# Patient Record
Sex: Female | Born: 1945 | Race: Black or African American | Hispanic: No | Marital: Single | Smoking: Current some day smoker
Health system: Southern US, Community
[De-identification: ages and names within clinical notes are randomized; demographics above are authoritative.]

## PROBLEM LIST (undated history)

## (undated) DIAGNOSIS — G459 Transient cerebral ischemic attack, unspecified: Secondary | ICD-10-CM

## (undated) DIAGNOSIS — E538 Deficiency of other specified B group vitamins: Secondary | ICD-10-CM

## (undated) DIAGNOSIS — D509 Iron deficiency anemia, unspecified: Secondary | ICD-10-CM

## (undated) DIAGNOSIS — N289 Disorder of kidney and ureter, unspecified: Secondary | ICD-10-CM

## (undated) DIAGNOSIS — I739 Peripheral vascular disease, unspecified: Secondary | ICD-10-CM

## (undated) DIAGNOSIS — I4892 Unspecified atrial flutter: Secondary | ICD-10-CM

## (undated) DIAGNOSIS — Z87891 Personal history of nicotine dependence: Secondary | ICD-10-CM

## (undated) DIAGNOSIS — I429 Cardiomyopathy, unspecified: Secondary | ICD-10-CM

## (undated) DIAGNOSIS — K922 Gastrointestinal hemorrhage, unspecified: Secondary | ICD-10-CM

## (undated) DIAGNOSIS — I340 Nonrheumatic mitral (valve) insufficiency: Secondary | ICD-10-CM

## (undated) DIAGNOSIS — I10 Essential (primary) hypertension: Secondary | ICD-10-CM

## (undated) DIAGNOSIS — E785 Hyperlipidemia, unspecified: Secondary | ICD-10-CM

## (undated) DIAGNOSIS — N183 Chronic kidney disease, stage 3 unspecified: Secondary | ICD-10-CM

## (undated) DIAGNOSIS — D649 Anemia, unspecified: Secondary | ICD-10-CM

## (undated) DIAGNOSIS — I208 Other forms of angina pectoris: Secondary | ICD-10-CM

## (undated) DIAGNOSIS — I2089 Other forms of angina pectoris: Secondary | ICD-10-CM

## (undated) DIAGNOSIS — F32A Depression, unspecified: Secondary | ICD-10-CM

## (undated) HISTORY — PX: BREAST REDUCTION SURGERY: SHX8

## (undated) HISTORY — PX: CARDIAC CATHETERIZATION: SHX172

---

## 1989-11-18 DIAGNOSIS — E669 Obesity, unspecified: Secondary | ICD-10-CM | POA: Insufficient documentation

## 1990-08-18 DIAGNOSIS — K209 Esophagitis, unspecified without bleeding: Secondary | ICD-10-CM | POA: Diagnosis present

## 1995-05-01 DIAGNOSIS — E539 Vitamin B deficiency, unspecified: Secondary | ICD-10-CM | POA: Insufficient documentation

## 2001-03-24 DIAGNOSIS — M1990 Unspecified osteoarthritis, unspecified site: Secondary | ICD-10-CM | POA: Insufficient documentation

## 2003-03-29 DIAGNOSIS — I10 Essential (primary) hypertension: Secondary | ICD-10-CM | POA: Diagnosis present

## 2005-07-10 DIAGNOSIS — I739 Peripheral vascular disease, unspecified: Secondary | ICD-10-CM | POA: Diagnosis present

## 2005-08-12 ENCOUNTER — Emergency Department: Payer: Self-pay | Admitting: Emergency Medicine

## 2009-03-15 DIAGNOSIS — D500 Iron deficiency anemia secondary to blood loss (chronic): Secondary | ICD-10-CM | POA: Diagnosis present

## 2009-04-06 DIAGNOSIS — R768 Other specified abnormal immunological findings in serum: Secondary | ICD-10-CM | POA: Insufficient documentation

## 2012-09-14 ENCOUNTER — Encounter: Payer: Self-pay | Admitting: Internal Medicine

## 2012-09-22 ENCOUNTER — Encounter: Payer: Self-pay | Admitting: Internal Medicine

## 2012-10-06 DIAGNOSIS — N62 Hypertrophy of breast: Secondary | ICD-10-CM | POA: Insufficient documentation

## 2012-10-06 DIAGNOSIS — M542 Cervicalgia: Secondary | ICD-10-CM | POA: Insufficient documentation

## 2012-12-01 DIAGNOSIS — J309 Allergic rhinitis, unspecified: Secondary | ICD-10-CM | POA: Insufficient documentation

## 2013-07-27 ENCOUNTER — Emergency Department: Payer: Self-pay | Admitting: Emergency Medicine

## 2014-09-27 ENCOUNTER — Emergency Department: Payer: Medicare PPO

## 2014-09-27 ENCOUNTER — Encounter: Payer: Self-pay | Admitting: Emergency Medicine

## 2014-09-27 ENCOUNTER — Emergency Department
Admission: EM | Admit: 2014-09-27 | Discharge: 2014-09-27 | Disposition: A | Discharge status: Home / Self Care | Payer: Medicare PPO | Attending: Emergency Medicine | Admitting: Emergency Medicine

## 2014-09-27 DIAGNOSIS — R609 Edema, unspecified: Secondary | ICD-10-CM | POA: Diagnosis not present

## 2014-09-27 DIAGNOSIS — Z720 Tobacco use: Secondary | ICD-10-CM | POA: Insufficient documentation

## 2014-09-27 DIAGNOSIS — I10 Essential (primary) hypertension: Secondary | ICD-10-CM | POA: Diagnosis not present

## 2014-09-27 DIAGNOSIS — M79671 Pain in right foot: Secondary | ICD-10-CM | POA: Diagnosis present

## 2014-09-27 HISTORY — DX: Peripheral vascular disease, unspecified: I73.9

## 2014-09-27 HISTORY — DX: Anemia, unspecified: D64.9

## 2014-09-27 HISTORY — DX: Disorder of kidney and ureter, unspecified: N28.9

## 2014-09-27 HISTORY — DX: Essential (primary) hypertension: I10

## 2014-09-27 MED ORDER — TRAMADOL HCL 50 MG PO TABS: 50.0000 mg | ORAL_TABLET | Freq: Four times a day (QID) | ORAL | Status: AC | PRN

## 2014-09-27 NOTE — Discharge Instructions (Signed)
Peripheral Edema °You have swelling in your legs (peripheral edema). This swelling is due to excess accumulation of salt and water in your body. Edema may be a sign of heart, kidney or liver disease, or a side effect of a medication. It may also be due to problems in the leg veins. Elevating your legs and using special support stockings may be very helpful, if the cause of the swelling is due to poor venous circulation. Avoid long periods of standing, whatever the cause. °Treatment of edema depends on identifying the cause. Chips, pretzels, pickles and other salty foods should be avoided. Restricting salt in your diet is almost always needed. Water pills (diuretics) are often used to remove the excess salt and water from your body via urine. These medicines prevent the kidney from reabsorbing sodium. This increases urine flow. °Diuretic treatment may also result in lowering of potassium levels in your body. Potassium supplements may be needed if you have to use diuretics daily. Daily weights can help you keep track of your progress in clearing your edema. You should call your caregiver for follow up care as recommended. °SEEK IMMEDIATE MEDICAL CARE IF:  °· You have increased swelling, pain, redness, or heat in your legs. °· You develop shortness of breath, especially when lying down. °· You develop chest or abdominal pain, weakness, or fainting. °· You have a fever. °Document Released: 04/18/2004 Document Revised: 06/03/2011 Document Reviewed: 03/29/2009 °ExitCare® Patient Information ©2015 [employer]. This information is not intended to replace advice given to you by your health care provider. Make sure you discuss any questions you have with your health care provider. ° °

## 2014-09-27 NOTE — ED Provider Notes (Signed)
[facility] Emergency Department Provider Note  Time seen: 2:56 PM  I have reviewed the triage vital signs and the nursing notes.   HISTORY  Chief Complaint Foot Pain    HPI Jody Lowe is a 69 y.o. female who presents the emergency department with 1 week of right lower extremity swelling and pain. According to the patient for the past one week she has noticed increased swelling to her calf and foot. She is also noted pain in her groin behind her right knee and down her calf. Patient denies any chest pain or shortness of breath. Denies any history of blood clots in the past. The patient does have a history of peripheral artery disease, [facility]. She notes she did have a bypass in the right lower extremity years ago.  Describes her pain as moderate.    Past Medical History  Diagnosis Date  . Hypertension   . Renal disorder   . Peripheral artery disease   . Anemia     There are no active problems to display for this patient.   History reviewed. No pertinent past surgical history.  No current outpatient prescriptions on file.  Allergies Plavix  History reviewed. No pertinent family history.  Social History History  Substance Use Topics  . Smoking status: Current Every Day Smoker  . Smokeless tobacco: Not on file  . Alcohol Use: No    Review of Systems Constitutional: Negative for fever. Cardiovascular: Negative for chest pain. Respiratory: Negative for shortness of breath. Gastrointestinal: Negative for abdominal pain Musculoskeletal: Negative for back pain. Skin: Negative for rash.  10-point ROS otherwise negative.  ____________________________________________   PHYSICAL EXAM:  VITAL SIGNS: ED Triage Vitals  Enc Vitals Group     BP 09/27/14 1417 149/68 mmHg     Pulse Rate 09/27/14 1417 87     Resp 09/27/14 1417 20     Temp 09/27/14 1417 97.5 F (36.4 C)     Temp Source 09/27/14 1417 Oral     SpO2 09/27/14 1417  100 %     Weight 09/27/14 1417 231 lb (104.781 kg)     Height 09/27/14 1417 5\' 6"  (1.676 m)     Head Cir --      Peak Flow --      Pain Score 09/27/14 1410 5     Pain Loc --      Pain Edu? --      Excl. in GC? --     Constitutional: Alert and oriented. Well appearing and in no distress. ENT   Mouth/Throat: Mucous membranes are moist. Cardiovascular: Normal rate, regular rhythm. No murmur Respiratory: Normal respiratory effort without tachypnea nor retractions. Breath sounds are clear and equal bilaterally. No wheezes/rales/rhonchi. Gastrointestinal: Soft and nontender. No distention.   Musculoskeletal: 2+ pedal edema, and right lower extremity edema extending up to the knee. Moderate popliteal fossa tenderness to palpation, mild right inguinal area tenderness to palpation. No erythema noted. Extremity is warm, with normal sensation. Normal color. Neurologic:  Normal speech and language. No gross focal neurologic deficits are appreciated. Speech is normal. Skin:  Skin is warm, dry and intact.  Psychiatric: Mood and affect are normal. Speech and behavior are normal.   ____________________________________________   RADIOLOGY  Ultrasound shows no definitive evidence of DVT.  US Venous Img Lower Unilateral Right (Final result) Result time: 09/27/14 [id number]:24   Final result by Rad Results In Interface (09/27/14 29:56:[id number]:27:24)   Narrative:   CLINICAL DATA: Right  lower extremity edema and pain for 2 weeks.  EXAM: Right LOWER EXTREMITY VENOUS DOPPLER ULTRASOUND  TECHNIQUE: Gray-scale sonography with graded compression, as well as color Doppler and duplex ultrasound were performed to evaluate the lower extremity deep venous systems from the level of the common femoral vein and including the common femoral, femoral, profunda femoral, popliteal and calf veins including the posterior tibial, peroneal and gastrocnemius veins when visible. The superficial great saphenous vein was also  interrogated. Spectral Doppler was utilized to evaluate flow at rest and with distal augmentation maneuvers in the common femoral, femoral and popliteal veins.  COMPARISON: None.  FINDINGS: Contralateral Common Femoral Vein: Respiratory phasicity is normal and symmetric with the symptomatic side. No evidence of thrombus. Normal compressibility.  Common Femoral Vein: No evidence of thrombus. Normal compressibility, respiratory phasicity and response to augmentation.  Saphenofemoral Junction: No evidence of thrombus. Normal compressibility and flow on color Doppler imaging.  Profunda Femoral Vein: No evidence of thrombus. Normal compressibility and flow on color Doppler imaging.  Femoral Vein: No evidence of thrombus. Normal compressibility, respiratory phasicity and response to augmentation.  Popliteal Vein: No evidence of thrombus. Normal compressibility, respiratory phasicity and response to augmentation.  Calf Veins: No evidence of thrombus. Normal compressibility and flow on color Doppler imaging. However, peroneal veins not well visualized.  Superficial Great Saphenous Vein: No evidence of thrombus. Normal compressibility and flow on color Doppler imaging.  Venous Reflux: None.  Other Findings: None.  IMPRESSION: No definite evidence of deep venous thrombosis seen in right lower extremity.      ____________________________________________    INITIAL IMPRESSION / ASSESSMENT AND PLAN / ED COURSE  Pertinent labs & imaging results that were available during my care of the patient were reviewed by me and considered in my medical decision making (see chart for details).  Patient with 1 week of right lower extremity swelling. Concern for possible DVT. We'll proceed with ultrasound. Patient denies any chest pain, tightness, pressure, shortness of breath. Extremity is warm with normal sensation and normal color do not suspect any arterial issue.  Ultrasound shows  no DVT. Patient definitely has swelling to the right lower extremity, no signs of cellulitis. We will place the patient on Ultram as needed for discomfort and have the patient follow-up with her vascular surgeon and primary care doctor this week. The patient is agreeable to this plan. I discussed strict return precautions for worsening pain, swelling, or any chest pain or tightness. The patient is agreeable.  Doppler shows strong pulses on my exam, difficult to palpate a pulse given edema.     ____________________________________________   FINAL CLINICAL IMPRESSION(S) / ED DIAGNOSES  Right lower extremity edema   Minna Antis, MD 09/27/14 1649

## 2014-09-27 NOTE — ED Notes (Signed)
Pt to ed with c/o right foot pain, swelling and pain behind right knee.  Pt states she has PAD,  Called her md and no appt visit.  Pt reports they told her to come to ed and be eval.

## 2014-10-19 DIAGNOSIS — G458 Other transient cerebral ischemic attacks and related syndromes: Secondary | ICD-10-CM | POA: Insufficient documentation

## 2014-10-19 DIAGNOSIS — R42 Dizziness and giddiness: Secondary | ICD-10-CM | POA: Insufficient documentation

## 2015-05-09 DIAGNOSIS — F17200 Nicotine dependence, unspecified, uncomplicated: Secondary | ICD-10-CM | POA: Diagnosis present

## 2016-02-20 DIAGNOSIS — Z9181 History of falling: Secondary | ICD-10-CM | POA: Insufficient documentation

## 2016-06-27 DIAGNOSIS — G252 Other specified forms of tremor: Secondary | ICD-10-CM | POA: Insufficient documentation

## 2016-06-27 DIAGNOSIS — M810 Age-related osteoporosis without current pathological fracture: Secondary | ICD-10-CM | POA: Diagnosis present

## 2016-08-23 DIAGNOSIS — G5600 Carpal tunnel syndrome, unspecified upper limb: Secondary | ICD-10-CM | POA: Insufficient documentation

## 2017-04-21 DIAGNOSIS — M25519 Pain in unspecified shoulder: Secondary | ICD-10-CM | POA: Insufficient documentation

## 2017-04-21 DIAGNOSIS — M549 Dorsalgia, unspecified: Secondary | ICD-10-CM | POA: Insufficient documentation

## 2017-05-08 DIAGNOSIS — R0981 Nasal congestion: Secondary | ICD-10-CM | POA: Insufficient documentation

## 2017-05-08 DIAGNOSIS — H9319 Tinnitus, unspecified ear: Secondary | ICD-10-CM | POA: Insufficient documentation

## 2017-06-23 DIAGNOSIS — E785 Hyperlipidemia, unspecified: Secondary | ICD-10-CM | POA: Insufficient documentation

## 2019-05-01 ENCOUNTER — Emergency Department: Payer: Medicare Other

## 2019-05-01 ENCOUNTER — Encounter: Payer: Self-pay | Admitting: Emergency Medicine

## 2019-05-01 ENCOUNTER — Observation Stay: Payer: Medicare Other

## 2019-05-01 ENCOUNTER — Other Ambulatory Visit: Payer: Self-pay

## 2019-05-01 ENCOUNTER — Observation Stay
Admission: EM | Admit: 2019-05-01 | Discharge: 2019-05-03 | Disposition: A | Discharge status: Home / Self Care | Payer: Medicare Other | Attending: Internal Medicine | Admitting: Internal Medicine

## 2019-05-01 DIAGNOSIS — Z20822 Contact with and (suspected) exposure to covid-19: Secondary | ICD-10-CM | POA: Diagnosis not present

## 2019-05-01 DIAGNOSIS — F329 Major depressive disorder, single episode, unspecified: Secondary | ICD-10-CM | POA: Diagnosis not present

## 2019-05-01 DIAGNOSIS — F17200 Nicotine dependence, unspecified, uncomplicated: Secondary | ICD-10-CM | POA: Diagnosis not present

## 2019-05-01 DIAGNOSIS — D500 Iron deficiency anemia secondary to blood loss (chronic): Secondary | ICD-10-CM | POA: Diagnosis not present

## 2019-05-01 DIAGNOSIS — Z791 Long term (current) use of non-steroidal anti-inflammatories (NSAID): Secondary | ICD-10-CM | POA: Insufficient documentation

## 2019-05-01 DIAGNOSIS — R2689 Other abnormalities of gait and mobility: Secondary | ICD-10-CM | POA: Insufficient documentation

## 2019-05-01 DIAGNOSIS — E876 Hypokalemia: Secondary | ICD-10-CM | POA: Diagnosis present

## 2019-05-01 DIAGNOSIS — G459 Transient cerebral ischemic attack, unspecified: Principal | ICD-10-CM | POA: Diagnosis present

## 2019-05-01 DIAGNOSIS — Z7982 Long term (current) use of aspirin: Secondary | ICD-10-CM | POA: Insufficient documentation

## 2019-05-01 DIAGNOSIS — Z79899 Other long term (current) drug therapy: Secondary | ICD-10-CM | POA: Insufficient documentation

## 2019-05-01 DIAGNOSIS — I639 Cerebral infarction, unspecified: Secondary | ICD-10-CM | POA: Diagnosis present

## 2019-05-01 DIAGNOSIS — I10 Essential (primary) hypertension: Secondary | ICD-10-CM | POA: Diagnosis not present

## 2019-05-01 DIAGNOSIS — I739 Peripheral vascular disease, unspecified: Secondary | ICD-10-CM | POA: Diagnosis not present

## 2019-05-01 LAB — CBC
HCT: 35.9 % — ABNORMAL LOW (ref 36.0–46.0)
HCT: 32 % — ABNORMAL LOW (ref 36.0–46.0)
Hemoglobin: 11.5 g/dL — ABNORMAL LOW (ref 12.0–15.0)
Hemoglobin: 10.6 g/dL — ABNORMAL LOW (ref 12.0–15.0)
MCH: 27.5 pg (ref 26.0–34.0)
MCH: 27.9 pg (ref 26.0–34.0)
MCHC: 32 g/dL (ref 30.0–36.0)
MCHC: 33.1 g/dL (ref 30.0–36.0)
MCV: 85.9 fL (ref 80.0–100.0)
MCV: 84.2 fL (ref 80.0–100.0)
Platelets: 497 10*3/uL — ABNORMAL HIGH (ref 150–400)
Platelets: 459 10*3/uL — ABNORMAL HIGH (ref 150–400)
RBC: 4.18 MIL/uL (ref 3.87–5.11)
RBC: 3.8 MIL/uL — ABNORMAL LOW (ref 3.87–5.11)
RDW: 14.8 % (ref 11.5–15.5)
RDW: 14.3 % (ref 11.5–15.5)
WBC: 5.9 10*3/uL (ref 4.0–10.5)
WBC: 6.5 10*3/uL (ref 4.0–10.5)
nRBC: 0 % (ref 0.0–0.2)
nRBC: 0 % (ref 0.0–0.2)

## 2019-05-01 LAB — URINE DRUG SCREEN, QUALITATIVE (ARMC ONLY)
Amphetamines, Ur Screen: NOT DETECTED
Barbiturates, Ur Screen: NOT DETECTED
Benzodiazepine, Ur Scrn: NOT DETECTED
Cannabinoid 50 Ng, Ur ~~LOC~~: NOT DETECTED
Cocaine Metabolite,Ur ~~LOC~~: NOT DETECTED
MDMA (Ecstasy)Ur Screen: NOT DETECTED
Methadone Scn, Ur: NOT DETECTED
Opiate, Ur Screen: NOT DETECTED
Phencyclidine (PCP) Ur S: NOT DETECTED
Tricyclic, Ur Screen: POSITIVE — AB

## 2019-05-01 LAB — COMPREHENSIVE METABOLIC PANEL
ALT: 8 U/L (ref 0–44)
AST: 10 U/L — ABNORMAL LOW (ref 15–41)
Albumin: 2.7 g/dL — ABNORMAL LOW (ref 3.5–5.0)
Alkaline Phosphatase: 86 U/L (ref 38–126)
Anion gap: 8 (ref 5–15)
BUN: 10 mg/dL (ref 8–23)
CO2: 21 mmol/L — ABNORMAL LOW (ref 22–32)
Calcium: 7.5 mg/dL — ABNORMAL LOW (ref 8.9–10.3)
Chloride: 107 mmol/L (ref 98–111)
Creatinine, Ser: 0.97 mg/dL (ref 0.44–1.00)
GFR calc Af Amer: >60 mL/min (ref >60)
GFR calc non Af Amer: 57 mL/min — ABNORMAL LOW (ref >60)
Glucose, Bld: 78 mg/dL (ref 70–99)
Potassium: 2.7 mmol/L — CL (ref 3.5–5.1)
Sodium: 136 mmol/L (ref 135–145)
Total Bilirubin: 0.5 mg/dL (ref 0.3–1.2)
Total Protein: 5.6 g/dL — ABNORMAL LOW (ref 6.5–8.1)

## 2019-05-01 LAB — CREATININE, SERUM
Creatinine, Ser: 0.92 mg/dL (ref 0.44–1.00)
GFR calc Af Amer: >60 mL/min (ref >60)
GFR calc non Af Amer: >60 mL/min (ref >60)

## 2019-05-01 LAB — TROPONIN I (HIGH SENSITIVITY)
Troponin I (High Sensitivity): 5 ng/L (ref <18)
Troponin I (High Sensitivity): 3 ng/L (ref <18)
Troponin I (High Sensitivity): 4 ng/L (ref <18)

## 2019-05-01 LAB — URINALYSIS, ROUTINE W REFLEX MICROSCOPIC
Bilirubin Urine: NEGATIVE
Glucose, UA: NEGATIVE mg/dL
Hgb urine dipstick: NEGATIVE
Ketones, ur: NEGATIVE mg/dL
Leukocytes,Ua: NEGATIVE
Nitrite: NEGATIVE
Protein, ur: NEGATIVE mg/dL
Specific Gravity, Urine: 1.005 (ref 1.005–1.030)
pH: 7 (ref 5.0–8.0)

## 2019-05-01 LAB — APTT: aPTT: 33 seconds (ref 24–36)

## 2019-05-01 LAB — DIFFERENTIAL
Abs Immature Granulocytes: 0.03 10*3/uL (ref 0.00–0.07)
Basophils Absolute: 0.1 10*3/uL (ref 0.0–0.1)
Basophils Relative: 1 %
Eosinophils Absolute: 0.2 10*3/uL (ref 0.0–0.5)
Eosinophils Relative: 3 %
Immature Granulocytes: 1 %
Lymphocytes Relative: 20 %
Lymphs Abs: 1.2 10*3/uL (ref 0.7–4.0)
Monocytes Absolute: 0.4 10*3/uL (ref 0.1–1.0)
Monocytes Relative: 7 %
Neutro Abs: 4 10*3/uL (ref 1.7–7.7)
Neutrophils Relative %: 68 %

## 2019-05-01 LAB — MAGNESIUM: Magnesium: 1.6 mg/dL — ABNORMAL LOW (ref 1.7–2.4)

## 2019-05-01 LAB — PROTIME-INR
INR: 1.1 (ref 0.8–1.2)
Prothrombin Time: 13.6 seconds (ref 11.4–15.2)

## 2019-05-01 LAB — ETHANOL: Alcohol, Ethyl (B): <10 mg/dL (ref <10)

## 2019-05-01 MED ORDER — ROSUVASTATIN CALCIUM 20 MG PO TABS
40.0000 mg | ORAL_TABLET | Freq: Every day | ORAL | Status: DC
  Administered 2019-05-01 – 2019-05-02 (×2): 40 mg via ORAL
  Filled 2019-05-01 (×2): qty 2
  Filled 2019-05-01: qty 4
  Filled 2019-05-01: qty 2
  Filled 2019-05-01: qty 4

## 2019-05-01 MED ORDER — MAGNESIUM SULFATE IN D5W 1-5 GM/100ML-% IV SOLN
1.0000 g | Freq: Once | INTRAVENOUS | Status: AC
  Administered 2019-05-02: 1 g via INTRAVENOUS
  Filled 2019-05-01: qty 100

## 2019-05-01 MED ORDER — STROKE: EARLY STAGES OF RECOVERY BOOK: Freq: Once | Status: AC

## 2019-05-01 MED ORDER — HEPARIN SODIUM (PORCINE) 5000 UNIT/ML IJ SOLN
5000.0000 [IU] | Freq: Three times a day (TID) | INTRAMUSCULAR | Status: DC
  Administered 2019-05-01 – 2019-05-03 (×6): 5000 [IU] via SUBCUTANEOUS
  Filled 2019-05-01 (×6): qty 1

## 2019-05-01 MED ORDER — POTASSIUM CHLORIDE 10 MEQ/100ML IV SOLN
10.0000 meq | Freq: Once | INTRAVENOUS | Status: AC
  Administered 2019-05-01: 10 meq via INTRAVENOUS
  Filled 2019-05-01: qty 100

## 2019-05-01 MED ORDER — ACETAMINOPHEN 325 MG PO TABS
650.0000 mg | ORAL_TABLET | ORAL | Status: DC | PRN
  Administered 2019-05-02 (×2): 650 mg via ORAL
  Filled 2019-05-01 (×2): qty 2

## 2019-05-01 MED ORDER — ACETAMINOPHEN 650 MG RE SUPP: 650.0000 mg | RECTAL | Status: DC | PRN

## 2019-05-01 MED ORDER — SODIUM CHLORIDE 0.9 % IV SOLN: INTRAVENOUS | Status: DC

## 2019-05-01 MED ORDER — PANTOPRAZOLE SODIUM 40 MG IV SOLR
40.0000 mg | INTRAVENOUS | Status: DC
  Administered 2019-05-01 – 2019-05-02 (×2): 40 mg via INTRAVENOUS
  Filled 2019-05-01 (×2): qty 40

## 2019-05-01 MED ORDER — ACETAMINOPHEN 160 MG/5ML PO SOLN
650.0000 mg | ORAL | Status: DC | PRN
  Filled 2019-05-01: qty 20.3

## 2019-05-01 MED ORDER — SODIUM CHLORIDE 0.9 % IV SOLN
INTRAVENOUS | Status: DC | PRN
  Administered 2019-05-01 – 2019-05-02 (×2): 250 mL via INTRAVENOUS

## 2019-05-01 MED ORDER — ASPIRIN EC 81 MG PO TBEC
81.0000 mg | DELAYED_RELEASE_TABLET | Freq: Every day | ORAL | Status: DC
  Administered 2019-05-01 – 2019-05-03 (×3): 81 mg via ORAL
  Filled 2019-05-01 (×3): qty 1

## 2019-05-01 NOTE — Progress Notes (Signed)
PT Cancellation Note  Patient Details Name: [name] MRN: [id number] DOB: [birth year]   Cancelled Treatment:    Reason Eval/Treat Not Completed: Patient not medically ready(PT order received, chart reviewed, PT held due to potassium being critical low 2.7. Will re-attempt eval at later time/date when patient is medically appropiate.)  Luretha Murphy. Ilsa Iha, PT, DPT 05/01/19, 3:02 PM

## 2019-05-01 NOTE — Assessment & Plan Note (Addendum)
Pt needs medical optimization with tobacco cessation and lipid panel eval and antiplatelet therapy in addition to asa as she is allergic to plavix. . Plan upon discharge plan.

## 2019-05-01 NOTE — Plan of Care (Signed)
Pt has no deficits currently and is planning on stop smoking. Will continue to monitor.

## 2019-05-01 NOTE — ED Triage Notes (Signed)
First Nurse: Pt to registration via wheelchair with reports of "my left hand will not do what my mind is telling it to do".  Pt reports symptoms started on Monday.  Pt also reports trouble with her balance and a headache, also starting on Monday.

## 2019-05-01 NOTE — Assessment & Plan Note (Signed)
counseled pt extensively on tobacco cessation and ongoing risk esp with her health history of PAD. Pt to cont asa and statin therapy.

## 2019-05-01 NOTE — Assessment & Plan Note (Signed)
Pt denies any bleeding and has anemia history as follow.  Most Recent 05/01/2019 05/01/2019               Cbc   HCT 32.0 (05/01/2019) 32.0 35.9     Hemoglobin 10.6 (05/01/2019) 10.6 11.5     MCV 84.2 (05/01/2019) 84.2 85.9     RDW 14.3 (05/01/2019) 14.3 14.8          We will start iv ppi and guaiac stools.

## 2019-05-01 NOTE — ED Notes (Signed)
RN is waiting for medication from pharmacy to be verified and sent to ED.

## 2019-05-01 NOTE — H&P (Signed)
History and Physical    MARKETA MIDKIFF MVE:[id number] DOB: 05-24-1945 DOA: 05/01/2019   Patient coming from: Home / has pcp  Chief Complaint: left forearm weakness.  HPI: Jody Lowe is a 74 y.o. female with medical history significant of HTN, anemia, tobacco abuse and PAD seen in ed for left forearm weakness that started on Monday and since then has remained the same. No variation pattern or worsening.  The patient woke up with this symptom and felt that she probably slept on her arm wrong, but has persisted since then.  She feels like her hand will not do what she wanted to do.  She denies any other weakness, any numbness, difficulty with speaking or finding words, or any facial droop.  She has no prior history of a stroke.  ED Course:  Blood pressure (!) 159/69, pulse 75, temperature 98.2 F (36.8 C), temperature source Oral, resp. rate 18, height 5\' 6"  (1.676 m), weight 86.2 kg, SpO2 100 %. Pt is alert awake and oriented and gives her history , she lives at home.   Review of Systems: As per HPI otherwise 10 point review of systems negative.   Past Medical History:  Diagnosis Date  . Anemia   . Hypertension   . Peripheral artery disease (HCC)   . Renal disorder     History reviewed. No pertinent surgical history.   reports that she has been smoking. She does not have any smokeless tobacco history on file. She reports that she does not drink alcohol or use drugs.  Allergies  Allergen Reactions  . Gabapentin Other (See Comments)    Nervous, shaky  . Plavix [Clopidogrel Bisulfate] Rash    Family History: Dad: cancer history. Mom: heart problems.   Prior to Admission medications   Not on File    Physical Exam: Vitals:   05/01/19 1400 05/01/19 1430 05/01/19 1500 05/01/19 1541  BP: (!) 149/64 (!) 120/52 (!) 160/63 (!) 159/69  Pulse: 66 74 73 75  Resp: 18 18 18 18   Temp:    98.2 F (36.8 C)  TempSrc:    Oral  SpO2: 97% 100% 100% 100%  Weight:      Height:            Constitutional: NAD, calm, comfortable Vitals:   05/01/19 1400 05/01/19 1430 05/01/19 1500 05/01/19 1541  BP: (!) 149/64 (!) 120/52 (!) 160/63 (!) 159/69  Pulse: 66 74 73 75  Resp: 18 18 18 18   Temp:    98.2 F (36.8 C)  TempSrc:    Oral  SpO2: 97% 100% 100% 100%  Weight:      Height:       Eyes: PERRL, lids and conjunctivae normal ENMT: Mucous membranes are moist. Posterior pharynx clear of any exudate or lesions.Normal dentition.  Neck: normal, supple, no masses, no thyromegaly Respiratory: clear to auscultation bilaterally, no wheezing, no crackles. Normal respiratory effort. No accessory muscle use.  Cardiovascular: Regular rate and rhythm, no murmurs / rubs / gallops. No extremity edema. 2+ pedal pulses. No carotid bruits.  Abdomen: no tenderness, no masses palpated. No hepatosplenomegaly. Bowel sounds positive.  Musculoskeletal: no clubbing / cyanosis. No joint deformity upper and lower extremities. Good ROM, no contractures. Normal muscle tone.  Skin: no rashes, lesions, ulcers. No induration Neurologic: CN 2-12 grossly intact. Sensation intact, DTR normal. Strength 5/5 in all 4.  Psychiatric: Normal judgment and insight. Alert and oriented x 3. Normal mood.   Labs on Admission: I have personally reviewed  following labs and imaging studies  CBC: Recent Labs  Lab 05/01/19 1155 05/01/19 1407  WBC 5.9 6.5  NEUTROABS 4.0  --   HGB 11.5* 10.6*  HCT 35.9* 32.0*  MCV 85.9 84.2  PLT 497* 161*   Basic Metabolic Panel: Recent Labs  Lab 05/01/19 1315 05/01/19 1407  NA 136  --   K 2.7*  --   CL 107  --   CO2 21*  --   GLUCOSE 78  --   BUN 10  --   CREATININE 0.97 0.92  CALCIUM 7.5*  --   MG  --  1.6*   GFR: Estimated Creatinine Clearance: 58.5 mL/min (by C-G formula based on SCr of 0.92 mg/dL). Liver Function Tests: Recent Labs  Lab 05/01/19 1315  AST 10*  ALT 8  ALKPHOS 86  BILITOT 0.5  PROT 5.6*  ALBUMIN 2.7*   No results for input(s): LIPASE,  AMYLASE in the last 168 hours. No results for input(s): AMMONIA in the last 168 hours. Coagulation Profile: Recent Labs  Lab 05/01/19 1315  INR 1.1   Cardiac Enzymes: No results for input(s): CKTOTAL, CKMB, CKMBINDEX, TROPONINI in the last 168 hours. BNP (last 3 results) No results for input(s): PROBNP in the last 8760 hours. HbA1C: No results for input(s): HGBA1C in the last 72 hours. CBG: No results for input(s): GLUCAP in the last 168 hours. Lipid Profile: No results for input(s): CHOL, HDL, LDLCALC, TRIG, CHOLHDL, LDLDIRECT in the last 72 hours. Thyroid Function Tests: No results for input(s): TSH, T4TOTAL, FREET4, T3FREE, THYROIDAB in the last 72 hours. Anemia Panel: No results for input(s): VITAMINB12, FOLATE, FERRITIN, TIBC, IRON, RETICCTPCT in the last 72 hours. Urine analysis:    Component Value Date/Time   COLORURINE YELLOW (A) 05/01/2019 1317   APPEARANCEUR CLEAR (A) 05/01/2019 1317   LABSPEC 1.005 05/01/2019 1317   PHURINE 7.0 05/01/2019 1317   GLUCOSEU NEGATIVE 05/01/2019 1317   HGBUR NEGATIVE 05/01/2019 1317   [place] 05/01/2019 1317   KETONESUR NEGATIVE 05/01/2019 1317   PROTEINUR NEGATIVE 05/01/2019 1317   NITRITE NEGATIVE 05/01/2019 1317   LEUKOCYTESUR NEGATIVE 05/01/2019 1317   Sepsis Labs: @LABRCNTIP (procalcitonin:4,lacticidven:4) )No results found for this or any previous visit (from the past 240 hour(s)).   Radiological Exams on Admission: CT HEAD WO CONTRAST  Result Date: 05/01/2019 CLINICAL DATA:  74 year old female with left-sided weakness for greater than 6 hours. Stroke suspected. EXAM: CT HEAD WITHOUT CONTRAST TECHNIQUE: Contiguous axial images were obtained from the base of the skull through the vertex without intravenous contrast. COMPARISON:  None. FINDINGS: Brain: No evidence of acute infarction, hemorrhage, hydrocephalus, extra-axial collection or mass lesion/mass effect. Mild periventricular white matter hypoattenuation  consistent with chronic microvascular ischemic white matter disease. Trace mineralization present in the bilateral globus pallidus. Vascular: No hyperdense vessel or unexpected calcification. Skull: Normal. Negative for fracture or focal lesion. Sinuses/Orbits: No acute finding. 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 Other: None. IMPRESSION: 1. No acute intracranial abnormality. 2. Mild chronic microvascular ischemic white matter changes. Electronically Signed   By: Jacqulynn Cadet M.D.   On: 05/01/2019 12:58    EKG:   Assessment/Plan Hypertension, benign Assessment & Plan Pt's home regimen of bp meds held for permissive htn. We will follow.   Peripheral vascular disease ([place]) Assessment & Plan Pt needs medical optimization with tobacco cessation and lipid panel eval and antiplatelet therapy in addition to asa as she is allergic to plavix. . Plan upon discharge plan.    TIA (  transient ischemic attack) Assessment & Plan Per pt she developed left forearm weakness nonradiating . Neuro exam is normal. Pt is continued on her asa 28 / started on crestor 40 mg and admitted for tia/cva workup: Mri/carotid dopplers/ echo.      Pt passed speech screen and cardiac diet started.   Iron deficiency anemia due to chronic blood loss Assessment & Plan Pt denies any bleeding and has anemia history as follow.  Most Recent 05/01/2019 05/01/2019               Cbc   HCT 32.0 (05/01/2019) 32.0 35.9     Hemoglobin 10.6 (05/01/2019) 10.6 11.5     MCV 84.2 (05/01/2019) 84.2 85.9     RDW 14.3 (05/01/2019) 14.3 14.8          We will start iv ppi and guaiac stools.    Tobacco use disorder Assessment & Plan counseled pt extensively on tobacco cessation and ongoing risk esp with her health history of PAD. Pt to cont asa and statin therapy.    DVT prophylaxis: Heparin Code Status: full code Family Communication: None at bedside Disposition Plan: Home Consults called: None Admission status:  Observation.  Gertha Calkin MD [facility] If 7PM-7AM, please contact night-coverage [email or web address] Password [email or web address]  05/01/2019, 5:00 PM

## 2019-05-01 NOTE — Assessment & Plan Note (Addendum)
Per pt she developed left forearm weakness nonradiating . Neuro exam is normal. Pt is continued on her asa 40 / started on crestor 40 mg and admitted for tia/cva workup: Mri/carotid dopplers/ echo.

## 2019-05-01 NOTE — ED Notes (Signed)
Patient transported to CT 

## 2019-05-01 NOTE — ED Triage Notes (Signed)
Pt referred to ED by Urgent Care for left sided weakness that started on Monday. Pt's has left side weakness apparent in leg and arm/hand.

## 2019-05-01 NOTE — Assessment & Plan Note (Signed)
Pt's home regimen of bp meds held for permissive htn. We will follow.

## 2019-05-01 NOTE — Plan of Care (Signed)
Pt admitted today from the ED.  VSS.  A&Ox4.  NIH 0. Slight weakness noted to L hand grip. Serum K+ 2.7 in the ED. Potassium IV initiated.

## 2019-05-01 NOTE — ED Provider Notes (Signed)
[facility] Emergency Department Provider Note ____________________________________________   First MD Initiated Contact with Patient 05/01/19 1204     (approximate)  I have reviewed the triage vital signs and the nursing notes.   HISTORY  Chief Complaint Weakness    HPI Jody Lowe is a 74 y.o. female with PMH as noted below who presents with left upper extremity weakness and difficulty with coordination, acute onset 5 days ago, and persisting since then.  The patient woke up with this symptom and felt that she probably slept on her arm wrong, but has persisted since then.  She feels like her hand will not do what she wanted to do.  She denies any other weakness, any numbness, difficulty with speaking or finding words, or any facial droop.  She has no prior history of a stroke.  Past Medical History:  Diagnosis Date  . Anemia   . Hypertension   . Peripheral artery disease ([place])   . Renal disorder     Patient Active Problem List   Diagnosis Date Noted  . TIA (transient ischemic attack) 05/01/2019  . Tobacco use disorder 05/09/2015  . Iron deficiency anemia due to chronic blood loss 03/15/2009  . Peripheral vascular disease ([place]) 07/10/2005  . Hypertension, benign 03/29/2003  . Depressive disorder 10/06/2001    History reviewed. No pertinent surgical history.  Prior to Admission medications   Not on File    Allergies Gabapentin and Plavix [clopidogrel bisulfate]  No family history on file.  Social History Social History   Tobacco Use  . Smoking status: Current Every Day Smoker  Substance Use Topics  . Alcohol use: No  . Drug use: No    Review of Systems  Constitutional: No fever. Eyes: No visual changes. ENT: No sore throat. Cardiovascular: Denies chest pain. Respiratory: Denies shortness of breath. Gastrointestinal: No vomiting or diarrhea.  Genitourinary: Negative for dysuria.  Musculoskeletal: Negative for back  pain. Skin: Negative for rash. Neurological: Negative for headaches, positive for left arm weakness.   ____________________________________________   PHYSICAL EXAM:  VITAL SIGNS: ED Triage Vitals  Enc Vitals Group     BP 05/01/19 1137 (!) 124/59     Pulse Rate 05/01/19 1137 76     Resp 05/01/19 1137 18     Temp 05/01/19 1137 98.7 F (37.1 C)     Temp src --      SpO2 05/01/19 1137 100 %     Weight 05/01/19 1139 190 lb (86.2 kg)     Height 05/01/19 1139 5\' 6"  (1.676 m)     Head Circumference --      Peak Flow --      Pain Score 05/01/19 1139 0     Pain Loc --      Pain Edu? --      Excl. in [place]? --     Constitutional: Alert and oriented. Well appearing and in no acute distress. Eyes: Conjunctivae are normal.  EOMI.  PERRLA. Head: Atraumatic. Nose: No congestion/rhinnorhea. Mouth/Throat: Mucous membranes are moist.   Neck: Normal range of motion.  Cardiovascular: Normal rate, regular rhythm.  Good peripheral circulation. Respiratory: Normal respiratory effort.  No retractions.  Gastrointestinal:  No distention.  Musculoskeletal: No lower extremity edema.  Extremities warm and well perfused.  Neurologic:  Normal speech and language.  No facial droop.  4/5 motor strength to left upper extremity with mild ataxia and drift.  No other extremity weakness. Skin:  Skin is warm and dry. No  rash noted. Psychiatric: Mood and affect are normal. Speech and behavior are normal.  ____________________________________________   LABS (all labs ordered are listed, but only abnormal results are displayed)  Labs Reviewed  CBC - Abnormal; Notable for the following components:      Result Value   Hemoglobin 11.5 (*)    HCT 35.9 (*)    Platelets 497 (*)    All other components within normal limits  URINE DRUG SCREEN, QUALITATIVE ([facility] ONLY) - Abnormal; Notable for the following components:   Tricyclic, Ur Screen POSITIVE (*)    All other components within normal limits  COMPREHENSIVE  METABOLIC PANEL - Abnormal; Notable for the following components:   Potassium 2.7 (*)    CO2 21 (*)    Calcium 7.5 (*)    Total Protein 5.6 (*)    Albumin 2.7 (*)    AST 10 (*)    GFR calc non Af Amer 57 (*)    All other components within normal limits  ETHANOL  DIFFERENTIAL  PROTIME-INR  APTT  URINALYSIS, ROUTINE W REFLEX MICROSCOPIC  MAGNESIUM  CBC  CREATININE, SERUM  TROPONIN I (HIGH SENSITIVITY)   ____________________________________________  EKG  ED ECG REPORT I, Dionne Bucy, the attending physician, personally viewed and interpreted this ECG.  Date: 05/01/2019 EKG Time: 1215 Rate: 66 Rhythm: normal sinus rhythm QRS Axis: normal Intervals: normal ST/T Wave abnormalities: normal Narrative Interpretation: no evidence of acute ischemia  ____________________________________________  RADIOLOGY  CT head: No acute abnormality ____________________________________________   PROCEDURES  Procedure(s) performed: No  Procedures  Critical Care performed: No ____________________________________________   INITIAL IMPRESSION / ASSESSMENT AND PLAN / ED COURSE  Pertinent labs & imaging results that were available during my care of the patient were reviewed by me and considered in my medical decision making (see chart for details).  74 year old female with PMH as noted above but no prior stroke history presents with acute onset of left upper extremity weakness and difficulty with coordination, but which started 5 days ago and has persisted since then.  The patient denies any other acute neurologic symptoms.  On exam she is overall relatively well-appearing.  Her vital signs are normal.  The physical exam is unremarkable except for mild ataxia, drift, and weakness to the left upper extremity.  The neuro exam is otherwise nonfocal.  Presentation is concerning for an acute CVA 5 days ago.  The patient is out of the window for any emergent intervention.  We will  obtain a CT, lab work-up, and reassess.  ----------------------------------------- 2:19 PM on 05/01/2019 -----------------------------------------  CT shows no acute findings.  Lab work-up is notable for hypokalemia.  Given that the patient continues to have left-sided weakness, she will need admission for a stroke work-up.  I discussed the case with Dr. Allena Katz from the hospitalist service.  ___________________________  [name] was evaluated in Emergency Department on 05/01/2019 for the symptoms described in the history of present illness. She was evaluated in the context of the global COVID-19 pandemic, which necessitated consideration that the patient might be at risk for infection with the SARS-CoV-2 virus that causes COVID-19. Institutional protocols and algorithms that pertain to the evaluation of patients at risk for COVID-19 are in a state of rapid change based on information released by regulatory bodies including the CDC and federal and state organizations. These policies and algorithms were followed during the patient's care in the ED. ____________________________________________   FINAL CLINICAL IMPRESSION(S) / ED DIAGNOSES  Final diagnoses:  Hypokalemia  Cerebrovascular accident (CVA), unspecified mechanism (HCC)      NEW MEDICATIONS STARTED DURING THIS VISIT:  New Prescriptions   No medications on file     Note:  This document was prepared using Dragon voice recognition software and may include unintentional dictation errors.    Dionne Bucy, MD 05/01/19 1419

## 2019-05-02 ENCOUNTER — Observation Stay: Admit: 2019-05-02 | Payer: Medicare Other

## 2019-05-02 DIAGNOSIS — G459 Transient cerebral ischemic attack, unspecified: Secondary | ICD-10-CM | POA: Diagnosis not present

## 2019-05-02 LAB — LIPID PANEL
Cholesterol: 109 mg/dL (ref 0–200)
HDL: 34 mg/dL — ABNORMAL LOW (ref >40)
LDL Cholesterol: 59 mg/dL (ref 0–99)
Total CHOL/HDL Ratio: 3.2 RATIO
Triglycerides: 81 mg/dL (ref <150)
VLDL: 16 mg/dL (ref 0–40)

## 2019-05-02 LAB — BASIC METABOLIC PANEL
Anion gap: 7 (ref 5–15)
BUN: 9 mg/dL (ref 8–23)
CO2: 23 mmol/L (ref 22–32)
Calcium: 8.5 mg/dL — ABNORMAL LOW (ref 8.9–10.3)
Chloride: 104 mmol/L (ref 98–111)
Creatinine, Ser: 1.08 mg/dL — ABNORMAL HIGH (ref 0.44–1.00)
GFR calc Af Amer: 58 mL/min — ABNORMAL LOW (ref >60)
GFR calc non Af Amer: 50 mL/min — ABNORMAL LOW (ref >60)
Glucose, Bld: 109 mg/dL — ABNORMAL HIGH (ref 70–99)
Potassium: 3.3 mmol/L — ABNORMAL LOW (ref 3.5–5.1)
Sodium: 134 mmol/L — ABNORMAL LOW (ref 135–145)

## 2019-05-02 LAB — SARS CORONAVIRUS 2 (TAT 6-24 HRS): SARS Coronavirus 2: NEGATIVE

## 2019-05-02 LAB — HEMOGLOBIN A1C
Hgb A1c MFr Bld: 5.6 % (ref 4.8–5.6)
Mean Plasma Glucose: 114.02 mg/dL

## 2019-05-02 MED ORDER — POTASSIUM CHLORIDE CRYS ER 20 MEQ PO TBCR
40.0000 meq | EXTENDED_RELEASE_TABLET | Freq: Once | ORAL | Status: AC
  Administered 2019-05-02: 40 meq via ORAL
  Filled 2019-05-02: qty 2

## 2019-05-02 MED ORDER — OXYCODONE HCL 5 MG PO TABS
10.0000 mg | ORAL_TABLET | Freq: Four times a day (QID) | ORAL | Status: DC | PRN
  Administered 2019-05-02: 10 mg via ORAL
  Filled 2019-05-02 (×2): qty 2

## 2019-05-02 NOTE — Progress Notes (Signed)
Physical Therapy Evaluation Patient Details Name: Jody Lowe MRN: [id number] DOB: June 01, 1945 Today's Date: 05/02/2019   History of Present Illness  Per MD note:Jody Lowe is a 74 y.o. female with medical history significant of HTN, anemia, tobacco abuse and PAD seen in ed for left forearm weakness that started on Monday and since then has remained the same. No variation pattern or worsening.  Clinical Impression  Patient agrees to PT eval. She has 3/5 strength BLE . She requires MI for bed mobility. She has good sitting balance and fair standing balance with RW. She transfers sit to stand with RW and min assist.  She is able to ambulate 300 feet with RW and supervision. She plans to go home with her sister. Patient will benefit from skilled PT to improve mobility and strength.     Follow Up Recommendations Home health PT    Equipment Recommendations  None recommended by PT    Recommendations for Other Services       Precautions / Restrictions Precautions Precautions: Fall Restrictions Weight Bearing Restrictions: No      Mobility  Bed Mobility Overal bed mobility: Needs Assistance Bed Mobility: Supine to Sit;Sit to Supine     Supine to sit: Supervision Sit to supine: Supervision      Transfers Overall transfer level: Needs assistance Equipment used: Rolling walker (2 wheeled) Transfers: Sit to/from Stand Sit to Stand: Min assist         General transfer comment: hand placement  Ambulation/Gait Ambulation/Gait assistance: Supervision Gait Distance (Feet): 300 Feet Assistive device: Rolling walker (2 wheeled) Gait Pattern/deviations: Step-to pattern        Stairs            Wheelchair Mobility    Modified Rankin (Stroke Patients Only)       Balance Overall balance assessment: Needs assistance Sitting-balance support: Bilateral upper extremity supported Sitting balance-Leahy Scale: Good     Standing balance support: Bilateral upper  extremity supported Standing balance-Leahy Scale: Fair                               Pertinent Vitals/Pain Pain Assessment: 0-10 Pain Score: 2  Pain Location: (ankles)    Home Living Family/patient expects to be discharged to:: Private residence Living Arrangements: Alone Available Help at Discharge: Family Type of Home: House Home Access: Stairs to enter Entrance Stairs-Rails: Right Entrance Stairs-Number of Steps: 1 Home Layout: One level        Prior Function Level of Independence: Independent with assistive device(s)               Hand Dominance        Extremity/Trunk Assessment        Lower Extremity Assessment Lower Extremity Assessment: Generalized weakness       Communication   Communication: No difficulties  Cognition Arousal/Alertness: Awake/alert Behavior During Therapy: WFL for tasks assessed/performed Overall Cognitive Status: Within Functional Limits for tasks assessed                                        General Comments      Exercises     Assessment/Plan    PT Assessment Patient needs continued PT services  PT Problem List Decreased strength;Decreased activity tolerance;Decreased balance;Decreased mobility       PT Treatment Interventions Gait training;Therapeutic activities;Therapeutic exercise;Balance  training    PT Goals (Current goals can be found in the Care Plan section)  Acute Rehab PT Goals Patient Stated Goal: to walk better PT Goal Formulation: Patient unable to participate in goal setting Time For Goal Achievement: 05/16/19 Potential to Achieve Goals: Fair    Frequency Min 2X/week   Barriers to discharge        Co-evaluation               AM-PAC PT "6 Clicks" Mobility  Outcome Measure Help needed turning from your back to your side while in a flat bed without using bedrails?: A Little Help needed moving from lying on your back to sitting on the side of a flat bed  without using bedrails?: A Little Help needed moving to and from a bed to a chair (including a wheelchair)?: A Little Help needed standing up from a chair using your arms (e.g., wheelchair or bedside chair)?: A Little Help needed to walk in hospital room?: A Little Help needed climbing 3-5 steps with a railing? : A Little 6 Click Score: 18    End of Session Equipment Utilized During Treatment: Gait belt Activity Tolerance: Patient tolerated treatment well Patient left: in bed;with bed alarm set Nurse Communication: Mobility status PT Visit Diagnosis: Unsteadiness on feet (R26.81);Other abnormalities of gait and mobility (R26.89);Muscle weakness (generalized) (M62.81);Difficulty in walking, not elsewhere classified (R26.2)    Time: 4540-9811 PT Time Calculation (min) (ACUTE ONLY): 25 min   Charges:   PT Evaluation $PT Eval Low Complexity: 1 Low PT Treatments $Gait Training: 8-22 mins          Ezekiel Ina, PT DPT 05/02/2019, 1:55 PM

## 2019-05-02 NOTE — Care Management Obs Status (Signed)
MEDICARE OBSERVATION STATUS NOTIFICATION   Patient Details  Name: Jody Lowe MRN: [id number] Date of Birth: Dec 07, 1945   Medicare Observation Status Notification Given:  Yes    Delphia Grates, LCSW 05/02/2019, 3:44 PM

## 2019-05-02 NOTE — Progress Notes (Addendum)
PROGRESS NOTE    Jody Lowe  CWC:[id number] DOB: 02/20/1946 DOA: 05/01/2019 PCP: System, Pcp Not In    Brief Narrative:  Jody Lowe is a 74 y.o. female with medical history significant of HTN, anemia, tobacco abuse and PAD seen in ed for left forearm weakness that started on Monday and since then has remained the same. No variation pattern or worsening.  The patient woke up with this symptom and felt that she probably slept on her arm wrong, but has persisted since then. She feels like her hand will not do what she wanted to do. She denies any other weakness, any numbness, difficulty with speaking or finding words, or any facial droop. She has no prior history of a stroke.she was admitted for stroke /TIA rule out.    Consultants:   None  Procedures: Echo, carotid ultrasound, CT, MRI  Antimicrobials:   None   Subjective: She feels better however still has left arm weakness somewhat painful when she is raising it she thinks is from her arthritis.  I had her leg raise her arm which she is able to do slowly but does elicit pain which appears to be arthritis . She reports being on chronic pain meds at home  Objective: Vitals:   05/02/19 0010 05/02/19 0230 05/02/19 0415 05/02/19 0751  BP: (!) 113/50 (!) 110/54 (!) 120/53 111/83  Pulse: 76 81 77 76  Resp: 16 16 16 20   Temp: 98 F (36.7 C) 98.5 F (36.9 C) 98.5 F (36.9 C) 98.7 F (37.1 C)  TempSrc: Oral Oral Oral Oral  SpO2: 98% 96% 96% 100%  Weight:      Height:        Intake/Output Summary (Last 24 hours) at 05/02/2019 1107 Last data filed at 05/02/2019 0617 Gross per 24 hour  Intake 838.36 ml  Output --  Net 838.36 ml   Filed Weights   05/01/19 1139  Weight: 86.2 kg    Examination:  General exam: Appears calm and comfortable , NAD Respiratory system: Clear to auscultation. Respiratory effort normal. Cardiovascular system: S1 & S2 heard, RRR. No JVD, murmurs, rubs, gallops or clicks. Gastrointestinal  system: Abdomen is nondistended, soft and nontender.  Normal bowel sounds heard. Central nervous system: Alert and oriented. No focal neurological deficits.  4 out of 4 x 4.  Able to lift left arm but it does elicit pain Extremities: No edema or cyanosis Skin: Warm dry Psychiatry: Judgement and insight appear normal. Mood & affect appropriate.     Data Reviewed: I have personally reviewed following labs and imaging studies  CBC: Recent Labs  Lab 05/01/19 1155 05/01/19 1407  WBC 5.9 6.5  NEUTROABS 4.0  --   HGB 11.5* 10.6*  HCT 35.9* 32.0*  MCV 85.9 84.2  PLT 497* 459*   Basic Metabolic Panel: Recent Labs  Lab 05/01/19 1315 05/01/19 1407  NA 136  --   K 2.7*  --   CL 107  --   CO2 21*  --   GLUCOSE 78  --   BUN 10  --   CREATININE 0.97 0.92  CALCIUM 7.5*  --   MG  --  1.6*   GFR: Estimated Creatinine Clearance: 58.5 mL/min (by C-G formula based on SCr of 0.92 mg/dL). Liver Function Tests: Recent Labs  Lab 05/01/19 1315  AST 10*  ALT 8  ALKPHOS 86  BILITOT 0.5  PROT 5.6*  ALBUMIN 2.7*   No results for input(s): LIPASE, AMYLASE in the last 168  hours. No results for input(s): AMMONIA in the last 168 hours. Coagulation Profile: Recent Labs  Lab 05/01/19 1315  INR 1.1   Cardiac Enzymes: No results for input(s): CKTOTAL, CKMB, CKMBINDEX, TROPONINI in the last 168 hours. BNP (last 3 results) No results for input(s): PROBNP in the last 8760 hours. HbA1C: Recent Labs    05/02/19 0552  HGBA1C 5.6   CBG: No results for input(s): GLUCAP in the last 168 hours. Lipid Profile: Recent Labs    05/02/19 0552  CHOL 109  HDL 34*  LDLCALC 59  TRIG 81  CHOLHDL 3.2   Thyroid Function Tests: No results for input(s): TSH, T4TOTAL, FREET4, T3FREE, THYROIDAB in the last 72 hours. Anemia Panel: No results for input(s): VITAMINB12, FOLATE, FERRITIN, TIBC, IRON, RETICCTPCT in the last 72 hours. Sepsis Labs: No results for input(s): PROCALCITON, LATICACIDVEN in  the last 168 hours.  Recent Results (from the past 240 hour(s))  SARS CORONAVIRUS 2 (TAT 6-24 HRS) Nasopharyngeal Nasopharyngeal Swab     Status: None   Collection Time: 05/01/19  2:25 PM   Specimen: Nasopharyngeal Swab  Result Value Ref Range Status   SARS Coronavirus 2 NEGATIVE NEGATIVE Final    Comment: (NOTE) SARS-CoV-2 target nucleic acids are NOT DETECTED. The SARS-CoV-2 RNA is generally detectable in upper and lower respiratory specimens during the acute phase of infection. Negative results do not preclude SARS-CoV-2 infection, do not rule out co-infections with other pathogens, and should not be used as the sole basis for treatment or other patient management decisions. Negative results must be combined with clinical observations, patient history, and epidemiological information. The expected result is Negative. Fact Sheet for Patients: HairSlick.no Fact Sheet for Healthcare Providers: quierodirigir.com This test is not yet approved or cleared by the [place] FDA and  has been authorized for detection and/or diagnosis of SARS-CoV-2 by FDA under an Emergency Use Authorization (EUA). This EUA will remain  in effect (meaning this test can be used) for the duration of the COVID-19 declaration under Section 56 4(b)(1) of the Act, 21 U.S.C. section 360bbb-3(b)(1), unless the authorization is terminated or revoked sooner. Performed at [facility], [address] [place], [place] [address]          Radiology Studies: CT HEAD WO CONTRAST  Result Date: 05/01/2019 CLINICAL DATA:  74 year old female with left-sided weakness for greater than 6 hours. Stroke suspected. EXAM: CT HEAD WITHOUT CONTRAST TECHNIQUE: Contiguous axial images were obtained from the base of the skull through the vertex without intravenous contrast. COMPARISON:  None. FINDINGS: Brain: No evidence of acute infarction, hemorrhage, hydrocephalus,  extra-axial collection or mass lesion/mass effect. Mild periventricular white matter hypoattenuation consistent with chronic microvascular ischemic white matter disease. Trace mineralization present in the bilateral globus pallidus. Vascular: No hyperdense vessel or unexpected calcification. Skull: Normal. Negative for fracture or focal lesion. Sinuses/Orbits: No acute finding. 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 Other: None. IMPRESSION: 1. No acute intracranial abnormality. 2. Mild chronic microvascular ischemic white matter changes. Electronically Signed   By: Malachy Moan M.D.   On: 05/01/2019 12:58   MR BRAIN WO CONTRAST  Result Date: 05/01/2019 CLINICAL DATA:  TIA.  Left forearm weakness starting on Monday. EXAM: MRI HEAD WITHOUT CONTRAST TECHNIQUE: Multiplanar, multiecho pulse sequences of the brain and surrounding structures were obtained without intravenous contrast. COMPARISON:  Head CT from earlier today FINDINGS: Brain: No acute infarction, hemorrhage, hydrocephalus, extra-axial collection or mass lesion. Mild for age chronic small vessel ischemia. Normal brain  volume. No finding to explain weakness Vascular: Normal flow voids. Tortuosity of the right ICA at the skull base. Skull and upper cervical spine: Prominent C2-3 and C3-4 disc degeneration. Sinuses/Orbits: Nasopharyngeal retention cysts. Clear paranasal sinuses and negative orbits. Other: Intermittent motion artifact IMPRESSION: 1. No acute finding including infarct. 2. Mild chronic small vessel ischemia. Electronically Signed   By: Monte Fantasia M.D.   On: 05/01/2019 21:29   US Carotid Bilateral (at [facility] and [facility] only)  Result Date: 05/02/2019 CLINICAL DATA:  74 year old female with TIA EXAM: BILATERAL CAROTID DUPLEX ULTRASOUND TECHNIQUE: Pearline Cables scale imaging, color Doppler and duplex ultrasound were performed of bilateral carotid and vertebral arteries in the neck. COMPARISON:  None. FINDINGS: Criteria: Quantification  of carotid stenosis is based on velocity parameters that correlate the residual internal carotid diameter with NASCET-based stenosis levels, using the diameter of the distal internal carotid lumen as the denominator for stenosis measurement. The following velocity measurements were obtained: RIGHT ICA:  Systolic 98 cm/sec, Diastolic 14 cm/sec CCA:  91 cm/sec SYSTOLIC ICA/CCA RATIO:  1.1 ECA:  90 cm/sec LEFT ICA:  Systolic 093 cm/sec, Diastolic 818 cm/sec CCA:  88 cm/sec SYSTOLIC ICA/CCA RATIO:  1.4 ECA:  132 cm/sec Right Brachial SBP: Not acquired Left Brachial SBP: Not acquired RIGHT CAROTID ARTERY: No significant calcifications of the right common carotid artery. Intermediate waveform maintained. Heterogeneous and partially calcified plaque at the right carotid bifurcation. No significant lumen shadowing. Low resistance waveform of the right ICA. Tortuosity present RIGHT VERTEBRAL ARTERY: Antegrade flow with low resistance waveform. LEFT CAROTID ARTERY: No significant calcifications of the left common carotid artery. Intermediate waveform maintained. Heterogeneous and partially calcified plaque at the left carotid bifurcation without significant lumen shadowing. Low resistance waveform of the left ICA. Tortuosity present. LEFT VERTEBRAL ARTERY:  Antegrade flow with low resistance waveform. IMPRESSION: Color duplex indicates minimal heterogeneous and calcified plaque, with no hemodynamically significant stenosis by duplex criteria in the extracranial cerebrovascular circulation. Signed, Dulcy Fanny. Dellia Nims, RPVI [facility] Electronically Signed   By: Corrie Mckusick D.O.   On: 05/02/2019 08:30        Scheduled Meds: . aspirin EC  81 mg Oral Daily  . heparin  5,000 Units Subcutaneous Q8H  . pantoprazole (PROTONIX) IV  40 mg Intravenous Q24H  . rosuvastatin  40 mg Oral q1800   Continuous Infusions: . sodium chloride 50 mL/hr at 05/02/19 0617  .  sodium chloride Stopped (05/02/19 0119)    Assessment & Plan:   Principal Problem:   TIA (transient ischemic attack) Active Problems:   Tobacco use disorder   Peripheral vascular disease (HCC)   Iron deficiency anemia due to chronic blood loss   Hypertension, benign   #1 hypertension-stable currently continue to monitor #2 left arm weakness- with some pain with elevation , which she thinks its her arthritis. No focal deficits on exam. CT/MRI No acute finding including infarct. Carotid ultrasound without significant hemodynamic stenosis Echo pending We will have PT OT evaluate her prior to discharge #3 peripheral vascular disease-followed as outpt.on asa, statin. Needs to stop smoking #4 iron deficiency anemia - on iron replacement as outpt. #5 tobacco abuse-was counseled on smoking tobacco cessation #6 hypokalemia-replace with IV potassium.  Will repeat BMP   DVT prophylaxis: Heparin Code Status: Full Family Communication: None bedside Disposition Plan: PT OT prior to discharge, echo pending we will likely DC in a.m.       LOS: 0 days   Time spent:  45 minutes with more than 50% COC    Lynn Ito, MD [facility] Pager [phone]  If 7PM-7AM, please contact night-coverage [email or web address] Password [facility] 05/02/2019, 11:07 AM

## 2019-05-03 ENCOUNTER — Observation Stay (HOSPITAL_BASED_OUTPATIENT_CLINIC_OR_DEPARTMENT_OTHER)
Admit: 2019-05-03 | Discharge: 2019-05-03 | Disposition: A | Discharge status: Home / Self Care | Payer: Medicare Other | Attending: Internal Medicine | Admitting: Internal Medicine

## 2019-05-03 DIAGNOSIS — G459 Transient cerebral ischemic attack, unspecified: Secondary | ICD-10-CM | POA: Diagnosis not present

## 2019-05-03 LAB — POTASSIUM: Potassium: 3.7 mmol/L (ref 3.5–5.1)

## 2019-05-03 NOTE — Progress Notes (Signed)
*  PRELIMINARY RESULTS* Echocardiogram 2D Echocardiogram has been performed.  Cristela Blue 05/03/2019, 1:30 PM

## 2019-05-03 NOTE — TOC Initial Note (Signed)
Transition of Care [facility]) - Initial/Assessment Note    Patient Details  Name: Jody Lowe MRN: [id number] Date of Birth: 1945-04-08  Transition of Care [facility]) CM/SW Contact:    Su Hilt, RN Phone Number: 05/03/2019, 12:45 PM  Clinical Narrative:                 Met with the patient to discuss DC plan and needs, she livres alone but will be going to her sisiters, she has a RWS and a single point cane at home but would benefit from a 3 in 1, I notified Brad with [employer] of the need She said she has used HH before and does not see a need to use this time, she has a list of activities to do from PT and OT and will do them at home, She can afford her medications and has transportation, no additional needs at this time Expected Discharge Plan: Home/Self Care Barriers to Discharge: Barriers Resolved   Patient Goals and CMS Choice Patient states their goals for this hospitalization and ongoing recovery are:: going to her sisiters      Expected Discharge Plan and Services Expected Discharge Plan: Home/Self Care   Discharge Planning Services: CM Consult   Living arrangements for the past 2 months: Single Family Home                 DME Arranged: 3-N-1 DME Agency: [employer] Date DME Agency Contacted: 05/03/19 Time DME Agency Contacted: [phone] Representative spoke with at DME Agency: [facility] HH Arranged: Patient Refused [place]          Prior Living Arrangements/Services Living arrangements for the past 2 months: Single Family Home Lives with:: Self(going to her sisters) Patient language and need for interpreter reviewed:: Yes Do you feel safe going back to the place where you live?: Yes      Need for Family Participation in Patient Care: No (Comment) Care giver support system in place?: Yes (comment) Current home services: DME(RW, single poit cane) Criminal Activity/Legal Involvement Pertinent to Current Situation/Hospitalization: No - Comment as needed  Activities of Daily  Living Home Assistive Devices/Equipment: Environmental consultant (specify type), Cane (specify quad or straight) ADL Screening (condition at time of admission) Patient's cognitive ability adequate to safely complete daily activities?: Yes Is the patient deaf or have difficulty hearing?: No Does the patient have difficulty seeing, even when wearing glasses/contacts?: Yes Does the patient have difficulty concentrating, remembering, or making decisions?: No Patient able to express need for assistance with ADLs?: Yes Does the patient have difficulty dressing or bathing?: Yes Independently performs ADLs?: Yes (appropriate for developmental age) Does the patient have difficulty walking or climbing stairs?: Yes Weakness of Legs: Both Weakness of Arms/Hands: Left  Permission Sought/Granted   Permission granted to share information with : Yes, Verbal Permission Granted              Emotional Assessment Appearance:: Appears stated age Attitude/Demeanor/Rapport: Engaged Affect (typically observed): Appropriate Orientation: : Oriented to Self, Oriented to Place, Oriented to  Time, Oriented to Situation   Psych Involvement: No (comment)  Admission diagnosis:  TIA (transient ischemic attack) [G45.9] Hypokalemia [E87.6] Cerebrovascular accident (CVA), unspecified mechanism ([place]) [I63.9] Patient Active Problem List   Diagnosis Date Noted  . TIA (transient ischemic attack) 05/01/2019  . Tobacco use disorder 05/09/2015  . Iron deficiency anemia due to chronic blood loss 03/15/2009  . Peripheral vascular disease ([place]) 07/10/2005  . Hypertension, benign 03/29/2003  . Depressive disorder 10/06/2001   PCP:  System, Pcp Not In Pharmacy:   [place], [place] [address] [place] [address] Phone: [phone] Fax: [phone]     Social Determinants of Health (SDOH) Interventions    Readmission Risk Interventions No flowsheet data found.

## 2019-05-03 NOTE — Plan of Care (Signed)
  Problem: Education: Goal: Knowledge of General Education information will improve Description: Including pain rating scale, medication(s)/side effects and non-pharmacologic comfort measures 05/03/2019 1542 by Luretha Murphy, RN Outcome: Progressing 05/03/2019 1211 by Luretha Murphy, RN Outcome: Progressing   Problem: Activity: Goal: Risk for activity intolerance will decrease 05/03/2019 1542 by Luretha Murphy, RN Outcome: Progressing 05/03/2019 1211 by Luretha Murphy, RN Outcome: Progressing   Problem: Elimination: Goal: Will not experience complications related to bowel motility 05/03/2019 1542 by Luretha Murphy, RN Outcome: Progressing 05/03/2019 1211 by Luretha Murphy, RN Outcome: Progressing Goal: Will not experience complications related to urinary retention 05/03/2019 1542 by Luretha Murphy, RN Outcome: Progressing 05/03/2019 1211 by Luretha Murphy, RN Outcome: Progressing   Problem: Pain Managment: Goal: General experience of comfort will improve 05/03/2019 1542 by Luretha Murphy, RN Outcome: Progressing 05/03/2019 1211 by Luretha Murphy, RN Outcome: Progressing   Problem: Safety: Goal: Ability to remain free from injury will improve 05/03/2019 1542 by Luretha Murphy, RN Outcome: Progressing 05/03/2019 1211 by Luretha Murphy, RN Outcome: Progressing   Problem: Skin Integrity: Goal: Risk for impaired skin integrity will decrease 05/03/2019 1542 by Luretha Murphy, RN Outcome: Progressing 05/03/2019 1211 by Luretha Murphy, RN Outcome: Progressing   Problem: Education: Goal: Knowledge of disease or condition will improve 05/03/2019 1542 by Luretha Murphy, RN Outcome: Progressing 05/03/2019 1211 by Luretha Murphy, RN Outcome: Progressing Goal: Knowledge of secondary prevention will improve 05/03/2019 1542 by Luretha Murphy, RN Outcome: Progressing 05/03/2019 1211 by Luretha Murphy, RN Outcome: Progressing Goal: Knowledge of patient specific risk factors addressed and post discharge goals  established will improve 05/03/2019 1542 by Luretha Murphy, RN Outcome: Progressing 05/03/2019 1211 by Luretha Murphy, RN Outcome: Progressing   Problem: Ischemic Stroke/TIA Tissue Perfusion: Goal: Complications of ischemic stroke/TIA will be minimized 05/03/2019 1542 by Luretha Murphy, RN Outcome: Progressing 05/03/2019 1211 by Luretha Murphy, RN Outcome: Progressing

## 2019-05-03 NOTE — Evaluation (Signed)
Occupational Therapy Evaluation Patient Details Name: Jody Lowe MRN: [id number] DOB: 1945-06-02 Today's Date: 05/03/2019    History of Present Illness Pt is a 74 y.o. female with medical history significant of HTN, anemia, tobacco abuse and PAD seen in ed for left forearm weakness that started on Monday and since then has remained the same. No variation pattern or worsening.   Clinical Impression   Pt seen for OT evaluation this date. Prior to approx 1 week ago when symptoms first appeared in LUE, pt was independent in all aspects of ADL/IADL, driving, and primarily using a SPC for most mobility.  Pt lives by herself in a 1 story home with 5 steps to enter with L/R hand rails and tub/shower with shower chair. Pt verbalizes plan to stay with her sister for a while after discharge (sister's home is 1 story, level entry, tub/shower). Currently pt demonstrates impairments in strength with increased weakness in LUE, LUE fine motor coordination, and balance impairing her ability to perform mobility and ADL tasks at baseline independence. Pt endorses difficulty with self feeding, grooming, and other tasks requiring bilat UE involvement and functional use of L hand. Pt performs ADL transfers with CGA, requires increased time/effort to perform BUE tasks. No visual, cognitive, and sensory deficits appreciated. Pt instructed in falls prevention strategies, med mgt strategies, and FMC activities and exercises for LUE with handout provided to support recall and carryover. Pt able to return demo [facility] ex/activities with some difficulty. Pt educated in benefits of home health therapy for her specific needs and pt verbalized understanding and desire to participate in order to maximize return to PLOF. Recommend HHOT services upon discharge.    Follow Up Recommendations  Home health OT    Equipment Recommendations  None recommended by OT    Recommendations for Other Services       Precautions / Restrictions  Precautions Precautions: Fall Restrictions Weight Bearing Restrictions: No      Mobility Bed Mobility Overal bed mobility: Modified Independent Bed Mobility: Supine to Sit;Sit to Supine              Transfers Overall transfer level: Needs assistance Equipment used: Rolling walker (2 wheeled) Transfers: Sit to/from Stand Sit to Stand: Min guard;Min assist              Balance Overall balance assessment: Needs assistance Sitting-balance support: Single extremity supported;No upper extremity supported;Feet supported Sitting balance-Leahy Scale: Good     Standing balance support: Bilateral upper extremity supported Standing balance-Leahy Scale: Fair                             ADL either performed or assessed with clinical judgement   ADL Overall ADL's : Needs assistance/impaired Eating/Feeding: Modified independent Eating/Feeding Details (indicate cue type and reason): decr FMC in LUE requiring increased time/effort and/or modifications for how to performs bilat tasks such as opening packets/containers and cutting food with utensils Grooming: Modified independent Grooming Details (indicate cue type and reason): decr FMC in LUE requiring increased time/effort and/or modifications for how to performs bilat tasks Upper Body Bathing: Sitting;Supervision/ safety;Set up   Lower Body Bathing: Sit to/from stand;Min guard Lower Body Bathing Details (indicate cue type and reason): CGA for ADL transfer Upper Body Dressing : Sitting;Supervision/safety;Set up   Lower Body Dressing: Sit to/from stand;Min guard Lower Body Dressing Details (indicate cue type and reason): CGA for ADL transfer Toilet Transfer: Min guard;Ambulation;BSC;RW  Vision Baseline Vision/History: Wears glasses;Cataracts Wears Glasses: Reading only Patient Visual Report: No change from baseline Vision Assessment?: No apparent visual deficits     Perception      Praxis      Pertinent Vitals/Pain Pain Assessment: No/denies pain     Hand Dominance Right   Extremity/Trunk Assessment Upper Extremity Assessment Upper Extremity Assessment: LUE deficits/detail(RUE at least 4-/5, shoulder arthritis per pt) LUE Deficits / Details: at least 4-/5, shoulder 3+/5, shoulder arthritis per pt, denies sensory deficits, FMC deficits noted with finger to nose, thumb opposition testing, and functional tasks involving pinching, grasping, finger to palm translation LUE Sensation: WNL LUE Coordination: decreased fine motor   Lower Extremity Assessment Lower Extremity Assessment: Generalized weakness       Communication Communication Communication: No difficulties   Cognition Arousal/Alertness: Awake/alert Behavior During Therapy: WFL for tasks assessed/performed Overall Cognitive Status: Within Functional Limits for tasks assessed                                     General Comments       Exercises Other Exercises Other Exercises: Pt educated in falls prevention strategies, home/routines modifications, and med mgt strategies Other Exercises: Pt instructed in [facility] ex with handout provided - pt able to demo understanding of each but with difficulty performing with LUE   Shoulder Instructions      Home Living Family/patient expects to be discharged to:: Private residence Living Arrangements: Alone Available Help at Discharge: Family(sister) Type of Home: House Home Access: Stairs to enter [employer] of Steps: 5 Entrance Stairs-Rails: Right;Left;Can reach both Home Layout: One level     Bathroom Shower/Tub: Chief Strategy Officer: Standard     Home Equipment: Environmental consultant - 2 wheels;Shower seat;Bedside commode;Cane - single point   Additional Comments: Pt plans to stay at sister's home for 2 weeks: level entry, 1 story, tub shower      Prior Functioning/Environment Level of Independence: Independent with  assistive device(s)        Comments: SPC for most mobility, indep with ADL, driving, med mgt with pill organizer        OT Problem List: Decreased coordination;Decreased strength;Impaired balance (sitting and/or standing);Decreased knowledge of use of DME or AE;Impaired UE functional use      OT Treatment/Interventions: Self-care/ADL training;Therapeutic exercise;Therapeutic activities;Neuromuscular education;DME and/or AE instruction;Patient/family education;Balance training    OT Goals(Current goals can be found in the care plan section) Acute Rehab OT Goals Patient Stated Goal: to use my left arm better and go back home OT Goal Formulation: With patient Time For Goal Achievement: 05/17/19 Potential to Achieve Goals: Good ADL Goals Pt Will Transfer to Toilet: with modified independence;ambulating(BSC over toilet, LRAD for amb) Additional ADL Goal #1: Pt will perform medication mgt with 100% accuracy, incorporating LUE into task and without spilling medication. Additional ADL Goal #2: Pt will return demo independent understanding of FMC exercises to perform at home.  OT Frequency: Min 2X/week   Barriers to D/C:            Co-evaluation              AM-PAC OT "6 Clicks" Daily Activity     Outcome Measure Help from another person eating meals?: None Help from another person taking care of personal grooming?: None Help from another person toileting, which includes using toliet, bedpan, or urinal?: A Little Help from another person bathing (  including washing, rinsing, drying)?: A Little Help from another person to put on and taking off regular upper body clothing?: None Help from another person to put on and taking off regular lower body clothing?: A Little 6 Click Score: 21   End of Session    Activity Tolerance: Patient tolerated treatment well Patient left: in bed;with call bell/phone within reach;with bed alarm set  OT Visit Diagnosis: Other abnormalities of gait  and mobility (R26.89);Hemiplegia and hemiparesis;Muscle weakness (generalized) (M62.81) Hemiplegia - Right/Left: Left Hemiplegia - dominant/non-dominant: Non-Dominant Hemiplegia - caused by: Unspecified                Time: 0922-1001 OT Time Calculation (min): 39 min Charges:  OT General Charges $OT Visit: 1 Visit OT Evaluation $OT Eval Low Complexity: 1 Low OT Treatments $Self Care/Home Management : 8-22 mins $Neuromuscular Re-education: 8-22 mins  Jeni Salles, MPH, MS, OTR/L ascom [phone] 05/03/19, 10:22 AM

## 2019-05-03 NOTE — Discharge Summary (Signed)
Jody Lowe CMK:[id number] DOB: Feb 09, 1946 DOA: 05/01/2019  PCP: System, Pcp Not In  Admit date: 05/01/2019 Discharge date: 05/03/2019  Admitted From: home Disposition:  home  Recommendations for Outpatient Follow-up:  1. Follow up with PCP in 1 week 2. Please obtain BMP/CBC in one week   Home Health:yes, PT/OT    Discharge Condition:Stable CODE STATUS: Full Diet recommendation: Heart Healthy Brief/Interim Summary: Jody Lowe is a 74 y.o. female with medical history significant of HTN, anemia, tobacco abuse and PAD seen in ed for left forearm weakness that started on Monday and since then has remained the same.The patient woke up with this symptom and felt that she probably slept on her arm wrong, but has persisted since then. She feels like her hand will not do what she wanted to do. She denies any other weakness, any numbness, difficulty with speaking or finding words, or any facial droop. She has no prior history of a stroke.  Patient was admitted to the hospital service.  CT of the head without any acute mild to.  Full results below.  MRI negative for acute infarct.  Carotid duplex that indicated no hemodynamic significant stenosis by criteria.  Phillip Heal that revealed normal EF and bubble study was negative.She was ruled out for TIA and CVA. Likely her left arm weakness was from arthritis as she could lift but had reproducible pain and likely how she slept on it. She was also found mildly hypokalemic with a potassium 3.3 which was replaced and follow-up potassium level was 3.7.  Today she is stable to be discharged home.  PT/ OT saw the patient and they recommended home physical therapy and Occupational Therapy.  Discharge Diagnoses:  Principal Problem:   TIA (transient ischemic attack) Active Problems:   Tobacco use disorder   Peripheral vascular disease (HCC)   Iron deficiency anemia due to chronic blood loss   Hypertension, benign    Discharge Instructions  Discharge  Instructions    Diet - low sodium heart healthy   Complete by: As directed    Increase activity slowly   Complete by: As directed      Allergies as of 05/03/2019      Reactions   Gabapentin Other (See Comments)   Nervous, shaky   Plavix [clopidogrel Bisulfate] Rash      Medication List    STOP taking these medications   hydrochlorothiazide 25 MG tablet Commonly known as: HYDRODIURIL   propranolol 20 MG tablet Commonly known as: INDERAL     TAKE these medications   albuterol 108 (90 Base) MCG/ACT inhaler Commonly known as: VENTOLIN HFA Inhale 1-2 puffs into the lungs every 6 (six) hours as needed for wheezing or shortness of breath.   alendronate 70 MG tablet Commonly known as: FOSAMAX Take 70 mg by mouth once a week.   amitriptyline 25 MG tablet Commonly known as: ELAVIL Take 25 mg by mouth at bedtime.   aspirin EC 81 MG tablet Take 81 mg by mouth daily.   diclofenac Sodium 1 % Gel Commonly known as: VOLTAREN Apply 2 g topically 4 (four) times daily.   esomeprazole 40 MG capsule Commonly known as: NEXIUM Take 40 mg by mouth daily.   ferrous sulfate 325 (65 FE) MG tablet Take 325 mg by mouth every other day.   FLUoxetine 40 MG capsule Commonly known as: PROZAC Take 40-80 mg by mouth daily. Take one capsule (40 mg) by mouth once daily for one month, then increase to two capsules (80 mg)  thereafter   fluticasone 50 MCG/ACT nasal spray Commonly known as: FLONASE Place 1 spray into both nostrils daily.   ipratropium 0.03 % nasal spray Commonly known as: ATROVENT Place 2 sprays into both nostrils 3 (three) times daily.   lovastatin 40 MG tablet Commonly known as: MEVACOR Take 40 mg by mouth daily with supper.   naloxone 4 MG/0.1ML Liqd nasal spray kit Commonly known as: NARCAN Place 1 spray into the nose once.   nystatin powder Generic drug: nystatin Apply 1 application topically 2 (two) times daily.   oxybutynin 10 MG 24 hr tablet Commonly known  as: DITROPAN-XL Take 10 mg by mouth 2 (two) times daily.   Oxycodone HCl 10 MG Tabs Take 10 mg by mouth every 6 (six) hours as needed (pain).   polyethylene glycol 17 g packet Commonly known as: MIRALAX / GLYCOLAX Take 17 g by mouth daily as needed for mild constipation.   senna-docusate 8.6-50 MG tablet Commonly known as: Senokot-S Take 2 tablets by mouth daily.   Vitamin D (Ergocalciferol) 1.25 MG (50000 UNIT) Caps capsule Commonly known as: DRISDOL Take 50,000 Units by mouth once a week. For 8 doses       Allergies  Allergen Reactions  . Gabapentin Other (See Comments)    Nervous, shaky  . Plavix [Clopidogrel Bisulfate] Rash    Consultations:  None   Procedures/Studies: CT HEAD WO CONTRAST  Result Date: 05/01/2019 CLINICAL DATA:  74 year old female with left-sided weakness for greater than 6 hours. Stroke suspected. EXAM: CT HEAD WITHOUT CONTRAST TECHNIQUE: Contiguous axial images were obtained from the base of the skull through the vertex without intravenous contrast. COMPARISON:  None. FINDINGS: Brain: No evidence of acute infarction, hemorrhage, hydrocephalus, extra-axial collection or mass lesion/mass effect. Mild periventricular white matter hypoattenuation consistent with chronic microvascular ischemic white matter disease. Trace mineralization present in the bilateral globus pallidus. Vascular: No hyperdense vessel or unexpected calcification. Skull: Normal. Negative for fracture or focal lesion. Sinuses/Orbits: No acute finding. 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 Other: None. IMPRESSION: 1. No acute intracranial abnormality. 2. Mild chronic microvascular ischemic white matter changes. Electronically Signed   By: Jacqulynn Cadet M.D.   On: 05/01/2019 12:58   MR BRAIN WO CONTRAST  Result Date: 05/01/2019 CLINICAL DATA:  TIA.  Left forearm weakness starting on Monday. EXAM: MRI HEAD WITHOUT CONTRAST TECHNIQUE: Multiplanar, multiecho pulse sequences  of the brain and surrounding structures were obtained without intravenous contrast. COMPARISON:  Head CT from earlier today FINDINGS: Brain: No acute infarction, hemorrhage, hydrocephalus, extra-axial collection or mass lesion. Mild for age chronic small vessel ischemia. Normal brain volume. No finding to explain weakness Vascular: Normal flow voids. Tortuosity of the right ICA at the skull base. Skull and upper cervical spine: Prominent C2-3 and C3-4 disc degeneration. Sinuses/Orbits: Nasopharyngeal retention cysts. Clear paranasal sinuses and negative orbits. Other: Intermittent motion artifact IMPRESSION: 1. No acute finding including infarct. 2. Mild chronic small vessel ischemia. Electronically Signed   By: Monte Fantasia M.D.   On: 05/01/2019 21:29   US Carotid Bilateral (at [facility] and [facility] only)  Result Date: 05/02/2019 CLINICAL DATA:  74 year old female with TIA EXAM: BILATERAL CAROTID DUPLEX ULTRASOUND TECHNIQUE: Pearline Cables scale imaging, color Doppler and duplex ultrasound were performed of bilateral carotid and vertebral arteries in the neck. COMPARISON:  None. FINDINGS: Criteria: Quantification of carotid stenosis is based on velocity parameters that correlate the residual internal carotid diameter with NASCET-based stenosis levels, using the diameter of the distal internal carotid lumen  as the denominator for stenosis measurement. The following velocity measurements were obtained: RIGHT ICA:  Systolic 98 cm/sec, Diastolic 14 cm/sec CCA:  91 cm/sec SYSTOLIC ICA/CCA RATIO:  1.1 ECA:  90 cm/sec LEFT ICA:  Systolic 157 cm/sec, Diastolic 262 cm/sec CCA:  88 cm/sec SYSTOLIC ICA/CCA RATIO:  1.4 ECA:  132 cm/sec Right Brachial SBP: Not acquired Left Brachial SBP: Not acquired RIGHT CAROTID ARTERY: No significant calcifications of the right common carotid artery. Intermediate waveform maintained. Heterogeneous and partially calcified plaque at the right carotid bifurcation. No significant lumen shadowing. Low  resistance waveform of the right ICA. Tortuosity present RIGHT VERTEBRAL ARTERY: Antegrade flow with low resistance waveform. LEFT CAROTID ARTERY: No significant calcifications of the left common carotid artery. Intermediate waveform maintained. Heterogeneous and partially calcified plaque at the left carotid bifurcation without significant lumen shadowing. Low resistance waveform of the left ICA. Tortuosity present. LEFT VERTEBRAL ARTERY:  Antegrade flow with low resistance waveform. IMPRESSION: Color duplex indicates minimal heterogeneous and calcified plaque, with no hemodynamically significant stenosis by duplex criteria in the extracranial cerebrovascular circulation. Signed, Dulcy Fanny. Dellia Nims, RPVI [facility] Electronically Signed   By: Corrie Mckusick D.O.   On: 05/02/2019 08:30   ECHOCARDIOGRAM LIMITED BUBBLE STUDY  Result Date: 05/03/2019    ECHOCARDIOGRAM LIMITED REPORT   Patient Name:   Jody Lowe Date of Exam: 05/03/2019 Medical Rec #:  [id number]      Height:       66.0 in Accession #:    [id number]     Weight:       190.0 lb Date of Birth:  September 05, 1945     BSA:          1.96 m Patient Age:    71 years       BP:           119/52 mmHg Patient Gender: F              HR:           83 bpm. Exam Location:  [facility] Procedure: Limited Echo, Color Doppler and Saline Contrast Bubble Study Indications:     TIA 435.9  History:         Patient has no prior history of Echocardiogram examinations.                  Risk Factors:Hypertension. PAD.  Sonographer:     Sherrie Sport RDCS (AE) Referring Phys:  [place] Diagnosing Phys: Kathlyn Sacramento MD IMPRESSIONS  1. Left ventricular ejection fraction, by estimation, is 60 to 65%. The left ventricle has normal function. Left ventricular diastolic function could not be evaluated.  2. Right ventricular systolic function is normal. There is normal pulmonary artery systolic pressure.  3. Agitated saline  contrast bubble study was negative, with no evidence of any interatrial shunt. Suboptimal visualization. Conclusion(s)/Recomendation(s): No intracardiac source of embolism detected on this transthoracic study. FINDINGS  Left Ventricle: Left ventricular ejection fraction, by estimation, is 60 to 65%. The left ventricle has normal function. The left ventricle has no regional wall motion abnormalities. The left ventricular internal cavity size was normal in size. There is  no left ventricular hypertrophy. Right Ventricle: The right ventricular size is normal. No increase in right ventricular wall thickness. Right ventricular systolic function is normal. There is normal pulmonary artery systolic pressure. The tricuspid regurgitant velocity is 2.40 m/s, and  with an assumed right atrial pressure of 3 mmHg, the  estimated right ventricular systolic pressure is 03.0 mmHg. Left Atrium: Left atrial size was normal in size. Right Atrium: Right atrial size was normal in size. Pericardium: There is no evidence of pericardial effusion. Mitral Valve: The mitral valve is normal in structure and function. Normal mobility of the mitral valve leaflets. No evidence of mitral valve stenosis. Tricuspid Valve: The tricuspid valve is normal in structure. Tricuspid valve regurgitation is trivial. No evidence of tricuspid stenosis. Aortic Valve: The aortic valve is normal in structure and function. Aortic valve regurgitation is not visualized. No aortic stenosis is present. Pulmonic Valve: The pulmonic valve was normal in structure. Pulmonic valve regurgitation is not visualized. No evidence of pulmonic stenosis. Aorta: The aortic root is normal in size and structure. Pulmonary Artery: The pulmonary artery is of normal size. Venous: The left upper pulmonary vein, left lower pulmonary vein, right upper pulmonary vein and right lower pulmonary vein are normal. The inferior vena cava is normal in size with greater than 50% respiratory  variability, suggesting right atrial pressure of 3 mmHg. IAS/Shunts: No atrial level shunt detected by color flow Doppler. Agitated saline contrast was given intravenously to evaluate for intracardiac shunting. Agitated saline contrast bubble study was negative, with no evidence of any interatrial shunt.  LEFT VENTRICLE PLAX 2D LVIDd:         4.75 cm LVIDs:         2.85 cm LV PW:         0.92 cm LV IVS:        0.84 cm LVOT diam:     2.00 cm LV SV Index:   36.48 LVOT Area:     3.14 cm  LEFT ATRIUM         Index LA diam:    3.10 cm 1.58 cm/m   AORTA Ao Root diam: 2.70 cm TRICUSPID VALVE TR Peak grad:   23.0 mmHg TR Vmax:        240.00 cm/s  SHUNTS Systemic Diam: 2.00 cm Kathlyn Sacramento MD Electronically signed by Kathlyn Sacramento MD Signature Date/Time: 05/03/2019/1:41:18 PM    Final        Subjective: Patient has no new complaints.  Reports feeling better.  Left arm still about the same able to lift but is reproducible pain which she attributes to her arthritis.  She is on chronic pain meds.  Discharge Exam: Vitals:   05/03/19 0349 05/03/19 0511  BP: (!) 104/46 (!) 119/52  Pulse: 78 83  Resp: 14   Temp: 98.5 F (36.9 C)   SpO2: 95% 100%   Vitals:   05/02/19 2023 05/02/19 2353 05/03/19 0349 05/03/19 0511  BP: (!) 107/51 (!) 111/57 (!) 104/46 (!) 119/52  Pulse: 69 73 78 83  Resp: _0 Temp: 98.3 F (36.8 C) 98.7 F (37.1 C) 98.5 F (36.9 C)   TempSrc: Oral Oral Oral   SpO2: 98% 96% 95% 100%  Weight:      Height:        General: Pt is alert, awake, not in acute distress Cardiovascular: RRR, S1/S2 +, no rubs, no gallops Respiratory: CTA bilaterally, no wheezing, no rhonchi Abdominal: Soft, NT, ND, bowel sounds + Extremities: no edema, no cyanosis Neuro exam: Alert oriented x3 no focal deficits   The results of significant diagnostics from this hospitalization (including imaging, microbiology, ancillary and laboratory) are listed below for reference.      Microbiology: Recent Results (from the past 240 hour(s))  SARS CORONAVIRUS 2 (TAT 6-24  HRS) Nasopharyngeal Nasopharyngeal Swab     Status: None   Collection Time: 05/01/19  2:25 PM   Specimen: Nasopharyngeal Swab  Result Value Ref Range Status   SARS Coronavirus 2 NEGATIVE NEGATIVE Final    Comment: (NOTE) SARS-CoV-2 target nucleic acids are NOT DETECTED. The SARS-CoV-2 RNA is generally detectable in upper and lower respiratory specimens during the acute phase of infection. Negative results do not preclude SARS-CoV-2 infection, do not rule out co-infections with other pathogens, and should not be used as the sole basis for treatment or other patient management decisions. Negative results must be combined with clinical observations, patient history, and epidemiological information. The expected result is Negative. Fact Sheet for Patients: SugarRoll.be Fact Sheet for Healthcare Providers: https://www.woods-mathews.com/ This test is not yet approved or cleared by the [place] FDA and  has been authorized for detection and/or diagnosis of SARS-CoV-2 by FDA under an Emergency Use Authorization (EUA). This EUA will remain  in effect (meaning this test can be used) for the duration of the COVID-19 declaration under Section 56 4(b)(1) of the Act, 21 U.S.C. section 360bbb-3(b)(1), unless the authorization is terminated or revoked sooner. Performed at [facility], [place] [address], [place], [place] [address]      Labs: BNP (last 3 results) No results for input(s): BNP in the last 8760 hours. Basic Metabolic Panel: Recent Labs  Lab 05/01/19 1315 05/01/19 1407 05/02/19 1121 05/03/19 0900  NA 136  --  134*  --   K 2.7*  --  3.3* 3.7  CL 107  --  104  --   CO2 21*  --  23  --   GLUCOSE 78  --  109*  --   BUN 10  --  9  --   CREATININE 0.97 0.92 1.08*  --   CALCIUM 7.5*  --  8.5*  --   MG  --  1.6*  --   --    Liver  Function Tests: Recent Labs  Lab 05/01/19 1315  AST 10*  ALT 8  ALKPHOS 86  BILITOT 0.5  PROT 5.6*  ALBUMIN 2.7*   No results for input(s): LIPASE, AMYLASE in the last 168 hours. No results for input(s): AMMONIA in the last 168 hours. CBC: Recent Labs  Lab 05/01/19 1155 05/01/19 1407  WBC 5.9 6.5  NEUTROABS 4.0  --   HGB 11.5* 10.6*  HCT 35.9* 32.0*  MCV 85.9 84.2  PLT 497* 459*   Cardiac Enzymes: No results for input(s): CKTOTAL, CKMB, CKMBINDEX, TROPONINI in the last 168 hours. BNP: Invalid input(s): POCBNP CBG: No results for input(s): GLUCAP in the last 168 hours. D-Dimer No results for input(s): DDIMER in the last 72 hours. Hgb A1c Recent Labs    05/02/19 0552  HGBA1C 5.6   Lipid Profile Recent Labs    05/02/19 0552  CHOL 109  HDL 34*  LDLCALC 59  TRIG 81  CHOLHDL 3.2   Thyroid function studies No results for input(s): TSH, T4TOTAL, T3FREE, THYROIDAB in the last 72 hours.  Invalid input(s): FREET3 Anemia work up No results for input(s): VITAMINB12, FOLATE, FERRITIN, TIBC, IRON, RETICCTPCT in the last 72 hours. Urinalysis    Component Value Date/Time   COLORURINE YELLOW (A) 05/01/2019 1317   APPEARANCEUR CLEAR (A) 05/01/2019 1317   LABSPEC 1.005 05/01/2019 1317   PHURINE 7.0 05/01/2019 [place] 05/01/2019 1317   HGBUR NEGATIVE 05/01/2019 [place] 05/01/2019 1317   [place] 05/01/2019 1317  PROTEINUR NEGATIVE 05/01/2019 1317   NITRITE NEGATIVE 05/01/2019 1317   LEUKOCYTESUR NEGATIVE 05/01/2019 1317   Sepsis Labs Invalid input(s): PROCALCITONIN,  WBC,  LACTICIDVEN Microbiology Recent Results (from the past 240 hour(s))  SARS CORONAVIRUS 2 (TAT 6-24 HRS) Nasopharyngeal Nasopharyngeal Swab     Status: None   Collection Time: 05/01/19  2:25 PM   Specimen: Nasopharyngeal Swab  Result Value Ref Range Status   SARS Coronavirus 2 NEGATIVE NEGATIVE Final    Comment: (NOTE) SARS-CoV-2 target nucleic  acids are NOT DETECTED. The SARS-CoV-2 RNA is generally detectable in upper and lower respiratory specimens during the acute phase of infection. Negative results do not preclude SARS-CoV-2 infection, do not rule out co-infections with other pathogens, and should not be used as the sole basis for treatment or other patient management decisions. Negative results must be combined with clinical observations, patient history, and epidemiological information. The expected result is Negative. Fact Sheet for Patients: SugarRoll.be Fact Sheet for Healthcare Providers: https://www.woods-mathews.com/ This test is not yet approved or cleared by the [place] FDA and  has been authorized for detection and/or diagnosis of SARS-CoV-2 by FDA under an Emergency Use Authorization (EUA). This EUA will remain  in effect (meaning this test can be used) for the duration of the COVID-19 declaration under Section 56 4(b)(1) of the Act, 21 U.S.C. section 360bbb-3(b)(1), unless the authorization is terminated or revoked sooner. Performed at [facility], [place] [address], [place], [place] [address]      Time coordinating discharge: Over 30 minutes  SIGNED:   Nolberto Hanlon, MD  [facility] 05/03/2019, 1:54 PM Pager   If 7PM-7AM, please contact night-coverage [email or web address] Password [email or web address]

## 2019-05-03 NOTE — TOC Transition Note (Signed)
Transition of Care [facility]) - CM/SW Discharge Note   Patient Details  Name: Jody Lowe MRN: [id number] Date of Birth: 14-Oct-1945  Transition of Care [facility]) CM/SW Contact:  Barrie Dunker, RN Phone Number: 05/03/2019, 2:06 PM   Clinical Narrative:    Patient provided a 3 in 1 by [employer] [facility], no HH set up as the patient states she does not need it. She will be going to her sister's house.  No Additional Needs   Final next level of care: Home/Self Care Barriers to Discharge: Barriers Resolved   Patient Goals and CMS Choice Patient states their goals for this hospitalization and ongoing recovery are:: going to her sisiters      Discharge Placement                       Discharge Plan and Services   Discharge Planning Services: CM Consult            DME Arranged: 3-N-1 DME Agency: [employer] Date DME Agency Contacted: 05/03/19 Time DME Agency Contacted: [phone] Representative spoke with at DME Agency: Nida Boatman HH Arranged: Patient Refused HH          Social Determinants of Health (SDOH) Interventions     Readmission Risk Interventions No flowsheet data found.

## 2019-05-03 NOTE — Progress Notes (Signed)
SLP Cancellation Note  Patient Details Name: Jody Lowe MRN: [id number] DOB: 08/28/1945   Cancelled treatment:       Reason Eval/Treat Not Completed: SLP screened, no needs identified, will sign off(chart reviewed; consulted NSG, MD then met w/ pt). Pt denied any difficulty swallowing and is currently on a regular diet; tolerates swallowing pills w/ water per NSG. Pt conversed in conversation w/ SLP about her diet, twin sister/home w/out deficits noted; pt and MD denied any speech-language deficits currently. No further skilled ST services indicated as pt appears at her baseline. Pt agreed. NSG to reconsult if any change in status while admitted.     Orinda Kenner, MS, CCC-SLP Emelee Rodocker 05/03/2019, 10:27 AM

## 2019-05-03 NOTE — Progress Notes (Signed)
Pt for discharge home/ pt to go  Stay with her sister in [place]. Alert / no resp distress.  Sl d/cd. Instructions discussed with pt. meds diet / activity and f/u. Pt verbalize understanding.  Waiting for ride to come pick her up.

## 2019-05-03 NOTE — Plan of Care (Signed)
  Problem: Education: Goal: Knowledge of General Education information will improve Description: Including pain rating scale, medication(s)/side effects and non-pharmacologic comfort measures Outcome: Progressing   Problem: Activity: Goal: Risk for activity intolerance will decrease Outcome: Progressing   Problem: Elimination: Goal: Will not experience complications related to bowel motility Outcome: Progressing Goal: Will not experience complications related to urinary retention Outcome: Progressing   Problem: Pain Managment: Goal: General experience of comfort will improve Outcome: Progressing   Problem: Safety: Goal: Ability to remain free from injury will improve Outcome: Progressing   Problem: Skin Integrity: Goal: Risk for impaired skin integrity will decrease Outcome: Progressing   Problem: Education: Goal: Knowledge of disease or condition will improve Outcome: Progressing Goal: Knowledge of secondary prevention will improve Outcome: Progressing Goal: Knowledge of patient specific risk factors addressed and post discharge goals established will improve Outcome: Progressing   Problem: Ischemic Stroke/TIA Tissue Perfusion: Goal: Complications of ischemic stroke/TIA will be minimized Outcome: Progressing

## 2019-08-20 DIAGNOSIS — N3946 Mixed incontinence: Secondary | ICD-10-CM | POA: Insufficient documentation

## 2019-09-20 ENCOUNTER — Emergency Department
Admission: EM | Admit: 2019-09-20 | Discharge: 2019-09-20 | Disposition: A | Discharge status: Home / Self Care | Payer: Medicare Other | Attending: Emergency Medicine | Admitting: Emergency Medicine

## 2019-09-20 ENCOUNTER — Emergency Department: Payer: Medicare Other

## 2019-09-20 ENCOUNTER — Other Ambulatory Visit: Payer: Self-pay

## 2019-09-20 ENCOUNTER — Encounter: Payer: Self-pay | Admitting: Emergency Medicine

## 2019-09-20 DIAGNOSIS — R519 Headache, unspecified: Secondary | ICD-10-CM | POA: Diagnosis not present

## 2019-09-20 DIAGNOSIS — I10 Essential (primary) hypertension: Secondary | ICD-10-CM | POA: Diagnosis not present

## 2019-09-20 DIAGNOSIS — R110 Nausea: Secondary | ICD-10-CM | POA: Diagnosis not present

## 2019-09-20 DIAGNOSIS — R42 Dizziness and giddiness: Secondary | ICD-10-CM | POA: Diagnosis not present

## 2019-09-20 LAB — URINALYSIS, COMPLETE (UACMP) WITH MICROSCOPIC
Bacteria, UA: NONE SEEN
Bilirubin Urine: NEGATIVE
Glucose, UA: NEGATIVE mg/dL
Hgb urine dipstick: NEGATIVE
Ketones, ur: NEGATIVE mg/dL
Leukocytes,Ua: NEGATIVE
Nitrite: NEGATIVE
Protein, ur: NEGATIVE mg/dL
Specific Gravity, Urine: 1.01 (ref 1.005–1.030)
pH: 6 (ref 5.0–8.0)

## 2019-09-20 LAB — BASIC METABOLIC PANEL
Anion gap: 12 (ref 5–15)
BUN: 12 mg/dL (ref 8–23)
CO2: 21 mmol/L — ABNORMAL LOW (ref 22–32)
Calcium: 8.9 mg/dL (ref 8.9–10.3)
Chloride: 101 mmol/L (ref 98–111)
Creatinine, Ser: 1.03 mg/dL — ABNORMAL HIGH (ref 0.44–1.00)
GFR calc Af Amer: >60 mL/min (ref >60)
GFR calc non Af Amer: 53 mL/min — ABNORMAL LOW (ref >60)
Glucose, Bld: 94 mg/dL (ref 70–99)
Potassium: 3.6 mmol/L (ref 3.5–5.1)
Sodium: 134 mmol/L — ABNORMAL LOW (ref 135–145)

## 2019-09-20 LAB — CBC
HCT: 33.5 % — ABNORMAL LOW (ref 36.0–46.0)
Hemoglobin: 10.7 g/dL — ABNORMAL LOW (ref 12.0–15.0)
MCH: 26.8 pg (ref 26.0–34.0)
MCHC: 31.9 g/dL (ref 30.0–36.0)
MCV: 84 fL (ref 80.0–100.0)
Platelets: 457 10*3/uL — ABNORMAL HIGH (ref 150–400)
RBC: 3.99 MIL/uL (ref 3.87–5.11)
RDW: 14.6 % (ref 11.5–15.5)
WBC: 5.7 10*3/uL (ref 4.0–10.5)
nRBC: 0 % (ref 0.0–0.2)

## 2019-09-20 MED ORDER — MECLIZINE HCL 25 MG PO TABS: 25.0000 mg | ORAL_TABLET | Freq: Three times a day (TID) | ORAL | 1 refills | Status: DC | PRN

## 2019-09-20 MED ORDER — MECLIZINE HCL 25 MG PO TABS
25.0000 mg | ORAL_TABLET | Freq: Once | ORAL | Status: AC
  Administered 2019-09-20: 25 mg via ORAL
  Filled 2019-09-20: qty 1

## 2019-09-20 NOTE — ED Triage Notes (Addendum)
Patient presents to the ED with dizziness and headache since Sunday.  Patient states, "It feels like the wall is shimmering."  Patient states she feels better when her eyes are closed.  Patient reports nausea but denies vomiting.  Patient states she tripped in her bathroom on Thursday and hit her head on the bathtub.  Denies neck pain.

## 2019-09-20 NOTE — ED Provider Notes (Signed)
ER Provider Note       Time seen: 3:47 PM    I have reviewed the vital signs and the nursing notes.  HISTORY   Chief Complaint Dizziness   HPI Jody Lowe is a 74 y.o. female with a history of anemia, hypertension, peripheral vascular disease, renal disorder who presents today for dizziness and headache since Sunday.  Patient states it feels like the wall as shimmering.  Patient states she feels better when her eyes are closed, reports nausea but no vomiting.  She hit her head on Thursday, denies any back pain.  She states the room spins when she opens her eyes.  Past Medical History:  Diagnosis Date   Anemia    Hypertension    Peripheral artery disease ([place])    Renal disorder     History reviewed. No pertinent surgical history.  Allergies Gabapentin and Plavix [clopidogrel bisulfate]  Review of Systems Constitutional: Negative for fever. Cardiovascular: Negative for chest pain. Respiratory: Negative for shortness of breath. Gastrointestinal: Negative for abdominal pain, vomiting and diarrhea. Musculoskeletal: Negative for back pain. Skin: Negative for rash. Neurological: Negative for headaches, focal weakness or numbness.  Positive for dizziness  All systems negative/normal/unremarkable except as stated in the HPI  ____________________________________________   PHYSICAL EXAM:  VITAL SIGNS: Vitals:   09/20/19 1306  BP: (!) 124/57  Pulse: 82  Resp: 18  Temp: 98.8 F (37.1 C)  SpO2: 100%    Constitutional: Alert and oriented. Well appearing and in no distress. Eyes: Conjunctivae are normal. Normal extraocular movements. ENT      Head: Normocephalic and atraumatic.      Nose: No congestion/rhinnorhea.      Mouth/Throat: Mucous membranes are moist.      Neck: No stridor. Cardiovascular: Normal rate, regular rhythm. No murmurs, rubs, or gallops. Respiratory: Normal respiratory effort without tachypnea nor retractions. Breath sounds are clear and  equal bilaterally. No wheezes/rales/rhonchi. Gastrointestinal: Soft and nontender. Normal bowel sounds Musculoskeletal: Nontender with normal range of motion in extremities. No lower extremity tenderness nor edema. Neurologic:  Normal speech and language. No gross focal neurologic deficits are appreciated.  Skin:  Skin is warm, dry and intact. No rash noted. Psychiatric: Speech and behavior are normal.  ____________________________________________  EKG: Interpreted by me.  Sinus rhythm with a rate of 78 bpm, normal PR interval, nonspecific ST segment changes, normal QT  ____________________________________________   LABS (pertinent positives/negatives)  Labs Reviewed  BASIC METABOLIC PANEL - Abnormal; Notable for the following components:      Result Value   Sodium 134 (*)    CO2 21 (*)    Creatinine, Ser 1.03 (*)    GFR calc non Af Amer 53 (*)    All other components within normal limits  CBC - Abnormal; Notable for the following components:   Hemoglobin 10.7 (*)    HCT 33.5 (*)    Platelets 457 (*)    All other components within normal limits  URINALYSIS, COMPLETE (UACMP) WITH MICROSCOPIC - Abnormal; Notable for the following components:   Color, Urine YELLOW (*)    APPearance HAZY (*)    All other components within normal limits    RADIOLOGY  CT head IMPRESSION: No acute intracranial abnormality.  Degenerative changes C1-2 with mild spinal stenosis, incompletely evaluated.  DIFFERENTIAL DIAGNOSIS  Peripheral vertigo, central vertigo, dehydration, electrolyte abnormality, occult infection, concussion, subdural  ASSESSMENT AND PLAN  Peripheral vertigo   Plan: The patient had presented for dizziness and headache. Patient's labs were  grossly unremarkable.  Symptoms resemble vertigo and her symptoms have resolved at this point.  I will prescribe meclizine for her to take at home.  She is cleared for outpatient follow-up.  Daryel November MD    Note: This note  was generated in part or whole with voice recognition software. Voice recognition is usually quite accurate but there are transcription errors that can and very often do occur. I apologize for any typographical errors that were not detected and corrected.     Emily Filbert, MD 09/20/19 [phone]

## 2019-09-20 NOTE — ED Triage Notes (Signed)
First nurse note- dizziness started over weekend.  Room spins when opens eyes.  VSS with EMS.

## 2019-11-05 ENCOUNTER — Other Ambulatory Visit: Payer: Self-pay

## 2019-11-09 ENCOUNTER — Other Ambulatory Visit: Payer: Self-pay

## 2019-11-09 ENCOUNTER — Other Ambulatory Visit
Admission: RE | Admit: 2019-11-09 | Discharge: 2019-11-09 | Disposition: A | Discharge status: Home / Self Care | Payer: Medicare Other | Source: Ambulatory Visit | Attending: Ophthalmology | Admitting: Ophthalmology

## 2019-11-09 DIAGNOSIS — Z20822 Contact with and (suspected) exposure to covid-19: Secondary | ICD-10-CM | POA: Insufficient documentation

## 2019-11-09 DIAGNOSIS — Z01812 Encounter for preprocedural laboratory examination: Secondary | ICD-10-CM | POA: Insufficient documentation

## 2019-11-09 NOTE — Discharge Instructions (Signed)

## 2019-11-10 LAB — SARS CORONAVIRUS 2 (TAT 6-24 HRS): SARS Coronavirus 2: NEGATIVE

## 2019-11-11 ENCOUNTER — Ambulatory Visit: Payer: Medicare Other | Admitting: Anesthesiology

## 2019-11-11 ENCOUNTER — Ambulatory Visit
Admission: RE | Admit: 2019-11-11 | Discharge: 2019-11-11 | Disposition: A | Discharge status: Home / Self Care | Payer: Medicare Other | Attending: Ophthalmology | Admitting: Ophthalmology

## 2019-11-11 ENCOUNTER — Encounter
Admission: RE | Disposition: A | Discharge status: Home / Self Care | Payer: Self-pay | Source: Home / Self Care | Attending: Ophthalmology

## 2019-11-11 ENCOUNTER — Encounter: Payer: Self-pay | Admitting: Ophthalmology

## 2019-11-11 ENCOUNTER — Other Ambulatory Visit: Payer: Self-pay

## 2019-11-11 DIAGNOSIS — M810 Age-related osteoporosis without current pathological fracture: Secondary | ICD-10-CM | POA: Diagnosis not present

## 2019-11-11 DIAGNOSIS — E7800 Pure hypercholesterolemia, unspecified: Secondary | ICD-10-CM | POA: Insufficient documentation

## 2019-11-11 DIAGNOSIS — Z7983 Long term (current) use of bisphosphonates: Secondary | ICD-10-CM | POA: Diagnosis not present

## 2019-11-11 DIAGNOSIS — I739 Peripheral vascular disease, unspecified: Secondary | ICD-10-CM | POA: Insufficient documentation

## 2019-11-11 DIAGNOSIS — K219 Gastro-esophageal reflux disease without esophagitis: Secondary | ICD-10-CM | POA: Insufficient documentation

## 2019-11-11 DIAGNOSIS — I10 Essential (primary) hypertension: Secondary | ICD-10-CM | POA: Diagnosis not present

## 2019-11-11 DIAGNOSIS — H2512 Age-related nuclear cataract, left eye: Secondary | ICD-10-CM | POA: Diagnosis not present

## 2019-11-11 DIAGNOSIS — Z8673 Personal history of transient ischemic attack (TIA), and cerebral infarction without residual deficits: Secondary | ICD-10-CM | POA: Insufficient documentation

## 2019-11-11 DIAGNOSIS — Z79899 Other long term (current) drug therapy: Secondary | ICD-10-CM | POA: Diagnosis not present

## 2019-11-11 HISTORY — PX: CATARACT EXTRACTION W/PHACO: SHX586

## 2019-11-11 SURGERY — PHACOEMULSIFICATION, CATARACT, WITH IOL INSERTION
Anesthesia: Monitor Anesthesia Care | Site: Eye | Laterality: Left

## 2019-11-11 MED ORDER — PROVISC 10 MG/ML IO SOLN
INTRAOCULAR | Status: DC | PRN
  Administered 2019-11-11: 0.55 mL via INTRAOCULAR

## 2019-11-11 MED ORDER — TETRACAINE HCL 0.5 % OP SOLN
1.0000 [drp] | OPHTHALMIC | Status: DC | PRN
  Administered 2019-11-11 (×3): 1 [drp] via OPHTHALMIC

## 2019-11-11 MED ORDER — TETRACAINE 0.5 % OP SOLN OPTIME - NO CHARGE
OPHTHALMIC | Status: DC | PRN
  Administered 2019-11-11: 1 [drp] via OPHTHALMIC

## 2019-11-11 MED ORDER — ACETAMINOPHEN 160 MG/5ML PO SOLN: 325.0000 mg | Freq: Once | ORAL | Status: DC

## 2019-11-11 MED ORDER — NA CHONDROIT SULF-NA HYALURON 40-17 MG/ML IO SOLN
INTRAOCULAR | Status: DC | PRN
  Administered 2019-11-11: 1 mL via INTRAOCULAR

## 2019-11-11 MED ORDER — EPINEPHRINE PF 1 MG/ML IJ SOLN
INTRAOCULAR | Status: DC | PRN
  Administered 2019-11-11: 56 mL via OPHTHALMIC

## 2019-11-11 MED ORDER — TRYPAN BLUE 0.06 % OP SOLN
OPHTHALMIC | Status: DC | PRN
  Administered 2019-11-11: 0.5 mL via INTRAOCULAR

## 2019-11-11 MED ORDER — ARMC OPHTHALMIC DILATING DROPS
1.0000 "application " | OPHTHALMIC | Status: DC | PRN
  Administered 2019-11-11 (×3): 1 via OPHTHALMIC

## 2019-11-11 MED ORDER — ACETAMINOPHEN 325 MG PO TABS: 325.0000 mg | ORAL_TABLET | Freq: Once | ORAL | Status: DC

## 2019-11-11 MED ORDER — MIDAZOLAM HCL 2 MG/2ML IJ SOLN
INTRAMUSCULAR | Status: DC | PRN
  Administered 2019-11-11: 1 mg via INTRAVENOUS

## 2019-11-11 MED ORDER — FENTANYL CITRATE (PF) 100 MCG/2ML IJ SOLN
INTRAMUSCULAR | Status: DC | PRN
Start: 2019-11-11 — End: 2019-11-11
  Administered 2019-11-11: 50 ug via INTRAVENOUS
  Administered 2019-11-11: 25 ug via INTRAVENOUS

## 2019-11-11 MED ORDER — BRIMONIDINE TARTRATE-TIMOLOL 0.2-0.5 % OP SOLN
OPHTHALMIC | Status: DC | PRN
  Administered 2019-11-11: 1 [drp] via OPHTHALMIC

## 2019-11-11 MED ORDER — MOXIFLOXACIN HCL 0.5 % OP SOLN
OPHTHALMIC | Status: DC | PRN
  Administered 2019-11-11: 0.2 mL via OPHTHALMIC

## 2019-11-11 MED ORDER — LIDOCAINE HCL (PF) 2 % IJ SOLN
INTRAOCULAR | Status: DC | PRN
  Administered 2019-11-11: 1 mL via INTRAOCULAR

## 2019-11-11 MED ORDER — LACTATED RINGERS IV SOLN: INTRAVENOUS | Status: DC

## 2019-11-11 SURGICAL SUPPLY — 20 items
DISSECTOR HYDRO NUCLEUS 50X22 (MISCELLANEOUS) ×12 IMPLANT
DRSG TEGADERM 2-3/8X2-3/4 SM (GAUZE/BANDAGES/DRESSINGS) ×3 IMPLANT
GLOVE BIOGEL PI IND STRL 8 (GLOVE) ×1 IMPLANT
GLOVE BIOGEL PI INDICATOR 8 (GLOVE) ×4
GOWN STRL REUS W/ TWL LRG LVL3 (GOWN DISPOSABLE) ×1 IMPLANT
GOWN STRL REUS W/ TWL XL LVL3 (GOWN DISPOSABLE) ×1 IMPLANT
GOWN STRL REUS W/TWL LRG LVL3 (GOWN DISPOSABLE) ×3
GOWN STRL REUS W/TWL XL LVL3 (GOWN DISPOSABLE) ×3
KNIFE 45D UP 2.3 (MISCELLANEOUS) ×3 IMPLANT
LENS IOL DIOP 21.5 (Intraocular Lens) ×3 IMPLANT
LENS IOL TECNIS MONO 21.5 (Intraocular Lens) IMPLANT
MARKER SKIN DUAL TIP RULER LAB (MISCELLANEOUS) ×3 IMPLANT
NDL CAPSULORHEX 25GA (NEEDLE) ×1 IMPLANT
NEEDLE CAPSULORHEX 25GA (NEEDLE) ×3 IMPLANT
PACK CATARACT (MISCELLANEOUS) ×3 IMPLANT
PACK DR. KING ARMS (PACKS) ×3 IMPLANT
PACK EYE AFTER SURG (MISCELLANEOUS) ×3 IMPLANT
SOLUTION OPHTHALMIC SALT (MISCELLANEOUS) ×3 IMPLANT
WATER STERILE IRR 250ML POUR (IV SOLUTION) ×3 IMPLANT
WIPE NON LINTING 3.25X3.25 (MISCELLANEOUS) ×3 IMPLANT

## 2019-11-11 NOTE — Anesthesia Postprocedure Evaluation (Signed)
Anesthesia Post Note  Patient: KEAIRA WHITEHURST  Procedure(s) Performed: CATARACT EXTRACTION PHACO AND INTRAOCULAR LENS PLACEMENT ([place]) LEFT VISION BLUE (Left Eye)     Patient location during evaluation: PACU Anesthesia Type: MAC Level of consciousness: awake and alert and oriented Pain management: satisfactory to patient Vital Signs Assessment: post-procedure vital signs reviewed and stable Respiratory status: spontaneous breathing, nonlabored ventilation and respiratory function stable Cardiovascular status: blood pressure returned to baseline and stable Postop Assessment: Adequate PO intake and No signs of nausea or vomiting Anesthetic complications: no   No complications documented.  Raliegh Ip

## 2019-11-11 NOTE — H&P (Signed)
   I have reviewed the patient's H&P and agree with its findings. There have been no interval changes.  Adrieana Fennelly MD Ophthalmology 

## 2019-11-11 NOTE — Transfer of Care (Signed)
Immediate Anesthesia Transfer of Care Note  Patient: Jody Lowe  Procedure(s) Performed: CATARACT EXTRACTION PHACO AND INTRAOCULAR LENS PLACEMENT ([place]) LEFT VISION BLUE (Left Eye)  Patient Location: PACU  Anesthesia Type: MAC  Level of Consciousness: awake, alert  and patient cooperative  Airway and Oxygen Therapy: Patient Spontanous Breathing and Patient connected to supplemental oxygen  Post-op Assessment: Post-op Vital signs reviewed, Patient's Cardiovascular Status Stable, Respiratory Function Stable, Patent Airway and No signs of Nausea or vomiting  Post-op Vital Signs: Reviewed and stable  Complications: No complications documented.

## 2019-11-11 NOTE — Anesthesia Preprocedure Evaluation (Signed)
Anesthesia Evaluation  Patient identified by MRN, date of birth, ID band Patient awake    Reviewed: Allergy & Precautions, H&P , NPO status , Patient's Chart, lab work & pertinent test results  Airway Mallampati: II  TM Distance: >3 FB Neck ROM: full    Dental no notable dental hx.    Pulmonary Current Smoker and Patient abstained from smoking.,    Pulmonary exam normal breath sounds clear to auscultation       Cardiovascular hypertension, + Peripheral Vascular Disease  Normal cardiovascular exam Rhythm:regular Rate:Normal     Neuro/Psych PSYCHIATRIC DISORDERS TIA   GI/Hepatic   Endo/Other    Renal/GU      Musculoskeletal   Abdominal   Peds  Hematology   Anesthesia Other Findings   Reproductive/Obstetrics                             Anesthesia Physical Anesthesia Plan  ASA: III  Anesthesia Plan: MAC   Post-op Pain Management:    Induction:   PONV Risk Score and Plan: 2 and Treatment may vary due to age or medical condition, TIVA and Midazolam  Airway Management Planned:   Additional Equipment:   Intra-op Plan:   Post-operative Plan:   Informed Consent: I have reviewed the patients History and Physical, chart, labs and discussed the procedure including the risks, benefits and alternatives for the proposed anesthesia with the patient or authorized representative who has indicated his/her understanding and acceptance.     Dental Advisory Given  Plan Discussed with: CRNA  Anesthesia Plan Comments:         Anesthesia Quick Evaluation

## 2019-11-11 NOTE — Anesthesia Procedure Notes (Signed)
Procedure Name: MAC Performed by: Zohan Shiflet, CRNA Pre-anesthesia Checklist: Patient identified, Emergency Drugs available, Suction available, Timeout performed and Patient being monitored Patient Re-evaluated:Patient Re-evaluated prior to induction Oxygen Delivery Method: Nasal cannula Placement Confirmation: positive ETCO2       

## 2019-11-11 NOTE — Op Note (Signed)
  PREOPERATIVE DIAGNOSIS:  Nuclear sclerotic cataract of the LEFT eye.   POSTOPERATIVE DIAGNOSIS:  Nuclear sclerotic cataract of the LEFT eye.   OPERATIVE PROCEDURE: Cataract surgery OS   SURGEON:  Elliot Cousin, MD.   ANESTHESIA:  Anesthesiologist: Ranee Gosselin, MD CRNA: Jimmy Picket, CRNA  1.      Managed anesthesia care. 2.     0.43ml of Shugarcaine was instilled following the paracentesis   COMPLICATIONS:  None.   TECHNIQUE:   Divide and conquer   DESCRIPTION OF PROCEDURE:  The patient was examined and consented in the preoperative holding area where the aforementioned topical anesthesia was applied to the LEFT eye and then brought back to the Operating Room where the left eye was prepped and draped in the usual sterile ophthalmic fashion and a lid speculum was placed. A paracentesis was created with the side port blade, the anterior chamber was washed out with trypan blue to stain the anterior capsule, and the anterior chamber was filled with viscoelastic. A near clear corneal incision was performed with the steel keratome. A continuous curvilinear capsulorrhexis was performed with a cystotome followed by the capsulorrhexis forceps. Hydrodissection and hydrodelineation were carried out with BSS on a blunt cannula. The lens was removed in a divide and conquer  technique and the remaining cortical material was removed with the irrigation-aspiration handpiece. The capsular bag was inflated with viscoelastic and the lens was placed in the capsular bag without complication. The remaining viscoelastic was removed from the eye with the irrigation-aspiration handpiece. The wounds were hydrated. The anterior chamber was flushed and the eye was inflated to physiologic pressure. 0.9ml Vigamox was placed in the anterior chamber. The wounds were found to be water tight. The eye was dressed with Vigamox. The patient was given protective glasses to wear throughout the day and a shield with which to  sleep tonight. The patient was also given drops with which to begin a drop regimen today and will follow-up with me in one day. Implant Name Type Inv. Item Serial No. Manufacturer Lot No. LRB No. Used Action  LENS IOL DIOP 21.5 - [id number] Intraocular Lens LENS IOL DIOP 21.5 [id number] [employer]  Left 1 Implanted    Procedure(s) with comments: CATARACT EXTRACTION PHACO AND INTRAOCULAR LENS PLACEMENT ([facility]) LEFT VISION BLUE (Left) - 7.94 0:42.4  Electronically signed: Elliot Cousin 11/11/2019 9:27 AM

## 2019-11-12 ENCOUNTER — Encounter: Payer: Self-pay | Admitting: Ophthalmology

## 2019-12-14 ENCOUNTER — Encounter: Payer: Self-pay | Admitting: Ophthalmology

## 2019-12-21 ENCOUNTER — Other Ambulatory Visit
Admission: RE | Admit: 2019-12-21 | Discharge: 2019-12-21 | Disposition: A | Discharge status: Home / Self Care | Payer: Medicare Other | Source: Ambulatory Visit | Attending: Ophthalmology | Admitting: Ophthalmology

## 2019-12-21 ENCOUNTER — Other Ambulatory Visit: Payer: Self-pay

## 2019-12-21 DIAGNOSIS — Z01812 Encounter for preprocedural laboratory examination: Secondary | ICD-10-CM | POA: Diagnosis present

## 2019-12-21 DIAGNOSIS — Z20822 Contact with and (suspected) exposure to covid-19: Secondary | ICD-10-CM | POA: Diagnosis not present

## 2019-12-21 LAB — SARS CORONAVIRUS 2 (TAT 6-24 HRS): SARS Coronavirus 2: NEGATIVE

## 2019-12-21 NOTE — Discharge Instructions (Signed)

## 2019-12-23 ENCOUNTER — Ambulatory Visit: Payer: Medicare Other | Admitting: Anesthesiology

## 2019-12-23 ENCOUNTER — Encounter
Admission: RE | Disposition: A | Discharge status: Home / Self Care | Payer: Self-pay | Source: Home / Self Care | Attending: Ophthalmology

## 2019-12-23 ENCOUNTER — Ambulatory Visit
Admission: RE | Admit: 2019-12-23 | Discharge: 2019-12-23 | Disposition: A | Discharge status: Home / Self Care | Payer: Medicare Other | Attending: Ophthalmology | Admitting: Ophthalmology

## 2019-12-23 ENCOUNTER — Encounter: Payer: Self-pay | Admitting: Ophthalmology

## 2019-12-23 DIAGNOSIS — F17200 Nicotine dependence, unspecified, uncomplicated: Secondary | ICD-10-CM | POA: Insufficient documentation

## 2019-12-23 DIAGNOSIS — M7989 Other specified soft tissue disorders: Secondary | ICD-10-CM | POA: Diagnosis not present

## 2019-12-23 DIAGNOSIS — H2511 Age-related nuclear cataract, right eye: Secondary | ICD-10-CM | POA: Insufficient documentation

## 2019-12-23 DIAGNOSIS — R42 Dizziness and giddiness: Secondary | ICD-10-CM | POA: Diagnosis not present

## 2019-12-23 DIAGNOSIS — Z90710 Acquired absence of both cervix and uterus: Secondary | ICD-10-CM | POA: Insufficient documentation

## 2019-12-23 DIAGNOSIS — I739 Peripheral vascular disease, unspecified: Secondary | ICD-10-CM | POA: Insufficient documentation

## 2019-12-23 DIAGNOSIS — I10 Essential (primary) hypertension: Secondary | ICD-10-CM | POA: Diagnosis not present

## 2019-12-23 DIAGNOSIS — E7800 Pure hypercholesterolemia, unspecified: Secondary | ICD-10-CM | POA: Diagnosis not present

## 2019-12-23 DIAGNOSIS — M810 Age-related osteoporosis without current pathological fracture: Secondary | ICD-10-CM | POA: Insufficient documentation

## 2019-12-23 DIAGNOSIS — Z8673 Personal history of transient ischemic attack (TIA), and cerebral infarction without residual deficits: Secondary | ICD-10-CM | POA: Diagnosis not present

## 2019-12-23 DIAGNOSIS — Z888 Allergy status to other drugs, medicaments and biological substances status: Secondary | ICD-10-CM | POA: Diagnosis not present

## 2019-12-23 DIAGNOSIS — Z9842 Cataract extraction status, left eye: Secondary | ICD-10-CM | POA: Diagnosis not present

## 2019-12-23 HISTORY — PX: CATARACT EXTRACTION W/PHACO: SHX586

## 2019-12-23 SURGERY — PHACOEMULSIFICATION, CATARACT, WITH IOL INSERTION
Anesthesia: Monitor Anesthesia Care | Site: Eye | Laterality: Right

## 2019-12-23 MED ORDER — MOXIFLOXACIN HCL 0.5 % OP SOLN
OPHTHALMIC | Status: DC | PRN
  Administered 2019-12-23: 0.2 mL via OPHTHALMIC

## 2019-12-23 MED ORDER — NA CHONDROIT SULF-NA HYALURON 40-17 MG/ML IO SOLN
INTRAOCULAR | Status: DC | PRN
  Administered 2019-12-23: 1 mL via INTRAOCULAR

## 2019-12-23 MED ORDER — BRIMONIDINE TARTRATE-TIMOLOL 0.2-0.5 % OP SOLN
OPHTHALMIC | Status: DC | PRN
  Administered 2019-12-23: 1 [drp] via OPHTHALMIC

## 2019-12-23 MED ORDER — ARMC OPHTHALMIC DILATING DROPS
1.0000 "application " | OPHTHALMIC | Status: DC | PRN
  Administered 2019-12-23 (×3): 1 via OPHTHALMIC

## 2019-12-23 MED ORDER — ACETAMINOPHEN 325 MG PO TABS: 325.0000 mg | ORAL_TABLET | Freq: Once | ORAL | Status: DC

## 2019-12-23 MED ORDER — FENTANYL CITRATE (PF) 100 MCG/2ML IJ SOLN
INTRAMUSCULAR | Status: DC | PRN
Start: 2019-12-23 — End: 2019-12-23
  Administered 2019-12-23: 50 ug via INTRAVENOUS

## 2019-12-23 MED ORDER — MIDAZOLAM HCL 2 MG/2ML IJ SOLN
INTRAMUSCULAR | Status: DC | PRN
  Administered 2019-12-23: 1 mg via INTRAVENOUS

## 2019-12-23 MED ORDER — TRYPAN BLUE 0.06 % OP SOLN
OPHTHALMIC | Status: DC | PRN
  Administered 2019-12-23: 0.5 mL via INTRAOCULAR

## 2019-12-23 MED ORDER — TETRACAINE HCL 0.5 % OP SOLN
1.0000 [drp] | OPHTHALMIC | Status: DC | PRN
  Administered 2019-12-23 (×3): 1 [drp] via OPHTHALMIC

## 2019-12-23 MED ORDER — LACTATED RINGERS IV SOLN: INTRAVENOUS | Status: DC

## 2019-12-23 MED ORDER — PROVISC 10 MG/ML IO SOLN
INTRAOCULAR | Status: DC | PRN
  Administered 2019-12-23: 0.55 mL via INTRAOCULAR

## 2019-12-23 MED ORDER — TETRACAINE 0.5 % OP SOLN OPTIME - NO CHARGE
OPHTHALMIC | Status: DC | PRN
  Administered 2019-12-23: 2 [drp] via OPHTHALMIC

## 2019-12-23 MED ORDER — ACETAMINOPHEN 160 MG/5ML PO SOLN: 325.0000 mg | Freq: Once | ORAL | Status: DC

## 2019-12-23 MED ORDER — EPINEPHRINE PF 1 MG/ML IJ SOLN
INTRAOCULAR | Status: DC | PRN
  Administered 2019-12-23: 58 mL via OPHTHALMIC

## 2019-12-23 MED ORDER — LIDOCAINE HCL (PF) 2 % IJ SOLN
INTRAOCULAR | Status: DC | PRN
  Administered 2019-12-23: 1 mL via INTRAOCULAR

## 2019-12-23 SURGICAL SUPPLY — 19 items
DISSECTOR HYDRO NUCLEUS 50X22 (MISCELLANEOUS) ×12 IMPLANT
DRSG TEGADERM 2-3/8X2-3/4 SM (GAUZE/BANDAGES/DRESSINGS) ×3 IMPLANT
GLOVE BIOGEL PI IND STRL 8 (GLOVE) ×1 IMPLANT
GLOVE BIOGEL PI INDICATOR 8 (GLOVE) ×2
GOWN STRL REUS W/ TWL LRG LVL3 (GOWN DISPOSABLE) ×1 IMPLANT
GOWN STRL REUS W/ TWL XL LVL3 (GOWN DISPOSABLE) ×1 IMPLANT
GOWN STRL REUS W/TWL LRG LVL3 (GOWN DISPOSABLE) ×3
GOWN STRL REUS W/TWL XL LVL3 (GOWN DISPOSABLE) ×3
KNIFE 45D UP 2.3 (MISCELLANEOUS) ×3 IMPLANT
LENS IOL DIOP 20.5 (Intraocular Lens) ×3 IMPLANT
LENS IOL TECNIS MONO 20.5 (Intraocular Lens) ×1 IMPLANT
MARKER SKIN DUAL TIP RULER LAB (MISCELLANEOUS) ×3 IMPLANT
NEEDLE CAPSULORHEX 25GA (NEEDLE) ×3 IMPLANT
PACK CATARACT (MISCELLANEOUS) ×3 IMPLANT
PACK DR. KING ARMS (PACKS) ×3 IMPLANT
PACK EYE AFTER SURG (MISCELLANEOUS) ×3 IMPLANT
SOLUTION OPHTHALMIC SALT (MISCELLANEOUS) ×3 IMPLANT
WATER STERILE IRR 250ML POUR (IV SOLUTION) ×3 IMPLANT
WIPE NON LINTING 3.25X3.25 (MISCELLANEOUS) ×3 IMPLANT

## 2019-12-23 NOTE — Anesthesia Procedure Notes (Signed)
Procedure Name: MAC Date/Time: 12/23/2019 12:33 PM Performed by: Cameron Ali, CRNA Pre-anesthesia Checklist: Patient identified, Emergency Drugs available, Suction available, Timeout performed and Patient being monitored Patient Re-evaluated:Patient Re-evaluated prior to induction Oxygen Delivery Method: Nasal cannula Placement Confirmation: positive ETCO2

## 2019-12-23 NOTE — Op Note (Signed)
  PREOPERATIVE DIAGNOSIS:  Nuclear sclerotic cataract of the RIGHT eye.   POSTOPERATIVE DIAGNOSIS:  Nuclear sclerotic cataract of the RIGHT eye.   OPERATIVE PROCEDURE: Cataract surgery OD   SURGEON:  Elliot Cousin, MD.   ANESTHESIA:  CRNA: Maree Krabbe, CRNA  1.      Managed anesthesia care. 2.     0.78ml of Shugarcaine was instilled following the paracentesis   COMPLICATIONS:  None.   TECHNIQUE:   Divide and conquer   DESCRIPTION OF PROCEDURE:  The patient was examined and consented in the preoperative holding area where the aforementioned topical anesthesia was applied to the RIGHT eye and then brought back to the Operating Room where the RIGHT eye was prepped and draped in the usual sterile ophthalmic fashion and a lid speculum was placed. A paracentesis was created with the side port blade, the anterior chamber was washed out with trypan blue to stain the anterior capsule, and the anterior chamber was filled with viscoelastic. A near clear corneal incision was performed with the steel keratome. A continuous curvilinear capsulorrhexis was performed with a cystotome followed by the capsulorrhexis forceps. Hydrodissection and hydrodelineation were carried out with BSS on a blunt cannula. The lens was removed in a divide and conquer  technique and the remaining cortical material was removed with the irrigation-aspiration handpiece. The capsular bag was inflated with viscoelastic and the lens was placed in the capsular bag without complication. The remaining viscoelastic was removed from the eye with the irrigation-aspiration handpiece. The wounds were hydrated. The anterior chamber was flushed and the eye was inflated to physiologic pressure. 0.2ml Vigamox was placed in the anterior chamber. The wounds were found to be water tight. The eye was dressed with Vigamox. The patient was given protective glasses to wear throughout the day and a shield with which to sleep tonight. The patient was also given  drops with which to begin a drop regimen today and will follow-up with me in one day. Implant Name Type Inv. Item Serial No. Manufacturer Lot No. LRB No. Used Action  LENS IOL DIOP 20.5 - [id number] Intraocular Lens LENS IOL DIOP 20.5 [id number] [employer]   Right 1 Implanted    Procedure(s): CATARACT EXTRACTION PHACO AND INTRAOCULAR LENS PLACEMENT ([facility]) RIGHT VISION BLUE 5.47  00:31.1 (Right)  Electronically signed: Tyshia Fenter 12/23/2019 1:01 PM

## 2019-12-23 NOTE — H&P (Signed)
   I have reviewed the patient's H&P and agree with its findings. There have been no interval changes.  Clariza Sickman MD Ophthalmology 

## 2019-12-23 NOTE — Anesthesia Preprocedure Evaluation (Signed)
Anesthesia Evaluation  Patient identified by MRN, date of birth, ID band Patient awake    Reviewed: Allergy & Precautions, H&P , NPO status , Patient's Chart, lab work & pertinent test results  Airway Mallampati: III  TM Distance: >3 FB Neck ROM: full    Dental  (+) Upper Dentures, Lower Dentures   Pulmonary Current SmokerPatient did not abstain from smoking.,    Pulmonary exam normal breath sounds clear to auscultation       Cardiovascular hypertension, + Peripheral Vascular Disease  Normal cardiovascular exam Rhythm:regular Rate:Normal     Neuro/Psych PSYCHIATRIC DISORDERS TIA   GI/Hepatic   Endo/Other    Renal/GU      Musculoskeletal   Abdominal   Peds  Hematology   Anesthesia Other Findings   Reproductive/Obstetrics                             Anesthesia Physical  Anesthesia Plan  ASA: III  Anesthesia Plan: MAC   Post-op Pain Management:    Induction:   PONV Risk Score and Plan: 2 and Treatment may vary due to age or medical condition, TIVA and Midazolam  Airway Management Planned:   Additional Equipment:   Intra-op Plan:   Post-operative Plan:   Informed Consent: I have reviewed the patients History and Physical, chart, labs and discussed the procedure including the risks, benefits and alternatives for the proposed anesthesia with the patient or authorized representative who has indicated his/her understanding and acceptance.     Dental Advisory Given  Plan Discussed with: CRNA  Anesthesia Plan Comments:         Anesthesia Quick Evaluation

## 2019-12-23 NOTE — Transfer of Care (Signed)
Immediate Anesthesia Transfer of Care Note  Patient: Jody Lowe  Procedure(s) Performed: CATARACT EXTRACTION PHACO AND INTRAOCULAR LENS PLACEMENT ([facility]) RIGHT VISION BLUE 5.47  00:31.1 (Right Eye)  Patient Location: PACU  Anesthesia Type: MAC  Level of Consciousness: awake, alert  and patient cooperative  Airway and Oxygen Therapy: Patient Spontanous Breathing and Patient connected to supplemental oxygen  Post-op Assessment: Post-op Vital signs reviewed, Patient's Cardiovascular Status Stable, Respiratory Function Stable, Patent Airway and No signs of Nausea or vomiting  Post-op Vital Signs: Reviewed and stable  Complications: No complications documented.

## 2019-12-23 NOTE — Anesthesia Postprocedure Evaluation (Signed)
Anesthesia Post Note  Patient: Jody Lowe  Procedure(s) Performed: CATARACT EXTRACTION PHACO AND INTRAOCULAR LENS PLACEMENT ([facility]) RIGHT VISION BLUE 5.47  00:31.1 (Right Eye)     Patient location during evaluation: PACU Anesthesia Type: MAC Level of consciousness: awake and alert and oriented Pain management: satisfactory to patient Vital Signs Assessment: post-procedure vital signs reviewed and stable Respiratory status: spontaneous breathing, nonlabored ventilation and respiratory function stable Cardiovascular status: blood pressure returned to baseline and stable Postop Assessment: Adequate PO intake and No signs of nausea or vomiting Anesthetic complications: no   No complications documented.  Raliegh Ip

## 2020-02-22 ENCOUNTER — Emergency Department
Admission: EM | Admit: 2020-02-22 | Discharge: 2020-02-22 | Disposition: A | Discharge status: Home / Self Care | Payer: Medicare Other | Attending: Emergency Medicine | Admitting: Emergency Medicine

## 2020-02-22 ENCOUNTER — Other Ambulatory Visit: Payer: Self-pay

## 2020-02-22 ENCOUNTER — Encounter: Payer: Self-pay | Admitting: Emergency Medicine

## 2020-02-22 ENCOUNTER — Emergency Department: Payer: Medicare Other

## 2020-02-22 ENCOUNTER — Telehealth: Payer: Self-pay | Admitting: Physician Assistant

## 2020-02-22 DIAGNOSIS — Y999 Unspecified external cause status: Secondary | ICD-10-CM | POA: Insufficient documentation

## 2020-02-22 DIAGNOSIS — Y9389 Activity, other specified: Secondary | ICD-10-CM | POA: Insufficient documentation

## 2020-02-22 DIAGNOSIS — S2242XA Multiple fractures of ribs, left side, initial encounter for closed fracture: Secondary | ICD-10-CM | POA: Diagnosis not present

## 2020-02-22 DIAGNOSIS — W01198A Fall on same level from slipping, tripping and stumbling with subsequent striking against other object, initial encounter: Secondary | ICD-10-CM | POA: Diagnosis not present

## 2020-02-22 DIAGNOSIS — Z79899 Other long term (current) drug therapy: Secondary | ICD-10-CM | POA: Insufficient documentation

## 2020-02-22 DIAGNOSIS — Y92002 Bathroom of unspecified non-institutional (private) residence single-family (private) house as the place of occurrence of the external cause: Secondary | ICD-10-CM | POA: Diagnosis not present

## 2020-02-22 DIAGNOSIS — Y92009 Unspecified place in unspecified non-institutional (private) residence as the place of occurrence of the external cause: Secondary | ICD-10-CM

## 2020-02-22 DIAGNOSIS — W19XXXA Unspecified fall, initial encounter: Secondary | ICD-10-CM

## 2020-02-22 DIAGNOSIS — I10 Essential (primary) hypertension: Secondary | ICD-10-CM | POA: Insufficient documentation

## 2020-02-22 DIAGNOSIS — F17200 Nicotine dependence, unspecified, uncomplicated: Secondary | ICD-10-CM | POA: Diagnosis not present

## 2020-02-22 DIAGNOSIS — S299XXA Unspecified injury of thorax, initial encounter: Secondary | ICD-10-CM | POA: Diagnosis present

## 2020-02-22 MED ORDER — OXYCODONE-ACETAMINOPHEN 5-325 MG PO TABS
1.0000 | ORAL_TABLET | Freq: Three times a day (TID) | ORAL | 0 refills | Status: AC | PRN
Start: 2020-02-22 — End: 2020-02-27

## 2020-02-22 MED ORDER — OXYCODONE-ACETAMINOPHEN 5-325 MG PO TABS
1.0000 | ORAL_TABLET | Freq: Once | ORAL | Status: AC
  Administered 2020-02-22: 1 via ORAL
  Filled 2020-02-22: qty 1

## 2020-02-22 MED ORDER — LIDOCAINE 5 % EX PTCH
1.0000 | MEDICATED_PATCH | CUTANEOUS | Status: DC
  Administered 2020-02-22: 1 via TRANSDERMAL
  Filled 2020-02-22: qty 1

## 2020-02-22 MED ORDER — LIDOCAINE 5 % EX PTCH: 1.0000 | MEDICATED_PATCH | Freq: Two times a day (BID) | CUTANEOUS | 0 refills | Status: AC | PRN

## 2020-02-22 NOTE — ED Notes (Addendum)
See triage note. Pt presents to the ED for L sided rib pain. Pt states that see fell out of bed.

## 2020-02-22 NOTE — Discharge Instructions (Addendum)
You are being treated for stable rib fractures on the left (4th,5th,6th). Take the prescription meds as directed. Apply ice pack or moist heat to reduce symptoms. Follow-up with your provider for continued symptoms. You may use OTC Salonpas patches in lieu of the Lidoderm patches.

## 2020-02-22 NOTE — ED Provider Notes (Signed)
[facility] Emergency Department Provider Note ____________________________________________  Time seen: 1217  I have reviewed the triage vital signs and the nursing notes.  HISTORY  Chief Complaint  Fall  HPI Jody Lowe is a 74 y.o. female presents her self to the ED for evaluation of left-sided rib pain after 2 separate mechanical falls last week.  Patient denies any head injury or loss of conscious patient also denies any preceding nausea, vomiting, dizziness.  She describes the second fall occurring in the bathroom, when she tipped forward whilst standing up, and landed, hitting her left chest wall on the tub surround.  She recalls hearing a "crack"  at that time.  Past Medical History:  Diagnosis Date   Anemia    Hypertension    Peripheral artery disease (HCC)    Renal disorder     Patient Active Problem List   Diagnosis Date Noted   TIA (transient ischemic attack) 05/01/2019   Tobacco use disorder 05/09/2015   Iron deficiency anemia due to chronic blood loss 03/15/2009   Peripheral vascular disease (HCC) 07/10/2005   Hypertension, benign 03/29/2003   Depressive disorder 10/06/2001    Past Surgical History:  Procedure Laterality Date   BREAST REDUCTION SURGERY     CATARACT EXTRACTION W/PHACO Left 11/11/2019   Procedure: CATARACT EXTRACTION PHACO AND INTRAOCULAR LENS PLACEMENT (IOC) LEFT VISION BLUE;  Surgeon: Elliot Cousin, MD;  Location: [facility];  Service: Ophthalmology;  Laterality: Left;  7.94 0:42.4   CATARACT EXTRACTION W/PHACO Right 12/23/2019   Procedure: CATARACT EXTRACTION PHACO AND INTRAOCULAR LENS PLACEMENT (IOC) RIGHT VISION BLUE 5.47  00:31.1;  Surgeon: Elliot Cousin, MD;  Location: [facility];  Service: Ophthalmology;  Laterality: Right;    Prior to Admission medications   Medication Sig Start Date End Date Taking? Authorizing Provider  albuterol (VENTOLIN HFA) 108 (90 Base) MCG/ACT inhaler Inhale  1-2 puffs into the lungs every 6 (six) hours as needed for wheezing or shortness of breath.    [provider]  alendronate (FOSAMAX) 70 MG tablet Take 70 mg by mouth once a week. 04/20/19   [provider]  amitriptyline (ELAVIL) 25 MG tablet Take 25 mg by mouth at bedtime.    [provider]  aspirin EC 81 MG tablet Take 81 mg by mouth daily.    [provider]  Cholecalciferol (D3-1000) 25 MCG (1000 UT) capsule Take 1,000 Units by mouth daily.    [provider]  diclofenac Sodium (VOLTAREN) 1 % GEL Apply 2 g topically 4 (four) times daily.    [provider]  esomeprazole (NEXIUM) 40 MG capsule Take 40 mg by mouth daily.    [provider]  ferrous sulfate 325 (65 FE) MG tablet Take 325 mg by mouth every other day.    [provider]  FLUoxetine (PROZAC) 40 MG capsule Take 40-80 mg by mouth daily. Take one capsule (40 mg) by mouth once daily for one month, then increase to two capsules (80 mg) thereafter    [provider]  fluticasone (FLONASE) 50 MCG/ACT nasal spray Place 1 spray into both nostrils daily.    [provider]  ipratropium (ATROVENT) 0.03 % nasal spray Place 2 sprays into both nostrils 3 (three) times daily.    [provider]  lovastatin (MEVACOR) 40 MG tablet Take 40 mg by mouth daily with supper.    [provider]  meclizine (ANTIVERT) 25 MG tablet Take 1 tablet (25 mg total) by mouth 3 (  three) times daily as needed for dizziness or nausea. 09/20/19   Emily Filbert, MD  mirabegron ER (MYRBETRIQ) 25 MG TB24 tablet Take 25 mg by mouth daily.    [provider]  naloxone [facility]) nasal spray 4 mg/0.1 mL Place 1 spray into the nose once. Patient not taking: Reported on 12/23/2019    [provider]  nystatin (NYSTATIN) powder Apply 1 application topically 2 (two) times daily.    [provider]  oxybutynin (DITROPAN-XL) 10 MG 24 hr tablet Take  10 mg by mouth 2 (two) times daily.     [provider]  Oxycodone HCl 10 MG TABS Take 10 mg by mouth every 6 (six) hours as needed (pain).    [provider]  polyethylene glycol (MIRALAX / GLYCOLAX) 17 g packet Take 17 g by mouth daily as needed for mild constipation.    [provider]  propranolol (INDERAL) 20 MG tablet Take 20 mg by mouth 3 (three) times daily.    [provider]  senna-docusate (SENOKOT-S) 8.6-50 MG tablet Take 2 tablets by mouth daily.    [provider]  varenicline (CHANTIX) 0.5 MG tablet Take 0.5 mg by mouth 2 (two) times daily. Patient not taking: Reported on 12/23/2019    [provider]  Vitamin D, Ergocalciferol, (DRISDOL) 1.25 MG (50000 UNIT) CAPS capsule Take 50,000 Units by mouth once a week. For 8 doses 04/20/19   [provider]    Allergies Gabapentin and Plavix [clopidogrel bisulfate]  History reviewed. No pertinent family history.  Social History Social History   Tobacco Use   Smoking status: Current Every Day Smoker    Packs/day: 0.50   Smokeless tobacco: Never Used  Substance Use Topics   Alcohol use: No   Drug use: No    Review of Systems  Constitutional: Negative for fever. Eyes: Negative for visual changes. ENT: Negative for sore throat. Cardiovascular: Negative for chest pain. Respiratory: Negative for shortness of breath. Gastrointestinal: Negative for abdominal pain, vomiting and diarrhea. Genitourinary: Negative for dysuria. Musculoskeletal: Negative for back pain.  Left chest wall pain as above. Skin: Negative for rash. Neurological: Negative for headaches, focal weakness or numbness. ____________________________________________  PHYSICAL EXAM:  VITAL SIGNS: ED Triage Vitals  Enc Vitals Group     BP 02/22/20 1120 (!) 153/58     Pulse Rate 02/22/20 1120 77     Resp 02/22/20 1120 18     Temp 02/22/20 1120 98.4 F (36.9 C)     Temp Source 02/22/20 1120 Oral      SpO2 02/22/20 1120 99 %     Weight 02/22/20 1119 190 lb (86.2 kg)     Height 02/22/20 1119 5\' 6"  (1.676 m)     Head Circumference --      Peak Flow --      Pain Score 02/22/20 1119 8     Pain Loc --      Pain Edu? --      Excl. in GC? --     Constitutional: Alert and oriented. Well appearing and in no distress. Head: Normocephalic and atraumatic. Eyes: Conjunctivae are normal. PERRL. Normal extraocular movements Ears: Canals clear. TMs intact bilaterally. Nose: No congestion/rhinorrhea/epistaxis. Mouth/Throat: Mucous membranes are moist. Neck: Supple. No thyromegaly. Hematological/Lymphatic/Immunological: No cervical lymphadenopathy. Cardiovascular: Normal rate, regular rhythm. Normal distal pulses. Respiratory: Normal respiratory effort. No wheezes/rales/rhonchi. Gastrointestinal: Soft and nontender. No distention. Musculoskeletal: Nontender with normal range of motion in all extremities.  Neurologic:  Normal gait  without ataxia. Normal speech and language. No gross focal neurologic deficits are appreciated. Skin:  Skin is warm, dry and intact. No rash noted. Psychiatric: Mood and affect are normal. Patient exhibits appropriate insight and judgment. ____________________________________________   RADIOLOGY  Left Rib w/ CXR  IMPRESSION: 1. Low lung volumes with bibasilar atelectasis and tiny bilateral pleural effusions. No pneumothorax. Stable elevation left hemidiaphragm. 2. Left fourth, fifth, sixth nondisplaced rib fractures. ____________________________________________  PROCEDURES  Percocet 5-325 mg PO Lidoderm 5% patch  Procedures ____________________________________________  INITIAL IMPRESSION / ASSESSMENT AND PLAN / ED COURSE  Patient with ED evaluation of injury sustained following mechanical fall.  Patient presents with left-sided chest wall pain and was found to x-ray to have nondisplaced fractures of the fourth through sixth ribs.  No other  intrathoracic processes or pneumothorax is appreciated.  Patient stable on her presentation otherwise.  She is treated with pain medicines in the ED including a Lidoderm patch and Percocet orally.  She is discharged with prescriptions for Lidoderm patches and a small prescription for Percocet.  She will follow with the primary provider for ongoing symptoms.  She may return to the ED if needed.  OCEANE FOSSE was evaluated in Emergency Department on 02/22/2020 for the symptoms described in the history of present illness. She was evaluated in the context of the global COVID-19 pandemic, which necessitated consideration that the patient might be at risk for infection with the SARS-CoV-2 virus that causes COVID-19. Institutional protocols and algorithms that pertain to the evaluation of patients at risk for COVID-19 are in a state of rapid change based on information released by regulatory bodies including the CDC and federal and state organizations. These policies and algorithms were followed during the patient's care in the ED.  I reviewed the patient's prescription history over the last 12 months in the multi-state controlled substances database(s) that includes [place], [place], [place], [place], [place], [place], [place], [place], [place], [place], [place], [place], [place], and [place].  Results were notable for monthly RX noted.  ____________________________________________  FINAL CLINICAL IMPRESSION(S) / ED DIAGNOSES  Final diagnoses:  Fall in home, initial encounter  Multiple fractures of ribs, left side, initial encounter for closed fracture      Karmen Stabs, Charlesetta Ivory, PA-C 02/22/20 1915    Gilles Chiquito, MD 02/23/20 1045

## 2020-02-22 NOTE — ED Triage Notes (Signed)
Pt states mechanical fall x 2 on 11/25. Pt c/o L lateral rib cage pain at this time. Pt states pain worse with movement at this time.

## 2020-02-22 NOTE — Telephone Encounter (Cosign Needed)
Patient discharged from the ED, and I failed to submit RXs prior to d/c.

## 2020-02-29 DIAGNOSIS — N1831 Chronic kidney disease, stage 3a: Secondary | ICD-10-CM | POA: Insufficient documentation

## 2020-09-04 DIAGNOSIS — I739 Peripheral vascular disease, unspecified: Secondary | ICD-10-CM | POA: Insufficient documentation

## 2020-09-04 DIAGNOSIS — R296 Repeated falls: Secondary | ICD-10-CM | POA: Insufficient documentation

## 2020-10-14 ENCOUNTER — Inpatient Hospital Stay: Payer: Medicare Other

## 2020-10-14 ENCOUNTER — Emergency Department: Payer: Medicare Other

## 2020-10-14 ENCOUNTER — Encounter: Payer: Self-pay | Admitting: Internal Medicine

## 2020-10-14 ENCOUNTER — Inpatient Hospital Stay
Admission: EM | Admit: 2020-10-14 | Discharge: 2020-10-17 | DRG: 253 | Disposition: A | Discharge status: Home Health | Payer: Medicare Other | Attending: Internal Medicine | Admitting: Internal Medicine

## 2020-10-14 DIAGNOSIS — M170 Bilateral primary osteoarthritis of knee: Secondary | ICD-10-CM | POA: Diagnosis present

## 2020-10-14 DIAGNOSIS — D649 Anemia, unspecified: Secondary | ICD-10-CM | POA: Insufficient documentation

## 2020-10-14 DIAGNOSIS — R778 Other specified abnormalities of plasma proteins: Secondary | ICD-10-CM

## 2020-10-14 DIAGNOSIS — I959 Hypotension, unspecified: Secondary | ICD-10-CM | POA: Diagnosis present

## 2020-10-14 DIAGNOSIS — K209 Esophagitis, unspecified without bleeding: Secondary | ICD-10-CM | POA: Diagnosis present

## 2020-10-14 DIAGNOSIS — N179 Acute kidney failure, unspecified: Secondary | ICD-10-CM | POA: Diagnosis present

## 2020-10-14 DIAGNOSIS — M79605 Pain in left leg: Secondary | ICD-10-CM | POA: Diagnosis not present

## 2020-10-14 DIAGNOSIS — Z20822 Contact with and (suspected) exposure to covid-19: Secondary | ICD-10-CM | POA: Diagnosis present

## 2020-10-14 DIAGNOSIS — I70223 Atherosclerosis of native arteries of extremities with rest pain, bilateral legs: Principal | ICD-10-CM | POA: Diagnosis present

## 2020-10-14 DIAGNOSIS — G8929 Other chronic pain: Secondary | ICD-10-CM | POA: Diagnosis not present

## 2020-10-14 DIAGNOSIS — D500 Iron deficiency anemia secondary to blood loss (chronic): Secondary | ICD-10-CM | POA: Diagnosis not present

## 2020-10-14 DIAGNOSIS — R509 Fever, unspecified: Secondary | ICD-10-CM

## 2020-10-14 DIAGNOSIS — I739 Peripheral vascular disease, unspecified: Secondary | ICD-10-CM | POA: Diagnosis not present

## 2020-10-14 DIAGNOSIS — E538 Deficiency of other specified B group vitamins: Secondary | ICD-10-CM | POA: Diagnosis present

## 2020-10-14 DIAGNOSIS — F17210 Nicotine dependence, cigarettes, uncomplicated: Secondary | ICD-10-CM | POA: Diagnosis present

## 2020-10-14 DIAGNOSIS — T6709XA Other heatstroke and sunstroke, initial encounter: Secondary | ICD-10-CM | POA: Diagnosis present

## 2020-10-14 DIAGNOSIS — D519 Vitamin B12 deficiency anemia, unspecified: Secondary | ICD-10-CM | POA: Diagnosis not present

## 2020-10-14 DIAGNOSIS — I248 Other forms of acute ischemic heart disease: Secondary | ICD-10-CM | POA: Diagnosis present

## 2020-10-14 DIAGNOSIS — Z79899 Other long term (current) drug therapy: Secondary | ICD-10-CM

## 2020-10-14 DIAGNOSIS — M25562 Pain in left knee: Secondary | ICD-10-CM | POA: Diagnosis not present

## 2020-10-14 DIAGNOSIS — R710 Precipitous drop in hematocrit: Secondary | ICD-10-CM | POA: Diagnosis not present

## 2020-10-14 DIAGNOSIS — M810 Age-related osteoporosis without current pathological fracture: Secondary | ICD-10-CM | POA: Diagnosis not present

## 2020-10-14 DIAGNOSIS — M25561 Pain in right knee: Secondary | ICD-10-CM | POA: Diagnosis not present

## 2020-10-14 DIAGNOSIS — F17200 Nicotine dependence, unspecified, uncomplicated: Secondary | ICD-10-CM | POA: Diagnosis not present

## 2020-10-14 DIAGNOSIS — M25569 Pain in unspecified knee: Secondary | ICD-10-CM

## 2020-10-14 DIAGNOSIS — M79604 Pain in right leg: Secondary | ICD-10-CM | POA: Diagnosis not present

## 2020-10-14 DIAGNOSIS — R55 Syncope and collapse: Secondary | ICD-10-CM | POA: Diagnosis present

## 2020-10-14 DIAGNOSIS — I10 Essential (primary) hypertension: Secondary | ICD-10-CM | POA: Diagnosis present

## 2020-10-14 DIAGNOSIS — I70322 Atherosclerosis of unspecified type of bypass graft(s) of the extremities with rest pain, left leg: Secondary | ICD-10-CM | POA: Diagnosis not present

## 2020-10-14 DIAGNOSIS — R531 Weakness: Secondary | ICD-10-CM | POA: Diagnosis not present

## 2020-10-14 DIAGNOSIS — M79606 Pain in leg, unspecified: Secondary | ICD-10-CM

## 2020-10-14 LAB — CBC WITH DIFFERENTIAL/PLATELET
Abs Immature Granulocytes: 0.05 10*3/uL (ref 0.00–0.07)
Basophils Absolute: 0.1 10*3/uL (ref 0.0–0.1)
Basophils Relative: 1 %
Eosinophils Absolute: 0.4 10*3/uL (ref 0.0–0.5)
Eosinophils Relative: 6 %
HCT: 29.4 % — ABNORMAL LOW (ref 36.0–46.0)
Hemoglobin: 9.8 g/dL — ABNORMAL LOW (ref 12.0–15.0)
Immature Granulocytes: 1 %
Lymphocytes Relative: 25 %
Lymphs Abs: 1.6 10*3/uL (ref 0.7–4.0)
MCH: 27.1 pg (ref 26.0–34.0)
MCHC: 33.3 g/dL (ref 30.0–36.0)
MCV: 81.4 fL (ref 80.0–100.0)
Monocytes Absolute: 0.7 10*3/uL (ref 0.1–1.0)
Monocytes Relative: 11 %
Neutro Abs: 3.4 10*3/uL (ref 1.7–7.7)
Neutrophils Relative %: 56 %
Platelets: 488 10*3/uL — ABNORMAL HIGH (ref 150–400)
RBC: 3.61 MIL/uL — ABNORMAL LOW (ref 3.87–5.11)
RDW: 15.8 % — ABNORMAL HIGH (ref 11.5–15.5)
WBC: 6.2 10*3/uL (ref 4.0–10.5)
nRBC: 0.3 % — ABNORMAL HIGH (ref 0.0–0.2)

## 2020-10-14 LAB — BASIC METABOLIC PANEL
Anion gap: 6 (ref 5–15)
BUN: 16 mg/dL (ref 8–23)
CO2: 22 mmol/L (ref 22–32)
Calcium: 9 mg/dL (ref 8.9–10.3)
Chloride: 106 mmol/L (ref 98–111)
Creatinine, Ser: 1.45 mg/dL — ABNORMAL HIGH (ref 0.44–1.00)
GFR, Estimated: 37 mL/min — ABNORMAL LOW (ref >60)
Glucose, Bld: 108 mg/dL — ABNORMAL HIGH (ref 70–99)
Potassium: 3.8 mmol/L (ref 3.5–5.1)
Sodium: 134 mmol/L — ABNORMAL LOW (ref 135–145)

## 2020-10-14 LAB — URINALYSIS, COMPLETE (UACMP) WITH MICROSCOPIC
Bacteria, UA: NONE SEEN
Bilirubin Urine: NEGATIVE
Glucose, UA: NEGATIVE mg/dL
Hgb urine dipstick: NEGATIVE
Ketones, ur: NEGATIVE mg/dL
Leukocytes,Ua: NEGATIVE
Nitrite: NEGATIVE
Protein, ur: NEGATIVE mg/dL
Specific Gravity, Urine: 1.009 (ref 1.005–1.030)
pH: 6 (ref 5.0–8.0)

## 2020-10-14 LAB — RESP PANEL BY RT-PCR (FLU A&B, COVID) ARPGX2
Influenza A by PCR: NEGATIVE
Influenza B by PCR: NEGATIVE
SARS Coronavirus 2 by RT PCR: NEGATIVE

## 2020-10-14 LAB — RETICULOCYTES
Immature Retic Fract: 20.8 % — ABNORMAL HIGH (ref 2.3–15.9)
RBC.: 3.58 MIL/uL — ABNORMAL LOW (ref 3.87–5.11)
Retic Count, Absolute: 43 10*3/uL (ref 19.0–186.0)
Retic Ct Pct: 1.2 % (ref 0.4–3.1)

## 2020-10-14 LAB — PROTIME-INR
INR: 1.2 (ref 0.8–1.2)
Prothrombin Time: 14.8 seconds (ref 11.4–15.2)

## 2020-10-14 LAB — IRON AND TIBC
Iron: 37 ug/dL (ref 28–170)
Saturation Ratios: 10 % — ABNORMAL LOW (ref 10.4–31.8)
TIBC: 381 ug/dL (ref 250–450)
UIBC: 344 ug/dL

## 2020-10-14 LAB — FOLATE: Folate: 5.4 ng/mL — ABNORMAL LOW (ref >5.9)

## 2020-10-14 LAB — APTT: aPTT: 33 seconds (ref 24–36)

## 2020-10-14 LAB — LACTIC ACID, PLASMA
Lactic Acid, Venous: 2.3 mmol/L (ref 0.5–1.9)
Lactic Acid, Venous: 1.1 mmol/L (ref 0.5–1.9)

## 2020-10-14 LAB — FERRITIN: Ferritin: 10 ng/mL — ABNORMAL LOW (ref 11–307)

## 2020-10-14 LAB — T4, FREE: Free T4: 1.37 ng/dL — ABNORMAL HIGH (ref 0.61–1.12)

## 2020-10-14 LAB — TROPONIN I (HIGH SENSITIVITY)
Troponin I (High Sensitivity): 67 ng/L — ABNORMAL HIGH (ref <18)
Troponin I (High Sensitivity): 319 ng/L (ref <18)

## 2020-10-14 LAB — TSH: TSH: 0.777 u[IU]/mL (ref 0.350–4.500)

## 2020-10-14 LAB — CK: Total CK: 65 U/L (ref 38–234)

## 2020-10-14 MED ORDER — ACETAMINOPHEN 650 MG RE SUPP: 650.0000 mg | Freq: Four times a day (QID) | RECTAL | Status: DC | PRN

## 2020-10-14 MED ORDER — ASPIRIN EC 81 MG PO TBEC
81.0000 mg | DELAYED_RELEASE_TABLET | Freq: Every day | ORAL | Status: DC
  Administered 2020-10-15 – 2020-10-17 (×3): 81 mg via ORAL
  Filled 2020-10-14 (×3): qty 1

## 2020-10-14 MED ORDER — HEPARIN (PORCINE) 25000 UT/250ML-% IV SOLN
1000.0000 [IU]/h | INTRAVENOUS | Status: DC
  Filled 2020-10-14: qty 250

## 2020-10-14 MED ORDER — NICOTINE 21 MG/24HR TD PT24
21.0000 mg | MEDICATED_PATCH | Freq: Every day | TRANSDERMAL | Status: DC
  Administered 2020-10-15: 21 mg via TRANSDERMAL
  Filled 2020-10-14 (×2): qty 1

## 2020-10-14 MED ORDER — IPRATROPIUM BROMIDE 0.03 % NA SOLN
2.0000 | Freq: Three times a day (TID) | NASAL | Status: DC
  Administered 2020-10-15 – 2020-10-16 (×5): 2 via NASAL
  Filled 2020-10-14: qty 30

## 2020-10-14 MED ORDER — HEPARIN BOLUS VIA INFUSION
4000.0000 [IU] | Freq: Once | INTRAVENOUS | Status: DC
  Filled 2020-10-14: qty 4000

## 2020-10-14 MED ORDER — ALBUTEROL SULFATE (2.5 MG/3ML) 0.083% IN NEBU: 3.0000 mL | INHALATION_SOLUTION | Freq: Four times a day (QID) | RESPIRATORY_TRACT | Status: DC | PRN

## 2020-10-14 MED ORDER — ACETAMINOPHEN 325 MG PO TABS
650.0000 mg | ORAL_TABLET | Freq: Four times a day (QID) | ORAL | Status: DC | PRN
  Administered 2020-10-15: 650 mg via ORAL
  Filled 2020-10-14: qty 2

## 2020-10-14 MED ORDER — ASPIRIN 81 MG PO CHEW
324.0000 mg | CHEWABLE_TABLET | Freq: Once | ORAL | Status: AC
  Administered 2020-10-14: 324 mg via ORAL
  Filled 2020-10-14: qty 4

## 2020-10-14 MED ORDER — SODIUM CHLORIDE 0.9 % IV SOLN: Freq: Once | INTRAVENOUS | Status: DC

## 2020-10-14 MED ORDER — FLUOXETINE HCL 20 MG PO CAPS
80.0000 mg | ORAL_CAPSULE | Freq: Every day | ORAL | Status: DC
  Administered 2020-10-15 – 2020-10-17 (×3): 80 mg via ORAL
  Filled 2020-10-14 (×4): qty 4

## 2020-10-14 MED ORDER — FERROUS SULFATE 325 (65 FE) MG PO TABS
325.0000 mg | ORAL_TABLET | ORAL | Status: DC
  Administered 2020-10-15 – 2020-10-16 (×3): 325 mg via ORAL
  Filled 2020-10-14 (×3): qty 1

## 2020-10-14 MED ORDER — MIRABEGRON ER 25 MG PO TB24
25.0000 mg | ORAL_TABLET | Freq: Every day | ORAL | Status: DC
  Administered 2020-10-15 – 2020-10-16 (×3): 25 mg via ORAL
  Filled 2020-10-14 (×5): qty 1

## 2020-10-14 MED ORDER — PROPRANOLOL HCL 20 MG PO TABS: 20.0000 mg | ORAL_TABLET | Freq: Three times a day (TID) | ORAL | Status: DC

## 2020-10-14 MED ORDER — AMITRIPTYLINE HCL 25 MG PO TABS
25.0000 mg | ORAL_TABLET | Freq: Every day | ORAL | Status: DC
  Administered 2020-10-15 – 2020-10-16 (×3): 25 mg via ORAL
  Filled 2020-10-14 (×3): qty 1

## 2020-10-14 MED ORDER — SODIUM CHLORIDE 0.9 % IV SOLN: INTRAVENOUS | Status: DC

## 2020-10-14 MED ORDER — FLUTICASONE PROPIONATE 50 MCG/ACT NA SUSP
1.0000 | Freq: Every day | NASAL | Status: DC
  Administered 2020-10-15 – 2020-10-17 (×3): 1 via NASAL
  Filled 2020-10-14: qty 16

## 2020-10-14 MED ORDER — OXYBUTYNIN CHLORIDE ER 10 MG PO TB24
10.0000 mg | ORAL_TABLET | Freq: Two times a day (BID) | ORAL | Status: DC
  Administered 2020-10-15 (×2): 10 mg via ORAL
  Filled 2020-10-14 (×3): qty 1

## 2020-10-14 MED ORDER — PANTOPRAZOLE SODIUM 40 MG PO TBEC
40.0000 mg | DELAYED_RELEASE_TABLET | Freq: Every day | ORAL | Status: DC
  Administered 2020-10-15 – 2020-10-17 (×3): 40 mg via ORAL
  Filled 2020-10-14 (×3): qty 1

## 2020-10-14 NOTE — ED Notes (Signed)
Phlebotomy notified to draw admission labs.

## 2020-10-14 NOTE — ED Notes (Signed)
Patient transported to MRI 

## 2020-10-14 NOTE — ED Provider Notes (Signed)
[facility] Emergency Department Provider Note   ____________________________________________   I have reviewed the triage vital signs and the nursing notes.   HISTORY  Chief Complaint Heat Exposure and Hyperthermia   History limited by: Amnesia   HPI Jody Lowe is a 75 y.o. female who presents to the emergency department today via EMS after being found down outside. The patient herself is not sure what happened or what brought her to the emergency department. She does not remember falling. Per report the patient was apparently found down outside of her house on the concrete. Unknown how long the patient may have been outside. The patient is denying any pain. Denying any recent illness.   Records reviewed. Per medical record review patient has a history of HTN, TIA.   Past Medical History:  Diagnosis Date   Anemia    Hypertension    Peripheral artery disease (HCC)    Renal disorder     Patient Active Problem List   Diagnosis Date Noted   TIA (transient ischemic attack) 05/01/2019   Tobacco use disorder 05/09/2015   Iron deficiency anemia due to chronic blood loss 03/15/2009   Peripheral vascular disease (HCC) 07/10/2005   Hypertension, benign 03/29/2003   Depressive disorder 10/06/2001    Past Surgical History:  Procedure Laterality Date   BREAST REDUCTION SURGERY     CATARACT EXTRACTION W/PHACO Left 11/11/2019   Procedure: CATARACT EXTRACTION PHACO AND INTRAOCULAR LENS PLACEMENT (IOC) LEFT VISION BLUE;  Surgeon: Elliot Cousin, MD;  Location: [facility];  Service: Ophthalmology;  Laterality: Left;  7.94 0:42.4   CATARACT EXTRACTION W/PHACO Right 12/23/2019   Procedure: CATARACT EXTRACTION PHACO AND INTRAOCULAR LENS PLACEMENT (IOC) RIGHT VISION BLUE 5.47  00:31.1;  Surgeon: Elliot Cousin, MD;  Location: [facility];  Service: Ophthalmology;  Laterality: Right;    Prior to Admission medications   Medication Sig Start Date End  Date Taking? Authorizing Provider  albuterol (VENTOLIN HFA) 108 (90 Base) MCG/ACT inhaler Inhale 1-2 puffs into the lungs every 6 (six) hours as needed for wheezing or shortness of breath.    [provider]  alendronate (FOSAMAX) 70 MG tablet Take 70 mg by mouth once a week. 04/20/19   [provider]  amitriptyline (ELAVIL) 25 MG tablet Take 25 mg by mouth at bedtime.    [provider]  aspirin EC 81 MG tablet Take 81 mg by mouth daily.    [provider]  Cholecalciferol (D3-1000) 25 MCG (1000 UT) capsule Take 1,000 Units by mouth daily.    [provider]  diclofenac Sodium (VOLTAREN) 1 % GEL Apply 2 g topically 4 (four) times daily.    [provider]  esomeprazole (NEXIUM) 40 MG capsule Take 40 mg by mouth daily.    [provider]  ferrous sulfate 325 (65 FE) MG tablet Take 325 mg by mouth every other day.    [provider]  FLUoxetine (PROZAC) 40 MG capsule Take 40-80 mg by mouth daily. Take one capsule (40 mg) by mouth once daily for one month, then increase to two capsules (80 mg) thereafter    [provider]  fluticasone (FLONASE) 50 MCG/ACT nasal spray Place 1 spray into both nostrils daily.    [provider]  ipratropium (ATROVENT) 0.03 % nasal spray Place 2 sprays into both nostrils 3 (three) times daily.    [provider]  lovastatin (MEVACOR) 40 MG tablet Take 40 mg by mouth daily with supper.  [provider]  meclizine (ANTIVERT) 25 MG tablet Take 1 tablet (25 mg total) by mouth 3 (three) times daily as needed for dizziness or nausea. 09/20/19   Emily Filbert, MD  mirabegron ER (MYRBETRIQ) 25 MG TB24 tablet Take 25 mg by mouth daily.    [provider]  naloxone [facility]) nasal spray 4 mg/0.1 mL Place 1 spray into the nose once. Patient not taking: Reported on 12/23/2019    [provider]  nystatin (NYSTATIN) powder Apply 1 application topically 2  (two) times daily.    [provider]  oxybutynin (DITROPAN-XL) 10 MG 24 hr tablet Take 10 mg by mouth 2 (two) times daily.     [provider]  Oxycodone HCl 10 MG TABS Take 10 mg by mouth every 6 (six) hours as needed (pain).    [provider]  polyethylene glycol (MIRALAX / GLYCOLAX) 17 g packet Take 17 g by mouth daily as needed for mild constipation.    [provider]  propranolol (INDERAL) 20 MG tablet Take 20 mg by mouth 3 (three) times daily.    [provider]  senna-docusate (SENOKOT-S) 8.6-50 MG tablet Take 2 tablets by mouth daily.    [provider]  varenicline (CHANTIX) 0.5 MG tablet Take 0.5 mg by mouth 2 (two) times daily. Patient not taking: Reported on 12/23/2019    [provider]  Vitamin D, Ergocalciferol, (DRISDOL) 1.25 MG (50000 UNIT) CAPS capsule Take 50,000 Units by mouth once a week. For 8 doses 04/20/19   [provider]    Allergies Gabapentin and Plavix [clopidogrel bisulfate]  No family history on file.  Social History Social History   Tobacco Use   Smoking status: Every Day    Packs/day: 0.50    Types: Cigarettes   Smokeless tobacco: Never  Substance Use Topics   Alcohol use: No   Drug use: No    Review of Systems Constitutional: No fever/chills Eyes: No visual changes. ENT: No sore throat. Cardiovascular: Denies chest pain. Respiratory: Denies shortness of breath. Gastrointestinal: No abdominal pain.  No nausea, no vomiting.  No diarrhea.   Genitourinary: Negative for dysuria. Musculoskeletal: Negative for back pain. Skin: Negative for rash. Neurological: Amnesia to events.   ____________________________________________   PHYSICAL EXAM:  VITAL SIGNS: ED Triage Vitals  Enc Vitals Group     BP 10/14/20 1803 (!) 143/66     Pulse Rate 10/14/20 1803 (!) 123     Resp 10/14/20 1803 20     Temp 10/14/20 1803 (!) 105.6 F (40.9 C)     Temp Source 10/14/20 1803 Oral      SpO2 10/14/20 1803 99 %     Weight 10/14/20 1804 187 lb 6.3 oz (85 kg)     Height 10/14/20 1804 5\' 5"  (1.651 m)     Head Circumference --      Peak Flow --      Pain Score 10/14/20 1804 0   Constitutional: Awake and alert. Not oriented to events.  Eyes: Conjunctivae are normal.  ENT      Head: Normocephalic and atraumatic.      Nose: No congestion/rhinnorhea.      Mouth/Throat: Mucous membranes are moist.      Neck: No stridor. Hematological/Lymphatic/Immunilogical: No cervical lymphadenopathy. Cardiovascular: Normal rate, regular rhythm.  No murmurs, rubs, or gallops.  Respiratory: Normal respiratory effort without tachypnea nor retractions. Breath sounds are clear and equal bilaterally. No wheezes/rales/rhonchi. Gastrointestinal: Soft and non tender. No rebound.  No guarding.  Genitourinary: Deferred Musculoskeletal: Normal range of motion in all extremities. No lower extremity edema. Neurologic:  Amnesia to earlier events. Normal speech and language. No gross focal neurologic deficits are appreciated.  Skin:  Skin is warm, dry and intact. No rash noted. Psychiatric: Mood and affect are normal. Speech and behavior are normal. Patient exhibits appropriate insight and judgment.  ____________________________________________    LABS (pertinent positives/negatives)  Trop hs 67 CK 65 Lactic acid 2.3 BMP na 134, k 3.8, glu 108, cr 1.45 CBC wbc 6.2, hgb 9.8, plt 488 UA not consistent with infection  ____________________________________________   EKG  I, Phineas Semen, attending physician, personally viewed and interpreted this EKG  EKG Time: 1743 Rate: 126 Rhythm: sinus tachycardia Axis: normal Intervals: qtc 454 QRS: narrow ST changes: no st elevation Impression: abnormal ekg  ____________________________________________    RADIOLOGY  CT head/cervical spine No acute  abnormality  ____________________________________________   PROCEDURES  Procedures  CRITICAL CARE Performed by: Phineas Semen   Total critical care time: 35 minutes  Critical care time was exclusive of separately billable procedures and treating other patients.  Critical care was necessary to treat or prevent imminent or life-threatening deterioration.  Critical care was time spent personally by me on the following activities: development of treatment plan with patient and/or surrogate as well as nursing, discussions with consultants, evaluation of patient's response to treatment, examination of patient, obtaining history from patient or surrogate, ordering and performing treatments and interventions, ordering and review of laboratory studies, ordering and review of radiographic studies, pulse oximetry and re-evaluation of patient's condition.  ____________________________________________   INITIAL IMPRESSION / ASSESSMENT AND PLAN / ED COURSE  Pertinent labs & imaging results that were available during my care of the patient were reviewed by me and considered in my medical decision making (see chart for details).   Patient presents to the emergency department today after being found down outside by a neighbor.  When EMS first arrived patient was quite unresponsive.  She was found to be quite hypothermic and tachycardic here in the emergency department.  Did have immediate concerns for heat exposure.  Arctic sun was placed which did help bring patient's temperature down quickly.  Once the temperature start improving patient's altered mental status did improve as well.  She became quite lucid and back to her mental baseline.  She however remained amnesic to the events.  Unclear why she might have fallen.  Initial troponin was slightly elevated at 67 however repeat was more elevated at 319.  Discussed with Dr. Lennette Bihari with cardiology.  Will start heparin at this time.  Will plan on admission.   Discussed findings and plan with patient.  ____________________________________________   FINAL CLINICAL IMPRESSION(S) / ED DIAGNOSES  Final diagnoses:  Hyperthermia  Elevated troponin     Note: This dictation was prepared with Dragon dictation. Any transcriptional errors that result from this process are unintentional     Phineas Semen, MD 10/14/20 2142

## 2020-10-14 NOTE — H&P (Signed)
History and Physical    Jody Lowe DOB: 11-Jan-1946 DOA: 10/14/2020  PCP: Idelia Salm, MD    Patient coming from:  Home   Chief Complaint:  Syncope and collapse   HPI: Jody Lowe is a 75 y.o. female brought to the emergency room via EMS after bystander called for patient found on the floor for unknown duration today.  Patient states the last thing she remembers is coming home after grocery shopping and getting out of her car.  She does not recollect the time that was.  This past week patient does describe having vertigo and dizziness and palpitations.  On her last admission in February 2021 patient was admitted for left arm numbness for TIA and had a negative MRI then.  Patient today denies any complaints of chest pain palpitation shortness of breath headache blurred vision speech or gait ataxia, no abdominal or bladder issues.  Patient only reports leg pains and claudication-like symptoms due to her PAD that her vascular doctor knows about at [facility] patient also reports that she has a stent in her legs.  Counseled patient on tobacco cessation is ongoing tobacco because a stent closures and limb ischemia and among other complications patient states that she is aware that she smoking can cause state closures and make her blood supply worse.  Chart reviewed and discussed with patient about her anemia patient states that he does recall having ulcer treated by endoscopy by GI at [facility].  Patient does take Fosamax.  Pt has past medical history of chronic anemia, hypertension, tobacco abuse, PAD. ED Course:  Vitals:   10/14/20 1935 10/14/20 2000 10/14/20 2030 10/14/20 2130  BP: (!) 140/94 (!) 105/47 (!) 104/49 (!) 110/52  Pulse: 94 93 89 85  Resp: Temp: 98.4 F (36.9 C) 98.1 F (36.7 C) 98.1 F (36.7 C) 98 F (36.7 C)  TempSrc:      SpO2: 100% 99% 97% 100%  Weight:      Height:      In the emergency room patient is alert awake oriented  temperature has come down to 98.4 from the initial 105 after using a cooling blanket.  Patient continues to have a Foley with thermometer.  Initial blood work shows sodium of 134 glucose of 108, creatinine of 1.45, Lab Results  Component Value Date   CREATININE 1.45 (H) 10/14/2020   CREATININE 1.03 (H) 09/20/2019   CREATININE 1.08 (H) 05/02/2019  Initial troponin of 67, repeat of 319, initial lactic of 2.3, repeat of 1.1, CBC shows a normal white count of 6.2 hemoglobin of 9.8 MCV of 81, RDW of 15.8, platelets of 488.  Respiratory panel negative for influenza and COVID.  CPK of 65.  Blood and urine cultures collected in the ED.  Head CT negative for any acute abnormalities, CT cervical spine negative for any acute abnormalities.   Review of Systems:  Review of Systems  Cardiovascular:  Positive for palpitations.  Neurological:  Positive for dizziness.  All other systems reviewed and are negative.   Past Medical History:  Diagnosis Date   Anemia    Hypertension    Peripheral artery disease (HCC)    Renal disorder     Past Surgical History:  Procedure Laterality Date   BREAST REDUCTION SURGERY     CATARACT EXTRACTION W/PHACO Left 11/11/2019   Procedure: CATARACT EXTRACTION PHACO AND INTRAOCULAR LENS PLACEMENT (IOC) LEFT VISION BLUE;  Surgeon: Elliot Cousin, MD;  Location: [facility];  Service: Ophthalmology;  Laterality: Left;  7.94 0:42.4   CATARACT EXTRACTION W/PHACO Right 12/23/2019   Procedure: CATARACT EXTRACTION PHACO AND INTRAOCULAR LENS PLACEMENT (IOC) RIGHT VISION BLUE 5.47  00:31.1;  Surgeon: Elliot Cousin, MD;  Location: [facility];  Service: Ophthalmology;  Laterality: Right;     reports that she has been smoking. She has been smoking an average of .5 packs per day. She has never used smokeless tobacco. She reports that she does not drink alcohol and does not use drugs.  Allergies  Allergen Reactions   Gabapentin Other (See Comments)    Nervous, shaky    Plavix [Clopidogrel Bisulfate] Rash    History reviewed. No pertinent family history.  Prior to Admission medications   Medication Sig Start Date End Date Taking? Authorizing Provider  albuterol (VENTOLIN HFA) 108 (90 Base) MCG/ACT inhaler Inhale 1-2 puffs into the lungs every 6 (six) hours as needed for wheezing or shortness of breath.    [provider]  alendronate (FOSAMAX) 70 MG tablet Take 70 mg by mouth once a week. 04/20/19   [provider]  amitriptyline (ELAVIL) 25 MG tablet Take 25 mg by mouth at bedtime.    [provider]  aspirin EC 81 MG tablet Take 81 mg by mouth daily.    [provider]  Cholecalciferol (D3-1000) 25 MCG (1000 UT) capsule Take 1,000 Units by mouth daily.    [provider]  diclofenac Sodium (VOLTAREN) 1 % GEL Apply 2 g topically 4 (four) times daily.    [provider]  esomeprazole (NEXIUM) 40 MG capsule Take 40 mg by mouth daily.    [provider]  ferrous sulfate 325 (65 FE) MG tablet Take 325 mg by mouth every other day.    [provider]  FLUoxetine (PROZAC) 40 MG capsule Take 40-80 mg by mouth daily. Take one capsule (40 mg) by mouth once daily for one month, then increase to two capsules (80 mg) thereafter    [provider]  fluticasone (FLONASE) 50 MCG/ACT nasal spray Place 1 spray into both nostrils daily.    [provider]  ipratropium (ATROVENT) 0.03 % nasal spray Place 2 sprays into both nostrils 3 (three) times daily.    [provider]  lovastatin (MEVACOR) 40 MG tablet Take 40 mg by mouth daily with supper.    [provider]  meclizine (ANTIVERT) 25 MG tablet Take 1 tablet (25 mg total) by mouth 3 (three) times daily as needed for dizziness or nausea. 09/20/19   Emily Filbert, MD  mirabegron ER (MYRBETRIQ) 25 MG TB24 tablet Take 25 mg by mouth daily.    [provider]  naloxone [facility]) nasal spray 4 mg/0.1 mL Place  1 spray into the nose once. Patient not taking: Reported on 12/23/2019    [provider]  nystatin (NYSTATIN) powder Apply 1 application topically 2 (two) times daily.    [provider]  oxybutynin (DITROPAN-XL) 10 MG 24 hr tablet Take 10 mg by mouth 2 (two) times daily.     [provider]  Oxycodone HCl 10 MG TABS Take 10 mg by mouth every 6 (six) hours as needed (pain).    [provider]  polyethylene glycol (MIRALAX / GLYCOLAX) 17 g packet Take 17 g by mouth daily as needed for mild constipation.    [provider]  propranolol (INDERAL) 20 MG tablet Take 20 mg by mouth 3 (three) times daily.    [provider]  senna-docusate (SENOKOT-S) 8.6-50 MG tablet Take 2 tablets by mouth daily.    [provider]  varenicline (CHANTIX) 0.5 MG tablet Take 0.5 mg by mouth 2 (two) times daily. Patient not taking: Reported on 12/23/2019    [provider]  Vitamin D, Ergocalciferol, (DRISDOL) 1.25 MG (50000 UNIT) CAPS capsule Take 50,000 Units by mouth once a week. For 8 doses 04/20/19   [provider]    Physical Exam: Vitals:   10/14/20 1935 10/14/20 2000 10/14/20 2030 10/14/20 2130  BP: (!) 140/94 (!) 105/47 (!) 104/49 (!) 110/52  Pulse: 94 93 89 85  Resp: 19 17 15 18   Temp: 98.4 F (36.9 C) 98.1 F (36.7 C) 98.1 F (36.7 C) 98 F (36.7 C)  TempSrc:      SpO2: 100% 99% 97% 100%  Weight:      Height:       Physical Exam Vitals and nursing note reviewed.  Constitutional:      General: She is not in acute distress.    Appearance: Normal appearance. She is not ill-appearing.  HENT:     Head: Normocephalic and atraumatic.     Right Ear: External ear normal.     Left Ear: External ear normal.     Nose: Nose normal.     Mouth/Throat:     Mouth: Mucous membranes are moist.  Eyes:     Extraocular Movements: Extraocular movements intact.     Pupils: Pupils are equal, round, and reactive to light.  Neck:      Vascular: Carotid bruit present.  Cardiovascular:     Rate and Rhythm: Normal rate and regular rhythm.     Pulses:          Dorsalis pedis pulses are 0 on the right side and 0 on the left side.       Posterior tibial pulses are 0 on the right side and 0 on the left side.     Heart sounds: Murmur heard.  Pulmonary:     Effort: Pulmonary effort is normal.     Breath sounds: Normal breath sounds.  Abdominal:     General: Bowel sounds are normal. There is no distension.     Palpations: Abdomen is soft. There is no mass.     Tenderness: There is no abdominal tenderness. There is no guarding.     Hernia: No hernia is present.  Skin:    General: Skin is warm.     Findings: No lesion.  Neurological:     General: No focal deficit present.     Mental Status: She is alert and oriented to person, place, and time.  Psychiatric:        Mood and Affect: Mood normal.        Behavior: Behavior normal.   Labs on Admission: I have personally reviewed following labs and imaging studies  Recent Labs    10/14/20 1745  CKTOTAL 65   Lab Results  Component Value Date   WBC 6.2 10/14/2020   HGB 9.8 (L) 10/14/2020   HCT 29.4 (L) 10/14/2020   MCV 81.4 10/14/2020   PLT 488 (H) 10/14/2020    Recent Labs  Lab 10/14/20 1745  NA 134*  K 3.8  CL 106  CO2 22  BUN 16  CREATININE 1.45*  CALCIUM 9.0  GLUCOSE 108*   Lab Results  Component Value Date   CHOL 109 05/02/2019   HDL 34 (L) 05/02/2019   LDLCALC 59 05/02/2019   TRIG 81  05/02/2019   No results found for: DDIMER Invalid input(s): POCBNP  Urinalysis    Component Value Date/Time   COLORURINE YELLOW (A) 10/14/2020 1745   APPEARANCEUR CLEAR (A) 10/14/2020 1745   LABSPEC 1.009 10/14/2020 1745   PHURINE 6.0 10/14/2020 1745   GLUCOSEU NEGATIVE 10/14/2020 1745   HGBUR NEGATIVE 10/14/2020 1745   BILIRUBINUR NEGATIVE 10/14/2020 1745   KETONESUR NEGATIVE 10/14/2020 1745   PROTEINUR NEGATIVE 10/14/2020 1745   NITRITE NEGATIVE 10/14/2020  1745   LEUKOCYTESUR NEGATIVE 10/14/2020 1745   COVID-19 Labs Lab Results  Component Value Date   SARSCOV2NAA NEGATIVE 10/14/2020   SARSCOV2NAA NEGATIVE 12/21/2019   SARSCOV2NAA NEGATIVE 11/09/2019   SARSCOV2NAA NEGATIVE 05/01/2019    Radiological Exams on Admission: CT Head Wo Contrast  Result Date: 10/14/2020 CLINICAL DATA:  Fall with head and neck pain. EXAM: CT HEAD WITHOUT CONTRAST CT CERVICAL SPINE WITHOUT CONTRAST TECHNIQUE: Multidetector CT imaging of the head and cervical spine was performed following the standard protocol without intravenous contrast. Multiplanar CT image reconstructions of the cervical spine were also generated. COMPARISON:  CT head dated 09/20/2019. FINDINGS: CT HEAD FINDINGS Brain: No evidence of acute infarction, hemorrhage, hydrocephalus, extra-axial collection or mass lesion/mass effect. Periventricular white matter hypoattenuation likely represents chronic small vessel ischemic disease. Vascular: There are vascular calcifications in the carotid siphons. Skull: Normal. Negative for fracture or focal lesion. Sinuses/Orbits: No acute finding. Other: There is soft tissue swelling overlying the right forehead and frontal bone. CT CERVICAL SPINE FINDINGS Alignment: Normal. Skull base and vertebrae: No acute fracture. No primary bone lesion or focal pathologic process. Soft tissues and spinal canal: No prevertebral fluid or swelling. No visible canal hematoma. Disc levels: Up to moderate to severe multilevel degenerative disc and joint disease. Upper chest: Negative. Other: None. IMPRESSION: 1. No acute intracranial process. 2. No acute osseous injury in the cervical spine. Electronically Signed   By: Romona Curls M.D.   On: 10/14/2020 19:24   CT Cervical Spine Wo Contrast  Result Date: 10/14/2020 CLINICAL DATA:  Fall with head and neck pain. EXAM: CT HEAD WITHOUT CONTRAST CT CERVICAL SPINE WITHOUT CONTRAST TECHNIQUE: Multidetector CT imaging of the head and cervical  spine was performed following the standard protocol without intravenous contrast. Multiplanar CT image reconstructions of the cervical spine were also generated. COMPARISON:  CT head dated 09/20/2019. FINDINGS: CT HEAD FINDINGS Brain: No evidence of acute infarction, hemorrhage, hydrocephalus, extra-axial collection or mass lesion/mass effect. Periventricular white matter hypoattenuation likely represents chronic small vessel ischemic disease. Vascular: There are vascular calcifications in the carotid siphons. Skull: Normal. Negative for fracture or focal lesion. Sinuses/Orbits: No acute finding. Other: There is soft tissue swelling overlying the right forehead and frontal bone. CT CERVICAL SPINE FINDINGS Alignment: Normal. Skull base and vertebrae: No acute fracture. No primary bone lesion or focal pathologic process. Soft tissues and spinal canal: No prevertebral fluid or swelling. No visible canal hematoma. Disc levels: Up to moderate to severe multilevel degenerative disc and joint disease. Upper chest: Negative. Other: None. IMPRESSION: 1. No acute intracranial process. 2. No acute osseous injury in the cervical spine. Electronically Signed   By: Romona Curls M.D.   On: 10/14/2020 19:24    EKG: Independently reviewed.  Sinus tach 126, 1/2 mm st depression iii, v4/v5 but no other st t wave changes.   Assessment/Plan Principal Problem:   Syncope and collapse Active Problems:   Peripheral vascular disease (HCC)   Hypertension, benign   AKI (acute kidney injury) (HCC)   Tobacco  use disorder   Iron deficiency anemia due to chronic blood loss   Age-related osteoporosis without current pathological fracture   Esophagitis   Syncope and collapse: D/d include TIA/ CVA / Cardiac etiology/ VTE/ Dehydration/ Hypovolemia/ GI Bleed. We will admit to progressive unit with cardiac monitoring . -cycle troponin/ d-dimer. -MIVF at 50 cc/ hour. - neuro checks. -2 d echo/ MRI brain and USG of carotids.     PAD: -cont asa and statin.  -absent pulses in both feet pt has stents and continues to smoke I suspect she needs angiogram and vascular consult per am team.   HTN: Blood pressure (!) 110/52, pulse 85, temperature 98 F (36.7 C), resp. rate 18, height 5\' 5"  (1.651 m), weight 85 kg, SpO2 100 %. Hold home regimen of propranolol due to low blood pressure.   AKI: Lab Results  Component Value Date   CREATININE 1.45 (H) 10/14/2020   CREATININE 1.03 (H) 09/20/2019   CREATININE 1.08 (H) 05/02/2019  Attribute to hypotension from hypovolemia or dysrhythmia or GIB. We will follow and resume home meds and consider BB and or ARB per am team.   Tobacco abuse: Counseled pt  on tobacco cessation and she verbalized understanding.   IDA: Attribute to fosamax we will d/c and pt advised  to stop it.  Anemia panel/ iv ppi/ stool guaiac done by me strongly positive.   OP: Stop fosamax and avoid any nsiads and GI irritant meds.  IV ppi.   Esophagitis: Iv ppi/ gi consult per am team . Hold Gi irritant and meds. Type and scree and transfuse if hemoglobin drops below 8.    DVT prophylaxis:  SCD's    Code Status:  Full code    Family Communication:  Jody Lowe,Jody Lowe (Sister)  [phone] (Mobile)   Disposition Plan:  Home    Consults called:  Cardiology - Dr. Adrian BlackwaterShaukat khan   Admission status: Inpatient.     Gertha CalkinEkta V Khya Halls MD [facility] [phone] How to contact the [facility] Attending or Consulting provider 7A - 7P or covering provider during after hours 7P -7A, for this patient.    Check the care team in [facility] and look for a) attending/consulting [facility] provider listed and b) the [facility] team listed Log into [email or web address] and use [facility] universal password to access. If you do not have the password, please contact the hospital operator. Locate the [facility] provider you are looking for under [facility] and page to a number that you can be directly reached. If you still have  difficulty reaching the provider, please page the [facility] (Director on Call) for the Hospitalists listed on amion for assistance. [email or web address] Password [email or web address] 10/14/2020, 10:38 PM

## 2020-10-14 NOTE — ED Notes (Addendum)
Pt still in MRI during this RNs rounding.

## 2020-10-14 NOTE — ED Notes (Signed)
Hemoccult performed with positive result. Per Dr. Allena Katz, heparin drip to be held until next troponin level drawn and resulted due to possible GI bleeding.

## 2020-10-14 NOTE — Progress Notes (Signed)
ANTICOAGULATION CONSULT NOTE  Pharmacy Consult for heparin infusion Indication:  ACS/STEMI  Allergies  Allergen Reactions   Gabapentin Other (See Comments)    Nervous, shaky   Plavix [Clopidogrel Bisulfate] Rash    Patient Measurements: Height: 5\' 5"  (165.1 cm) Weight: 85 kg (187 lb 6.3 oz) IBW/kg (Calculated) : 57 Heparin Dosing Weight: 75.4 kg  Vital Signs: Temp: 98.1 F (36.7 C) (07/23 2030) Temp Source: Bladder (07/23 1845) BP: 104/49 (07/23 2030) Pulse Rate: 89 (07/23 2030)  Labs: Recent Labs    10/14/20 1745 10/14/20 1959  HGB 9.8*  --   HCT 29.4*  --   PLT 488*  --   CREATININE 1.45*  --   CKTOTAL 65  --   TROPONINIHS 67* 319*    Estimated Creatinine Clearance: 35.5 mL/min (A) (by C-G formula based on SCr of 1.45 mg/dL (H)).   Medical History: Past Medical History:  Diagnosis Date   Anemia    Hypertension    Peripheral artery disease (HCC)    Renal disorder     Medications:  Per chart review, pt not taking any anticoagulation prior to admission  Assessment: 75 yo female who presents to the emergency department today via EMS after being found down outside. Pharmacy has been consulted for heparin dosing/monitoring for ACS/STEMI. HS trop 67 >319  Baseline Hgb 9.8, Hct 29.4, Plt 488; PT-INR and aPTT pending  Goal of Therapy:  Heparin level 0.3-0.7 units/ml Monitor platelets by anticoagulation protocol: Yes   Plan:  Give 4000 units bolus x 1 Start heparin infusion at 1000 units/hr Check anti-Xa level in 8 hours and daily while on heparin Continue to monitor H&H and platelets  Jody Lowe Jody Lowe Jody Lowe 10/14/2020,9:34 PM

## 2020-10-14 NOTE — ED Triage Notes (Signed)
Pt presents to the [facility] via EMS from home with c/o heat exposure. EMS states that pt was found down outside by neighbor for unknown amount of time. Pt currently alert but confused with rectal temperature of 105.6 upon arrival to ED.

## 2020-10-14 NOTE — ED Notes (Addendum)
Dr. Allena Katz contacted regarding the patients repeat trop level - RN received verbal order to obtain repeat trop level once patient returns from MRI.

## 2020-10-15 ENCOUNTER — Inpatient Hospital Stay
Admit: 2020-10-15 | Discharge: 2020-10-15 | Disposition: A | Discharge status: Home / Self Care | Payer: Medicare Other | Attending: Internal Medicine | Admitting: Internal Medicine

## 2020-10-15 ENCOUNTER — Inpatient Hospital Stay: Payer: Medicare Other

## 2020-10-15 DIAGNOSIS — M79604 Pain in right leg: Secondary | ICD-10-CM | POA: Diagnosis not present

## 2020-10-15 DIAGNOSIS — D519 Vitamin B12 deficiency anemia, unspecified: Secondary | ICD-10-CM

## 2020-10-15 DIAGNOSIS — M25561 Pain in right knee: Secondary | ICD-10-CM

## 2020-10-15 DIAGNOSIS — R55 Syncope and collapse: Secondary | ICD-10-CM

## 2020-10-15 DIAGNOSIS — M25562 Pain in left knee: Secondary | ICD-10-CM

## 2020-10-15 DIAGNOSIS — N179 Acute kidney failure, unspecified: Secondary | ICD-10-CM

## 2020-10-15 DIAGNOSIS — R7989 Other specified abnormal findings of blood chemistry: Secondary | ICD-10-CM | POA: Insufficient documentation

## 2020-10-15 DIAGNOSIS — G8929 Other chronic pain: Secondary | ICD-10-CM | POA: Insufficient documentation

## 2020-10-15 DIAGNOSIS — R778 Other specified abnormalities of plasma proteins: Secondary | ICD-10-CM | POA: Diagnosis not present

## 2020-10-15 DIAGNOSIS — D500 Iron deficiency anemia secondary to blood loss (chronic): Secondary | ICD-10-CM

## 2020-10-15 DIAGNOSIS — I739 Peripheral vascular disease, unspecified: Secondary | ICD-10-CM

## 2020-10-15 DIAGNOSIS — M79605 Pain in left leg: Secondary | ICD-10-CM

## 2020-10-15 LAB — CBC
HCT: 27.8 % — ABNORMAL LOW (ref 36.0–46.0)
Hemoglobin: 9.1 g/dL — ABNORMAL LOW (ref 12.0–15.0)
MCH: 27.1 pg (ref 26.0–34.0)
MCHC: 32.7 g/dL (ref 30.0–36.0)
MCV: 82.7 fL (ref 80.0–100.0)
Platelets: 407 10*3/uL — ABNORMAL HIGH (ref 150–400)
RBC: 3.36 MIL/uL — ABNORMAL LOW (ref 3.87–5.11)
RDW: 15.9 % — ABNORMAL HIGH (ref 11.5–15.5)
WBC: 10.9 10*3/uL — ABNORMAL HIGH (ref 4.0–10.5)
nRBC: 0 % (ref 0.0–0.2)

## 2020-10-15 LAB — COMPREHENSIVE METABOLIC PANEL
ALT: 11 U/L (ref 0–44)
AST: 24 U/L (ref 15–41)
Albumin: 2.9 g/dL — ABNORMAL LOW (ref 3.5–5.0)
Alkaline Phosphatase: 86 U/L (ref 38–126)
Anion gap: 5 (ref 5–15)
BUN: 15 mg/dL (ref 8–23)
CO2: 23 mmol/L (ref 22–32)
Calcium: 8.5 mg/dL — ABNORMAL LOW (ref 8.9–10.3)
Chloride: 107 mmol/L (ref 98–111)
Creatinine, Ser: 1.24 mg/dL — ABNORMAL HIGH (ref 0.44–1.00)
GFR, Estimated: 45 mL/min — ABNORMAL LOW (ref >60)
Glucose, Bld: 100 mg/dL — ABNORMAL HIGH (ref 70–99)
Potassium: 3.6 mmol/L (ref 3.5–5.1)
Sodium: 135 mmol/L (ref 135–145)
Total Bilirubin: 0.7 mg/dL (ref 0.3–1.2)
Total Protein: 6.3 g/dL — ABNORMAL LOW (ref 6.5–8.1)

## 2020-10-15 LAB — ECHOCARDIOGRAM COMPLETE
AR max vel: 2.33 cm2
AV Area VTI: 2.54 cm2
AV Area mean vel: 2.49 cm2
AV Mean grad: 4 mmHg
AV Peak grad: 7.1 mmHg
Ao pk vel: 1.33 m/s
Area-P 1/2: 3.73 cm2
Height: 65 in
S' Lateral: 2.97 cm
Weight: 2998.26 oz

## 2020-10-15 LAB — HEPARIN LEVEL (UNFRACTIONATED): Heparin Unfractionated: <0.1 IU/mL — ABNORMAL LOW (ref 0.30–0.70)

## 2020-10-15 LAB — ABO/RH: ABO/RH(D): O POS

## 2020-10-15 LAB — URIC ACID: Uric Acid, Serum: 7.1 mg/dL (ref 2.5–7.1)

## 2020-10-15 LAB — TROPONIN I (HIGH SENSITIVITY)
Troponin I (High Sensitivity): 358 ng/L (ref <18)
Troponin I (High Sensitivity): 412 ng/L (ref <18)

## 2020-10-15 LAB — VITAMIN B12: Vitamin B-12: 112 pg/mL — ABNORMAL LOW (ref 180–914)

## 2020-10-15 MED ORDER — HEPARIN (PORCINE) 25000 UT/250ML-% IV SOLN
1300.0000 [IU]/h | INTRAVENOUS | Status: DC
  Administered 2020-10-15: 1000 [IU]/h via INTRAVENOUS
  Administered 2020-10-16: 1300 [IU]/h via INTRAVENOUS
  Filled 2020-10-15: qty 250

## 2020-10-15 MED ORDER — COLCHICINE 0.6 MG PO TABS
0.6000 mg | ORAL_TABLET | Freq: Every day | ORAL | Status: DC
  Administered 2020-10-15 – 2020-10-17 (×3): 0.6 mg via ORAL
  Filled 2020-10-15 (×3): qty 1

## 2020-10-15 MED ORDER — SODIUM CHLORIDE 0.9 % IV SOLN
400.0000 mg | Freq: Once | INTRAVENOUS | Status: AC
  Administered 2020-10-15: 400 mg via INTRAVENOUS
  Filled 2020-10-15: qty 20

## 2020-10-15 MED ORDER — HEPARIN BOLUS VIA INFUSION
2250.0000 [IU] | Freq: Once | INTRAVENOUS | Status: AC
  Administered 2020-10-15: 2250 [IU] via INTRAVENOUS
  Filled 2020-10-15: qty 2250

## 2020-10-15 MED ORDER — CYANOCOBALAMIN 1000 MCG/ML IJ SOLN
1000.0000 ug | Freq: Every day | INTRAMUSCULAR | Status: AC
  Administered 2020-10-15 – 2020-10-17 (×3): 1000 ug via INTRAMUSCULAR
  Filled 2020-10-15 (×4): qty 1

## 2020-10-15 NOTE — Progress Notes (Signed)
ANTICOAGULATION CONSULT NOTE  Pharmacy Consult for heparin infusion Indication:  ACS/STEMI  Allergies  Allergen Reactions   Gabapentin Other (See Comments)    Nervous, shaky   Plavix [Clopidogrel Bisulfate] Rash    Patient Measurements: Height: 5\' 5"  (165.1 cm) Weight: 85 kg (187 lb 6.3 oz) IBW/kg (Calculated) : 57 Heparin Dosing Weight: 75.4 kg  Vital Signs: Temp: 98.6 F (37 C) (07/24 0333) Temp Source: Bladder (07/23 1845) BP: 114/58 (07/24 0333) Pulse Rate: 76 (07/24 0333)  Labs: Recent Labs    10/14/20 1745 10/14/20 1959 10/14/20 2254 10/15/20 0250  HGB 9.8*  --   --  9.1*  HCT 29.4*  --   --  27.8*  PLT 488*  --   --  407*  APTT  --   --  33  --   LABPROT  --   --  14.8  --   INR  --   --  1.2  --   CREATININE 1.45*  --   --  1.24*  CKTOTAL 65  --   --   --   TROPONINIHS 67* 319* 358* 412*     Estimated Creatinine Clearance: 41.6 mL/min (A) (by C-G formula based on SCr of 1.24 mg/dL (H)).   Medical History: Past Medical History:  Diagnosis Date   Anemia    Hypertension    Peripheral artery disease (HCC)    Renal disorder     Medications:  Per chart review, pt not taking any anticoagulation prior to admission  Assessment: 75 yo female who presents to the emergency department today via EMS after being found down outside. Pharmacy has been consulted for heparin dosing/monitoring for ACS/STEMI. HS trop 67 >319  Baseline Hgb 9.8, Hct 29.4, Plt 488; PT-INR and aPTT pending  Goal of Therapy:  Heparin level 0.3-0.7 units/ml Monitor platelets by anticoagulation protocol: Yes   Plan:  7/24:  Heparin drip ordered earlier was d/c'd by Dr 8/24 but restarted by Dr Renaldo Reel due to rising troponins.  Pt did not receive any heparin from previous order.  Will begin heparin drip @ 1000 units/hr but no bolus per MD. Will check HL 8 hrs after start of drip.   Annaston Upham D 10/15/2020,4:06 AM

## 2020-10-15 NOTE — ED Notes (Signed)
Patient received to room  34 via hospital stretcher she is awake and alert Ox4.  Denies complaints at this time, cardiac monitor continued.  Call bell in reach, bed low and locked.

## 2020-10-15 NOTE — ED Notes (Signed)
Phlebotomy contacted about labs

## 2020-10-15 NOTE — Progress Notes (Signed)
Patient ID: Jody Lowe, female   DOB: October 25, 1945, 75 y.o.   MRN: [id number] [facility] PROGRESS NOTE  Jody Lowe IOM:[id number] DOB: 1945/04/18 DOA: 10/14/2020 PCP: Idelia Salm, MD  HPI/Subjective: Patient does not have any chest pain or shortness of breath.  She passed out and does not recall anything about the episode.  Complains of leg pain and knee pain.  Objective: Vitals:   10/15/20 0903 10/15/20 1130  BP: 128/68 (!) 117/58  Pulse: 75 71  Resp: 14 15  Temp: 98.5 F (36.9 C) 98.3 F (36.8 C)  SpO2: 100% 96%    Intake/Output Summary (Last 24 hours) at 10/15/2020 1406 Last data filed at 10/15/2020 0423 Gross per 24 hour  Intake --  Output 950 ml  Net -950 ml   Filed Weights   10/14/20 1804  Weight: 85 kg    ROS: Review of Systems  Respiratory:  Negative for shortness of breath.   Cardiovascular:  Negative for chest pain.  Gastrointestinal:  Negative for abdominal pain, nausea and vomiting.  Exam: Physical Exam HENT:     Head: Normocephalic.     Mouth/Throat:     Pharynx: No oropharyngeal exudate.  Eyes:     General: Lids are normal.     Conjunctiva/sclera: Conjunctivae normal.  Cardiovascular:     Rate and Rhythm: Normal rate and regular rhythm.     Heart sounds: Normal heart sounds, S1 normal and S2 normal.  Pulmonary:     Breath sounds: Examination of the right-lower field reveals decreased breath sounds. Examination of the left-lower field reveals decreased breath sounds. Decreased breath sounds present. No wheezing, rhonchi or rales.  Abdominal:     Palpations: Abdomen is soft.     Tenderness: There is no abdominal tenderness.  Musculoskeletal:     Right lower leg: Swelling present.     Left lower leg: Swelling present.  Skin:    General: Skin is warm.     Findings: No rash.  Neurological:     Mental Status: She is alert.     Comments: Answers all questions appropriately.     Data Reviewed: Basic Metabolic Panel: Recent  Labs  Lab 10/14/20 1745 10/15/20 0250  NA 134* 135  K 3.8 3.6  CL 106 107  CO2 22 23  GLUCOSE 108* 100*  BUN 16 15  CREATININE 1.45* 1.24*  CALCIUM 9.0 8.5*   Liver Function Tests: Recent Labs  Lab 10/15/20 0250  AST 24  ALT 11  ALKPHOS 86  BILITOT 0.7  PROT 6.3*  ALBUMIN 2.9*    CBC: Recent Labs  Lab 10/14/20 1745 10/15/20 0250  WBC 6.2 10.9*  NEUTROABS 3.4  --   HGB 9.8* 9.1*  HCT 29.4* 27.8*  MCV 81.4 82.7  PLT 488* 407*   Cardiac Enzymes: Recent Labs  Lab 10/14/20 1745  CKTOTAL 65     Recent Results (from the past 240 hour(s))  Resp Panel by RT-PCR (Flu A&B, Covid) Nasopharyngeal Swab     Status: None   Collection Time: 10/14/20  5:45 PM   Specimen: Nasopharyngeal Swab; Nasopharyngeal(NP) swabs in vial transport medium  Result Value Ref Range Status   SARS Coronavirus 2 by RT PCR NEGATIVE NEGATIVE Final    Comment: (NOTE) SARS-CoV-2 target nucleic acids are NOT DETECTED.  The SARS-CoV-2 RNA is generally detectable in upper respiratory specimens during the acute phase of infection. The lowest concentration of SARS-CoV-2 viral copies this assay can detect is 138 copies/mL. A negative result does  not preclude SARS-Cov-2 infection and should not be used as the sole basis for treatment or other patient management decisions. A negative result may occur with  improper specimen collection/handling, submission of specimen other than nasopharyngeal swab, presence of viral mutation(s) within the areas targeted by this assay, and inadequate number of viral copies(<138 copies/mL). A negative result must be combined with clinical observations, patient history, and epidemiological information. The expected result is Negative.  Fact Sheet for Patients:  BloggerCourse.com  Fact Sheet for Healthcare Providers:  SeriousBroker.it  This test is no t yet approved or cleared by the [place] FDA and  has been  authorized for detection and/or diagnosis of SARS-CoV-2 by FDA under an Emergency Use Authorization (EUA). This EUA will remain  in effect (meaning this test can be used) for the duration of the COVID-19 declaration under Section 564(b)(1) of the Act, 21 U.S.C.section 360bbb-3(b)(1), unless the authorization is terminated  or revoked sooner.       Influenza A by PCR NEGATIVE NEGATIVE Final   Influenza B by PCR NEGATIVE NEGATIVE Final    Comment: (NOTE) The Xpert Xpress SARS-CoV-2/FLU/RSV plus assay is intended as an aid in the diagnosis of influenza from Nasopharyngeal swab specimens and should not be used as a sole basis for treatment. Nasal washings and aspirates are unacceptable for Xpert Xpress SARS-CoV-2/FLU/RSV testing.  Fact Sheet for Patients: BloggerCourse.com  Fact Sheet for Healthcare Providers: SeriousBroker.it  This test is not yet approved or cleared by the [place] FDA and has been authorized for detection and/or diagnosis of SARS-CoV-2 by FDA under an Emergency Use Authorization (EUA). This EUA will remain in effect (meaning this test can be used) for the duration of the COVID-19 declaration under Section 564(b)(1) of the Act, 21 U.S.C. section 360bbb-3(b)(1), unless the authorization is terminated or revoked.  Performed at [facility], [address] [place], [place] [address]   Blood culture (routine x 2)     Status: None (Preliminary result)   Collection Time: 10/14/20  7:59 PM   Specimen: BLOOD  Result Value Ref Range Status   Specimen Description BLOOD RIGHT ASSIST CONTROL  Final   Special Requests   Final    BOTTLES DRAWN AEROBIC AND ANAEROBIC Blood Culture results may not be optimal due to an inadequate volume of blood received in culture bottles   Culture   Final    NO GROWTH < 12 HOURS Performed at [facility], [address], [place], [place] [address]    Report Status  PENDING  Incomplete  Blood culture (routine x 2)     Status: None (Preliminary result)   Collection Time: 10/14/20  8:00 PM   Specimen: BLOOD  Result Value Ref Range Status   Specimen Description BLOOD LEFT ASSIST CONTROL  Final   Special Requests   Final    BOTTLES DRAWN AEROBIC AND ANAEROBIC Blood Culture results may not be optimal due to an inadequate volume of blood received in culture bottles   Culture   Final    NO GROWTH < 12 HOURS Performed at [facility], [address], [place], [place] [address]    Report Status PENDING  Incomplete     Studies: DG Knee 1-2 Views Left  Result Date: 10/15/2020 CLINICAL DATA:  Chronic knee pain. EXAM: LEFT KNEE - 1-2 VIEW COMPARISON:  None. FINDINGS: Marked loss of joint space noted medial compartment with mild joint space loss laterally. Hypertrophic spurring is noted in all 3 compartments. Probable tiny joint  effusion. No worrisome lytic or sclerotic osseous abnormality. IMPRESSION: Tricompartmental degenerative changes without acute bony findings. Electronically Signed   By: Kennith Center M.D.   On: 10/15/2020 08:52   DG Knee 1-2 Views Right  Result Date: 10/15/2020 CLINICAL DATA:  Chronic knee pain. EXAM: RIGHT KNEE - 1-2 VIEW COMPARISON:  None. FINDINGS: Two-view exam shows no evidence for an acute fracture. Loss of joint space noted in both the medial and lateral compartments, medial compartment more affected. Hypertrophic spurring in all 3 compartments is moderate. Possible small joint effusion. Vascular stent device noted lower thigh. IMPRESSION: 1. Tricompartmental degenerative changes without acute bony abnormality. 2. Possible small joint effusion. Electronically Signed   By: Kennith Center M.D.   On: 10/15/2020 08:51   CT Head Wo Contrast  Result Date: 10/14/2020 CLINICAL DATA:  Fall with head and neck pain. EXAM: CT HEAD WITHOUT CONTRAST CT CERVICAL SPINE WITHOUT CONTRAST TECHNIQUE: Multidetector CT imaging of the head and  cervical spine was performed following the standard protocol without intravenous contrast. Multiplanar CT image reconstructions of the cervical spine were also generated. COMPARISON:  CT head dated 09/20/2019. FINDINGS: CT HEAD FINDINGS Brain: No evidence of acute infarction, hemorrhage, hydrocephalus, extra-axial collection or mass lesion/mass effect. Periventricular white matter hypoattenuation likely represents chronic small vessel ischemic disease. Vascular: There are vascular calcifications in the carotid siphons. Skull: Normal. Negative for fracture or focal lesion. Sinuses/Orbits: No acute finding. Other: There is soft tissue swelling overlying the right forehead and frontal bone. CT CERVICAL SPINE FINDINGS Alignment: Normal. Skull base and vertebrae: No acute fracture. No primary bone lesion or focal pathologic process. Soft tissues and spinal canal: No prevertebral fluid or swelling. No visible canal hematoma. Disc levels: Up to moderate to severe multilevel degenerative disc and joint disease. Upper chest: Negative. Other: None. IMPRESSION: 1. No acute intracranial process. 2. No acute osseous injury in the cervical spine. Electronically Signed   By: Romona Curls M.D.   On: 10/14/2020 19:24   CT Cervical Spine Wo Contrast  Result Date: 10/14/2020 CLINICAL DATA:  Fall with head and neck pain. EXAM: CT HEAD WITHOUT CONTRAST CT CERVICAL SPINE WITHOUT CONTRAST TECHNIQUE: Multidetector CT imaging of the head and cervical spine was performed following the standard protocol without intravenous contrast. Multiplanar CT image reconstructions of the cervical spine were also generated. COMPARISON:  CT head dated 09/20/2019. FINDINGS: CT HEAD FINDINGS Brain: No evidence of acute infarction, hemorrhage, hydrocephalus, extra-axial collection or mass lesion/mass effect. Periventricular white matter hypoattenuation likely represents chronic small vessel ischemic disease. Vascular: There are vascular calcifications in  the carotid siphons. Skull: Normal. Negative for fracture or focal lesion. Sinuses/Orbits: No acute finding. Other: There is soft tissue swelling overlying the right forehead and frontal bone. CT CERVICAL SPINE FINDINGS Alignment: Normal. Skull base and vertebrae: No acute fracture. No primary bone lesion or focal pathologic process. Soft tissues and spinal canal: No prevertebral fluid or swelling. No visible canal hematoma. Disc levels: Up to moderate to severe multilevel degenerative disc and joint disease. Upper chest: Negative. Other: None. IMPRESSION: 1. No acute intracranial process. 2. No acute osseous injury in the cervical spine. Electronically Signed   By: Romona Curls M.D.   On: 10/14/2020 19:24   MR BRAIN WO CONTRAST  Result Date: 10/15/2020 CLINICAL DATA:  Recurrent syncope EXAM: MRI HEAD WITHOUT CONTRAST TECHNIQUE: Multiplanar, multiecho pulse sequences of the brain and surrounding structures were obtained without intravenous contrast. COMPARISON:  05/01/2019 FINDINGS: Brain: No acute infarct, mass effect or extra-axial collection.  No acute or chronic hemorrhage. There is multifocal hyperintense T2-weighted signal within the white matter. Parenchymal volume and CSF spaces are normal. The midline structures are normal. Vascular: Major flow voids are preserved. Skull and upper cervical spine: Right frontal scalp hematoma Sinuses/Orbits:No paranasal sinus fluid levels or advanced mucosal thickening. No mastoid or middle ear effusion. Normal orbits. IMPRESSION: 1. No acute intracranial abnormality. 2. Findings of mild chronic small vessel disease. 3. Right frontal scalp hematoma. Electronically Signed   By: Deatra Robinson M.D.   On: 10/15/2020 00:01   US Carotid Bilateral  Result Date: 10/14/2020 CLINICAL DATA:  Syncope EXAM: BILATERAL CAROTID DUPLEX ULTRASOUND TECHNIQUE: Wallace Cullens scale imaging, color Doppler and duplex ultrasound were performed of bilateral carotid and vertebral arteries in the neck.  COMPARISON:  05/01/2019 FINDINGS: Criteria: Quantification of carotid stenosis is based on velocity parameters that correlate the residual internal carotid diameter with NASCET-based stenosis levels, using the diameter of the distal internal carotid lumen as the denominator for stenosis measurement. The following velocity measurements were obtained: RIGHT ICA: 158 cm/sec CCA: 93 cm/sec SYSTOLIC ICA/CCA RATIO:  1.5 ECA: 52 cm/sec LEFT ICA: 195 cm/sec CCA: 82 cm/sec SYSTOLIC ICA/CCA RATIO:  2.3 ECA: 190 cm/sec RIGHT CAROTID ARTERY: Mild atherosclerosis at the carotid bifurcation. Normal waveform. RIGHT VERTEBRAL ARTERY:  Antegrade flow with normal waveform LEFT CAROTID ARTERY: Moderate atherosclerosis, predominantly affecting the ICA origin, but also narrowing the proximal ECA. LEFT VERTEBRAL ARTERY:  Antegrade flow with normal waveform IMPRESSION: 1. 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 2. Antegrade flow in the vertebral arteries. Electronically Signed   By: Deatra Robinson M.D.   On: 10/14/2020 23:00   ECHOCARDIOGRAM COMPLETE  Result Date: 10/15/2020    ECHOCARDIOGRAM REPORT   Patient Name:   Jody Lowe Date of Exam: 10/15/2020 Medical Rec #:  [id number]      Height:       65.0 in Accession #:    [id number]     Weight:       187.4 lb Date of Birth:  Mar 28, 1945     BSA:          1.924 m Patient Age:    74 years       BP:           121/49 mmHg Patient Gender: F              HR:           76 bpm. Exam Location:  [facility] Procedure: 2D Echo Indications:     Syncope  History:         Patient has prior history of Echocardiogram examinations. TIA;                  Risk Factors:Hypertension.  Sonographer:     L Thornton-Maynard Referring Phys:  [id number] GUYQ I PATEL Diagnosing Phys: Adrian Blackwater MD IMPRESSIONS  1. Left ventricular ejection fraction, by estimation, is 60 to 65%. The left ventricle has normal function. The left ventricle has no regional wall motion  abnormalities. Left ventricular diastolic parameters are consistent with Grade I diastolic dysfunction (impaired relaxation).  2. Right ventricular systolic function is normal. The right ventricular size is normal. There is normal pulmonary artery systolic pressure.  3. The mitral valve is normal in structure. Mild mitral valve regurgitation. No evidence of mitral stenosis.  4. The aortic valve is normal in structure. Aortic valve regurgitation is not visualized. No aortic stenosis is present.  5.  The inferior vena cava is normal in size with greater than 50% respiratory variability, suggesting right atrial pressure of 3 mmHg. FINDINGS  Left Ventricle: Left ventricular ejection fraction, by estimation, is 60 to 65%. The left ventricle has normal function. The left ventricle has no regional wall motion abnormalities. The left ventricular internal cavity size was normal in size. There is  no left ventricular hypertrophy. Left ventricular diastolic parameters are consistent with Grade I diastolic dysfunction (impaired relaxation). Right Ventricle: The right ventricular size is normal. No increase in right ventricular wall thickness. Right ventricular systolic function is normal. There is normal pulmonary artery systolic pressure. The tricuspid regurgitant velocity is 2.69 m/s, and  with an assumed right atrial pressure of 3 mmHg, the estimated right ventricular systolic pressure is 31.9 mmHg. Left Atrium: Left atrial size was normal in size. Right Atrium: Right atrial size was normal in size. Pericardium: There is no evidence of pericardial effusion. Mitral Valve: The mitral valve is normal in structure. Mild mitral valve regurgitation. No evidence of mitral valve stenosis. Tricuspid Valve: The tricuspid valve is normal in structure. Tricuspid valve regurgitation is mild . No evidence of tricuspid stenosis. Aortic Valve: The aortic valve is normal in structure. Aortic valve regurgitation is not visualized. No aortic  stenosis is present. Aortic valve mean gradient measures 4.0 mmHg. Aortic valve peak gradient measures 7.1 mmHg. Aortic valve area, by VTI measures 2.54 cm. Pulmonic Valve: The pulmonic valve was normal in structure. Pulmonic valve regurgitation is not visualized. No evidence of pulmonic stenosis. Aorta: The aortic root is normal in size and structure. Venous: The inferior vena cava is normal in size with greater than 50% respiratory variability, suggesting right atrial pressure of 3 mmHg. IAS/Shunts: No atrial level shunt detected by color flow Doppler.  LEFT VENTRICLE PLAX 2D LVIDd:         4.68 cm  Diastology LVIDs:         2.97 cm  LV e' medial:    8.49 cm/s LV PW:         1.01 cm  LV E/e' medial:  11.2 LV IVS:        1.08 cm  LV e' lateral:   8.49 cm/s LVOT diam:     2.10 cm  LV E/e' lateral: 11.2 LV SV:         81 LV SV Index:   42 LVOT Area:     3.46 cm  RIGHT VENTRICLE RV S prime:     11.70 cm/s TAPSE (M-mode): 2.8 cm LEFT ATRIUM             Index       RIGHT ATRIUM           Index LA diam:        3.10 cm 1.61 cm/m  RA Area:     14.30 cm LA Vol (A2C):   62.8 ml 32.64 ml/m RA Volume:   33.40 ml  17.36 ml/m LA Vol (A4C):   63.4 ml 32.95 ml/m LA Biplane Vol: 63.0 ml 32.74 ml/m  AORTIC VALVE                   PULMONIC VALVE AV Area (Vmax):    2.33 cm    PV Vmax:       0.96 m/s AV Area (Vmean):   2.49 cm    PV Peak grad:  3.7 mmHg AV Area (VTI):     2.54 cm AV Vmax:  133.00 cm/s AV Vmean:          94.000 cm/s AV VTI:            0.321 m AV Peak Grad:      7.1 mmHg AV Mean Grad:      4.0 mmHg LVOT Vmax:         89.40 cm/s LVOT Vmean:        67.700 cm/s LVOT VTI:          0.235 m LVOT/AV VTI ratio: 0.73  AORTA Ao Root diam: 2.80 cm MITRAL VALVE               TRICUSPID VALVE MV Area (PHT): 3.73 cm    TR Peak grad:   28.9 mmHg MV E velocity: 94.70 cm/s  TR Vmax:        269.00 cm/s MV A velocity: 96.00 cm/s MV E/A ratio:  0.99        SHUNTS                            Systemic VTI:  0.24 m                             Systemic Diam: 2.10 cm Adrian BlackwaterShaukat Khan MD Electronically signed by Adrian BlackwaterShaukat Khan MD Signature Date/Time: 10/15/2020/9:46:41 AM    Final     Scheduled Meds:  amitriptyline  25 mg Oral QHS   aspirin EC  81 mg Oral Daily   colchicine  0.6 mg Oral Daily   cyanocobalamin  1,000 mcg Intramuscular Daily   ferrous sulfate  325 mg Oral QODAY   FLUoxetine  80 mg Oral Daily   fluticasone  1 spray Each Nare Daily   ipratropium  2 spray Each Nare TID   mirabegron ER  25 mg Oral Daily   nicotine  21 mg Transdermal Daily   oxybutynin  10 mg Oral BID   pantoprazole  40 mg Oral Daily   Continuous Infusions:  sodium chloride 50 mL/hr at 10/15/20 0851   heparin 1,000 Units/hr (10/15/20 0422)   iron sucrose      Assessment/Plan:  Syncope and collapse.  MRI of the brain negative.  Carotid ultrasound does show plaque in the carotids (I do not want to get a CTA currently with creatinine improving from yesterday to today.  Check orthostatic vital signs. Elevated troponin.  Nighttime physician ordered heparin drip.  Patient not having chest pain or shortness of breath.  On low-dose aspirin.  EF normal. Leg pain and knee pain.  History of PAD. bilateral knee x-ray shows tricompartmental arthritis.  Family interested in a wheelchair.  We will get an ABI.  With uric acid being on the higher normal range we will start uric acid and see if that helps out her pains Iron deficiency anemia and severe B12 deficiency.  IM B12 while here in the hospital and switch over to oral B12 upon disposition.  We will give IV iron today.  Watch hemoglobin closely while on heparin drip. Acute kidney injury.  Creatinine 1.45 on presentation and down to 1.24.  Baseline around 1.03        Code Status:     Code Status Orders  (From admission, onward)           Start     Ordered   10/14/20 2148  Full code  Continuous  10/14/20 2153           Code Status History     Date Active Date Inactive Code  Status Order ID Comments User Context   05/01/2019 1414 05/03/2019 2201 Full Code [id number]  Gertha Calkin, MD ED      Family Communication: Spoke with son on the phone Disposition Plan: Status is: Inpatient  Dispo: The patient is from: Home              Anticipated d/c is to: Home versus rehab depending on how she does with physical therapy.  Family concerned about her living alone.              Patient currently with elevated troponin on heparin drip, acute kidney injury, bilateral leg pain and knee pain.   Difficult to place patient.  No.  Consultants: Cardiology  Time spent: 32 minutes  [employer]ve[employer]

## 2020-10-15 NOTE — ED Notes (Signed)
Dr. Para March made aware of the singular IV access at this time due to the other being accidentally removed. MD to place IV team consult.

## 2020-10-15 NOTE — Progress Notes (Signed)
Cross coverage note  Heparin started for possible NSTEMI   Troponin [phone]  (No bolus given due to concern for H&H, per previous signout from Dr. Allena Katz)

## 2020-10-15 NOTE — Progress Notes (Signed)
ANTICOAGULATION CONSULT NOTE  Pharmacy Consult for heparin infusion Indication:  ACS/STEMI  Allergies  Allergen Reactions   Gabapentin Other (See Comments)    Nervous, shaky   Plavix [Clopidogrel Bisulfate] Rash    Patient Measurements: Height: 5\' 5"  (165.1 cm) Weight: 85 kg (187 lb 6.3 oz) IBW/kg (Calculated) : 57 Heparin Dosing Weight: 75.4 kg  Vital Signs: Temp: 98.8 F (37.1 C) (07/24 1430) BP: 121/50 (07/24 1430) Pulse Rate: 77 (07/24 1430)  Labs: Recent Labs    10/14/20 1745 10/14/20 1959 10/14/20 2254 10/15/20 0250 10/15/20 1349  HGB 9.8*  --   --  9.1*  --   HCT 29.4*  --   --  27.8*  --   PLT 488*  --   --  407*  --   APTT  --   --  33  --   --   LABPROT  --   --  14.8  --   --   INR  --   --  1.2  --   --   HEPARINUNFRC  --   --   --   --  <0.10*  CREATININE 1.45*  --   --  1.24*  --   CKTOTAL 65  --   --   --   --   TROPONINIHS 67* 319* 358* 412*  --      Estimated Creatinine Clearance: 41.6 mL/min (A) (by C-G formula based on SCr of 1.24 mg/dL (H)).   Medical History: Past Medical History:  Diagnosis Date   Anemia    Hypertension    Peripheral artery disease (HCC)    Renal disorder     Medications:  Per chart review, pt not taking any anticoagulation prior to admission  Assessment: 75 yo female who presents to the emergency department today via EMS after being found down outside. Pharmacy has been consulted for heparin dosing/monitoring for ACS/STEMI. HS trop 67 >319  7/24 1349 HL < 0.10  Goal of Therapy:  Heparin level 0.3-0.7 units/ml Monitor platelets by anticoagulation protocol: Yes   Plan:  Heparin level is subtherapeutic. Will give 2250 units bolus and increase heparin infusion to 1300 units/hr. Recheck heparin level in 8 hours. CBC daily while on heparin.    8/24, PharmD,  10/15/2020,3:12 PM

## 2020-10-15 NOTE — Consult Note (Addendum)
Error   Pt followed by Dr Harless Nakayama MD

## 2020-10-15 NOTE — Consult Note (Signed)
Jody Lowe is a 75 y.o. Lowe  [id number]  Primary Cardiologist: Adrian Blackwater Reason for Consultation: Elevated troponin  HPI: This is a 75 year old Jody Lowe who apparently was outside in heat and came in after she fell down and was probably having heatstroke and had EKG showing sinus tachycardia.  I was called by the ER physician because the troponin was between 300-400 range as to start the heparin or not. I was also called by Dr. Samara Deist, that she had guaiac positive stools and she was reluctant to start heparin.  Review of Systems: Patient denies any chest pain   Past Medical History:  Diagnosis Date   Anemia    Hypertension    Peripheral artery disease (HCC)    Renal disorder     (Not in a hospital admission)     amitriptyline  25 mg Oral QHS   aspirin EC  81 mg Oral Daily   ferrous sulfate  325 mg Oral QODAY   FLUoxetine  80 mg Oral Daily   fluticasone  1 spray Each Nare Daily   ipratropium  2 spray Each Nare TID   mirabegron ER  25 mg Oral Daily   nicotine  21 mg Transdermal Daily   oxybutynin  10 mg Oral BID   pantoprazole  40 mg Oral Daily    Infusions:  sodium chloride 50 mL/hr at 10/15/20 0851   heparin 1,000 Units/hr (10/15/20 0422)    Allergies  Allergen Reactions   Gabapentin Other (See Comments)    Nervous, shaky   Plavix [Clopidogrel Bisulfate] Rash    Social History   Socioeconomic History   Marital status: Single    Spouse name: Not on file   Number of children: Not on file   Years of education: Not on file   Highest education level: Not on file  Occupational History   Not on file  Tobacco Use   Smoking status: Every Day    Packs/day: 0.50    Types: Cigarettes   Smokeless tobacco: Never  Substance and Sexual Activity   Alcohol use: No   Drug use: No   Sexual activity: Not on file  Other Topics Concern   Not on file  Social History Narrative   Not on file   Social Determinants of Health   Financial  Resource Strain: Not on file  Food Insecurity: Not on file  Transportation Needs: Not on file  Physical Activity: Not on file  Stress: Not on file  Social Connections: Not on file  Intimate Partner Violence: Not on file    History reviewed. No pertinent family history.  PHYSICAL EXAM: Vitals:   10/15/20 0630 10/15/20 0903  BP: (!) 109/54 128/68  Pulse: 72 75  Resp: 13 14  Temp: 98.6 F (37 C) 98.5 F (36.9 C)  SpO2: 100% 100%     Intake/Output Summary (Last 24 hours) at 10/15/2020 0948 Last data filed at 10/15/2020 0423 Gross per 24 hour  Intake --  Output 950 ml  Net -950 ml    General:  Well appearing. No respiratory difficulty HEENT: normal Neck: supple. no JVD. Carotids 2+ bilat; no bruits. No lymphadenopathy or thryomegaly appreciated. Cor: PMI nondisplaced. Regular rate & rhythm. No rubs, gallops or murmurs. Lungs: clear Abdomen: soft, nontender, nondistended. No hepatosplenomegaly. No bruits or masses. Good bowel sounds. Extremities: no cyanosis, clubbing, rash, edema Neuro: alert & oriented x 3, cranial nerves grossly intact. moves all 4 extremities w/o difficulty. Affect pleasant.  ECG: Sinus  tachycardia with nonspecific ST-T changes  Results for orders placed or performed during the hospital encounter of 10/14/20 (from the past 24 hour(s))  CK     Status: None   Collection Time: 10/14/20  5:45 PM  Result Value Ref Range   Total CK 65 38 - 234 U/L  CBC with Differential     Status: Abnormal   Collection Time: 10/14/20  5:45 PM  Result Value Ref Range   WBC 6.2 4.0 - 10.5 K/uL   RBC 3.61 (L) 3.87 - 5.11 MIL/uL   Hemoglobin 9.8 (L) 12.0 - 15.0 g/dL   HCT 60.629.4 (L) 30.136.0 - 60.146.0 %   MCV 81.4 80.0 - 100.0 fL   MCH 27.1 26.0 - 34.0 pg   MCHC 33.3 30.0 - 36.0 g/dL   RDW 09.315.8 (H) 23.511.5 - 57.315.5 %   Platelets 488 (H) 150 - 400 K/uL   nRBC 0.3 (H) 0.0 - 0.2 %   Neutrophils Relative % 56 %   Neutro Abs 3.4 1.7 - 7.7 K/uL   Lymphocytes Relative 25 %   Lymphs Abs  1.6 0.7 - 4.0 K/uL   Monocytes Relative 11 %   Monocytes Absolute 0.7 0.1 - 1.0 K/uL   Eosinophils Relative 6 %   Eosinophils Absolute 0.4 0.0 - 0.5 K/uL   Basophils Relative 1 %   Basophils Absolute 0.1 0.0 - 0.1 K/uL   Immature Granulocytes 1 %   Abs Immature Granulocytes 0.05 0.00 - 0.07 K/uL  Basic metabolic panel     Status: Abnormal   Collection Time: 10/14/20  5:45 PM  Result Value Ref Range   Sodium 134 (L) 135 - 145 mmol/L   Potassium 3.8 3.5 - 5.1 mmol/L   Chloride 106 98 - 111 mmol/L   CO2 22 22 - 32 mmol/L   Glucose, Bld 108 (H) 70 - 99 mg/dL   BUN 16 8 - 23 mg/dL   Creatinine, Ser 2.201.45 (H) 0.44 - 1.00 mg/dL   Calcium 9.0 8.9 - 25.410.3 mg/dL   GFR, Estimated 37 (L) >60 mL/min   Anion gap 6 5 - 15  Troponin I (High Sensitivity)     Status: Abnormal   Collection Time: 10/14/20  5:45 PM  Result Value Ref Range   Troponin I (High Sensitivity) 67 (H) <18 ng/L  Urinalysis, Complete w Microscopic     Status: Abnormal   Collection Time: 10/14/20  5:45 PM  Result Value Ref Range   Color, Urine YELLOW (A) YELLOW   APPearance CLEAR (A) CLEAR   Specific Gravity, Urine 1.009 1.005 - 1.030   pH 6.0 5.0 - 8.0   Glucose, UA NEGATIVE NEGATIVE mg/dL   Hgb urine dipstick NEGATIVE NEGATIVE   Bilirubin Urine NEGATIVE NEGATIVE   Ketones, ur NEGATIVE NEGATIVE mg/dL   Protein, ur NEGATIVE NEGATIVE mg/dL   Nitrite NEGATIVE NEGATIVE   Leukocytes,Ua NEGATIVE NEGATIVE   RBC / HPF 0-5 0 - 5 RBC/hpf   WBC, UA 0-5 0 - 5 WBC/hpf   Bacteria, UA NONE SEEN NONE SEEN   Squamous Epithelial / LPF 0-5 0 - 5  Lactic acid, plasma     Status: Abnormal   Collection Time: 10/14/20  5:45 PM  Result Value Ref Range   Lactic Acid, Venous 2.3 (HH) 0.5 - 1.9 mmol/L  Resp Panel by RT-PCR (Flu A&B, Covid) Nasopharyngeal Swab     Status: None   Collection Time: 10/14/20  5:45 PM   Specimen: Nasopharyngeal Swab; Nasopharyngeal(NP) swabs in vial transport medium  Result Value Ref Range   SARS Coronavirus 2 by  RT PCR NEGATIVE NEGATIVE   Influenza A by PCR NEGATIVE NEGATIVE   Influenza B by PCR NEGATIVE NEGATIVE  Lactic acid, plasma     Status: None   Collection Time: 10/14/20  7:59 PM  Result Value Ref Range   Lactic Acid, Venous 1.1 0.5 - 1.9 mmol/L  Blood culture (routine x 2)     Status: None (Preliminary result)   Collection Time: 10/14/20  7:59 PM   Specimen: BLOOD  Result Value Ref Range   Specimen Description BLOOD RIGHT ASSIST CONTROL    Special Requests      BOTTLES DRAWN AEROBIC AND ANAEROBIC Blood Culture results may not be optimal due to an inadequate volume of blood received in culture bottles   Culture      NO GROWTH < 12 HOURS Performed at [facility], [address] [place], [place] [address]    Report Status PENDING   Troponin I (High Sensitivity)     Status: Abnormal   Collection Time: 10/14/20  7:59 PM  Result Value Ref Range   Troponin I (High Sensitivity) 319 (HH) <18 ng/L  Blood culture (routine x 2)     Status: None (Preliminary result)   Collection Time: 10/14/20  8:00 PM   Specimen: BLOOD  Result Value Ref Range   Specimen Description BLOOD LEFT ASSIST CONTROL    Special Requests      BOTTLES DRAWN AEROBIC AND ANAEROBIC Blood Culture results may not be optimal due to an inadequate volume of blood received in culture bottles   Culture      NO GROWTH < 12 HOURS Performed at [facility], [address] [place], [place] [address]    Report Status PENDING   Protime-INR     Status: None   Collection Time: 10/14/20 10:54 PM  Result Value Ref Range   Prothrombin Time 14.8 11.4 - 15.2 seconds   INR 1.2 0.8 - 1.2  APTT     Status: None   Collection Time: 10/14/20 10:54 PM  Result Value Ref Range   aPTT 33 24 - 36 seconds  TSH     Status: None   Collection Time: 10/14/20 10:54 PM  Result Value Ref Range   TSH 0.777 0.350 - 4.500 uIU/mL  T4, free     Status: Abnormal   Collection Time: 10/14/20 10:54 PM  Result Value Ref Range   Free  T4 1.37 (H) 0.61 - 1.12 ng/dL  Folate     Status: Abnormal   Collection Time: 10/14/20 10:54 PM  Result Value Ref Range   Folate 5.4 (L) >5.9 ng/mL  Iron and TIBC     Status: Abnormal   Collection Time: 10/14/20 10:54 PM  Result Value Ref Range   Iron 37 28 - 170 ug/dL   TIBC 829 562 - 130 ug/dL   Saturation Ratios 10 (L) 10.4 - 31.8 %   UIBC 344 ug/dL  Ferritin     Status: Abnormal   Collection Time: 10/14/20 10:54 PM  Result Value Ref Range   Ferritin 10 (L) 11 - 307 ng/mL  Reticulocytes     Status: Abnormal   Collection Time: 10/14/20 10:54 PM  Result Value Ref Range   Retic Ct Pct 1.2 0.4 - 3.1 %   RBC. 3.58 (L) 3.87 - 5.11 MIL/uL   Retic Count, Absolute 43.0 19.0 - 186.0 K/uL   Immature Retic Fract 20.8 (H) 2.3 - 15.9 %  Type and screen     Status: None (Preliminary result)   Collection Time: 10/14/20 10:54 PM  Result Value Ref Range   ABO/RH(D) O POS    Antibody Screen POS    Sample Expiration 10/17/2020,2359    Antibody Identification ANTI K    Unit Number [id number]    Blood Component Type RED CELLS,LR    Unit division 00    Status of Unit ALLOCATED    Transfusion Status OK TO TRANSFUSE    Crossmatch Result COMPATIBLE    Unit Number [id number]    Blood Component Type RED CELLS,LR    Unit division 00    Status of Unit ALLOCATED    Transfusion Status OK TO TRANSFUSE    Crossmatch Result COMPATIBLE   Troponin I (High Sensitivity)     Status: Abnormal   Collection Time: 10/14/20 10:54 PM  Result Value Ref Range   Troponin I (High Sensitivity) 358 (HH) <18 ng/L  CBC     Status: Abnormal   Collection Time: 10/15/20  2:50 AM  Result Value Ref Range   WBC 10.9 (H) 4.0 - 10.5 K/uL   RBC 3.36 (L) 3.87 - 5.11 MIL/uL   Hemoglobin 9.1 (L) 12.0 - 15.0 g/dL   HCT 31.5 (L) 40.0 - 86.7 %   MCV 82.7 80.0 - 100.0 fL   MCH 27.1 26.0 - 34.0 pg   MCHC 32.7 30.0 - 36.0 g/dL   RDW 61.9 (H) 50.9 - 32.6 %   Platelets 407 (H) 150 - 400 K/uL   nRBC 0.0 0.0 - 0.2 %   Comprehensive metabolic panel     Status: Abnormal   Collection Time: 10/15/20  2:50 AM  Result Value Ref Range   Sodium 135 135 - 145 mmol/L   Potassium 3.6 3.5 - 5.1 mmol/L   Chloride 107 98 - 111 mmol/L   CO2 23 22 - 32 mmol/L   Glucose, Bld 100 (H) 70 - 99 mg/dL   BUN 15 8 - 23 mg/dL   Creatinine, Ser 7.12 (H) 0.44 - 1.00 mg/dL   Calcium 8.5 (L) 8.9 - 10.3 mg/dL   Total Protein 6.3 (L) 6.5 - 8.1 g/dL   Albumin 2.9 (L) 3.5 - 5.0 g/dL   AST 24 15 - 41 U/L   ALT 11 0 - 44 U/L   Alkaline Phosphatase 86 38 - 126 U/L   Total Bilirubin 0.7 0.3 - 1.2 mg/dL   GFR, Estimated 45 (L) >60 mL/min   Anion gap 5 5 - 15  Troponin I (High Sensitivity)     Status: Abnormal   Collection Time: 10/15/20  2:50 AM  Result Value Ref Range   Troponin I (High Sensitivity) 412 (HH) <18 ng/L  ABO/Rh     Status: None   Collection Time: 10/15/20  2:50 AM  Result Value Ref Range   ABO/RH(D)      O POS Performed at [facility], [address] [place], [place] [address]   Uric acid     Status: None   Collection Time: 10/15/20  2:50 AM  Result Value Ref Range   Uric Acid, Serum 7.1 2.5 - 7.1 mg/dL   DG Knee 1-2 Views Left  Result Date: 10/15/2020 CLINICAL DATA:  Chronic knee pain. EXAM: LEFT KNEE - 1-2 VIEW COMPARISON:  None. FINDINGS: Marked loss of joint space noted medial compartment with mild joint space loss laterally. Hypertrophic spurring is noted in all 3 compartments. Probable tiny joint effusion. No worrisome lytic or sclerotic  osseous abnormality. IMPRESSION: Tricompartmental degenerative changes without acute bony findings. Electronically Signed   By: Kennith Center M.D.   On: 10/15/2020 08:52   DG Knee 1-2 Views Right  Result Date: 10/15/2020 CLINICAL DATA:  Chronic knee pain. EXAM: RIGHT KNEE - 1-2 VIEW COMPARISON:  None. FINDINGS: Two-view exam shows no evidence for an acute fracture. Loss of joint space noted in both the medial and lateral compartments, medial compartment more  affected. Hypertrophic spurring in all 3 compartments is moderate. Possible small joint effusion. Vascular stent device noted lower thigh. IMPRESSION: 1. Tricompartmental degenerative changes without acute bony abnormality. 2. Possible small joint effusion. Electronically Signed   By: Kennith Center M.D.   On: 10/15/2020 08:51   CT Head Wo Contrast  Result Date: 10/14/2020 CLINICAL DATA:  Fall with head and neck pain. EXAM: CT HEAD WITHOUT CONTRAST CT CERVICAL SPINE WITHOUT CONTRAST TECHNIQUE: Multidetector CT imaging of the head and cervical spine was performed following the standard protocol without intravenous contrast. Multiplanar CT image reconstructions of the cervical spine were also generated. COMPARISON:  CT head dated 09/20/2019. FINDINGS: CT HEAD FINDINGS Brain: No evidence of acute infarction, hemorrhage, hydrocephalus, extra-axial collection or mass lesion/mass effect. Periventricular white matter hypoattenuation likely represents chronic small vessel ischemic disease. Vascular: There are vascular calcifications in the carotid siphons. Skull: Normal. Negative for fracture or focal lesion. Sinuses/Orbits: No acute finding. Other: There is soft tissue swelling overlying the right forehead and frontal bone. CT CERVICAL SPINE FINDINGS Alignment: Normal. Skull base and vertebrae: No acute fracture. No primary bone lesion or focal pathologic process. Soft tissues and spinal canal: No prevertebral fluid or swelling. No visible canal hematoma. Disc levels: Up to moderate to severe multilevel degenerative disc and joint disease. Upper chest: Negative. Other: None. IMPRESSION: 1. No acute intracranial process. 2. No acute osseous injury in the cervical spine. Electronically Signed   By: Romona Curls M.D.   On: 10/14/2020 19:24   CT Cervical Spine Wo Contrast  Result Date: 10/14/2020 CLINICAL DATA:  Fall with head and neck pain. EXAM: CT HEAD WITHOUT CONTRAST CT CERVICAL SPINE WITHOUT CONTRAST TECHNIQUE:  Multidetector CT imaging of the head and cervical spine was performed following the standard protocol without intravenous contrast. Multiplanar CT image reconstructions of the cervical spine were also generated. COMPARISON:  CT head dated 09/20/2019. FINDINGS: CT HEAD FINDINGS Brain: No evidence of acute infarction, hemorrhage, hydrocephalus, extra-axial collection or mass lesion/mass effect. Periventricular white matter hypoattenuation likely represents chronic small vessel ischemic disease. Vascular: There are vascular calcifications in the carotid siphons. Skull: Normal. Negative for fracture or focal lesion. Sinuses/Orbits: No acute finding. Other: There is soft tissue swelling overlying the right forehead and frontal bone. CT CERVICAL SPINE FINDINGS Alignment: Normal. Skull base and vertebrae: No acute fracture. No primary bone lesion or focal pathologic process. Soft tissues and spinal canal: No prevertebral fluid or swelling. No visible canal hematoma. Disc levels: Up to moderate to severe multilevel degenerative disc and joint disease. Upper chest: Negative. Other: None. IMPRESSION: 1. No acute intracranial process. 2. No acute osseous injury in the cervical spine. Electronically Signed   By: Romona Curls M.D.   On: 10/14/2020 19:24   MR BRAIN WO CONTRAST  Result Date: 10/15/2020 CLINICAL DATA:  Recurrent syncope EXAM: MRI HEAD WITHOUT CONTRAST TECHNIQUE: Multiplanar, multiecho pulse sequences of the brain and surrounding structures were obtained without intravenous contrast. COMPARISON:  05/01/2019 FINDINGS: Brain: No acute infarct, mass effect or extra-axial collection. No acute or chronic hemorrhage. There  is multifocal hyperintense T2-weighted signal within the white matter. Parenchymal volume and CSF spaces are normal. The midline structures are normal. Vascular: Major flow voids are preserved. Skull and upper cervical spine: Right frontal scalp hematoma Sinuses/Orbits:No paranasal sinus fluid  levels or advanced mucosal thickening. No mastoid or middle ear effusion. Normal orbits. IMPRESSION: 1. No acute intracranial abnormality. 2. Findings of mild chronic small vessel disease. 3. Right frontal scalp hematoma. Electronically Signed   By: Deatra Robinson M.D.   On: 10/15/2020 00:01   US Carotid Bilateral  Result Date: 10/14/2020 CLINICAL DATA:  Syncope EXAM: BILATERAL CAROTID DUPLEX ULTRASOUND TECHNIQUE: Wallace Cullens scale imaging, color Doppler and duplex ultrasound were performed of bilateral carotid and vertebral arteries in the neck. COMPARISON:  05/01/2019 FINDINGS: Criteria: Quantification of carotid stenosis is based on velocity parameters that correlate the residual internal carotid diameter with NASCET-based stenosis levels, using the diameter of the distal internal carotid lumen as the denominator for stenosis measurement. The following velocity measurements were obtained: RIGHT ICA: 158 cm/sec CCA: 93 cm/sec SYSTOLIC ICA/CCA RATIO:  1.5 ECA: 52 cm/sec LEFT ICA: 195 cm/sec CCA: 82 cm/sec SYSTOLIC ICA/CCA RATIO:  2.3 ECA: 190 cm/sec RIGHT CAROTID ARTERY: Mild atherosclerosis at the carotid bifurcation. Normal waveform. RIGHT VERTEBRAL ARTERY:  Antegrade flow with normal waveform LEFT CAROTID ARTERY: Moderate atherosclerosis, predominantly affecting the ICA origin, but also narrowing the proximal ECA. LEFT VERTEBRAL ARTERY:  Antegrade flow with normal waveform IMPRESSION: 1. 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 2. Antegrade flow in the vertebral arteries. Electronically Signed   By: Deatra Robinson M.D.   On: 10/14/2020 23:00   ECHOCARDIOGRAM COMPLETE  Result Date: 10/15/2020    ECHOCARDIOGRAM REPORT   Patient Name:   DASHANA GUIZAR Mink Date of Exam: 10/15/2020 Medical Rec #:  [id number]      Height:       65.0 in Accession #:    [id number]     Weight:       187.4 lb Date of Birth:  01-Jul-1945     BSA:          1.924 m Patient Age:    74 years        BP:           121/49 mmHg Patient Gender: F              HR:           76 bpm. Exam Location:  [facility] Procedure: 2D Echo Indications:     Syncope  History:         Patient has prior history of Echocardiogram examinations. TIA;                  Risk Factors:Hypertension.  Sonographer:     L Thornton-Maynard Referring Phys:  [id number] VOJJ K PATEL Diagnosing Phys: Adrian Blackwater MD IMPRESSIONS  1. Left ventricular ejection fraction, by estimation, is 60 to 65%. The left ventricle has normal function. The left ventricle has no regional wall motion abnormalities. Left ventricular diastolic parameters are consistent with Grade I diastolic dysfunction (impaired relaxation).  2. Right ventricular systolic function is normal. The right ventricular size is normal. There is normal pulmonary artery systolic pressure.  3. The mitral valve is normal in structure. Mild mitral valve regurgitation. No evidence of mitral stenosis.  4. The aortic valve is normal in structure. Aortic valve regurgitation is not visualized. No aortic stenosis is present.  5. The inferior vena cava is normal  in size with greater than 50% respiratory variability, suggesting right atrial pressure of 3 mmHg. FINDINGS  Left Ventricle: Left ventricular ejection fraction, by estimation, is 60 to 65%. The left ventricle has normal function. The left ventricle has no regional wall motion abnormalities. The left ventricular internal cavity size was normal in size. There is  no left ventricular hypertrophy. Left ventricular diastolic parameters are consistent with Grade I diastolic dysfunction (impaired relaxation). Right Ventricle: The right ventricular size is normal. No increase in right ventricular wall thickness. Right ventricular systolic function is normal. There is normal pulmonary artery systolic pressure. The tricuspid regurgitant velocity is 2.69 m/s, and  with an assumed right atrial pressure of 3 mmHg, the estimated right ventricular systolic pressure is  31.9 mmHg. Left Atrium: Left atrial size was normal in size. Right Atrium: Right atrial size was normal in size. Pericardium: There is no evidence of pericardial effusion. Mitral Valve: The mitral valve is normal in structure. Mild mitral valve regurgitation. No evidence of mitral valve stenosis. Tricuspid Valve: The tricuspid valve is normal in structure. Tricuspid valve regurgitation is mild . No evidence of tricuspid stenosis. Aortic Valve: The aortic valve is normal in structure. Aortic valve regurgitation is not visualized. No aortic stenosis is present. Aortic valve mean gradient measures 4.0 mmHg. Aortic valve peak gradient measures 7.1 mmHg. Aortic valve area, by VTI measures 2.54 cm. Pulmonic Valve: The pulmonic valve was normal in structure. Pulmonic valve regurgitation is not visualized. No evidence of pulmonic stenosis. Aorta: The aortic root is normal in size and structure. Venous: The inferior vena cava is normal in size with greater than 50% respiratory variability, suggesting right atrial pressure of 3 mmHg. IAS/Shunts: No atrial level shunt detected by color flow Doppler.  LEFT VENTRICLE PLAX 2D LVIDd:         4.68 cm  Diastology LVIDs:         2.97 cm  LV e' medial:    8.49 cm/s LV PW:         1.01 cm  LV E/e' medial:  11.2 LV IVS:        1.08 cm  LV e' lateral:   8.49 cm/s LVOT diam:     2.10 cm  LV E/e' lateral: 11.2 LV SV:         81 LV SV Index:   42 LVOT Area:     3.46 cm  RIGHT VENTRICLE RV S prime:     11.70 cm/s TAPSE (M-mode): 2.8 cm LEFT ATRIUM             Index       RIGHT ATRIUM           Index LA diam:        3.10 cm 1.61 cm/m  RA Area:     14.30 cm LA Vol (A2C):   62.8 ml 32.64 ml/m RA Volume:   33.40 ml  17.36 ml/m LA Vol (A4C):   63.4 ml 32.95 ml/m LA Biplane Vol: 63.0 ml 32.74 ml/m  AORTIC VALVE                   PULMONIC VALVE AV Area (Vmax):    2.33 cm    PV Vmax:       0.96 m/s AV Area (Vmean):   2.49 cm    PV Peak grad:  3.7 mmHg AV Area (VTI):     2.54 cm AV Vmax:  133.00 cm/s AV Vmean:          94.000 cm/s AV VTI:            0.321 m AV Peak Grad:      7.1 mmHg AV Mean Grad:      4.0 mmHg LVOT Vmax:         89.40 cm/s LVOT Vmean:        67.700 cm/s LVOT VTI:          0.235 m LVOT/AV VTI ratio: 0.73  AORTA Ao Root diam: 2.80 cm MITRAL VALVE               TRICUSPID VALVE MV Area (PHT): 3.73 cm    TR Peak grad:   28.9 mmHg MV E velocity: 94.70 cm/s  TR Vmax:        269.00 cm/s MV A velocity: 96.00 cm/s MV E/A ratio:  0.99        SHUNTS                            Systemic VTI:  0.24 m                            Systemic Diam: 2.10 cm Adrian Blackwater MD Electronically signed by Adrian Blackwater MD Signature Date/Time: 10/15/2020/9:46:41 AM    Final      ASSESSMENT AND PLAN: Mildly elevated troponin with peak troponin over 400 with no chest pain in the setting of heatstroke and sinus tachycardia may be due to demand ischemia.  Echocardiogram shows normal wall motion with normal ejection fraction.  Patient does have guaiac positive stools so there is no indication at this time to start heparin.  Will do outpatient work-up.  We will follow the patient closely with you and get outpatient work-up in the office such as CTA coronaries.  Riyansh Gerstner A

## 2020-10-15 NOTE — ED Notes (Signed)
Dr. Allena Katz made aware of the patients repeat trop level.

## 2020-10-15 NOTE — ED Notes (Signed)
Dr. Para March notified about the patients repeat trop level.

## 2020-10-15 NOTE — ED Notes (Signed)
Pharm contacted about verifying meds.

## 2020-10-16 ENCOUNTER — Other Ambulatory Visit: Payer: Self-pay

## 2020-10-16 ENCOUNTER — Other Ambulatory Visit (INDEPENDENT_AMBULATORY_CARE_PROVIDER_SITE_OTHER): Payer: Self-pay | Admitting: Vascular Surgery

## 2020-10-16 ENCOUNTER — Encounter
Admission: EM | Disposition: A | Discharge status: Home Health | Payer: Self-pay | Source: Home / Self Care | Attending: Internal Medicine

## 2020-10-16 DIAGNOSIS — R710 Precipitous drop in hematocrit: Secondary | ICD-10-CM | POA: Insufficient documentation

## 2020-10-16 DIAGNOSIS — I10 Essential (primary) hypertension: Secondary | ICD-10-CM | POA: Diagnosis not present

## 2020-10-16 DIAGNOSIS — R55 Syncope and collapse: Secondary | ICD-10-CM | POA: Diagnosis present

## 2020-10-16 DIAGNOSIS — E538 Deficiency of other specified B group vitamins: Secondary | ICD-10-CM | POA: Insufficient documentation

## 2020-10-16 DIAGNOSIS — I739 Peripheral vascular disease, unspecified: Secondary | ICD-10-CM | POA: Diagnosis not present

## 2020-10-16 DIAGNOSIS — M79604 Pain in right leg: Secondary | ICD-10-CM | POA: Diagnosis not present

## 2020-10-16 DIAGNOSIS — M25561 Pain in right knee: Secondary | ICD-10-CM | POA: Diagnosis not present

## 2020-10-16 DIAGNOSIS — N179 Acute kidney failure, unspecified: Secondary | ICD-10-CM | POA: Diagnosis not present

## 2020-10-16 DIAGNOSIS — I70322 Atherosclerosis of unspecified type of bypass graft(s) of the extremities with rest pain, left leg: Secondary | ICD-10-CM

## 2020-10-16 HISTORY — PX: LOWER EXTREMITY ANGIOGRAPHY: CATH118251

## 2020-10-16 LAB — RESP PANEL BY RT-PCR (FLU A&B, COVID) ARPGX2
Influenza A by PCR: NEGATIVE
Influenza B by PCR: NEGATIVE
SARS Coronavirus 2 by RT PCR: NEGATIVE

## 2020-10-16 LAB — LIPID PANEL
Cholesterol: 89 mg/dL (ref 0–200)
HDL: 40 mg/dL — ABNORMAL LOW (ref >40)
LDL Cholesterol: 41 mg/dL (ref 0–99)
Total CHOL/HDL Ratio: 2.2 RATIO
Triglycerides: 42 mg/dL (ref <150)
VLDL: 8 mg/dL (ref 0–40)

## 2020-10-16 LAB — CBC
HCT: 24.2 % — ABNORMAL LOW (ref 36.0–46.0)
Hemoglobin: 7.9 g/dL — ABNORMAL LOW (ref 12.0–15.0)
MCH: 27.2 pg (ref 26.0–34.0)
MCHC: 32.6 g/dL (ref 30.0–36.0)
MCV: 83.4 fL (ref 80.0–100.0)
Platelets: 362 10*3/uL (ref 150–400)
RBC: 2.9 MIL/uL — ABNORMAL LOW (ref 3.87–5.11)
RDW: 16.2 % — ABNORMAL HIGH (ref 11.5–15.5)
WBC: 8.6 10*3/uL (ref 4.0–10.5)
nRBC: 0 % (ref 0.0–0.2)

## 2020-10-16 LAB — HEMOGLOBIN A1C
Hgb A1c MFr Bld: 5.8 % — ABNORMAL HIGH (ref 4.8–5.6)
Mean Plasma Glucose: 120 mg/dL

## 2020-10-16 LAB — BASIC METABOLIC PANEL
Anion gap: 5 (ref 5–15)
BUN: 11 mg/dL (ref 8–23)
CO2: 21 mmol/L — ABNORMAL LOW (ref 22–32)
Calcium: 8.3 mg/dL — ABNORMAL LOW (ref 8.9–10.3)
Chloride: 107 mmol/L (ref 98–111)
Creatinine, Ser: 1.18 mg/dL — ABNORMAL HIGH (ref 0.44–1.00)
GFR, Estimated: 48 mL/min — ABNORMAL LOW (ref >60)
Glucose, Bld: 87 mg/dL (ref 70–99)
Potassium: 3.5 mmol/L (ref 3.5–5.1)
Sodium: 133 mmol/L — ABNORMAL LOW (ref 135–145)

## 2020-10-16 LAB — HEPARIN LEVEL (UNFRACTIONATED): Heparin Unfractionated: 0.63 IU/mL (ref 0.30–0.70)

## 2020-10-16 SURGERY — LOWER EXTREMITY ANGIOGRAPHY
Anesthesia: Moderate Sedation

## 2020-10-16 MED ORDER — METHYLPREDNISOLONE SODIUM SUCC 125 MG IJ SOLR: 125.0000 mg | Freq: Once | INTRAMUSCULAR | Status: DC | PRN

## 2020-10-16 MED ORDER — PRASUGREL HCL 10 MG PO TABS
10.0000 mg | ORAL_TABLET | Freq: Every day | ORAL | Status: DC
  Administered 2020-10-17: 10 mg via ORAL
  Filled 2020-10-16 (×2): qty 1

## 2020-10-16 MED ORDER — FENTANYL CITRATE (PF) 100 MCG/2ML IJ SOLN
INTRAMUSCULAR | Status: DC | PRN
  Administered 2020-10-16: 50 ug via INTRAVENOUS

## 2020-10-16 MED ORDER — MIDAZOLAM HCL 2 MG/ML PO SYRP
8.0000 mg | ORAL_SOLUTION | Freq: Once | ORAL | Status: DC | PRN
  Filled 2020-10-16: qty 4

## 2020-10-16 MED ORDER — DIPHENHYDRAMINE HCL 50 MG/ML IJ SOLN: 50.0000 mg | Freq: Once | INTRAMUSCULAR | Status: DC | PRN

## 2020-10-16 MED ORDER — ATORVASTATIN CALCIUM 20 MG PO TABS
20.0000 mg | ORAL_TABLET | Freq: Every day | ORAL | Status: DC
  Administered 2020-10-16: 20 mg via ORAL
  Filled 2020-10-16 (×2): qty 1

## 2020-10-16 MED ORDER — MIDAZOLAM HCL 5 MG/5ML IJ SOLN
INTRAMUSCULAR | Status: AC
  Filled 2020-10-16: qty 5

## 2020-10-16 MED ORDER — HYDROMORPHONE HCL 1 MG/ML IJ SOLN
1.0000 mg | Freq: Once | INTRAMUSCULAR | Status: AC | PRN
  Administered 2020-10-16: 1 mg via INTRAVENOUS
  Filled 2020-10-16: qty 1

## 2020-10-16 MED ORDER — FENTANYL CITRATE (PF) 100 MCG/2ML IJ SOLN
INTRAMUSCULAR | Status: AC
  Filled 2020-10-16: qty 2

## 2020-10-16 MED ORDER — CEFAZOLIN SODIUM-DEXTROSE 2-4 GM/100ML-% IV SOLN
2.0000 g | Freq: Once | INTRAVENOUS | Status: DC
  Filled 2020-10-16: qty 100

## 2020-10-16 MED ORDER — MIDAZOLAM HCL 2 MG/2ML IJ SOLN
INTRAMUSCULAR | Status: DC | PRN
  Administered 2020-10-16: 2 mg via INTRAVENOUS

## 2020-10-16 MED ORDER — ATORVASTATIN CALCIUM 20 MG PO TABS
20.0000 mg | ORAL_TABLET | Freq: Every day | ORAL | Status: DC
  Filled 2020-10-16: qty 1

## 2020-10-16 MED ORDER — ONDANSETRON HCL 4 MG/2ML IJ SOLN
4.0000 mg | Freq: Four times a day (QID) | INTRAMUSCULAR | Status: DC | PRN
  Administered 2020-10-16: 4 mg via INTRAVENOUS
  Filled 2020-10-16: qty 2

## 2020-10-16 MED ORDER — SODIUM CHLORIDE 0.9 % IV SOLN: INTRAVENOUS | Status: DC

## 2020-10-16 MED ORDER — HEPARIN SODIUM (PORCINE) 1000 UNIT/ML IJ SOLN
INTRAMUSCULAR | Status: DC | PRN
  Administered 2020-10-16: 5000 [IU] via INTRAVENOUS

## 2020-10-16 MED ORDER — FAMOTIDINE 20 MG PO TABS: 40.0000 mg | ORAL_TABLET | Freq: Once | ORAL | Status: DC | PRN

## 2020-10-16 MED ORDER — IODIXANOL 320 MG/ML IV SOLN
INTRAVENOUS | Status: DC | PRN
  Administered 2020-10-16: 90 mL

## 2020-10-16 SURGICAL SUPPLY — 25 items
BALLN LUTONIX 018 4X100X130 (BALLOONS) ×2
BALLN LUTONIX 018 5X150X130 (BALLOONS) ×2
BALLN LUTONIX 018 5X220X130 (BALLOONS) ×2
BALLN LUTONIX 018 5X300X130 (BALLOONS) ×2
BALLN LUTONIX 018 6X60X130 (BALLOONS) ×2
BALLOON LUTONIX 018 4X100X130 (BALLOONS) IMPLANT
BALLOON LUTONIX 018 5X150X130 (BALLOONS) IMPLANT
BALLOON LUTONIX 018 5X220X130 (BALLOONS) IMPLANT
BALLOON LUTONIX 018 5X300X130 (BALLOONS) IMPLANT
BALLOON LUTONIX 018 6X60X130 (BALLOONS) IMPLANT
CATH ANGIO 5F PIGTAIL 65CM (CATHETERS) ×1 IMPLANT
CATH BEACON 5 .035 65 KMP TIP (CATHETERS) ×1 IMPLANT
CATH VERT 5X100 (CATHETERS) ×1 IMPLANT
COVER PROBE U/S 5X48 (MISCELLANEOUS) ×1 IMPLANT
DEVICE STARCLOSE SE CLOSURE (Vascular Products) ×1 IMPLANT
GLIDEWIRE ADV .035X260CM (WIRE) ×1 IMPLANT
KIT ENCORE 26 ADVANTAGE (KITS) ×1 IMPLANT
PACK ANGIOGRAPHY (CUSTOM PROCEDURE TRAY) ×2 IMPLANT
SHEATH ANL2 6FRX45 HC (SHEATH) ×1 IMPLANT
SHEATH BRITE TIP 5FRX11 (SHEATH) ×1 IMPLANT
STENT VIABAHN 6X150X120 (Permanent Stent) ×1 IMPLANT
SYR MEDRAD MARK 7 150ML (SYRINGE) ×1 IMPLANT
TUBING CONTRAST HIGH PRESS 72 (TUBING) ×1 IMPLANT
WIRE G V18X300CM (WIRE) ×1 IMPLANT
WIRE GUIDERIGHT .035X150 (WIRE) ×2 IMPLANT

## 2020-10-16 NOTE — Progress Notes (Signed)
SUBJECTIVE: No chest pain or shortness of breath.   Vitals:   10/15/20 2330 10/16/20 0200 10/16/20 0500 10/16/20 0630  BP: 131/65 129/63 114/80 132/60  Pulse: 75 82 75 88  Resp: (!) 23 14 18 16   Temp: 98.8 F (37.1 C) 98.9 F (37.2 C) 99 F (37.2 C) 98.7 F (37.1 C)  TempSrc:      SpO2: 90% 96% 99% 96%  Weight:      Height:        Intake/Output Summary (Last 24 hours) at 10/16/2020 0825 Last data filed at 10/16/2020 0707 Gross per 24 hour  Intake 334.17 ml  Output 2200 ml  Net -1865.83 ml    LABS: Basic Metabolic Panel: Recent Labs    10/15/20 0250 10/16/20 0601  NA 135 133*  K 3.6 3.5  CL 107 107  CO2 23 21*  GLUCOSE 100* 87  BUN 15 11  CREATININE 1.24* 1.18*  CALCIUM 8.5* 8.3*   Liver Function Tests: Recent Labs    10/15/20 0250  AST 24  ALT 11  ALKPHOS 86  BILITOT 0.7  PROT 6.3*  ALBUMIN 2.9*   No results for input(s): LIPASE, AMYLASE in the last 72 hours. CBC: Recent Labs    10/14/20 1745 10/15/20 0250 10/16/20 0601  WBC 6.2 10.9* 8.6  NEUTROABS 3.4  --   --   HGB 9.8* 9.1* 7.9*  HCT 29.4* 27.8* 24.2*  MCV 81.4 82.7 83.4  PLT 488* 407* 362   Cardiac Enzymes: Recent Labs    10/14/20 1745  CKTOTAL 65   BNP: Invalid input(s): POCBNP D-Dimer: No results for input(s): DDIMER in the last 72 hours. Hemoglobin A1C: No results for input(s): HGBA1C in the last 72 hours. Fasting Lipid Panel: Recent Labs    10/16/20 0601  CHOL 89  HDL 40*  LDLCALC 41  TRIG 42  CHOLHDL 2.2   Thyroid Function Tests: Recent Labs    10/14/20 2254  TSH 0.777   Anemia Panel: Recent Labs    10/14/20 2254  VITAMINB12 112*  FOLATE 5.4*  FERRITIN 10*  TIBC 381  IRON 37  RETICCTPCT 1.2     PHYSICAL EXAM General: Well developed, well nourished, in no acute distress HEENT:  Normocephalic and atramatic Neck:  No JVD.  Lungs: Clear bilaterally to auscultation and percussion. Heart: HRRR . Normal S1 and S2 without gallops or murmurs.  Abdomen:  Bowel sounds are positive, abdomen soft and non-tender  Msk:  Back normal, normal gait. Normal strength and tone for age. Extremities: No clubbing, cyanosis or edema.   Neuro: Alert and oriented X 3. Psych:  Good affect, responds appropriately  TELEMETRY: Sinus rhythm  ASSESSMENT AND PLAN: Syncope and collapse due to probably heatstroke with elevated troponin up to 400.  Echocardiogram showed normal wall motion and normal ejection fraction.  Cardiac point of view patient can be discharged with follow-up in the office Thursday at 9 AM.  Principal Problem:   Syncope and collapse Active Problems:   Tobacco use disorder   Peripheral vascular disease (HCC)   Iron deficiency anemia due to chronic blood loss   Hypertension, benign   AKI (acute kidney injury) (HCC)   Age-related osteoporosis without current pathological fracture   Esophagitis    Maurianna Benard A, MD, [facility] [place] 10/16/2020 8:25 AM

## 2020-10-16 NOTE — Progress Notes (Signed)
Patient ID: Jody Lowe, female   DOB: 06-Sep-1945, 75 y.o.   MRN: [id number] [facility] PROGRESS NOTE  Jody Lowe PXT:[id number] DOB: [birth year] DOA: 10/14/2020 PCP: Idelia Salm, MD  HPI/Subjective: Patient still complaining of leg pain and knee pain.  No complaints of chest pain or shortness of breath.  With hemoglobin dipping down to 7.9 I stopped heparin drip this morning.  Patient came in with a syncopal episode I am thinking that it could be secondary to pain.  Patient complains of sore throat and requested another COVID test which was negative.  Objective: Vitals:   10/16/20 1223 10/16/20 1330  BP: 133/74 (!) 158/74  Pulse: 87 83  Resp: 16 13  Temp: 98.9 F (37.2 C)   SpO2: 100% 99%    Intake/Output Summary (Last 24 hours) at 10/16/2020 1405 Last data filed at 10/16/2020 0707 Gross per 24 hour  Intake 334.17 ml  Output 2200 ml  Net -1865.83 ml   Filed Weights   10/14/20 1804  Weight: 85 kg    ROS: Review of Systems  Respiratory:  Negative for cough and shortness of breath.   Cardiovascular:  Negative for chest pain.  Gastrointestinal:  Negative for abdominal pain, nausea and vomiting.  Musculoskeletal:  Positive for joint pain.  Exam: Physical Exam HENT:     Head: Normocephalic.     Mouth/Throat:     Pharynx: No oropharyngeal exudate.  Eyes:     General: Lids are normal.     Conjunctiva/sclera: Conjunctivae normal.  Cardiovascular:     Rate and Rhythm: Normal rate and regular rhythm.     Heart sounds: Normal heart sounds, S1 normal and S2 normal.  Pulmonary:     Breath sounds: No decreased breath sounds, wheezing, rhonchi or rales.  Abdominal:     Palpations: Abdomen is soft.     Tenderness: There is no abdominal tenderness.  Musculoskeletal:     Right lower leg: Tenderness present.     Left lower leg: Tenderness present.     Right foot: Tenderness present.     Left foot: Tenderness present.  Skin:    General: Skin is warm.      Findings: No rash.  Neurological:     Mental Status: She is alert and oriented to person, place, and time.     Data Reviewed: Basic Metabolic Panel: Recent Labs  Lab 10/14/20 1745 10/15/20 0250 10/16/20 0601  NA 134* 135 133*  K 3.8 3.6 3.5  CL 106 107 107  CO2 22 23 21*  GLUCOSE 108* 100* 87  BUN 16 15 11   CREATININE 1.45* 1.24* 1.18*  CALCIUM 9.0 8.5* 8.3*   Liver Function Tests: Recent Labs  Lab 10/15/20 0250  AST 24  ALT 11  ALKPHOS 86  BILITOT 0.7  PROT 6.3*  ALBUMIN 2.9*    CBC: Recent Labs  Lab 10/14/20 1745 10/15/20 0250 10/16/20 0601  WBC 6.2 10.9* 8.6  NEUTROABS 3.4  --   --   HGB 9.8* 9.1* 7.9*  HCT 29.4* 27.8* 24.2*  MCV 81.4 82.7 83.4  PLT 488* 407* 362   Cardiac Enzymes: Recent Labs  Lab 10/14/20 1745  CKTOTAL 65     Recent Results (from the past 240 hour(s))  Resp Panel by RT-PCR (Flu A&B, Covid) Nasopharyngeal Swab     Status: None   Collection Time: 10/14/20  5:45 PM   Specimen: Nasopharyngeal Swab; Nasopharyngeal(NP) swabs in vial transport medium  Result Value Ref Range Status   SARS  Coronavirus 2 by RT PCR NEGATIVE NEGATIVE Final    Comment: (NOTE) SARS-CoV-2 target nucleic acids are NOT DETECTED.  The SARS-CoV-2 RNA is generally detectable in upper respiratory specimens during the acute phase of infection. The lowest concentration of SARS-CoV-2 viral copies this assay can detect is 138 copies/mL. A negative result does not preclude SARS-Cov-2 infection and should not be used as the sole basis for treatment or other patient management decisions. A negative result may occur with  improper specimen collection/handling, submission of specimen other than nasopharyngeal swab, presence of viral mutation(s) within the areas targeted by this assay, and inadequate number of viral copies(<138 copies/mL). A negative result must be combined with clinical observations, patient history, and epidemiological information. The expected  result is Negative.  Fact Sheet for Patients:  BloggerCourse.comhttps://www.fda.gov/media/152166/download  Fact Sheet for Healthcare Providers:  SeriousBroker.ithttps://www.fda.gov/media/152162/download  This test is no t yet approved or cleared by the [place] FDA and  has been authorized for detection and/or diagnosis of SARS-CoV-2 by FDA under an Emergency Use Authorization (EUA). This EUA will remain  in effect (meaning this test can be used) for the duration of the COVID-19 declaration under Section 564(b)(1) of the Act, 21 U.S.C.section 360bbb-3(b)(1), unless the authorization is terminated  or revoked sooner.       Influenza A by PCR NEGATIVE NEGATIVE Final   Influenza B by PCR NEGATIVE NEGATIVE Final    Comment: (NOTE) The Xpert Xpress SARS-CoV-2/FLU/RSV plus assay is intended as an aid in the diagnosis of influenza from Nasopharyngeal swab specimens and should not be used as a sole basis for treatment. Nasal washings and aspirates are unacceptable for Xpert Xpress SARS-CoV-2/FLU/RSV testing.  Fact Sheet for Patients: BloggerCourse.comhttps://www.fda.gov/media/152166/download  Fact Sheet for Healthcare Providers: SeriousBroker.ithttps://www.fda.gov/media/152162/download  This test is not yet approved or cleared by the [place] FDA and has been authorized for detection and/or diagnosis of SARS-CoV-2 by FDA under an Emergency Use Authorization (EUA). This EUA will remain in effect (meaning this test can be used) for the duration of the COVID-19 declaration under Section 564(b)(1) of the Act, 21 U.S.C. section 360bbb-3(b)(1), unless the authorization is terminated or revoked.  Performed at [facility], [address] [place], [place] [address]   Blood culture (routine x 2)     Status: None (Preliminary result)   Collection Time: 10/14/20  7:59 PM   Specimen: BLOOD  Result Value Ref Range Status   Specimen Description BLOOD RIGHT ASSIST CONTROL  Final   Special Requests   Final    BOTTLES DRAWN AEROBIC AND  ANAEROBIC Blood Culture results may not be optimal due to an inadequate volume of blood received in culture bottles   Culture   Final    NO GROWTH 2 DAYS Performed at [facility], [address] [place], [place] [address]    Report Status PENDING  Incomplete  Blood culture (routine x 2)     Status: None (Preliminary result)   Collection Time: 10/14/20  8:00 PM   Specimen: BLOOD  Result Value Ref Range Status   Specimen Description BLOOD LEFT ASSIST CONTROL  Final   Special Requests   Final    BOTTLES DRAWN AEROBIC AND ANAEROBIC Blood Culture results may not be optimal due to an inadequate volume of blood received in culture bottles   Culture   Final    NO GROWTH 2 DAYS Performed at [facility], [address] [place], [place] [address]    Report Status PENDING  Incomplete  Resp  Panel by RT-PCR (Flu A&B, Covid) Nasopharyngeal Swab     Status: None   Collection Time: 10/16/20 12:21 PM   Specimen: Nasopharyngeal Swab; Nasopharyngeal(NP) swabs in vial transport medium  Result Value Ref Range Status   SARS Coronavirus 2 by RT PCR NEGATIVE NEGATIVE Final    Comment: (NOTE) SARS-CoV-2 target nucleic acids are NOT DETECTED.  The SARS-CoV-2 RNA is generally detectable in upper respiratory specimens during the acute phase of infection. The lowest concentration of SARS-CoV-2 viral copies this assay can detect is 138 copies/mL. A negative result does not preclude SARS-Cov-2 infection and should not be used as the sole basis for treatment or other patient management decisions. A negative result may occur with  improper specimen collection/handling, submission of specimen other than nasopharyngeal swab, presence of viral mutation(s) within the areas targeted by this assay, and inadequate number of viral copies(<138 copies/mL). A negative result must be combined with clinical observations, patient history, and epidemiological information. The expected result is  Negative.  Fact Sheet for Patients:  BloggerCourse.com  Fact Sheet for Healthcare Providers:  SeriousBroker.it  This test is no t yet approved or cleared by the [place] FDA and  has been authorized for detection and/or diagnosis of SARS-CoV-2 by FDA under an Emergency Use Authorization (EUA). This EUA will remain  in effect (meaning this test can be used) for the duration of the COVID-19 declaration under Section 564(b)(1) of the Act, 21 U.S.C.section 360bbb-3(b)(1), unless the authorization is terminated  or revoked sooner.       Influenza A by PCR NEGATIVE NEGATIVE Final   Influenza B by PCR NEGATIVE NEGATIVE Final    Comment: (NOTE) The Xpert Xpress SARS-CoV-2/FLU/RSV plus assay is intended as an aid in the diagnosis of influenza from Nasopharyngeal swab specimens and should not be used as a sole basis for treatment. Nasal washings and aspirates are unacceptable for Xpert Xpress SARS-CoV-2/FLU/RSV testing.  Fact Sheet for Patients: BloggerCourse.com  Fact Sheet for Healthcare Providers: SeriousBroker.it  This test is not yet approved or cleared by the [place] FDA and has been authorized for detection and/or diagnosis of SARS-CoV-2 by FDA under an Emergency Use Authorization (EUA). This EUA will remain in effect (meaning this test can be used) for the duration of the COVID-19 declaration under Section 564(b)(1) of the Act, 21 U.S.C. section 360bbb-3(b)(1), unless the authorization is terminated or revoked.  Performed at [facility], [address] [place], [place] [address]      Studies: DG Knee 1-2 Views Left  Result Date: 10/15/2020 CLINICAL DATA:  Chronic knee pain. EXAM: LEFT KNEE - 1-2 VIEW COMPARISON:  None. FINDINGS: Marked loss of joint space noted medial compartment with mild joint space loss laterally. Hypertrophic spurring is noted in  all 3 compartments. Probable tiny joint effusion. No worrisome lytic or sclerotic osseous abnormality. IMPRESSION: Tricompartmental degenerative changes without acute bony findings. Electronically Signed   By: Kennith Center M.D.   On: 10/15/2020 08:52   DG Knee 1-2 Views Right  Result Date: 10/15/2020 CLINICAL DATA:  Chronic knee pain. EXAM: RIGHT KNEE - 1-2 VIEW COMPARISON:  None. FINDINGS: Two-view exam shows no evidence for an acute fracture. Loss of joint space noted in both the medial and lateral compartments, medial compartment more affected. Hypertrophic spurring in all 3 compartments is moderate. Possible small joint effusion. Vascular stent device noted lower thigh. IMPRESSION: 1. Tricompartmental degenerative changes without acute bony abnormality. 2. Possible small joint effusion. Electronically Signed   By: Kennith Center  M.D.   On: 10/15/2020 08:51   CT Head Wo Contrast  Result Date: 10/14/2020 CLINICAL DATA:  Fall with head and neck pain. EXAM: CT HEAD WITHOUT CONTRAST CT CERVICAL SPINE WITHOUT CONTRAST TECHNIQUE: Multidetector CT imaging of the head and cervical spine was performed following the standard protocol without intravenous contrast. Multiplanar CT image reconstructions of the cervical spine were also generated. COMPARISON:  CT head dated 09/20/2019. FINDINGS: CT HEAD FINDINGS Brain: No evidence of acute infarction, hemorrhage, hydrocephalus, extra-axial collection or mass lesion/mass effect. Periventricular white matter hypoattenuation likely represents chronic small vessel ischemic disease. Vascular: There are vascular calcifications in the carotid siphons. Skull: Normal. Negative for fracture or focal lesion. Sinuses/Orbits: No acute finding. Other: There is soft tissue swelling overlying the right forehead and frontal bone. CT CERVICAL SPINE FINDINGS Alignment: Normal. Skull base and vertebrae: No acute fracture. No primary bone lesion or focal pathologic process. Soft tissues and  spinal canal: No prevertebral fluid or swelling. No visible canal hematoma. Disc levels: Up to moderate to severe multilevel degenerative disc and joint disease. Upper chest: Negative. Other: None. IMPRESSION: 1. No acute intracranial process. 2. No acute osseous injury in the cervical spine. Electronically Signed   By: Romona Curls M.D.   On: 10/14/2020 19:24   CT Cervical Spine Wo Contrast  Result Date: 10/14/2020 CLINICAL DATA:  Fall with head and neck pain. EXAM: CT HEAD WITHOUT CONTRAST CT CERVICAL SPINE WITHOUT CONTRAST TECHNIQUE: Multidetector CT imaging of the head and cervical spine was performed following the standard protocol without intravenous contrast. Multiplanar CT image reconstructions of the cervical spine were also generated. COMPARISON:  CT head dated 09/20/2019. FINDINGS: CT HEAD FINDINGS Brain: No evidence of acute infarction, hemorrhage, hydrocephalus, extra-axial collection or mass lesion/mass effect. Periventricular white matter hypoattenuation likely represents chronic small vessel ischemic disease. Vascular: There are vascular calcifications in the carotid siphons. Skull: Normal. Negative for fracture or focal lesion. Sinuses/Orbits: No acute finding. Other: There is soft tissue swelling overlying the right forehead and frontal bone. CT CERVICAL SPINE FINDINGS Alignment: Normal. Skull base and vertebrae: No acute fracture. No primary bone lesion or focal pathologic process. Soft tissues and spinal canal: No prevertebral fluid or swelling. No visible canal hematoma. Disc levels: Up to moderate to severe multilevel degenerative disc and joint disease. Upper chest: Negative. Other: None. IMPRESSION: 1. No acute intracranial process. 2. No acute osseous injury in the cervical spine. Electronically Signed   By: Romona Curls M.D.   On: 10/14/2020 19:24   MR BRAIN WO CONTRAST  Result Date: 10/15/2020 CLINICAL DATA:  Recurrent syncope EXAM: MRI HEAD WITHOUT CONTRAST TECHNIQUE:  Multiplanar, multiecho pulse sequences of the brain and surrounding structures were obtained without intravenous contrast. COMPARISON:  05/01/2019 FINDINGS: Brain: No acute infarct, mass effect or extra-axial collection. No acute or chronic hemorrhage. There is multifocal hyperintense T2-weighted signal within the white matter. Parenchymal volume and CSF spaces are normal. The midline structures are normal. Vascular: Major flow voids are preserved. Skull and upper cervical spine: Right frontal scalp hematoma Sinuses/Orbits:No paranasal sinus fluid levels or advanced mucosal thickening. No mastoid or middle ear effusion. Normal orbits. IMPRESSION: 1. No acute intracranial abnormality. 2. Findings of mild chronic small vessel disease. 3. Right frontal scalp hematoma. Electronically Signed   By: Deatra Robinson M.D.   On: 10/15/2020 00:01   US Carotid Bilateral  Result Date: 10/14/2020 CLINICAL DATA:  Syncope EXAM: BILATERAL CAROTID DUPLEX ULTRASOUND TECHNIQUE: Wallace Cullens scale imaging, color Doppler and duplex ultrasound were performed of  bilateral carotid and vertebral arteries in the neck. COMPARISON:  05/01/2019 FINDINGS: Criteria: Quantification of carotid stenosis is based on velocity parameters that correlate the residual internal carotid diameter with NASCET-based stenosis levels, using the diameter of the distal internal carotid lumen as the denominator for stenosis measurement. The following velocity measurements were obtained: RIGHT ICA: 158 cm/sec CCA: 93 cm/sec SYSTOLIC ICA/CCA RATIO:  1.5 ECA: 52 cm/sec LEFT ICA: 195 cm/sec CCA: 82 cm/sec SYSTOLIC ICA/CCA RATIO:  2.3 ECA: 190 cm/sec RIGHT CAROTID ARTERY: Mild atherosclerosis at the carotid bifurcation. Normal waveform. RIGHT VERTEBRAL ARTERY:  Antegrade flow with normal waveform LEFT CAROTID ARTERY: Moderate atherosclerosis, predominantly affecting the ICA origin, but also narrowing the proximal ECA. LEFT VERTEBRAL ARTERY:  Antegrade flow with normal waveform  IMPRESSION: 1. 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 2. Antegrade flow in the vertebral arteries. Electronically Signed   By: Deatra Robinson M.D.   On: 10/14/2020 23:00   ECHOCARDIOGRAM COMPLETE  Result Date: 10/15/2020    ECHOCARDIOGRAM REPORT   Patient Name:   Jody Lowe Date of Exam: 10/15/2020 Medical Rec #:  [id number]      Height:       65.0 in Accession #:    [id number]     Weight:       187.4 lb Date of Birth:  28-Aug-1945     BSA:          1.924 m Patient Age:    74 years       BP:           121/49 mmHg Patient Gender: F              HR:           76 bpm. Exam Location:  [facility] Procedure: 2D Echo Indications:     Syncope  History:         Patient has prior history of Echocardiogram examinations. TIA;                  Risk Factors:Hypertension.  Sonographer:     L Thornton-Maynard Referring Phys:  [id number] T PATEL Diagnosing Phys: Adrian Blackwater MD IMPRESSIONS  1. Left ventricular ejection fraction, by estimation, is 60 to 65%. The left ventricle has normal function. The left ventricle has no regional wall motion abnormalities. Left ventricular diastolic parameters are consistent with Grade I diastolic dysfunction (impaired relaxation).  2. Right ventricular systolic function is normal. The right ventricular size is normal. There is normal pulmonary artery systolic pressure.  3. The mitral valve is normal in structure. Mild mitral valve regurgitation. No evidence of mitral stenosis.  4. The aortic valve is normal in structure. Aortic valve regurgitation is not visualized. No aortic stenosis is present.  5. The inferior vena cava is normal in size with greater than 50% respiratory variability, suggesting right atrial pressure of 3 mmHg. FINDINGS  Left Ventricle: Left ventricular ejection fraction, by estimation, is 60 to 65%. The left ventricle has normal function. The left ventricle has no regional wall motion abnormalities. The left  ventricular internal cavity size was normal in size. There is  no left ventricular hypertrophy. Left ventricular diastolic parameters are consistent with Grade I diastolic dysfunction (impaired relaxation). Right Ventricle: The right ventricular size is normal. No increase in right ventricular wall thickness. Right ventricular systolic function is normal. There is normal pulmonary artery systolic pressure. The tricuspid regurgitant velocity is 2.69 m/s, and  with an assumed right atrial  pressure of 3 mmHg, the estimated right ventricular systolic pressure is 31.9 mmHg. Left Atrium: Left atrial size was normal in size. Right Atrium: Right atrial size was normal in size. Pericardium: There is no evidence of pericardial effusion. Mitral Valve: The mitral valve is normal in structure. Mild mitral valve regurgitation. No evidence of mitral valve stenosis. Tricuspid Valve: The tricuspid valve is normal in structure. Tricuspid valve regurgitation is mild . No evidence of tricuspid stenosis. Aortic Valve: The aortic valve is normal in structure. Aortic valve regurgitation is not visualized. No aortic stenosis is present. Aortic valve mean gradient measures 4.0 mmHg. Aortic valve peak gradient measures 7.1 mmHg. Aortic valve area, by VTI measures 2.54 cm. Pulmonic Valve: The pulmonic valve was normal in structure. Pulmonic valve regurgitation is not visualized. No evidence of pulmonic stenosis. Aorta: The aortic root is normal in size and structure. Venous: The inferior vena cava is normal in size with greater than 50% respiratory variability, suggesting right atrial pressure of 3 mmHg. IAS/Shunts: No atrial level shunt detected by color flow Doppler.  LEFT VENTRICLE PLAX 2D LVIDd:         4.68 cm  Diastology LVIDs:         2.97 cm  LV e' medial:    8.49 cm/s LV PW:         1.01 cm  LV E/e' medial:  11.2 LV IVS:        1.08 cm  LV e' lateral:   8.49 cm/s LVOT diam:     2.10 cm  LV E/e' lateral: 11.2 LV SV:         81 LV SV  Index:   42 LVOT Area:     3.46 cm  RIGHT VENTRICLE RV S prime:     11.70 cm/s TAPSE (M-mode): 2.8 cm LEFT ATRIUM             Index       RIGHT ATRIUM           Index LA diam:        3.10 cm 1.61 cm/m  RA Area:     14.30 cm LA Vol (A2C):   62.8 ml 32.64 ml/m RA Volume:   33.40 ml  17.36 ml/m LA Vol (A4C):   63.4 ml 32.95 ml/m LA Biplane Vol: 63.0 ml 32.74 ml/m  AORTIC VALVE                   PULMONIC VALVE AV Area (Vmax):    2.33 cm    PV Vmax:       0.96 m/s AV Area (Vmean):   2.49 cm    PV Peak grad:  3.7 mmHg AV Area (VTI):     2.54 cm AV Vmax:           133.00 cm/s AV Vmean:          94.000 cm/s AV VTI:            0.321 m AV Peak Grad:      7.1 mmHg AV Mean Grad:      4.0 mmHg LVOT Vmax:         89.40 cm/s LVOT Vmean:        67.700 cm/s LVOT VTI:          0.235 m LVOT/AV VTI ratio: 0.73  AORTA Ao Root diam: 2.80 cm MITRAL VALVE               TRICUSPID VALVE MV Area (  PHT): 3.73 cm    TR Peak grad:   28.9 mmHg MV E velocity: 94.70 cm/s  TR Vmax:        269.00 cm/s MV A velocity: 96.00 cm/s MV E/A ratio:  0.99        SHUNTS                            Systemic VTI:  0.24 m                            Systemic Diam: 2.10 cm Adrian Blackwater MD Electronically signed by Adrian Blackwater MD Signature Date/Time: 10/15/2020/9:46:41 AM    Final     Scheduled Meds:  amitriptyline  25 mg Oral QHS   aspirin EC  81 mg Oral Daily   colchicine  0.6 mg Oral Daily   cyanocobalamin  1,000 mcg Intramuscular Daily   ferrous sulfate  325 mg Oral QODAY   FLUoxetine  80 mg Oral Daily   fluticasone  1 spray Each Nare Daily   ipratropium  2 spray Each Nare TID   mirabegron ER  25 mg Oral Daily   nicotine  21 mg Transdermal Daily   pantoprazole  40 mg Oral Daily   Continuous Infusions:  sodium chloride 30 mL/hr at 10/16/20 0934   sodium chloride      ceFAZolin (ANCEF) IV      Assessment/Plan:  Syncope and collapse could be secondary to pain.  MRI of the brain negative.  Carotid ultrasound does show some plaque in  the carotids.  I do not want to get a CTA just yet especially if vascular going to do an angiogram of the lower extremities.  Physical therapy evaluation. Severe leg and knee pain.  History of peripheral vascular disease.  X-ray bilateral knees shows tricompartmental arthritis.  Family interested in a wheelchair.  Unable to get an ABI with history of stent.  Consulted vascular and they will try to add on for an angiogram today.  Did add some colchicine just in case this is gout with borderline elevated uric acid. Drop in hemoglobin with iron deficiency anemia and B12 deficiency.  IM B12 shots while here in the hospital.  IV iron yesterday.  Discontinued heparin with drop in hemoglobin.  Check CBC and type and cross tomorrow just in case blood needed. Acute kidney injury with creatinine of 1.45 on presentation and down to 1.18 today. Elevated troponin likely demand ischemia.  On low-dose aspirin.  EF normal on echocardiogram. DC Foley and oxybutynin        Code Status:     Code Status Orders  (From admission, onward)           Start     Ordered   10/14/20 2148  Full code  Continuous        10/14/20 2153           Code Status History     Date Active Date Inactive Code Status Order ID Comments User Context   05/01/2019 1414 05/03/2019 2201 Full Code [id number]  Gertha Calkin, MD ED      Family Communication: Spoke with son on the phone Disposition Plan: Status is: Inpatient  Dispo: The patient is from: Home              Anticipated d/c is to: Home  Patient currently hopefully will have an angiogram of her lower extremities to check out blood supply today.   Difficult to place patient.  No.  Consultants: Cardiology Vascular surgery  Time spent: 27 minutes  Asaf [employer]

## 2020-10-16 NOTE — ED Notes (Signed)
Per Dr. Renae Gloss, pt may have PO meds pending procedure today.

## 2020-10-16 NOTE — Op Note (Signed)
[facility]  Percutaneous Study/Intervention Procedural Note   Date of Surgery: 10/14/2020 - 10/16/2020  Surgeon(s):Jackqueline Aquilar    Assistants:none  Pre-operative Diagnosis: PAD with rest pain bilateral lower extremities  Post-operative diagnosis:  Same  Procedure(s) Performed:             1.  Ultrasound guidance for vascular access right femoral artery             2.  Catheter placement into left common femoral artery from right femoral approach             3.  Aortogram and selective left lower extremity angiogram             4.  Percutaneous transluminal angioplasty of left tibioperoneal trunk and proximal peroneal artery with 4 mm diameter by 10 cm length Lutonix drug-coated angioplasty balloon             5.  Percutaneous transluminal angioplasty of the entire left SFA and above-knee popliteal arteries with 5 mm diameter by 30 cm length and 5 mm diameter by 15 cm length Lutonix drug-coated angioplasty balloon  6.  Viabahn stent placement to the mid to distal SFA and was proximal popliteal artery with 6 mm diameter by 15 cm length stent for residual stenosis of greater than 50% this location  7.  Percutaneous transluminal angioplasty of the origin of the left SFA up to the common femoral artery with 6 mm diameter by 6 cm length Lutonix drug-coated angioplasty balloon             8.  StarClose closure device right femoral artery  EBL: 10 cc  Contrast: 90 cc  Fluoro Time: 8.2 minutes  Moderate Conscious Sedation Time: approximately 60 minutes using 2 mg of Versed and 50 mcg of Fentanyl              Indications:  Patient is a 75 y.o.female with significant pain in both legs worrisome for rest pain and a previous history of PAD with interventions at another institution.  The patient is brought in for angiography for further evaluation and potential treatment.  Due to the limb threatening nature of the situation, angiogram was performed for attempted limb salvage.  The patient is aware that if the procedure fails, amputation would be expected.  The patient also understands that even with successful revascularization, amputation may still be required due to the severity of the situation.  Risks and benefits are discussed and informed consent is obtained.   Procedure:  The patient was identified and appropriate procedural time out was performed.  The patient was then placed supine on the table and prepped and draped in the usual sterile fashion. Moderate conscious sedation was administered during a face to face encounter with the patient throughout the procedure with my supervision of the RN administering medicines and monitoring the patient's vital signs, pulse oximetry, telemetry and mental status throughout from the start of the procedure until the patient was taken to the recovery room. Ultrasound was used to evaluate the right common femoral artery.  It was patent .  A digital ultrasound image was acquired.  A Seldinger needle was used to access the right common femoral artery under direct ultrasound guidance and a permanent image was performed.  A 0.035 J wire was advanced without resistance and a 5Fr sheath was placed.  Pigtail catheter was placed into the aorta and an AP aortogram was performed. This demonstrated that the renal arteries appeared relatively normal other  may have been some degree of disease on the left, although it is not particularly well seen due to motion artifact.  The aorta was calcific but not stenotic as were the iliac artery significantly calcific but only mildly stenotic. I then crossed the aortic bifurcation and advanced to the lower femoral head. Selective left lower extremity angiogram was then performed. This demonstrated a flush occlusion of the left SFA with a large profunda femoris artery which was widely patent as well as a lateral femoral branch providing collateral flow distally.  The reconstitution was in the above-knee popliteal  artery of the popliteal artery remained heavily diseased in the above-knee popliteal artery.  The below-knee popliteal artery normalized, but there was about a 70% stenosis in the tibioperoneal trunk and the proximal portion of the peroneal artery that was relatively short segment and then the peroneal artery was the only runoff distally. It was felt that it was in the patient's best interest to proceed with intervention after these images to avoid a second procedure and a larger amount of contrast and fluoroscopy based off of the findings from the initial angiogram. The patient was systemically heparinized and a 6 French Ansell sheath was then placed over the Terumo Advantage wire. I then used a Kumpe catheter and the advantage wire to get into the SFA occlusion and cross this with minimal difficulty confirming intraluminal flow in the below-knee popliteal artery.  I then exchanged for a V 18 wire and cross the stenosis in the tibioperoneal trunk and proximal peroneal artery parking the wire distally at the ankle.  I then proceeded with treatment.  A 4 mm diameter by 10 cm length Lutonix drug-coated angioplasty balloon was inflated in the proximal peroneal artery, tibioperoneal trunk, distal popliteal artery and inflated to 6 atm for 1 minute.  A 5 mm diameter by 30 cm length Lutonix drug-coated angioplasty balloon was then inflated from the mid popliteal artery up through the mid SFA and a 5 mm diameter by 15 cm length Lutonix drug-coated angioplasty balloon was inflated in the proximal to mid left SFA.  Inflations were anywhere from 6 to 10 atm for 1 minute.  Completion imaging showed the tibioperoneal trunk to have about a 30% residual stenosis, the popliteal artery to have less than 20% residual stenosis, but the mid and distal SFA had greater than 50% stenosis.  I elected to treat this area with a 6 mm diameter by 15 cm length Viabahn stent postdilated with a 5 mm balloon with excellent angiographic completion  result and less than 10% residual stenosis.  Also treated the origin of the SFA with a larger balloon using a 6 mm diameter by 6 cm length Lutonix drug-coated angioplasty balloon inflated to 8 atm for 1 minute.  Completion imaging now showed only about a 20% residual stenosis at the origin of the left SFA. I elected to terminate the procedure. The sheath was removed and StarClose closure device was deployed in the right femoral artery with excellent hemostatic result. The patient was taken to the recovery room in stable condition having tolerated the procedure well.  Findings:               Aortogram: Renal arteries appeared relatively normal other may have been some degree of disease on the left, although it is not particularly well seen due to motion artifact.  The aorta was calcific but not stenotic as were the iliac artery significantly calcific but only mildly stenotic.               Left Lower Extremity:  This demonstrated a flush occlusion of the left SFA with a large profunda femoris artery which was widely patent as well as a lateral femoral branch providing collateral flow distally.  The reconstitution was in the above-knee popliteal artery of the popliteal artery remained heavily diseased in the above-knee popliteal artery.  The below-knee popliteal artery normalized, but there was about a 70% stenosis in the tibioperoneal trunk and the proximal portion of the peroneal artery that was relatively short segment and then the peroneal artery was the only runoff distally.   Disposition: Patient was taken to the recovery room in stable condition having tolerated the procedure well.  Complications: None  Leotis Pain 10/16/2020 4:18 PM   This note was created with Dragon Medical transcription system. Any errors in dictation are purely unintentional.

## 2020-10-16 NOTE — Consult Note (Signed)
[facility] Vascular Consult Note  MRN : [id number]  Jody Lowe is a 74 y.o. (08/09/1945) female who presents with chief complaint of  Chief Complaint  Patient presents with   Heat Exposure   Hyperthermia   History of Present Illness:  Jody Lowe is a 74 year old female brought to the emergency room via EMS after bystander called for patient found on the floor for unknown duration today.    Patient states the last thing she remembers is coming home after grocery shopping and getting out of her car.  She does not recollect the time that was.  This past week patient does describe having vertigo and dizziness and palpitations.  On her last admission in February 2021 patient was admitted for left arm numbness for TIA and had a negative MRI then.  Patient today denies any complaints of chest pain palpitation shortness of breath headache blurred vision speech or gait ataxia, no abdominal or bladder issues.    Patient only reports leg pains and claudication-like symptoms due to her PAD that her vascular doctor knows about at [place] patient also reports that she has a stent in her legs.  Counseled patient on tobacco cessation is ongoing tobacco because a stent closures and limb ischemia and among other complications patient states that she is aware that she smoking can cause state closures and make her blood supply worse.  Chart reviewed and discussed with patient about her anemia patient states that he does recall having ulcer treated by endoscopy by GI at [facility].  Patient does take Fosamax.   Vascular Surgery was consulted for PAD.  Current Facility-Administered Medications  Medication Dose Route Frequency Provider Last Rate Last Admin   0.9 %  sodium chloride infusion   Intravenous Continuous Wieting, Richard, MD 30 mL/hr at 10/16/20 0934 Rate Change at 10/16/20 0934   acetaminophen (TYLENOL) tablet 650 mg  650 mg Oral Q6H PRN Patel, Ekta V, MD   650 mg at 10/15/20  1542   Or   acetaminophen (TYLENOL) suppository 650 mg  650 mg Rectal Q6H PRN Patel, Ekta V, MD       albuterol (PROVENTIL) (2.5 MG/3ML) 0.083% nebulizer solution 3 mL  3 mL Nebulization Q6H PRN Patel, Ekta V, MD       amitriptyline (ELAVIL) tablet 25 mg  25 mg Oral QHS Patel, Ekta V, MD   25 mg at 10/15/20 2139   aspirin EC tablet 81 mg  81 mg Oral Daily Patel, Ekta V, MD   81 mg at 10/16/20 1125   colchicine tablet 0.6 mg  0.6 mg Oral Daily Wieting, Richard, MD   0.6 mg at 10/16/20 1123   cyanocobalamin ((VITAMIN B-12)) injection 1,000 mcg  1,000 mcg Intramuscular Daily Wieting, Richard, MD   1,000 mcg at 10/16/20 1124   ferrous sulfate tablet 325 mg  325 mg Oral QODAY Patel, Ekta V, MD   325 mg at 10/16/20 1125   FLUoxetine (PROZAC) capsule 80 mg  80 mg Oral Daily Patel, Ekta V, MD   80 mg at 10/16/20 1124   fluticasone (FLONASE) 50 MCG/ACT nasal spray 1 spray  1 spray Each Nare Daily Patel, Ekta V, MD   1 spray at 10/16/20 1122   ipratropium (ATROVENT) 0.03 % nasal spray 2 spray  2 spray Each Nare TID Patel, Ekta V, MD   2 spray at 10/16/20 1122   mirabegron ER (MYRBETRIQ) tablet 25 mg  25 mg Oral Daily Patel, Ekta V,   MD   25 mg at 10/16/20 1124   nicotine (NICODERM CQ - dosed in mg/24 hours) patch 21 mg  21 mg Transdermal Daily Patel, Ekta V, MD   21 mg at 10/15/20 0849   pantoprazole (PROTONIX) EC tablet 40 mg  40 mg Oral Daily Patel, Ekta V, MD   40 mg at 10/16/20 1124   Current Outpatient Medications  Medication Sig Dispense Refill   albuterol (VENTOLIN HFA) 108 (90 Base) MCG/ACT inhaler Inhale 1-2 puffs into the lungs every 6 (six) hours as needed for wheezing or shortness of breath.     alendronate (FOSAMAX) 70 MG tablet Take 70 mg by mouth once a week.     amitriptyline (ELAVIL) 25 MG tablet Take 25 mg by mouth at bedtime.     aspirin EC 81 MG tablet Take 81 mg by mouth daily.     Cholecalciferol (D3-1000) 25 MCG (1000 UT) capsule Take 1,000 Units by mouth daily.     diclofenac  Sodium (VOLTAREN) 1 % GEL Apply 2 g topically 4 (four) times daily.     esomeprazole (NEXIUM) 40 MG capsule Take 40 mg by mouth daily.     ferrous sulfate 325 (65 FE) MG tablet Take 325 mg by mouth every other day.     FLUoxetine (PROZAC) 40 MG capsule Take 40 mg by mouth daily. Take one capsule (40 mg) by mouth once daily for one month, then increase to two capsules (80 mg) thereafter     fluticasone (FLONASE) 50 MCG/ACT nasal spray Place 1 spray into both nostrils daily.     ipratropium (ATROVENT) 0.03 % nasal spray Place 2 sprays into both nostrils 3 (three) times daily.     lovastatin (MEVACOR) 40 MG tablet Take 40 mg by mouth daily with supper.     meclizine (ANTIVERT) 25 MG tablet Take 1 tablet (25 mg total) by mouth 3 (three) times daily as needed for dizziness or nausea. 30 tablet 1   nystatin (MYCOSTATIN/NYSTOP) powder Apply 1 application topically 2 (two) times daily.     oxybutynin (DITROPAN-XL) 10 MG 24 hr tablet Take 10 mg by mouth daily.     Oxycodone HCl 10 MG TABS Take 10 mg by mouth every 6 (six) hours as needed (pain).     polyethylene glycol (MIRALAX / GLYCOLAX) 17 g packet Take 17 g by mouth daily as needed for mild constipation.     propranolol (INDERAL) 20 MG tablet Take 20 mg by mouth 2 (two) times daily.     senna-docusate (SENOKOT-S) 8.6-50 MG tablet Take 2 tablets by mouth daily.     Vitamin D, Ergocalciferol, (DRISDOL) 1.25 MG (50000 UNIT) CAPS capsule Take 50,000 Units by mouth once a week. For 8 doses     mirabegron ER (MYRBETRIQ) 25 MG TB24 tablet Take 25 mg by mouth daily. (Patient not taking: Reported on 10/14/2020)     naloxone (NARCAN) nasal spray 4 mg/0.1 mL Place 1 spray into the nose once. (Patient not taking: Reported on 10/14/2020)     varenicline (CHANTIX) 0.5 MG tablet Take 0.5 mg by mouth 2 (two) times daily. (Patient not taking: No sig reported)     Past Medical History:  Diagnosis Date   Anemia    Hypertension    Peripheral artery disease (HCC)     Renal disorder    Past Surgical History:  Procedure Laterality Date   BREAST REDUCTION SURGERY     CATARACT EXTRACTION W/PHACO Left 11/11/2019   Procedure: CATARACT EXTRACTION PHACO AND   INTRAOCULAR LENS PLACEMENT (IOC) LEFT VISION BLUE;  Surgeon: Harrow, Brian, MD;  Location: [facility];  Service: Ophthalmology;  Laterality: Left;  7.94 0:42.4   CATARACT EXTRACTION W/PHACO Right 12/23/2019   Procedure: CATARACT EXTRACTION PHACO AND INTRAOCULAR LENS PLACEMENT (IOC) RIGHT VISION BLUE 5.47  00:31.1;  Surgeon: Harrow, Brian, MD;  Location: [facility];  Service: Ophthalmology;  Laterality: Right;   Social History Social History   Tobacco Use   Smoking status: Every Day    Packs/day: 0.50    Types: Cigarettes   Smokeless tobacco: Never  Substance Use Topics   Alcohol use: No   Drug use: No   Family History History reviewed. No pertinent family history. Denies family history of PAD, Venous Disease or Renal Disease.  Allergies  Allergen Reactions   Gabapentin Other (See Comments)    Nervous, shaky   Plavix [Clopidogrel Bisulfate] Rash   REVIEW OF SYSTEMS (Negative unless checked)  Constitutional: []Weight loss  []Fever  []Chills Cardiac: []Chest pain   []Chest pressure   []Palpitations   []Shortness of breath when laying flat   []Shortness of breath at rest   []Shortness of breath with exertion. Vascular:  [x]Pain in legs with walking   [x]Pain in legs at rest   []Pain in legs when laying flat   []Claudication   [x]Pain in feet when walking  [x]Pain in feet at rest  []Pain in feet when laying flat   []History of DVT   []Phlebitis   []Swelling in legs   []Varicose veins   []Non-healing ulcers Pulmonary:   []Uses home oxygen   []Productive cough   []Hemoptysis   []Wheeze  []COPD   []Asthma Neurologic:  []Dizziness  []Blackouts   []Seizures   []History of stroke   []History of TIA  []Aphasia   []Temporary blindness   []Dysphagia   []Weakness or numbness in arms    []Weakness or numbness in legs Musculoskeletal:  []Arthritis   []Joint swelling   []Joint pain   []Low back pain Hematologic:  []Easy bruising  []Easy bleeding   []Hypercoagulable state   [x]Anemic  []Hepatitis Gastrointestinal:  []Blood in stool   []Vomiting blood  []Gastroesophageal reflux/heartburn   []Difficulty swallowing. Genitourinary:  []Chronic kidney disease   []Difficult urination  []Frequent urination  []Burning with urination   []Blood in urine Skin:  []Rashes   []Ulcers   []Wounds Psychological:  []History of anxiety   [] History of major depression.  Physical Examination  Vitals:   10/16/20 0630 10/16/20 0930 10/16/20 1030 10/16/20 1223  BP: 132/60 (!) 133/58 133/86 133/74  Pulse: 88 83 89 87  Resp: 16 14  16  Temp: 98.7 F (37.1 C) 98.5 F (36.9 C) 98.3 F (36.8 C) 98.9 F (37.2 C)  TempSrc:      SpO2: 96% 96% 98% 100%  Weight:      Height:       Body mass index is 31.18 kg/m. Gen:  WD/WN, NAD Head: [place]/AT, No temporalis wasting. Prominent temp pulse not noted. Ear/Nose/Throat: Hearing grossly intact, nares w/o erythema or drainage, oropharynx w/o Erythema/Exudate Eyes: Sclera non-icteric, conjunctiva clear Neck: Trachea midline.  No JVD.  Pulmonary:  Good air movement, respirations not labored, equal bilaterally.  Cardiac: RRR, normal S1, S2. Vascular:  Vessel Right Left  Radial Palpable Palpable  Ulnar Palpable Palpable  Brachial Palpable Palpable  Carotid Palpable, without bruit Palpable, without bruit  Aorta Not palpable N/A  Femoral Palpable Palpable  Popliteal Palpable Palpable  PT Non-Palpable Non-Palpable    DP Non-Palpable Non-Palpable   Gastrointestinal: soft, non-tender/non-distended. No guarding/reflex.  Musculoskeletal: M/S 5/5 throughout.  Extremities without ischemic changes.  No deformity or atrophy. No edema. Neurologic: Sensation grossly intact in extremities.  Symmetrical.  Speech is fluent. Motor exam as listed above. Psychiatric:  Judgment intact, Mood & affect appropriate for pt's clinical situation. Dermatologic: No rashes or ulcers noted.  No cellulitis or open wounds. Lymph : No Cervical, Axillary, or Inguinal lymphadenopathy.  CBC Lab Results  Component Value Date   WBC 8.6 10/16/2020   HGB 7.9 (L) 10/16/2020   HCT 24.2 (L) 10/16/2020   MCV 83.4 10/16/2020   PLT 362 10/16/2020   BMET    Component Value Date/Time   NA 133 (L) 10/16/2020 0601   K 3.5 10/16/2020 0601   CL 107 10/16/2020 0601   CO2 21 (L) 10/16/2020 0601   GLUCOSE 87 10/16/2020 0601   BUN 11 10/16/2020 0601   CREATININE 1.18 (H) 10/16/2020 0601   CALCIUM 8.3 (L) 10/16/2020 0601   GFRNONAA 48 (L) 10/16/2020 0601   GFRAA >60 09/20/2019 1312   Estimated Creatinine Clearance: 43.7 mL/min (A) (by C-G formula based on SCr of 1.18 mg/dL (H)).  COAG Lab Results  Component Value Date   INR 1.2 10/14/2020   INR 1.1 05/01/2019   Radiology DG Knee 1-2 Views Left  Result Date: 10/15/2020 CLINICAL DATA:  Chronic knee pain. EXAM: LEFT KNEE - 1-2 VIEW COMPARISON:  None. FINDINGS: Marked loss of joint space noted medial compartment with mild joint space loss laterally. Hypertrophic spurring is noted in all 3 compartments. Probable tiny joint effusion. No worrisome lytic or sclerotic osseous abnormality. IMPRESSION: Tricompartmental degenerative changes without acute bony findings. Electronically Signed   By: Eric  Mansell M.D.   On: 10/15/2020 08:52   DG Knee 1-2 Views Right  Result Date: 10/15/2020 CLINICAL DATA:  Chronic knee pain. EXAM: RIGHT KNEE - 1-2 VIEW COMPARISON:  None. FINDINGS: Two-view exam shows no evidence for an acute fracture. Loss of joint space noted in both the medial and lateral compartments, medial compartment more affected. Hypertrophic spurring in all 3 compartments is moderate. Possible small joint effusion. Vascular stent device noted lower thigh. IMPRESSION: 1. Tricompartmental degenerative changes without acute bony  abnormality. 2. Possible small joint effusion. Electronically Signed   By: Eric  Mansell M.D.   On: 10/15/2020 08:51   CT Head Wo Contrast  Result Date: 10/14/2020 CLINICAL DATA:  Fall with head and neck pain. EXAM: CT HEAD WITHOUT CONTRAST CT CERVICAL SPINE WITHOUT CONTRAST TECHNIQUE: Multidetector CT imaging of the head and cervical spine was performed following the standard protocol without intravenous contrast. Multiplanar CT image reconstructions of the cervical spine were also generated. COMPARISON:  CT head dated 09/20/2019. FINDINGS: CT HEAD FINDINGS Brain: No evidence of acute infarction, hemorrhage, hydrocephalus, extra-axial collection or mass lesion/mass effect. Periventricular white matter hypoattenuation likely represents chronic small vessel ischemic disease. Vascular: There are vascular calcifications in the carotid siphons. Skull: Normal. Negative for fracture or focal lesion. Sinuses/Orbits: No acute finding. Other: There is soft tissue swelling overlying the right forehead and frontal bone. CT CERVICAL SPINE FINDINGS Alignment: Normal. Skull base and vertebrae: No acute fracture. No primary bone lesion or focal pathologic process. Soft tissues and spinal canal: No prevertebral fluid or swelling. No visible canal hematoma. Disc levels: Up to moderate to severe multilevel degenerative disc and joint disease. Upper chest: Negative. Other: None. IMPRESSION: 1. No acute intracranial process. 2. No acute osseous injury in the cervical spine. Electronically   Signed   By: Tyler  Litton M.D.   On: 10/14/2020 19:24   CT Cervical Spine Wo Contrast  Result Date: 10/14/2020 CLINICAL DATA:  Fall with head and neck pain. EXAM: CT HEAD WITHOUT CONTRAST CT CERVICAL SPINE WITHOUT CONTRAST TECHNIQUE: Multidetector CT imaging of the head and cervical spine was performed following the standard protocol without intravenous contrast. Multiplanar CT image reconstructions of the cervical spine were also generated.  COMPARISON:  CT head dated 09/20/2019. FINDINGS: CT HEAD FINDINGS Brain: No evidence of acute infarction, hemorrhage, hydrocephalus, extra-axial collection or mass lesion/mass effect. Periventricular white matter hypoattenuation likely represents chronic small vessel ischemic disease. Vascular: There are vascular calcifications in the carotid siphons. Skull: Normal. Negative for fracture or focal lesion. Sinuses/Orbits: No acute finding. Other: There is soft tissue swelling overlying the right forehead and frontal bone. CT CERVICAL SPINE FINDINGS Alignment: Normal. Skull base and vertebrae: No acute fracture. No primary bone lesion or focal pathologic process. Soft tissues and spinal canal: No prevertebral fluid or swelling. No visible canal hematoma. Disc levels: Up to moderate to severe multilevel degenerative disc and joint disease. Upper chest: Negative. Other: None. IMPRESSION: 1. No acute intracranial process. 2. No acute osseous injury in the cervical spine. Electronically Signed   By: Tyler  Litton M.D.   On: 10/14/2020 19:24   MR BRAIN WO CONTRAST  Result Date: 10/15/2020 CLINICAL DATA:  Recurrent syncope EXAM: MRI HEAD WITHOUT CONTRAST TECHNIQUE: Multiplanar, multiecho pulse sequences of the brain and surrounding structures were obtained without intravenous contrast. COMPARISON:  05/01/2019 FINDINGS: Brain: No acute infarct, mass effect or extra-axial collection. No acute or chronic hemorrhage. There is multifocal hyperintense T2-weighted signal within the white matter. Parenchymal volume and CSF spaces are normal. The midline structures are normal. Vascular: Major flow voids are preserved. Skull and upper cervical spine: Right frontal scalp hematoma Sinuses/Orbits:No paranasal sinus fluid levels or advanced mucosal thickening. No mastoid or middle ear effusion. Normal orbits. IMPRESSION: 1. No acute intracranial abnormality. 2. Findings of mild chronic small vessel disease. 3. Right frontal scalp  hematoma. Electronically Signed   By: Kevin  Herman M.D.   On: 10/15/2020 00:01   US Carotid Bilateral  Result Date: 10/14/2020 CLINICAL DATA:  Syncope EXAM: BILATERAL CAROTID DUPLEX ULTRASOUND TECHNIQUE: Gray scale imaging, color Doppler and duplex ultrasound were performed of bilateral carotid and vertebral arteries in the neck. COMPARISON:  05/01/2019 FINDINGS: Criteria: Quantification of carotid stenosis is based on velocity parameters that correlate the residual internal carotid diameter with NASCET-based stenosis levels, using the diameter of the distal internal carotid lumen as the denominator for stenosis measurement. The following velocity measurements were obtained: RIGHT ICA: 158 cm/sec CCA: 93 cm/sec SYSTOLIC ICA/CCA RATIO:  1.5 ECA: 52 cm/sec LEFT ICA: 195 cm/sec CCA: 82 cm/sec SYSTOLIC ICA/CCA RATIO:  2.3 ECA: 190 cm/sec RIGHT CAROTID ARTERY: Mild atherosclerosis at the carotid bifurcation. Normal waveform. RIGHT VERTEBRAL ARTERY:  Antegrade flow with normal waveform LEFT CAROTID ARTERY: Moderate atherosclerosis, predominantly affecting the ICA origin, but also narrowing the proximal ECA. LEFT VERTEBRAL ARTERY:  Antegrade flow with normal waveform IMPRESSION: 1. 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 2. Antegrade flow in the vertebral arteries. Electronically Signed   By: Kevin  Herman M.D.   On: 10/14/2020 23:00   ECHOCARDIOGRAM COMPLETE  Result Date: 10/15/2020    ECHOCARDIOGRAM REPORT   Patient Name:   Jody Lowe Date of Exam: 10/15/2020 Medical Rec #:  [id number]      Height:         65.0 in Accession #:    [id number]     Weight:       187.4 lb Date of Birth:  10/13/1945     BSA:          1.924 m Patient Age:    74 years       BP:           121/49 mmHg Patient Gender: F              HR:           76 bpm. Exam Location:  [facility] Procedure: 2D Echo Indications:     Syncope  History:         Patient has prior history of Echocardiogram  examinations. TIA;                  Risk Factors:Hypertension.  Sonographer:     L Thornton-Maynard Referring Phys:  [id number] EKTA V PATEL Diagnosing Phys: Shaukat Khan MD IMPRESSIONS  1. Left ventricular ejection fraction, by estimation, is 60 to 65%. The left ventricle has normal function. The left ventricle has no regional wall motion abnormalities. Left ventricular diastolic parameters are consistent with Grade I diastolic dysfunction (impaired relaxation).  2. Right ventricular systolic function is normal. The right ventricular size is normal. There is normal pulmonary artery systolic pressure.  3. The mitral valve is normal in structure. Mild mitral valve regurgitation. No evidence of mitral stenosis.  4. The aortic valve is normal in structure. Aortic valve regurgitation is not visualized. No aortic stenosis is present.  5. The inferior vena cava is normal in size with greater than 50% respiratory variability, suggesting right atrial pressure of 3 mmHg. FINDINGS  Left Ventricle: Left ventricular ejection fraction, by estimation, is 60 to 65%. The left ventricle has normal function. The left ventricle has no regional wall motion abnormalities. The left ventricular internal cavity size was normal in size. There is  no left ventricular hypertrophy. Left ventricular diastolic parameters are consistent with Grade I diastolic dysfunction (impaired relaxation). Right Ventricle: The right ventricular size is normal. No increase in right ventricular wall thickness. Right ventricular systolic function is normal. There is normal pulmonary artery systolic pressure. The tricuspid regurgitant velocity is 2.69 m/s, and  with an assumed right atrial pressure of 3 mmHg, the estimated right ventricular systolic pressure is 31.9 mmHg. Left Atrium: Left atrial size was normal in size. Right Atrium: Right atrial size was normal in size. Pericardium: There is no evidence of pericardial effusion. Mitral Valve: The mitral valve is  normal in structure. Mild mitral valve regurgitation. No evidence of mitral valve stenosis. Tricuspid Valve: The tricuspid valve is normal in structure. Tricuspid valve regurgitation is mild . No evidence of tricuspid stenosis. Aortic Valve: The aortic valve is normal in structure. Aortic valve regurgitation is not visualized. No aortic stenosis is present. Aortic valve mean gradient measures 4.0 mmHg. Aortic valve peak gradient measures 7.1 mmHg. Aortic valve area, by VTI measures 2.54 cm. Pulmonic Valve: The pulmonic valve was normal in structure. Pulmonic valve regurgitation is not visualized. No evidence of pulmonic stenosis. Aorta: The aortic root is normal in size and structure. Venous: The inferior vena cava is normal in size with greater than 50% respiratory variability, suggesting right atrial pressure of 3 mmHg. IAS/Shunts: No atrial level shunt detected by color flow Doppler.  LEFT VENTRICLE PLAX 2D LVIDd:         4.68 cm  Diastology LVIDs:           2.97 cm  LV e' medial:    8.49 cm/s LV PW:         1.01 cm  LV E/e' medial:  11.2 LV IVS:        1.08 cm  LV e' lateral:   8.49 cm/s LVOT diam:     2.10 cm  LV E/e' lateral: 11.2 LV SV:         81 LV SV Index:   42 LVOT Area:     3.46 cm  RIGHT VENTRICLE RV S prime:     11.70 cm/s TAPSE (M-mode): 2.8 cm LEFT ATRIUM             Index       RIGHT ATRIUM           Index LA diam:        3.10 cm 1.61 cm/m  RA Area:     14.30 cm LA Vol (A2C):   62.8 ml 32.64 ml/m RA Volume:   33.40 ml  17.36 ml/m LA Vol (A4C):   63.4 ml 32.95 ml/m LA Biplane Vol: 63.0 ml 32.74 ml/m  AORTIC VALVE                   PULMONIC VALVE AV Area (Vmax):    2.33 cm    PV Vmax:       0.96 m/s AV Area (Vmean):   2.49 cm    PV Peak grad:  3.7 mmHg AV Area (VTI):     2.54 cm AV Vmax:           133.00 cm/s AV Vmean:          94.000 cm/s AV VTI:            0.321 m AV Peak Grad:      7.1 mmHg AV Mean Grad:      4.0 mmHg LVOT Vmax:         89.40 cm/s LVOT Vmean:        67.700 cm/s LVOT VTI:           0.235 m LVOT/AV VTI ratio: 0.73  AORTA Ao Root diam: 2.80 cm MITRAL VALVE               TRICUSPID VALVE MV Area (PHT): 3.73 cm    TR Peak grad:   28.9 mmHg MV E velocity: 94.70 cm/s  TR Vmax:        269.00 cm/s MV A velocity: 96.00 cm/s MV E/A ratio:  0.99        SHUNTS                            Systemic VTI:  0.24 m                            Systemic Diam: 2.10 cm Shaukat Khan MD Electronically signed by Shaukat Khan MD Signature Date/Time: 10/15/2020/9:46:41 AM    Final     Assessment/Plan The patient is a 74 year old female with a past medical history of chronic anemia, hypertension, tobacco abuse, PAD (under the care of her vascular surgeon in [place]) who presents with bilateral lower extremity leg pain.   1. PAD: Patient with known hx of PAD requiring endovascular intervention bilaterally. States that during recent vascular appointment the patient notes she underwent a "test" that showed normal blood flow. States she was told her discomfort   was from "her knees". Unable to palpate pedal pulses on exam however the extremities are warm. Patient notes left lower extremity is more painful when compared to the right. Procedure, risks and benefits were explained to the patient. All questions answered. Patient wishes to proceed. Will plan on this today.  2. Tobacco Abuse: We had a discussion for approximately three minutes regarding the absolute need for smoking cessation due to the deleterious nature of tobacco on the vascular system. We discussed the tobacco use would diminish patency of any intervention, and likely significantly worsen progression of disease. We discussed multiple agents for quitting including replacement therapy or medications to reduce cravings such as Chantix. The patient voices their understanding of the importance of smoking cessation.   3. Anemia: Currently being worked up  Discussed with Dr. Dew  Jody Kovaleski A Macaulay Reicher, PA-C  10/16/2020 12:42 PM  This note was  created with Dragon medical transcription system.  Any error is purely unintentional.  

## 2020-10-16 NOTE — ED Notes (Signed)
Pt C/O severe sore throat. MD notified. See new orders.

## 2020-10-16 NOTE — Progress Notes (Signed)
ANTICOAGULATION CONSULT NOTE  Pharmacy Consult for heparin infusion Indication:  ACS/STEMI  Allergies  Allergen Reactions   Gabapentin Other (See Comments)    Nervous, shaky   Plavix [Clopidogrel Bisulfate] Rash    Patient Measurements: Height: 5\' 5"  (165.1 cm) Weight: 85 kg (187 lb 6.3 oz) IBW/kg (Calculated) : 57 Heparin Dosing Weight: 75.4 kg  Vital Signs: Temp: 98.9 F (37.2 C) (07/25 0200) BP: 129/63 (07/25 0200) Pulse Rate: 82 (07/25 0200)  Labs: Recent Labs    10/14/20 1745 10/14/20 1959 10/14/20 2254 10/15/20 0250 10/15/20 1349 10/16/20 0202  HGB 9.8*  --   --  9.1*  --   --   HCT 29.4*  --   --  27.8*  --   --   PLT 488*  --   --  407*  --   --   APTT  --   --  33  --   --   --   LABPROT  --   --  14.8  --   --   --   INR  --   --  1.2  --   --   --   HEPARINUNFRC  --   --   --   --  <0.10* 0.63  CREATININE 1.45*  --   --  1.24*  --   --   CKTOTAL 65  --   --   --   --   --   TROPONINIHS 67* 319* 358* 412*  --   --      Estimated Creatinine Clearance: 41.6 mL/min (A) (by C-G formula based on SCr of 1.24 mg/dL (H)).   Medical History: Past Medical History:  Diagnosis Date   Anemia    Hypertension    Peripheral artery disease (HCC)    Renal disorder     Medications:  Per chart review, pt not taking any anticoagulation prior to admission  Assessment: 75 yo female who presents to the emergency department today via EMS after being found down outside. Pharmacy has been consulted for heparin dosing/monitoring for ACS/STEMI. HS trop 67 >319  7/24 1349 HL < 0.10  Goal of Therapy:  Heparin level 0.3-0.7 units/ml Monitor platelets by anticoagulation protocol: Yes   Plan:  7/25:  HL @ 0202 = 0.63 Will continue pt on current rate and draw confirmation level on 7/25 @ 1000.   Stonewall Doss D, PharmD,  10/16/2020,3:20 AM

## 2020-10-16 NOTE — Interval H&P Note (Signed)
History and Physical Interval Note:  10/16/2020 2:44 PM  Jody Lowe  has presented today for surgery, with the diagnosis of PAD, leg pain.  The various methods of treatment have been discussed with the patient and family. After consideration of risks, benefits and other options for treatment, the patient has consented to  Procedure(s): Lower Extremity Angiography (N/A) as a surgical intervention.  The patient's history has been reviewed, patient examined, no change in status, stable for surgery.  I have reviewed the patient's chart and labs.  Questions were answered to the patient's satisfaction.     Festus Barren

## 2020-10-16 NOTE — ED Notes (Signed)
Pt assisted to [facility] for BM. Pt tolerated well.

## 2020-10-16 NOTE — ED Notes (Signed)
PT working with pt in room at this time.

## 2020-10-16 NOTE — ED Notes (Signed)
Pt assisted back to bed from [facility]. Pt tolerated well.

## 2020-10-16 NOTE — ED Notes (Signed)
MD at bedside assessing pt and updating pt on plan of care.  

## 2020-10-16 NOTE — H&P (View-Only) (Signed)
[facility] Vascular Consult Note  MRN : [id number]  Jody Lowe is a 75 y.o. (04/13/1945) female who presents with chief complaint of  Chief Complaint  Patient presents with   Heat Exposure   Hyperthermia   History of Present Illness:  Jody Lowe is a 75 year old female brought to the emergency room via EMS after bystander called for patient found on the floor for unknown duration today.    Patient states the last thing she remembers is coming home after grocery shopping and getting out of her car.  She does not recollect the time that was.  This past week patient does describe having vertigo and dizziness and palpitations.  On her last admission in February 2021 patient was admitted for left arm numbness for TIA and had a negative MRI then.  Patient today denies any complaints of chest pain palpitation shortness of breath headache blurred vision speech or gait ataxia, no abdominal or bladder issues.    Patient only reports leg pains and claudication-like symptoms due to her PAD that her vascular doctor knows about at [facility] patient also reports that she has a stent in her legs.  Counseled patient on tobacco cessation is ongoing tobacco because a stent closures and limb ischemia and among other complications patient states that she is aware that she smoking can cause state closures and make her blood supply worse.  Chart reviewed and discussed with patient about her anemia patient states that he does recall having ulcer treated by endoscopy by GI at [facility].  Patient does take Fosamax.   Vascular Surgery was consulted for PAD.  Current Facility-Administered Medications  Medication Dose Route Frequency Provider Last Rate Last Admin   0.9 %  sodium chloride infusion   Intravenous Continuous Alford Highland, MD 30 mL/hr at 10/16/20 0934 Rate Change at 10/16/20 0934   acetaminophen (TYLENOL) tablet 650 mg  650 mg Oral Q6H PRN Gertha Calkin, MD   650 mg at 10/15/20  1542   Or   acetaminophen (TYLENOL) suppository 650 mg  650 mg Rectal Q6H PRN Gertha Calkin, MD       albuterol (PROVENTIL) (2.5 MG/3ML) 0.083% nebulizer solution 3 mL  3 mL Nebulization Q6H PRN Gertha Calkin, MD       amitriptyline (ELAVIL) tablet 25 mg  25 mg Oral QHS Gertha Calkin, MD   25 mg at 10/15/20 2139   aspirin EC tablet 81 mg  81 mg Oral Daily Gertha Calkin, MD   81 mg at 10/16/20 1125   colchicine tablet 0.6 mg  0.6 mg Oral Daily Alford Highland, MD   0.6 mg at 10/16/20 1123   cyanocobalamin ((VITAMIN B-12)) injection 1,000 mcg  1,000 mcg Intramuscular Daily Alford Highland, MD   1,000 mcg at 10/16/20 1124   ferrous sulfate tablet 325 mg  325 mg Oral Katherine Mantle, MD   325 mg at 10/16/20 1125   FLUoxetine (PROZAC) capsule 80 mg  80 mg Oral Daily Irena Cords V, MD   80 mg at 10/16/20 1124   fluticasone (FLONASE) 50 MCG/ACT nasal spray 1 spray  1 spray Each Nare Daily Gertha Calkin, MD   1 spray at 10/16/20 1122   ipratropium (ATROVENT) 0.03 % nasal spray 2 spray  2 spray Each Nare TID Gertha Calkin, MD   2 spray at 10/16/20 1122   mirabegron ER (MYRBETRIQ) tablet 25 mg  25 mg Oral Daily Gertha Calkin,  MD   25 mg at 10/16/20 1124   nicotine (NICODERM CQ - dosed in mg/24 hours) patch 21 mg  21 mg Transdermal Daily Irena Cords V, MD   21 mg at 10/15/20 0849   pantoprazole (PROTONIX) EC tablet 40 mg  40 mg Oral Daily Gertha Calkin, MD   40 mg at 10/16/20 1124   Current Outpatient Medications  Medication Sig Dispense Refill   albuterol (VENTOLIN HFA) 108 (90 Base) MCG/ACT inhaler Inhale 1-2 puffs into the lungs every 6 (six) hours as needed for wheezing or shortness of breath.     alendronate (FOSAMAX) 70 MG tablet Take 70 mg by mouth once a week.     amitriptyline (ELAVIL) 25 MG tablet Take 25 mg by mouth at bedtime.     aspirin EC 81 MG tablet Take 81 mg by mouth daily.     Cholecalciferol (D3-1000) 25 MCG (1000 UT) capsule Take 1,000 Units by mouth daily.     diclofenac  Sodium (VOLTAREN) 1 % GEL Apply 2 g topically 4 (four) times daily.     esomeprazole (NEXIUM) 40 MG capsule Take 40 mg by mouth daily.     ferrous sulfate 325 (65 FE) MG tablet Take 325 mg by mouth every other day.     FLUoxetine (PROZAC) 40 MG capsule Take 40 mg by mouth daily. Take one capsule (40 mg) by mouth once daily for one month, then increase to two capsules (80 mg) thereafter     fluticasone (FLONASE) 50 MCG/ACT nasal spray Place 1 spray into both nostrils daily.     ipratropium (ATROVENT) 0.03 % nasal spray Place 2 sprays into both nostrils 3 (three) times daily.     lovastatin (MEVACOR) 40 MG tablet Take 40 mg by mouth daily with supper.     meclizine (ANTIVERT) 25 MG tablet Take 1 tablet (25 mg total) by mouth 3 (three) times daily as needed for dizziness or nausea. 30 tablet 1   nystatin (MYCOSTATIN/NYSTOP) powder Apply 1 application topically 2 (two) times daily.     oxybutynin (DITROPAN-XL) 10 MG 24 hr tablet Take 10 mg by mouth daily.     Oxycodone HCl 10 MG TABS Take 10 mg by mouth every 6 (six) hours as needed (pain).     polyethylene glycol (MIRALAX / GLYCOLAX) 17 g packet Take 17 g by mouth daily as needed for mild constipation.     propranolol (INDERAL) 20 MG tablet Take 20 mg by mouth 2 (two) times daily.     senna-docusate (SENOKOT-S) 8.6-50 MG tablet Take 2 tablets by mouth daily.     Vitamin D, Ergocalciferol, (DRISDOL) 1.25 MG (50000 UNIT) CAPS capsule Take 50,000 Units by mouth once a week. For 8 doses     mirabegron ER (MYRBETRIQ) 25 MG TB24 tablet Take 25 mg by mouth daily. (Patient not taking: Reported on 10/14/2020)     naloxone [facility]) nasal spray 4 mg/0.1 mL Place 1 spray into the nose once. (Patient not taking: Reported on 10/14/2020)     varenicline (CHANTIX) 0.5 MG tablet Take 0.5 mg by mouth 2 (two) times daily. (Patient not taking: No sig reported)     Past Medical History:  Diagnosis Date   Anemia    Hypertension    Peripheral artery disease (HCC)     Renal disorder    Past Surgical History:  Procedure Laterality Date   BREAST REDUCTION SURGERY     CATARACT EXTRACTION W/PHACO Left 11/11/2019   Procedure: CATARACT EXTRACTION PHACO AND  INTRAOCULAR LENS PLACEMENT (IOC) LEFT VISION BLUE;  Surgeon: Elliot CousinHarrow, Brian, MD;  [facility];  Service: Ophthalmology;  Laterality: Left;  7.94 0:42.4   CATARACT EXTRACTION W/PHACO Right 12/23/2019   Procedure: CATARACT EXTRACTION PHACO AND INTRAOCULAR LENS PLACEMENT (IOC) RIGHT VISION BLUE 5.47  00:31.1;  Surgeon: Elliot CousinHarrow, Brian, MD;  [facility];  Service: Ophthalmology;  Laterality: Right;   Social History Social History   Tobacco Use   Smoking status: Every Day    Packs/day: 0.50    Types: Cigarettes   Smokeless tobacco: Never  Substance Use Topics   Alcohol use: No   Drug use: No   Family History History reviewed. No pertinent family history. Denies family history of PAD, Venous Disease or Renal Disease.  Allergies  Allergen Reactions   Gabapentin Other (See Comments)    Nervous, shaky   Plavix [Clopidogrel Bisulfate] Rash   REVIEW OF SYSTEMS (Negative unless checked)  Constitutional: [] Weight loss  [] Fever  [] Chills Cardiac: [] Chest pain   [] Chest pressure   [] Palpitations   [] Shortness of breath when laying flat   [] Shortness of breath at rest   [] Shortness of breath with exertion. Vascular:  [x] Pain in legs with walking   [x] Pain in legs at rest   [] Pain in legs when laying flat   [] Claudication   [x] Pain in feet when walking  [x] Pain in feet at rest  [] Pain in feet when laying flat   [] History of DVT   [] Phlebitis   [] Swelling in legs   [] Varicose veins   [] Non-healing ulcers Pulmonary:   [] Uses home oxygen   [] Productive cough   [] Hemoptysis   [] Wheeze  [] COPD   [] Asthma Neurologic:  [] Dizziness  [] Blackouts   [] Seizures   [] History of stroke   [] History of TIA  [] Aphasia   [] Temporary blindness   [] Dysphagia   [] Weakness or numbness in arms    [] Weakness or numbness in legs Musculoskeletal:  [] Arthritis   [] Joint swelling   [] Joint pain   [] Low back pain Hematologic:  [] Easy bruising  [] Easy bleeding   [] Hypercoagulable state   [x] Anemic  [] Hepatitis Gastrointestinal:  [] Blood in stool   [] Vomiting blood  [] Gastroesophageal reflux/heartburn   [] Difficulty swallowing. Genitourinary:  [] Chronic kidney disease   [] Difficult urination  [] Frequent urination  [] Burning with urination   [] Blood in urine Skin:  [] Rashes   [] Ulcers   [] Wounds Psychological:  [] History of anxiety   []  History of major depression.  Physical Examination  Vitals:   10/16/20 0630 10/16/20 0930 10/16/20 1030 10/16/20 1223  BP: 132/60 (!) 133/58 133/86 133/74  Pulse: 88 83 89 87  Resp: 16 14  16   Temp: 98.7 F (37.1 C) 98.5 F (36.9 C) 98.3 F (36.8 C) 98.9 F (37.2 C)  TempSrc:      SpO2: 96% 96% 98% 100%  Weight:      Height:       Body mass index is 31.18 kg/m. Gen:  WD/WN, NAD Head: [place]/AT, No temporalis wasting. Prominent temp pulse not noted. Ear/Nose/Throat: Hearing grossly intact, nares w/o erythema or drainage, oropharynx w/o Erythema/Exudate Eyes: Sclera non-icteric, conjunctiva clear Neck: Trachea midline.  No JVD.  Pulmonary:  Good air movement, respirations not labored, equal bilaterally.  Cardiac: RRR, normal S1, S2. Vascular:  Vessel Right Left  Radial Palpable Palpable  Ulnar Palpable Palpable  Brachial Palpable Palpable  Carotid Palpable, without bruit Palpable, without bruit  Aorta Not palpable N/A  Femoral Palpable Palpable  Popliteal Palpable Palpable  PT Non-Palpable Non-Palpable  DP Non-Palpable Non-Palpable   Gastrointestinal: soft, non-tender/non-distended. No guarding/reflex.  Musculoskeletal: M/S 5/5 throughout.  Extremities without ischemic changes.  No deformity or atrophy. No edema. Neurologic: Sensation grossly intact in extremities.  Symmetrical.  Speech is fluent. Motor exam as listed above. Psychiatric:  Judgment intact, Mood & affect appropriate for pt's clinical situation. Dermatologic: No rashes or ulcers noted.  No cellulitis or open wounds. Lymph : No Cervical, Axillary, or Inguinal lymphadenopathy.  CBC Lab Results  Component Value Date   WBC 8.6 10/16/2020   HGB 7.9 (L) 10/16/2020   HCT 24.2 (L) 10/16/2020   MCV 83.4 10/16/2020   PLT 362 10/16/2020   BMET    Component Value Date/Time   NA 133 (L) 10/16/2020 0601   K 3.5 10/16/2020 0601   CL 107 10/16/2020 0601   CO2 21 (L) 10/16/2020 0601   GLUCOSE 87 10/16/2020 0601   BUN 11 10/16/2020 0601   CREATININE 1.18 (H) 10/16/2020 0601   CALCIUM 8.3 (L) 10/16/2020 0601   GFRNONAA 48 (L) 10/16/2020 0601   GFRAA >60 09/20/2019 1312   Estimated Creatinine Clearance: 43.7 mL/min (A) (by C-G formula based on SCr of 1.18 mg/dL (H)).  COAG Lab Results  Component Value Date   INR 1.2 10/14/2020   INR 1.1 05/01/2019   Radiology DG Knee 1-2 Views Left  Result Date: 10/15/2020 CLINICAL DATA:  Chronic knee pain. EXAM: LEFT KNEE - 1-2 VIEW COMPARISON:  None. FINDINGS: Marked loss of joint space noted medial compartment with mild joint space loss laterally. Hypertrophic spurring is noted in all 3 compartments. Probable tiny joint effusion. No worrisome lytic or sclerotic osseous abnormality. IMPRESSION: Tricompartmental degenerative changes without acute bony findings. Electronically Signed   By: Kennith Center M.D.   On: 10/15/2020 08:52   DG Knee 1-2 Views Right  Result Date: 10/15/2020 CLINICAL DATA:  Chronic knee pain. EXAM: RIGHT KNEE - 1-2 VIEW COMPARISON:  None. FINDINGS: Two-view exam shows no evidence for an acute fracture. Loss of joint space noted in both the medial and lateral compartments, medial compartment more affected. Hypertrophic spurring in all 3 compartments is moderate. Possible small joint effusion. Vascular stent device noted lower thigh. IMPRESSION: 1. Tricompartmental degenerative changes without acute bony  abnormality. 2. Possible small joint effusion. Electronically Signed   By: Kennith Center M.D.   On: 10/15/2020 08:51   CT Head Wo Contrast  Result Date: 10/14/2020 CLINICAL DATA:  Fall with head and neck pain. EXAM: CT HEAD WITHOUT CONTRAST CT CERVICAL SPINE WITHOUT CONTRAST TECHNIQUE: Multidetector CT imaging of the head and cervical spine was performed following the standard protocol without intravenous contrast. Multiplanar CT image reconstructions of the cervical spine were also generated. COMPARISON:  CT head dated 09/20/2019. FINDINGS: CT HEAD FINDINGS Brain: No evidence of acute infarction, hemorrhage, hydrocephalus, extra-axial collection or mass lesion/mass effect. Periventricular white matter hypoattenuation likely represents chronic small vessel ischemic disease. Vascular: There are vascular calcifications in the carotid siphons. Skull: Normal. Negative for fracture or focal lesion. Sinuses/Orbits: No acute finding. Other: There is soft tissue swelling overlying the right forehead and frontal bone. CT CERVICAL SPINE FINDINGS Alignment: Normal. Skull base and vertebrae: No acute fracture. No primary bone lesion or focal pathologic process. Soft tissues and spinal canal: No prevertebral fluid or swelling. No visible canal hematoma. Disc levels: Up to moderate to severe multilevel degenerative disc and joint disease. Upper chest: Negative. Other: None. IMPRESSION: 1. No acute intracranial process. 2. No acute osseous injury in the cervical spine. Electronically  Signed   By: Romona Curls M.D.   On: 10/14/2020 19:24   CT Cervical Spine Wo Contrast  Result Date: 10/14/2020 CLINICAL DATA:  Fall with head and neck pain. EXAM: CT HEAD WITHOUT CONTRAST CT CERVICAL SPINE WITHOUT CONTRAST TECHNIQUE: Multidetector CT imaging of the head and cervical spine was performed following the standard protocol without intravenous contrast. Multiplanar CT image reconstructions of the cervical spine were also generated.  COMPARISON:  CT head dated 09/20/2019. FINDINGS: CT HEAD FINDINGS Brain: No evidence of acute infarction, hemorrhage, hydrocephalus, extra-axial collection or mass lesion/mass effect. Periventricular white matter hypoattenuation likely represents chronic small vessel ischemic disease. Vascular: There are vascular calcifications in the carotid siphons. Skull: Normal. Negative for fracture or focal lesion. Sinuses/Orbits: No acute finding. Other: There is soft tissue swelling overlying the right forehead and frontal bone. CT CERVICAL SPINE FINDINGS Alignment: Normal. Skull base and vertebrae: No acute fracture. No primary bone lesion or focal pathologic process. Soft tissues and spinal canal: No prevertebral fluid or swelling. No visible canal hematoma. Disc levels: Up to moderate to severe multilevel degenerative disc and joint disease. Upper chest: Negative. Other: None. IMPRESSION: 1. No acute intracranial process. 2. No acute osseous injury in the cervical spine. Electronically Signed   By: Romona Curls M.D.   On: 10/14/2020 19:24   MR BRAIN WO CONTRAST  Result Date: 10/15/2020 CLINICAL DATA:  Recurrent syncope EXAM: MRI HEAD WITHOUT CONTRAST TECHNIQUE: Multiplanar, multiecho pulse sequences of the brain and surrounding structures were obtained without intravenous contrast. COMPARISON:  05/01/2019 FINDINGS: Brain: No acute infarct, mass effect or extra-axial collection. No acute or chronic hemorrhage. There is multifocal hyperintense T2-weighted signal within the white matter. Parenchymal volume and CSF spaces are normal. The midline structures are normal. Vascular: Major flow voids are preserved. Skull and upper cervical spine: Right frontal scalp hematoma Sinuses/Orbits:No paranasal sinus fluid levels or advanced mucosal thickening. No mastoid or middle ear effusion. Normal orbits. IMPRESSION: 1. No acute intracranial abnormality. 2. Findings of mild chronic small vessel disease. 3. Right frontal scalp  hematoma. Electronically Signed   By: Deatra Robinson M.D.   On: 10/15/2020 00:01   US Carotid Bilateral  Result Date: 10/14/2020 CLINICAL DATA:  Syncope EXAM: BILATERAL CAROTID DUPLEX ULTRASOUND TECHNIQUE: Wallace Cullens scale imaging, color Doppler and duplex ultrasound were performed of bilateral carotid and vertebral arteries in the neck. COMPARISON:  05/01/2019 FINDINGS: Criteria: Quantification of carotid stenosis is based on velocity parameters that correlate the residual internal carotid diameter with NASCET-based stenosis levels, using the diameter of the distal internal carotid lumen as the denominator for stenosis measurement. The following velocity measurements were obtained: RIGHT ICA: 158 cm/sec CCA: 93 cm/sec SYSTOLIC ICA/CCA RATIO:  1.5 ECA: 52 cm/sec LEFT ICA: 195 cm/sec CCA: 82 cm/sec SYSTOLIC ICA/CCA RATIO:  2.3 ECA: 190 cm/sec RIGHT CAROTID ARTERY: Mild atherosclerosis at the carotid bifurcation. Normal waveform. RIGHT VERTEBRAL ARTERY:  Antegrade flow with normal waveform LEFT CAROTID ARTERY: Moderate atherosclerosis, predominantly affecting the ICA origin, but also narrowing the proximal ECA. LEFT VERTEBRAL ARTERY:  Antegrade flow with normal waveform IMPRESSION: 1. 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 2. Antegrade flow in the vertebral arteries. Electronically Signed   By: Deatra Robinson M.D.   On: 10/14/2020 23:00   ECHOCARDIOGRAM COMPLETE  Result Date: 10/15/2020    ECHOCARDIOGRAM REPORT   Patient Name:   Jody Lowe Date of Exam: 10/15/2020 Medical Rec #:  [id number]      Height:  65.0 in Accession #:    [id number]     Weight:       187.4 lb Date of Birth:  1945/09/12     BSA:          1.924 m Patient Age:    74 years       BP:           121/49 mmHg Patient Gender: F              HR:           76 bpm. Exam Location:  [facility] Procedure: 2D Echo Indications:     Syncope  History:         Patient has prior history of Echocardiogram  examinations. TIA;                  Risk Factors:Hypertension.  Sonographer:     L Thornton-Maynard Referring Phys:  [id number] QVZD G PATEL Diagnosing Phys: Adrian Blackwater MD IMPRESSIONS  1. Left ventricular ejection fraction, by estimation, is 60 to 65%. The left ventricle has normal function. The left ventricle has no regional wall motion abnormalities. Left ventricular diastolic parameters are consistent with Grade I diastolic dysfunction (impaired relaxation).  2. Right ventricular systolic function is normal. The right ventricular size is normal. There is normal pulmonary artery systolic pressure.  3. The mitral valve is normal in structure. Mild mitral valve regurgitation. No evidence of mitral stenosis.  4. The aortic valve is normal in structure. Aortic valve regurgitation is not visualized. No aortic stenosis is present.  5. The inferior vena cava is normal in size with greater than 50% respiratory variability, suggesting right atrial pressure of 3 mmHg. FINDINGS  Left Ventricle: Left ventricular ejection fraction, by estimation, is 60 to 65%. The left ventricle has normal function. The left ventricle has no regional wall motion abnormalities. The left ventricular internal cavity size was normal in size. There is  no left ventricular hypertrophy. Left ventricular diastolic parameters are consistent with Grade I diastolic dysfunction (impaired relaxation). Right Ventricle: The right ventricular size is normal. No increase in right ventricular wall thickness. Right ventricular systolic function is normal. There is normal pulmonary artery systolic pressure. The tricuspid regurgitant velocity is 2.69 m/s, and  with an assumed right atrial pressure of 3 mmHg, the estimated right ventricular systolic pressure is 31.9 mmHg. Left Atrium: Left atrial size was normal in size. Right Atrium: Right atrial size was normal in size. Pericardium: There is no evidence of pericardial effusion. Mitral Valve: The mitral valve is  normal in structure. Mild mitral valve regurgitation. No evidence of mitral valve stenosis. Tricuspid Valve: The tricuspid valve is normal in structure. Tricuspid valve regurgitation is mild . No evidence of tricuspid stenosis. Aortic Valve: The aortic valve is normal in structure. Aortic valve regurgitation is not visualized. No aortic stenosis is present. Aortic valve mean gradient measures 4.0 mmHg. Aortic valve peak gradient measures 7.1 mmHg. Aortic valve area, by VTI measures 2.54 cm. Pulmonic Valve: The pulmonic valve was normal in structure. Pulmonic valve regurgitation is not visualized. No evidence of pulmonic stenosis. Aorta: The aortic root is normal in size and structure. Venous: The inferior vena cava is normal in size with greater than 50% respiratory variability, suggesting right atrial pressure of 3 mmHg. IAS/Shunts: No atrial level shunt detected by color flow Doppler.  LEFT VENTRICLE PLAX 2D LVIDd:         4.68 cm  Diastology LVIDs:  2.97 cm  LV e' medial:    8.49 cm/s LV PW:         1.01 cm  LV E/e' medial:  11.2 LV IVS:        1.08 cm  LV e' lateral:   8.49 cm/s LVOT diam:     2.10 cm  LV E/e' lateral: 11.2 LV SV:         81 LV SV Index:   42 LVOT Area:     3.46 cm  RIGHT VENTRICLE RV S prime:     11.70 cm/s TAPSE (M-mode): 2.8 cm LEFT ATRIUM             Index       RIGHT ATRIUM           Index LA diam:        3.10 cm 1.61 cm/m  RA Area:     14.30 cm LA Vol (A2C):   62.8 ml 32.64 ml/m RA Volume:   33.40 ml  17.36 ml/m LA Vol (A4C):   63.4 ml 32.95 ml/m LA Biplane Vol: 63.0 ml 32.74 ml/m  AORTIC VALVE                   PULMONIC VALVE AV Area (Vmax):    2.33 cm    PV Vmax:       0.96 m/s AV Area (Vmean):   2.49 cm    PV Peak grad:  3.7 mmHg AV Area (VTI):     2.54 cm AV Vmax:           133.00 cm/s AV Vmean:          94.000 cm/s AV VTI:            0.321 m AV Peak Grad:      7.1 mmHg AV Mean Grad:      4.0 mmHg LVOT Vmax:         89.40 cm/s LVOT Vmean:        67.700 cm/s LVOT VTI:           0.235 m LVOT/AV VTI ratio: 0.73  AORTA Ao Root diam: 2.80 cm MITRAL VALVE               TRICUSPID VALVE MV Area (PHT): 3.73 cm    TR Peak grad:   28.9 mmHg MV E velocity: 94.70 cm/s  TR Vmax:        269.00 cm/s MV A velocity: 96.00 cm/s MV E/A ratio:  0.99        SHUNTS                            Systemic VTI:  0.24 m                            Systemic Diam: 2.10 cm Adrian Blackwater MD Electronically signed by Adrian Blackwater MD Signature Date/Time: 10/15/2020/9:46:41 AM    Final     Assessment/Plan The patient is a 75 year old female with a past medical history of chronic anemia, hypertension, tobacco abuse, PAD (under the care of her vascular surgeon in [facility]) who presents with bilateral lower extremity leg pain.   1. PAD: Patient with known hx of PAD requiring endovascular intervention bilaterally. States that during recent vascular appointment the patient notes she underwent a "test" that showed normal blood flow. States she was told her discomfort  was from "her knees". Unable to palpate pedal pulses on exam however the extremities are warm. Patient notes left lower extremity is more painful when compared to the right. Procedure, risks and benefits were explained to the patient. All questions answered. Patient wishes to proceed. Will plan on this today.  2. Tobacco Abuse: We had a discussion for approximately three minutes regarding the absolute need for smoking cessation due to the deleterious nature of tobacco on the vascular system. We discussed the tobacco use would diminish patency of any intervention, and likely significantly worsen progression of disease. We discussed multiple agents for quitting including replacement therapy or medications to reduce cravings such as Chantix. The patient voices their understanding of the importance of smoking cessation.   3. Anemia: Currently being worked up  Discussed with Dr. Weldon Inches, PA-C  10/16/2020 12:42 PM  This note was  created with Dragon medical transcription system.  Any error is purely unintentional.

## 2020-10-16 NOTE — Evaluation (Addendum)
Physical Therapy Evaluation Patient Details Name: Jody Lowe MRN: [id number] DOB: [birth year] Today's Date: 10/16/2020   History of Present Illness  Pt is a 75 y.o. F arriving to ED following a syncopal episode due to possible heat stroke. PMH includes anemia, HTN, PAD, renal disorder.  Clinical Impression  Pt alert, motivated for therapy. PLOF is modified independent with household ambulation using a 3-wheel RW. Pt does not cook or get groceries, indicating she uses meals on wheels or her son cooks.   Pt is independent with mobility and does not required bed rail usage. Transfers require min-guard for safety with 2-RW. Ambulated small steps forward, backward, and side stepping w/ 2-RW, min-guard without LOB or signs of instability. Pt noted pain with light touch to dorsal foot, and calf but full sensation intact to BLE. Skilled PT intervention is indicated to address deficits in function, mobility, and to return to PLOF as able.  HHPT as discharge recommendation to maximize function and safety.       Follow Up Recommendations Home health PT    Equipment Recommendations  Rolling walker with 5" wheels    Recommendations for Other Services       Precautions / Restrictions Precautions Precautions: Fall Restrictions Weight Bearing Restrictions: No      Mobility  Bed Mobility Overal bed mobility: Independent             General bed mobility comments: Supine > sit without use of bed rails    Transfers Overall transfer level: Needs assistance Equipment used: Rolling walker (2 wheeled) Transfers: Sit to/from Stand Sit to Stand: Min guard         General transfer comment: Min-gaurd for safety  Ambulation/Gait Ambulation/Gait assistance: Min guard Gait Distance (Feet): 1 Feet Assistive device: Rolling walker (2 wheeled)   Gait velocity: Decreased   General Gait Details: Able to step forward, backward, and sidestep within room without LOB; further distance not  tested due to equipment setup  Stairs            Wheelchair Mobility    Modified Rankin (Stroke Patients Only)       Balance Overall balance assessment: Needs assistance Sitting-balance support: No upper extremity supported Sitting balance-Leahy Scale: Good Sitting balance - Comments: Able to life arms without support at the feet   Standing balance support: Bilateral upper extremity supported Standing balance-Leahy Scale: Fair Standing balance comment: Requires BUE support                             Pertinent Vitals/Pain Pain Assessment: Faces Faces Pain Scale: Hurts little more Pain Location: Bilateral feet, calf to light touch; lower back Pain Descriptors / Indicators: Sore;Discomfort Pain Intervention(s): Limited activity within patient's tolerance;Monitored during session;Repositioned    Home Living Family/patient expects to be discharged to:: Private residence Living Arrangements: Alone Available Help at Discharge: Family;Friend(s) Type of Home: Apartment Home Access: Level entry     Home Layout: One level Home Equipment: Shower seat;Bedside commode (walker 3- wheels)      Prior Function Level of Independence: Needs assistance   Gait / Transfers Assistance Needed: Modified independent with household ambulation with 3-RW  ADL's / Homemaking Assistance Needed: Pt does not drive, cook, or leave her apartment without assitance normally; pt did state during prior to syncopal episode and ED admittance she just returned from "grocery store"        Hand Dominance        Extremity/Trunk  Assessment   Upper Extremity Assessment Upper Extremity Assessment: Overall WFL for tasks assessed    Lower Extremity Assessment Lower Extremity Assessment: Overall WFL for tasks assessed    Cervical / Trunk Assessment Cervical / Trunk Assessment: Normal  Communication   Communication: No difficulties  Cognition Arousal/Alertness: Awake/alert Behavior  During Therapy: WFL for tasks assessed/performed Overall Cognitive Status: Within Functional Limits for tasks assessed                                 General Comments: AOx4      General Comments      Exercises     Assessment/Plan    PT Assessment Patient needs continued PT services  PT Problem List Decreased strength;Decreased range of motion;Decreased activity tolerance;Decreased balance;Decreased mobility;Decreased coordination       PT Treatment Interventions Gait training;Stair training;Functional mobility training;Therapeutic activities;Therapeutic exercise;Neuromuscular re-education;Balance training    PT Goals (Current goals can be found in the Care Plan section)  Acute Rehab PT Goals Patient Stated Goal: to go home PT Goal Formulation: With patient Time For Goal Achievement: 10/30/20 Potential to Achieve Goals: Good    Frequency Min 2X/week   Barriers to discharge        Co-evaluation               AM-PAC PT "6 Clicks" Mobility  Outcome Measure Help needed turning from your back to your side while in a flat bed without using bedrails?: None Help needed moving from lying on your back to sitting on the side of a flat bed without using bedrails?: None Help needed moving to and from a bed to a chair (including a wheelchair)?: A Little Help needed standing up from a chair using your arms (e.g., wheelchair or bedside chair)?: A Little Help needed to walk in hospital room?: A Little Help needed climbing 3-5 steps with a railing? : A Lot 6 Click Score: 19    End of Session Equipment Utilized During Treatment: Gait belt Activity Tolerance: Patient tolerated treatment well Patient left: in bed;with call bell/phone within reach   PT Visit Diagnosis: Other abnormalities of gait and mobility (R26.89);Muscle weakness (generalized) (M62.81);History of falling (Z91.81)    Time: 3614-4315 PT Time Calculation (min) (ACUTE ONLY): 29 min   Charges:              [employer], SPT

## 2020-10-17 ENCOUNTER — Encounter: Payer: Self-pay | Admitting: Vascular Surgery

## 2020-10-17 DIAGNOSIS — I739 Peripheral vascular disease, unspecified: Secondary | ICD-10-CM | POA: Diagnosis not present

## 2020-10-17 DIAGNOSIS — R55 Syncope and collapse: Secondary | ICD-10-CM | POA: Diagnosis not present

## 2020-10-17 DIAGNOSIS — D500 Iron deficiency anemia secondary to blood loss (chronic): Secondary | ICD-10-CM | POA: Diagnosis not present

## 2020-10-17 DIAGNOSIS — I10 Essential (primary) hypertension: Secondary | ICD-10-CM | POA: Diagnosis not present

## 2020-10-17 DIAGNOSIS — D519 Vitamin B12 deficiency anemia, unspecified: Secondary | ICD-10-CM | POA: Diagnosis not present

## 2020-10-17 DIAGNOSIS — M79604 Pain in right leg: Secondary | ICD-10-CM | POA: Diagnosis not present

## 2020-10-17 DIAGNOSIS — R531 Weakness: Secondary | ICD-10-CM

## 2020-10-17 DIAGNOSIS — N179 Acute kidney failure, unspecified: Secondary | ICD-10-CM | POA: Diagnosis not present

## 2020-10-17 LAB — BASIC METABOLIC PANEL
Anion gap: 9 (ref 5–15)
BUN: 10 mg/dL (ref 8–23)
CO2: 22 mmol/L (ref 22–32)
Calcium: 8.8 mg/dL — ABNORMAL LOW (ref 8.9–10.3)
Chloride: 104 mmol/L (ref 98–111)
Creatinine, Ser: 1.06 mg/dL — ABNORMAL HIGH (ref 0.44–1.00)
GFR, Estimated: 54 mL/min — ABNORMAL LOW (ref >60)
Glucose, Bld: 76 mg/dL (ref 70–99)
Potassium: 3.6 mmol/L (ref 3.5–5.1)
Sodium: 135 mmol/L (ref 135–145)

## 2020-10-17 LAB — CBC WITH DIFFERENTIAL/PLATELET
Abs Immature Granulocytes: 0.05 10*3/uL (ref 0.00–0.07)
Basophils Absolute: 0.1 10*3/uL (ref 0.0–0.1)
Basophils Relative: 1 %
Eosinophils Absolute: 0.1 10*3/uL (ref 0.0–0.5)
Eosinophils Relative: 1 %
HCT: 30.5 % — ABNORMAL LOW (ref 36.0–46.0)
Hemoglobin: 9.6 g/dL — ABNORMAL LOW (ref 12.0–15.0)
Immature Granulocytes: 1 %
Lymphocytes Relative: 14 %
Lymphs Abs: 1.3 10*3/uL (ref 0.7–4.0)
MCH: 27 pg (ref 26.0–34.0)
MCHC: 31.5 g/dL (ref 30.0–36.0)
MCV: 85.9 fL (ref 80.0–100.0)
Monocytes Absolute: 0.8 10*3/uL (ref 0.1–1.0)
Monocytes Relative: 9 %
Neutro Abs: 7.3 10*3/uL (ref 1.7–7.7)
Neutrophils Relative %: 74 %
Platelets: 381 10*3/uL (ref 150–400)
RBC: 3.55 MIL/uL — ABNORMAL LOW (ref 3.87–5.11)
RDW: 16.4 % — ABNORMAL HIGH (ref 11.5–15.5)
WBC: 9.7 10*3/uL (ref 4.0–10.5)
nRBC: 0 % (ref 0.0–0.2)

## 2020-10-17 LAB — TYPE AND SCREEN
ABO/RH(D): O POS
Antibody Screen: POSITIVE
Unit division: 0
Unit division: 0

## 2020-10-17 LAB — RPR: RPR Ser Ql: NONREACTIVE

## 2020-10-17 LAB — BPAM RBC
Blood Product Expiration Date: 202208192359
Blood Product Expiration Date: 202208212359
Unit Type and Rh: 5100
Unit Type and Rh: 5100

## 2020-10-17 MED ORDER — PRASUGREL HCL 10 MG PO TABS: 10.0000 mg | ORAL_TABLET | Freq: Every day | ORAL | 0 refills | Status: DC

## 2020-10-17 MED ORDER — SODIUM CHLORIDE 0.9 % IV SOLN
400.0000 mg | Freq: Once | INTRAVENOUS | Status: AC
  Administered 2020-10-17: 400 mg via INTRAVENOUS
  Filled 2020-10-17: qty 20

## 2020-10-17 MED ORDER — ATORVASTATIN CALCIUM 20 MG PO TABS: 20.0000 mg | ORAL_TABLET | Freq: Every day | ORAL | 0 refills | Status: DC

## 2020-10-17 MED ORDER — FLUOXETINE HCL 40 MG PO CAPS: 80.0000 mg | ORAL_CAPSULE | Freq: Every day | ORAL | 3 refills | Status: AC

## 2020-10-17 MED ORDER — VITAMIN B-12 1000 MCG PO TABS: 1000.0000 ug | ORAL_TABLET | Freq: Every day | ORAL | 0 refills | Status: DC

## 2020-10-17 MED ORDER — NICOTINE 21 MG/24HR TD PT24: MEDICATED_PATCH | TRANSDERMAL | 0 refills | Status: DC

## 2020-10-17 NOTE — Care Management (Signed)
    Durable Medical Equipment  (From admission, onward)           Start     Ordered   10/15/20 1403  For home use only DME standard manual wheelchair with seat cushion  Once       Comments: Patient suffers from arthritis which impairs their ability to perform daily activities like walking in the home.  A walker will not resolve issue with performing activities of daily living. A wheelchair will allow patient to safely perform daily activities. Patient can safely propel the wheelchair in the home or has a caregiver who can provide assistance. Length of need lifetime Accessories: elevating leg rests (ELRs), wheel locks, extensions and anti-tippers.   10/15/20 1403

## 2020-10-17 NOTE — Progress Notes (Signed)
[facility] Daily Progress Note  10/16/20:             1.  Ultrasound guidance for vascular access right femoral artery             2.  Catheter placement into left common femoral artery from right femoral approach             3.  Aortogram and selective left lower extremity angiogram             4.  Percutaneous transluminal angioplasty of left tibioperoneal trunk and proximal peroneal artery with 4 mm diameter by 10 cm length Lutonix drug-coated angioplasty balloon             5.  Percutaneous transluminal angioplasty of the entire left SFA and above-knee popliteal arteries with 5 mm diameter by 30 cm length and 5 mm diameter by 15 cm length Lutonix drug-coated angioplasty balloon             6.  Viabahn stent placement to the mid to distal SFA and was proximal popliteal artery with 6 mm diameter by 15 cm length stent for residual stenosis of greater than 50% this location             7.  Percutaneous transluminal angioplasty of the origin of the left SFA up to the common femoral artery with 6 mm diameter by 6 cm length Lutonix drug-coated angioplasty balloon             8.  StarClose closure device right femoral artery  Subjective: Patient states her left lower extremity feels "much better" today. She is pleased with her results.   Objective: Vitals:   10/16/20 1900 10/16/20 2042 10/17/20 0314 10/17/20 0756  BP: (!) 169/77 (!) 149/72 126/72 127/66  Pulse: 78 85 89 92  Resp: 16 16 17    Temp: 98.3 F (36.8 C) 98.1 F (36.7 C) (!) 97.5 F (36.4 C) 97.8 F (36.6 C)  TempSrc: Oral Oral Axillary   SpO2: 100% 100% 95% 94%  Weight:      Height:        Intake/Output Summary (Last 24 hours) at 10/17/2020 1004 Last data filed at 10/17/2020 0700 Gross per 24 hour  Intake --  Output 200 ml  Net -200 ml   Physical Exam: A&Ox3, NAD CV: RRR Pulmonary: CTA Bilaterally Abdomen: Soft, Non-tender, Non-distended Right Groin:  Access Site: clean, dry and  intact Vascular:  Left Lower Extremity: Thigh soft. Calf soft. Extremity is warm distally to toes. Hard to palpate pedal pulses due to body habitus. No acute vascular compromise.    Laboratory: CBC    Component Value Date/Time   WBC 9.7 10/17/2020 0453   HGB 9.6 (L) 10/17/2020 0453   HCT 30.5 (L) 10/17/2020 0453   PLT 381 10/17/2020 0453   BMET    Component Value Date/Time   NA 135 10/17/2020 0453   K 3.6 10/17/2020 0453   CL 104 10/17/2020 0453   CO2 22 10/17/2020 0453   GLUCOSE 76 10/17/2020 0453   BUN 10 10/17/2020 0453   CREATININE 1.06 (H) 10/17/2020 0453   CALCIUM 8.8 (L) 10/17/2020 0453   GFRNONAA 54 (L) 10/17/2020 0453   GFRAA >60 09/20/2019 1312   Assessment/Planning: The patient is a 75 year old female with multiple medical issues with known PAD who presents with progressively worsening bilateral leg pain - s/p LLE angiogram with intervention - POD#1  1) Patient with improved LLE pain this  AM s/p LLE angiogram with intervention 2) ASA, Effient and Statin for medical management 3) On outpatient schedule for Monday (8/1) for RLE 4) OK from vascular standpoint to discharge home when medically stable  Discussed with Dr. Wallis Mart [facility] PA-C 10/17/2020 10:04 AM

## 2020-10-17 NOTE — TOC Initial Note (Signed)
Transition of Care [facility]) - Initial/Assessment Note    Patient Details  Name: Jody Lowe MRN: [id number] Date of Birth: [birth year]  [facility] CM/SW Contact:    Chapman Fitch, RN Phone Number: 10/17/2020, 3:26 PM  Clinical Narrative:                  Patient to discharge today Patient states she lives at home alone Son lives locally and transports her to appointments Denies issue obtaining medications  PT recommends home health.  Patient agreeable.  States she does not have a preference of home health agency. Referral made and accepted by [facility] with [facility].  Wheelchair to be delivered to room prior to discharge.  Son to transport  Expected Discharge Plan: Home w Home Health Services Barriers to Discharge: No Barriers Identified   Patient Goals and CMS Choice   CMS Medicare.gov Compare Post Acute Care list provided to:: Patient Choice offered to / list presented to : Patient  Expected Discharge Plan and Services Expected Discharge Plan: Home w Home Health Services     Post Acute Care Choice: Home Health   Expected Discharge Date: 10/17/20               DME Arranged: Wheelchair manual DME Agency: [facility]       HH Arranged: PT, OT, Nurse's Aide HH Agency: [facility] Date HH Agency Contacted: 10/17/20   Representative spoke with at [facility] Agency: Barbara Cower  Prior Living Arrangements/Services   Lives with:: Self   Do you feel safe going back to the place where you live?: Yes          Current home services: DME    Activities of Daily Living Home Assistive Devices/Equipment: None ADL Screening (condition at time of admission) Patient's cognitive ability adequate to safely complete daily activities?: Yes Is the patient deaf or have difficulty hearing?: No Does the patient have difficulty seeing, even when wearing glasses/contacts?: No Does the patient have difficulty concentrating, remembering, or making  decisions?: No Patient able to express need for assistance with ADLs?: Yes Does the patient have difficulty dressing or bathing?: No Independently performs ADLs?: Yes (appropriate for developmental age) Does the patient have difficulty walking or climbing stairs?: No Weakness of Legs: None Weakness of Arms/Hands: None  Permission Sought/Granted                  Emotional Assessment Appearance:: Appears stated age     Orientation: : Oriented to Self, Oriented to Place, Oriented to  Time, Oriented to Situation      Admission diagnosis:  Syncope and collapse [R55] Hyperthermia [R50.9] Leg pain [M79.606] Knee pain [M25.569] Elevated troponin [R77.8] Syncope [R55] Patient Active Problem List   Diagnosis Date Noted   Syncope 10/16/2020   Drop in hemoglobin    B12 deficiency    Elevated troponin    Pain in both lower extremities    Chronic pain of both knees    Syncope and collapse 10/14/2020   Anemia 10/14/2020   AKI (acute kidney injury) (HCC) 10/14/2020   TIA (transient ischemic attack) 05/01/2019   Age-related osteoporosis without current pathological fracture 06/27/2016   Tobacco use disorder 05/09/2015   Iron deficiency anemia due to chronic blood loss 03/15/2009   Peripheral vascular disease (HCC) 07/10/2005   Hypertension, benign 03/29/2003   Depressive disorder 10/06/2001   B-complex deficiency 05/01/1995   Esophagitis 08/18/1990   PCP:  Idelia Salm, MD Pharmacy:  [facility] - [place], [place] - [address] [place] [address] Phone: [phone] Fax: [phone]     Social Determinants of Health (SDOH) Interventions    Readmission Risk Interventions No flowsheet data found.

## 2020-10-17 NOTE — Progress Notes (Signed)
Patient given instructions to keep/make follow up appointment, when to return for worsening symptoms, IV and tele discontinued, wheelchair given to patient, & discharged with all belongings via wheelchair.

## 2020-10-17 NOTE — Progress Notes (Signed)
Mobility Specialist - Progress Note   10/17/20 1200  Orthostatic Lying   BP- Lying 135/71  Orthostatic Sitting  BP- Sitting (!) 128/94  Orthostatic Standing at 0 minutes  BP- Standing at 0 minutes 139/78  Mobility  Activity Ambulated in room  Level of Assistance Minimal assist, patient does 75% or more  Assistive Device Front wheel walker  Distance Ambulated (ft) 25 ft  Mobility Ambulated with assistance in room  Mobility Response Tolerated well  Mobility performed by Mobility specialist  $Mobility charge 1 Mobility    Pt sleeping on arrival, easily awakened by voice. Utilizing RA. Orthostatics recorded above. Supervision to sit EOB. Pt initially unable to come into upright position on first 3 attempts, even from elevated bed height d/t pain in R ankle and L foot/toes. Pt encouraged to try shifting weight onto L heel resulting in successful attempt to stand with RW - modA. Pt ambulated in room with minA, no LOB. VC to stay close to RW. Returned supine with supervision, alarm set.    Filiberto Pinks Mobility Specialist 10/17/20, 12:48 PM

## 2020-10-17 NOTE — Care Management Important Message (Signed)
Important Message  Patient Details  Name: Jody Lowe MRN: [id number] Date of Birth: [birth year]   Medicare Important Message Given:  Yes     [name] 10/17/2020, 4:00 PM

## 2020-10-17 NOTE — Discharge Summary (Signed)
Jody Lowe at [facility] NAME: Jody Lowe    MR#:  [id number]  DATE OF BIRTH:  [birth year]  DATE OF ADMISSION:  10/14/2020 ADMITTING PHYSICIAN: Loletha Grayer, MD  DATE OF DISCHARGE: 10/17/2020  6:27 PM  PRIMARY CARE PHYSICIAN: Tomasita Morrow, MD    ADMISSION DIAGNOSIS:  Syncope and collapse [R55] Hyperthermia [R50.9] Leg pain [M79.606] Knee pain [M25.569] Elevated troponin [R77.8] Syncope [R55]  DISCHARGE DIAGNOSIS:  Principal Problem:   Syncope and collapse Active Problems:   Tobacco use disorder   Peripheral vascular disease (HCC)   Iron deficiency anemia due to chronic blood loss   Hypertension, benign   AKI (acute kidney injury) ([place])   Age-related osteoporosis without current pathological fracture   Esophagitis   Syncope   SECONDARY DIAGNOSIS:   Past Medical History:  Diagnosis Date  . Anemia   . Hypertension   . Peripheral artery disease ([place])   . Renal disorder     HOSPITAL COURSE:   Syncope and collapse likely secondary to pain or heatstroke.  Initially had a high temperature but after that did not have a high temperature.  MRI of the brain was negative.  Carotid ultrasound did show some plaque in the carotids.  Can follow-up with vascular surgery about follow-up with this.  I did not when I did get a CTA of the carotids secondary to vascular surgery doing an angiogram of the lower extremities.  Physical therapy recommended home with home health. Severe leg and knee pain.  History of peripheral vascular disease.  Dr. Lucky Cowboy to to the vascular lab and did an angiogram of her left lower extremity and had stents and angioplasties.  Please see his operative report.  Dr. Lucky Cowboy will bring back to the vascular lab next week to look at the right leg.  Patient on aspirin and Dr. Lucky Cowboy added Effient.  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  We will follow-up with orthopedic surgery as  outpatient.  Cholesterol medication changed to Lipitor. Drop in hemoglobin during the hospital course.  Repeat hemoglobin did come up to 9.6.  Patient's lab work consistent with iron deficiency anemia and B12 deficiency.  I did give IM B12 injections during the hospital course and 2 doses of IV Venofer.  Close watching of patient's hemoglobin as outpatient mediated secondary to iron deficiency anemia.  I am hoping with treating B12 that the patient's hemoglobin will come up.  Oral iron and B12 supplementation as outpatient. Acute kidney injury with creatinine of 1.45 on presentation and down to 1.06 even after angiogram. Elevated troponin secondary to demand ischemia on low-dose aspirin and Lipitor. Weakness and lives alone.  Home health set up   DISCHARGE CONDITIONS:   Satisfactory  CONSULTS OBTAINED:  Vascular surgery Cardiology  DRUG ALLERGIES:   Allergies  Allergen Reactions  . Gabapentin Other (See Comments)    Nervous, shaky  . Plavix [Clopidogrel Bisulfate] Rash    DISCHARGE MEDICATIONS:   Allergies as of 10/17/2020       Reactions   Gabapentin Other (See Comments)   Nervous, shaky   Plavix [clopidogrel Bisulfate] Rash        Medication List     STOP taking these medications    lovastatin 40 MG tablet Commonly known as: MEVACOR   naloxone 4 MG/0.1ML Liqd nasal spray kit Commonly known as: NARCAN   oxybutynin 10 MG 24 hr tablet Commonly known as: DITROPAN-XL   propranolol 20 MG tablet Commonly  known as: INDERAL   varenicline 0.5 MG tablet Commonly known as: CHANTIX   Vitamin D (Ergocalciferol) 1.25 MG (50000 UNIT) Caps capsule Commonly known as: DRISDOL       TAKE these medications    albuterol 108 (90 Base) MCG/ACT inhaler Commonly known as: VENTOLIN HFA Inhale 1-2 puffs into the lungs every 6 (six) hours as needed for wheezing or shortness of breath.   alendronate 70 MG tablet Commonly known as: FOSAMAX Take 70 mg by mouth once a week.    amitriptyline 25 MG tablet Commonly known as: ELAVIL Take 25 mg by mouth at bedtime.   aspirin EC 81 MG tablet Take 81 mg by mouth daily.   atorvastatin 20 MG tablet Commonly known as: LIPITOR Take 1 tablet (20 mg total) by mouth at bedtime.   D3-1000 25 MCG (1000 UT) capsule Generic drug: Cholecalciferol Take 1,000 Units by mouth daily.   diclofenac Sodium 1 % Gel Commonly known as: VOLTAREN Apply 2 g topically 4 (four) times daily.   esomeprazole 40 MG capsule Commonly known as: NEXIUM Take 40 mg by mouth daily.   ferrous sulfate 325 (65 FE) MG tablet Take 325 mg by mouth every other day.   FLUoxetine 40 MG capsule Commonly known as: PROZAC Take 2 capsules (80 mg total) by mouth daily. Take one capsule (40 mg) by mouth once daily for one month, then increase to two capsules (80 mg) thereafter What changed: how much to take   fluticasone 50 MCG/ACT nasal spray Commonly known as: FLONASE Place 1 spray into both nostrils daily.   ipratropium 0.03 % nasal spray Commonly known as: ATROVENT Place 2 sprays into both nostrils 3 (three) times daily.   meclizine 25 MG tablet Commonly known as: ANTIVERT Take 1 tablet (25 mg total) by mouth 3 (three) times daily as needed for dizziness or nausea.   nicotine 21 mg/24hr patch Commonly known as: NICODERM CQ - dosed in mg/24 hours One 21 mg patch chest wall daily (okay to substitute generic)   nystatin powder Commonly known as: MYCOSTATIN/NYSTOP Apply 1 application topically 2 (two) times daily.   Oxycodone HCl 10 MG Tabs Take 10 mg by mouth every 6 (six) hours as needed (pain).   polyethylene glycol 17 g packet Commonly known as: MIRALAX / GLYCOLAX Take 17 g by mouth daily as needed for mild constipation.   prasugrel 10 MG Tabs tablet Commonly known as: EFFIENT Take 1 tablet (10 mg total) by mouth daily. Start taking on: October 18, 2020   senna-docusate 8.6-50 MG tablet Commonly known as: Senokot-S Take 2 tablets  by mouth daily.   vitamin B-12 1000 MCG tablet Commonly known as: CYANOCOBALAMIN Take 1 tablet (1,000 mcg total) by mouth daily.               Durable Medical Equipment  (From admission, onward)           Start     Ordered   10/15/20 1403  For home use only DME standard manual wheelchair with seat cushion  Once       Comments: Patient suffers from arthritis which impairs their ability to perform daily activities like walking in the home.  A walker will not resolve issue with performing activities of daily living. A wheelchair will allow patient to safely perform daily activities. Patient can safely propel the wheelchair in the home or has a caregiver who can provide assistance. Length of need lifetime Accessories: elevating leg rests (ELRs), wheel locks, extensions and  anti-tippers.   10/15/20 1403             DISCHARGE INSTRUCTIONS:   Follow-up PMD 5 days Follow-up vascular surgery for right leg angiogram  If you experience worsening of your admission symptoms, develop shortness of breath, life threatening emergency, suicidal or homicidal thoughts you must seek medical attention immediately by calling 911 or calling your MD immediately  if symptoms less severe.  You Must read complete instructions/literature along with all the possible adverse reactions/side effects for all the Medicines you take and that have been prescribed to you. Take any new Medicines after you have completely understood and accept all the possible adverse reactions/side effects.   Please note  You were cared for by a hospitalist during your hospital stay. If you have any questions about your discharge medications or the care you received while you were in the hospital after you are discharged, you can call the unit and asked to speak with the hospitalist on call if the hospitalist that took care of you is not available. Once you are discharged, your primary care physician will handle any further  medical issues. Please note that NO REFILLS for any discharge medications will be authorized once you are discharged, as it is imperative that you return to your primary care physician (or establish a relationship with a primary care physician if you do not have one) for your aftercare needs so that they can reassess your need for medications and monitor your lab values.    Today   CHIEF COMPLAINT:   Chief Complaint  Patient presents with  . Heat Exposure  . Hyperthermia    HISTORY OF PRESENT ILLNESS:  Jody Lowe  is a 75 y.o. female came in with syncopal episode secondary to pain or heatstroke.   VITAL SIGNS:  Blood pressure (!) 152/82, pulse 79, temperature 98.2 F (36.8 C), resp. rate 17, height _0  (1.651 m), weight 85 kg, SpO2 96 %.  I/O:   Intake/Output Summary (Last 24 hours) at 10/17/2020 1854 Last data filed at 10/17/2020 1800 Gross per 24 hour  Intake 240 ml  Output 600 ml  Net -360 ml    PHYSICAL EXAMINATION:  GENERAL:  75 y.o.-year-old patient lying in the bed with no acute distress.  EYES: Pupils equal, round, reactive to light and accommodation. No scleral icterus.  HEENT: Head atraumatic, normocephalic. Oropharynx and nasopharynx clear.  LUNGS: Normal breath sounds bilaterally, no wheezing, rales,rhonchi or crepitation. No use of accessory muscles of respiration.  CARDIOVASCULAR: S1, S2 normal. No murmurs, rubs, or gallops.  ABDOMEN: Soft, non-tender.  EXTREMITIES: No pedal edema.  NEUROLOGIC: Cranial nerves II through XII are intact. Muscle strength 5/5 in all extremities. Sensation intact. Gait not checked.  PSYCHIATRIC: The patient is alert and oriented x 3.  SKIN: No obvious rash, lesion, or ulcer.  Small blister right foot  DATA REVIEW:   CBC Recent Labs  Lab 10/17/20 0453  WBC 9.7  HGB 9.6*  HCT 30.5*  PLT 381    Chemistries  Recent Labs  Lab 10/15/20 0250 10/16/20 0601 10/17/20 0453  NA 135   < > 135  K 3.6   < > 3.6  CL 107   <  > 104  CO2 23   < > 22  GLUCOSE 100*   < > 76  BUN 15   < > 10  CREATININE 1.24*   < > 1.06*  CALCIUM 8.5*   < > 8.8*  AST 24  --   --  ALT 11  --   --   ALKPHOS 86  --   --   BILITOT 0.7  --   --    < > = values in this interval not displayed.      Microbiology Results  Results for orders placed or performed during the hospital encounter of 10/14/20  Resp Panel by RT-PCR (Flu A&B, Covid) Nasopharyngeal Swab     Status: None   Collection Time: 10/14/20  5:45 PM   Specimen: Nasopharyngeal Swab; Nasopharyngeal(NP) swabs in vial transport medium  Result Value Ref Range Status   SARS Coronavirus 2 by RT PCR NEGATIVE NEGATIVE Final    Comment: (NOTE) SARS-CoV-2 target nucleic acids are NOT DETECTED.  The SARS-CoV-2 RNA is generally detectable in upper respiratory specimens during the acute phase of infection. The lowest concentration of SARS-CoV-2 viral copies this assay can detect is 138 copies/mL. A negative result does not preclude SARS-Cov-2 infection and should not be used as the sole basis for treatment or other patient management decisions. A negative result may occur with  improper specimen collection/handling, submission of specimen other than nasopharyngeal swab, presence of viral mutation(s) within the areas targeted by this assay, and inadequate number of viral copies(<138 copies/mL). A negative result must be combined with clinical observations, patient history, and epidemiological information. The expected result is Negative.  Fact Sheet for Patients:  EntrepreneurPulse.com.au  Fact Sheet for Healthcare Providers:  IncredibleEmployment.be  This test is no t yet approved or cleared by the [place] FDA and  has been authorized for detection and/or diagnosis of SARS-CoV-2 by FDA under an Emergency Use Authorization (EUA). This EUA will remain  in effect (meaning this test can be used) for the duration of the COVID-19  declaration under Section 564(b)(1) of the Act, 21 U.S.C.section 360bbb-3(b)(1), unless the authorization is terminated  or revoked sooner.       Influenza A by PCR NEGATIVE NEGATIVE Final   Influenza B by PCR NEGATIVE NEGATIVE Final    Comment: (NOTE) The Xpert Xpress SARS-CoV-2/FLU/RSV plus assay is intended as an aid in the diagnosis of influenza from Nasopharyngeal swab specimens and should not be used as a sole basis for treatment. Nasal washings and aspirates are unacceptable for Xpert Xpress SARS-CoV-2/FLU/RSV testing.  Fact Sheet for Patients: EntrepreneurPulse.com.au  Fact Sheet for Healthcare Providers: IncredibleEmployment.be  This test is not yet approved or cleared by the [place] FDA and has been authorized for detection and/or diagnosis of SARS-CoV-2 by FDA under an Emergency Use Authorization (EUA). This EUA will remain in effect (meaning this test can be used) for the duration of the COVID-19 declaration under Section 564(b)(1) of the Act, 21 U.S.C. section 360bbb-3(b)(1), unless the authorization is terminated or revoked.  Performed at [facility], [address] [place], [place] [address]   Blood culture (routine x 2)     Status: None (Preliminary result)   Collection Time: 10/14/20  7:59 PM   Specimen: BLOOD  Result Value Ref Range Status   Specimen Description BLOOD RIGHT ASSIST CONTROL  Final   Special Requests   Final    BOTTLES DRAWN AEROBIC AND ANAEROBIC Blood Culture results may not be optimal due to an inadequate volume of blood received in culture bottles   Culture   Final    NO GROWTH 3 DAYS Performed at [facility], [address] [place], [place] [address]    Report Status PENDING  Incomplete  Blood culture (routine x 2)  Status: None (Preliminary result)   Collection Time: 10/14/20  8:00 PM   Specimen: BLOOD  Result Value Ref Range Status   Specimen Description BLOOD LEFT  ASSIST CONTROL  Final   Special Requests   Final    BOTTLES DRAWN AEROBIC AND ANAEROBIC Blood Culture results may not be optimal due to an inadequate volume of blood received in culture bottles   Culture   Final    NO GROWTH 3 DAYS Performed at [facility], [address], [place], [place] [address]    Report Status PENDING  Incomplete  Resp Panel by RT-PCR (Flu A&B, Covid) Nasopharyngeal Swab     Status: None   Collection Time: 10/16/20 12:21 PM   Specimen: Nasopharyngeal Swab; Nasopharyngeal(NP) swabs in vial transport medium  Result Value Ref Range Status   SARS Coronavirus 2 by RT PCR NEGATIVE NEGATIVE Final    Comment: (NOTE) SARS-CoV-2 target nucleic acids are NOT DETECTED.  The SARS-CoV-2 RNA is generally detectable in upper respiratory specimens during the acute phase of infection. The lowest concentration of SARS-CoV-2 viral copies this assay can detect is 138 copies/mL. A negative result does not preclude SARS-Cov-2 infection and should not be used as the sole basis for treatment or other patient management decisions. A negative result may occur with  improper specimen collection/handling, submission of specimen other than nasopharyngeal swab, presence of viral mutation(s) within the areas targeted by this assay, and inadequate number of viral copies(<138 copies/mL). A negative result must be combined with clinical observations, patient history, and epidemiological information. The expected result is Negative.  Fact Sheet for Patients:  EntrepreneurPulse.com.au  Fact Sheet for Healthcare Providers:  IncredibleEmployment.be  This test is no t yet approved or cleared by the [place] FDA and  has been authorized for detection and/or diagnosis of SARS-CoV-2 by FDA under an Emergency Use Authorization (EUA). This EUA will remain  in effect (meaning this test can be used) for the duration of the COVID-19 declaration under  Section 564(b)(1) of the Act, 21 U.S.C.section 360bbb-3(b)(1), unless the authorization is terminated  or revoked sooner.       Influenza A by PCR NEGATIVE NEGATIVE Final   Influenza B by PCR NEGATIVE NEGATIVE Final    Comment: (NOTE) The Xpert Xpress SARS-CoV-2/FLU/RSV plus assay is intended as an aid in the diagnosis of influenza from Nasopharyngeal swab specimens and should not be used as a sole basis for treatment. Nasal washings and aspirates are unacceptable for Xpert Xpress SARS-CoV-2/FLU/RSV testing.  Fact Sheet for Patients: EntrepreneurPulse.com.au  Fact Sheet for Healthcare Providers: IncredibleEmployment.be  This test is not yet approved or cleared by the [place] FDA and has been authorized for detection and/or diagnosis of SARS-CoV-2 by FDA under an Emergency Use Authorization (EUA). This EUA will remain in effect (meaning this test can be used) for the duration of the COVID-19 declaration under Section 564(b)(1) of the Act, 21 U.S.C. section 360bbb-3(b)(1), unless the authorization is terminated or revoked.  Performed at [facility], [address] [place],  [address]     RADIOLOGY:  PERIPHERAL VASCULAR CATHETERIZATION  Result Date: 10/16/2020 See surgical note for result.      Management plans discussed with the patient, family and they are in agreement.  CODE STATUS:     Code Status Orders  (From admission, onward)           Start     Ordered   10/14/20 2148  Full code  Continuous  10/14/20 2153           Code Status History     Date Active Date Inactive Code Status Order ID Comments User Context   05/01/2019 1414 05/03/2019 2201 Full Code [id number]  Para Skeans, MD ED       TOTAL TIME TAKING CARE OF THIS PATIENT: 35 minutes.    Loletha Grayer M.D on 10/17/2020 at 6:54 PM   [facility]  CC: Primary care physician; Tomasita Morrow, MD

## 2020-10-17 NOTE — Discharge Instructions (Signed)
You may shower. Remove your groin dressing and gently clean with soap and water. Gently pat dry.

## 2020-10-19 ENCOUNTER — Other Ambulatory Visit: Payer: Self-pay

## 2020-10-19 ENCOUNTER — Observation Stay
Admission: EM | Admit: 2020-10-19 | Discharge: 2020-10-24 | Disposition: A | Discharge status: Skilled Nursing Facility | Payer: Medicare Other | Attending: Internal Medicine | Admitting: Internal Medicine

## 2020-10-19 DIAGNOSIS — R531 Weakness: Secondary | ICD-10-CM

## 2020-10-19 DIAGNOSIS — I10 Essential (primary) hypertension: Secondary | ICD-10-CM | POA: Diagnosis present

## 2020-10-19 DIAGNOSIS — F17210 Nicotine dependence, cigarettes, uncomplicated: Secondary | ICD-10-CM | POA: Insufficient documentation

## 2020-10-19 DIAGNOSIS — I739 Peripheral vascular disease, unspecified: Secondary | ICD-10-CM | POA: Diagnosis present

## 2020-10-19 DIAGNOSIS — Z7982 Long term (current) use of aspirin: Secondary | ICD-10-CM | POA: Diagnosis not present

## 2020-10-19 DIAGNOSIS — Z79899 Other long term (current) drug therapy: Secondary | ICD-10-CM | POA: Diagnosis not present

## 2020-10-19 DIAGNOSIS — I70213 Atherosclerosis of native arteries of extremities with intermittent claudication, bilateral legs: Secondary | ICD-10-CM | POA: Insufficient documentation

## 2020-10-19 DIAGNOSIS — T82598A Other mechanical complication of other cardiac and vascular devices and implants, initial encounter: Secondary | ICD-10-CM | POA: Diagnosis not present

## 2020-10-19 DIAGNOSIS — Y838 Other surgical procedures as the cause of abnormal reaction of the patient, or of later complication, without mention of misadventure at the time of the procedure: Secondary | ICD-10-CM | POA: Diagnosis not present

## 2020-10-19 DIAGNOSIS — D649 Anemia, unspecified: Secondary | ICD-10-CM | POA: Diagnosis present

## 2020-10-19 DIAGNOSIS — M79662 Pain in left lower leg: Secondary | ICD-10-CM | POA: Diagnosis present

## 2020-10-19 DIAGNOSIS — Z8673 Personal history of transient ischemic attack (TIA), and cerebral infarction without residual deficits: Secondary | ICD-10-CM | POA: Insufficient documentation

## 2020-10-19 DIAGNOSIS — Z20822 Contact with and (suspected) exposure to covid-19: Secondary | ICD-10-CM | POA: Diagnosis not present

## 2020-10-19 DIAGNOSIS — F32A Depression, unspecified: Secondary | ICD-10-CM | POA: Diagnosis present

## 2020-10-19 DIAGNOSIS — F17200 Nicotine dependence, unspecified, uncomplicated: Secondary | ICD-10-CM | POA: Diagnosis present

## 2020-10-19 DIAGNOSIS — E876 Hypokalemia: Secondary | ICD-10-CM | POA: Insufficient documentation

## 2020-10-19 DIAGNOSIS — I70223 Atherosclerosis of native arteries of extremities with rest pain, bilateral legs: Secondary | ICD-10-CM | POA: Insufficient documentation

## 2020-10-19 DIAGNOSIS — F329 Major depressive disorder, single episode, unspecified: Secondary | ICD-10-CM | POA: Diagnosis present

## 2020-10-19 LAB — BASIC METABOLIC PANEL
Anion gap: 7 (ref 5–15)
BUN: 10 mg/dL (ref 8–23)
CO2: 25 mmol/L (ref 22–32)
Calcium: 8.7 mg/dL — ABNORMAL LOW (ref 8.9–10.3)
Chloride: 103 mmol/L (ref 98–111)
Creatinine, Ser: 1.04 mg/dL — ABNORMAL HIGH (ref 0.44–1.00)
GFR, Estimated: 56 mL/min — ABNORMAL LOW (ref >60)
Glucose, Bld: 100 mg/dL — ABNORMAL HIGH (ref 70–99)
Potassium: 3.5 mmol/L (ref 3.5–5.1)
Sodium: 135 mmol/L (ref 135–145)

## 2020-10-19 LAB — CBC WITH DIFFERENTIAL/PLATELET
Abs Immature Granulocytes: 0.08 10*3/uL — ABNORMAL HIGH (ref 0.00–0.07)
Basophils Absolute: 0.1 10*3/uL (ref 0.0–0.1)
Basophils Relative: 1 %
Eosinophils Absolute: 0.2 10*3/uL (ref 0.0–0.5)
Eosinophils Relative: 2 %
HCT: 26 % — ABNORMAL LOW (ref 36.0–46.0)
Hemoglobin: 8.7 g/dL — ABNORMAL LOW (ref 12.0–15.0)
Immature Granulocytes: 1 %
Lymphocytes Relative: 12 %
Lymphs Abs: 1.6 10*3/uL (ref 0.7–4.0)
MCH: 27.7 pg (ref 26.0–34.0)
MCHC: 33.5 g/dL (ref 30.0–36.0)
MCV: 82.8 fL (ref 80.0–100.0)
Monocytes Absolute: 1.5 10*3/uL — ABNORMAL HIGH (ref 0.1–1.0)
Monocytes Relative: 11 %
Neutro Abs: 9.4 10*3/uL — ABNORMAL HIGH (ref 1.7–7.7)
Neutrophils Relative %: 73 %
Platelets: 424 10*3/uL — ABNORMAL HIGH (ref 150–400)
RBC: 3.14 MIL/uL — ABNORMAL LOW (ref 3.87–5.11)
RDW: 16.7 % — ABNORMAL HIGH (ref 11.5–15.5)
WBC: 12.8 10*3/uL — ABNORMAL HIGH (ref 4.0–10.5)
nRBC: 0 % (ref 0.0–0.2)

## 2020-10-19 LAB — CULTURE, BLOOD (ROUTINE X 2)
Culture: NO GROWTH
Culture: NO GROWTH

## 2020-10-19 NOTE — ED Notes (Signed)
First nurse- pt brought in via ems with bil leg pain .  Per ems , pt had leg stents placed last week.  Pt unable to get around at home and has increased pain.  Bp130/79,p-106,

## 2020-10-19 NOTE — ED Provider Notes (Signed)
[facility] Emergency Department Provider Note   ____________________________________________   Event Date/Time   First MD Initiated Contact with Patient 10/19/20 2216     (approximate)  I have reviewed the triage vital signs and the nursing notes.   HISTORY  Chief Complaint Placement Problem    HPI Jody Lowe is a 75 y.o. female with past medical history of hypertension, peripheral vascular disease, and osteoarthritis who presents to the ED complaining of generalized weakness and leg pain.  Patient reports that she was discharged from the hospital 2 days ago and has been unable to walk since then.  She states that she fell trying to get into the car to go home following admission for altered mental status and peripheral vascular disease requiring angioplasty and stent placement.  She continues to have pain in her legs and has been severely limited in her mobility.  She lives alone and typically gets around using a walker, but family has had to assist her with all ADLs for the past 2 days.  They are concerned that she needs to be placed in a skilled nursing facility and so were advised by the home health agency to come back to the ED.        Past Medical History:  Diagnosis Date   Anemia    Hypertension    Peripheral artery disease (HCC)    Renal disorder     Patient Active Problem List   Diagnosis Date Noted   Generalized weakness 10/20/2020   Weakness    Syncope 10/16/2020   Drop in hemoglobin    B12 deficiency    Elevated troponin    Pain in both lower extremities    Chronic pain of both knees    Syncope and collapse 10/14/2020   Anemia 10/14/2020   AKI (acute kidney injury) (HCC) 10/14/2020   TIA (transient ischemic attack) 05/01/2019   Age-related osteoporosis without current pathological fracture 06/27/2016   Tobacco use disorder 05/09/2015   Iron deficiency anemia due to chronic blood loss 03/15/2009   Peripheral vascular disease  (HCC) 07/10/2005   Hypertension, benign 03/29/2003   Depressive disorder 10/06/2001   B-complex deficiency 05/01/1995   Esophagitis 08/18/1990    Past Surgical History:  Procedure Laterality Date   BREAST REDUCTION SURGERY     CATARACT EXTRACTION W/PHACO Left 11/11/2019   Procedure: CATARACT EXTRACTION PHACO AND INTRAOCULAR LENS PLACEMENT (IOC) LEFT VISION BLUE;  Surgeon: Elliot Cousin, MD;  Location: [facility];  Service: Ophthalmology;  Laterality: Left;  7.94 0:42.4   CATARACT EXTRACTION W/PHACO Right 12/23/2019   Procedure: CATARACT EXTRACTION PHACO AND INTRAOCULAR LENS PLACEMENT (IOC) RIGHT VISION BLUE 5.47  00:31.1;  Surgeon: Elliot Cousin, MD;  Location: [facility];  Service: Ophthalmology;  Laterality: Right;   LOWER EXTREMITY ANGIOGRAPHY N/A 10/16/2020   Procedure: Lower Extremity Angiography;  Surgeon: Annice Needy, MD;  Location: [facility] IN[facility];  Service: Cardiovascular;  Laterality: N/A;    Prior to Admission medications   Medication Sig Start Date End Date Taking? Authorizing Provider  albuterol (VENTOLIN HFA) 108 (90 Base) MCG/ACT inhaler Inhale 1-2 puffs into the lungs every 6 (six) hours as needed for wheezing or shortness of breath.   Yes [provider]  alendronate (FOSAMAX) 70 MG tablet Take 70 mg by mouth once a week. 04/20/19  Yes [provider]  amitriptyline (ELAVIL) 25 MG tablet Take 25 mg by mouth at bedtime.   Yes [provider]  aspirin EC 81  MG tablet Take 81 mg by mouth daily.   Yes [provider]  atorvastatin (LIPITOR) 20 MG tablet Take 1 tablet (20 mg total) by mouth at bedtime. 10/17/20  Yes Wieting, Richard, MD  Cholecalciferol (D3-1000) 25 MCG (1000 UT) capsule Take 1,000 Units by mouth daily.   Yes [provider]  diclofenac Sodium (VOLTAREN) 1 % GEL Apply 2 g topically 4 (four) times daily.   Yes [provider]  esomeprazole (NEXIUM) 40 MG capsule Take 40 mg by mouth daily.    Yes [provider]  ferrous sulfate 325 (65 FE) MG tablet Take 325 mg by mouth every other day.   Yes [provider]  FLUoxetine (PROZAC) 40 MG capsule Take 2 capsules (80 mg total) by mouth daily. Take one capsule (40 mg) by mouth once daily for one month, then increase to two capsules (80 mg) thereafter 10/17/20  Yes Wieting, Richard, MD  fluticasone (FLONASE) 50 MCG/ACT nasal spray Place 1 spray into both nostrils daily.   Yes [provider]  ipratropium (ATROVENT) 0.03 % nasal spray Place 2 sprays into both nostrils 3 (three) times daily.   Yes [provider]  meclizine (ANTIVERT) 25 MG tablet Take 1 tablet (25 mg total) by mouth 3 (three) times daily as needed for dizziness or nausea. 09/20/19  Yes Emily Filbert, MD  nicotine (NICODERM CQ - DOSED IN MG/24 HOURS) 21 mg/24hr patch One 21 mg patch chest wall daily (okay to substitute generic) 10/17/20  Yes Wieting, Richard, MD  nystatin (MYCOSTATIN/NYSTOP) powder Apply 1 application topically 2 (two) times daily.   Yes [provider]  Oxycodone HCl 10 MG TABS Take 10 mg by mouth every 6 (six) hours as needed (pain).   Yes [provider]  polyethylene glycol (MIRALAX / GLYCOLAX) 17 g packet Take 17 g by mouth daily as needed for mild constipation.   Yes [provider]  prasugrel (EFFIENT) 10 MG TABS tablet Take 1 tablet (10 mg total) by mouth daily. 10/18/20  Yes Wieting, Richard, MD  senna-docusate (SENOKOT-S) 8.6-50 MG tablet Take 2 tablets by mouth daily.   Yes [provider]  vitamin B-12 (CYANOCOBALAMIN) 1000 MCG tablet Take 1 tablet (1,000 mcg total) by mouth daily. 10/17/20  Yes Wieting, Richard, MD    Allergies Gabapentin and Plavix [clopidogrel bisulfate]  Family History  Problem Relation Age of Onset   Hypertension Sister     Social History Social History   Tobacco Use   Smoking status: Every Day    Packs/day: 0.50    Types: Cigarettes    Smokeless tobacco: Never  Substance Use Topics   Alcohol use: No   Drug use: No    Review of Systems  Constitutional: No fever/chills.  Positive for generalized weakness. Eyes: No visual changes. ENT: No sore throat. Cardiovascular: Denies chest pain. Respiratory: Denies shortness of breath. Gastrointestinal: No abdominal pain.  No nausea, no vomiting.  No diarrhea.  No constipation. Genitourinary: Negative for dysuria. Musculoskeletal: Negative for back pain.  Positive for leg pain. Skin: Negative for rash. Neurological: Negative for headaches, focal weakness or numbness.  ____________________________________________   PHYSICAL EXAM:  VITAL SIGNS: ED Triage Vitals [10/19/20 1757]  Enc Vitals Group     BP (!) 154/70     Pulse Rate 90     Resp 16     Temp 98.7 F (37.1 C)     Temp Source Oral     SpO2 99 %  Weight 218 lb (98.9 kg)     Height 5\' 6"  (1.676 m)     Head Circumference      Peak Flow      Pain Score 0     Pain Loc      Pain Edu?      Excl. in GC?     Constitutional: Alert and oriented. Eyes: Conjunctivae are normal. Head: Atraumatic. Nose: No congestion/rhinnorhea. Mouth/Throat: Mucous membranes are moist. Neck: Normal ROM Cardiovascular: Normal rate, regular rhythm. Grossly normal heart sounds.  2+ DP pulses bilaterally. Respiratory: Normal respiratory effort.  No retractions. Lungs CTAB. Gastrointestinal: Soft and nontender. No distention. Genitourinary: deferred Musculoskeletal: No lower extremity tenderness nor edema. Neurologic:  Normal speech and language. No gross focal neurologic deficits are appreciated. Skin:  Skin is warm, dry and intact. No rash noted. Psychiatric: Mood and affect are normal. Speech and behavior are normal.  ____________________________________________   LABS (all labs ordered are listed, but only abnormal results are displayed)  Labs Reviewed  CBC WITH DIFFERENTIAL/PLATELET - Abnormal; Notable for the  following components:      Result Value   WBC 12.8 (*)    RBC 3.14 (*)    Hemoglobin 8.7 (*)    HCT 26.0 (*)    RDW 16.7 (*)    Platelets 424 (*)    Neutro Abs 9.4 (*)    Monocytes Absolute 1.5 (*)    Abs Immature Granulocytes 0.08 (*)    All other components within normal limits  BASIC METABOLIC PANEL - Abnormal; Notable for the following components:   Glucose, Bld 100 (*)    Creatinine, Ser 1.04 (*)    Calcium 8.7 (*)    GFR, Estimated 56 (*)    All other components within normal limits  RESP PANEL BY RT-PCR (FLU A&B, COVID) [id number]  BASIC METABOLIC PANEL  CBC   ____________________________________________  EKG  ED ECG REPORT I, , the attending physician, personally viewed and interpreted this ECG.   Date: 10/19/2020  EKG Time: 23:23  Rate: 81  Rhythm: normal sinus rhythm  Axis: Normal  Intervals:none  ST&T Change: None   PROCEDURES  Procedure(s) performed (including Critical Care):  Procedures   ____________________________________________   INITIAL IMPRESSION / ASSESSMENT AND PLAN / ED COURSE      75 year old female with past medical history of hypertension, peripheral vascular disease and osteoarthritis who presents to the ED complaining of generalized weakness and ongoing leg pain following recent admission to the hospital.  She lives alone and has been unable to ambulate on her own.  She had previous angioplasty for peripheral vascular disease, pulses intact at this time to her lower extremities.  Repeat labs are not significantly changed from previous, given her inability to walk, case discussed with hospitalist for admission.      ____________________________________________   FINAL CLINICAL IMPRESSION(S) / ED DIAGNOSES  Final diagnoses:  Generalized weakness  Peripheral vascular disease [facility])     ED Discharge Orders     None        Note:  This document was prepared using Dragon voice recognition software and may  include unintentional dictation errors.    [facility], MD 10/20/20 [phone]

## 2020-10-19 NOTE — ED Triage Notes (Signed)
Pt presents to the ED for placement to assisted living facility. Pt states that she was recently admitted to the hospital and discharged home. Pt states that she is unable to care for herself at home and requesting placement.

## 2020-10-19 NOTE — ED Notes (Signed)
ED Provider at bedside. 

## 2020-10-20 ENCOUNTER — Encounter: Payer: Self-pay | Admitting: Internal Medicine

## 2020-10-20 DIAGNOSIS — R531 Weakness: Secondary | ICD-10-CM | POA: Diagnosis not present

## 2020-10-20 LAB — BASIC METABOLIC PANEL
Anion gap: 7 (ref 5–15)
BUN: 10 mg/dL (ref 8–23)
CO2: 25 mmol/L (ref 22–32)
Calcium: 8.5 mg/dL — ABNORMAL LOW (ref 8.9–10.3)
Chloride: 102 mmol/L (ref 98–111)
Creatinine, Ser: 0.86 mg/dL (ref 0.44–1.00)
GFR, Estimated: >60 mL/min (ref >60)
Glucose, Bld: 85 mg/dL (ref 70–99)
Potassium: 3.4 mmol/L — ABNORMAL LOW (ref 3.5–5.1)
Sodium: 134 mmol/L — ABNORMAL LOW (ref 135–145)

## 2020-10-20 LAB — CBC
HCT: 23.4 % — ABNORMAL LOW (ref 36.0–46.0)
Hemoglobin: 7.8 g/dL — ABNORMAL LOW (ref 12.0–15.0)
MCH: 27.3 pg (ref 26.0–34.0)
MCHC: 33.3 g/dL (ref 30.0–36.0)
MCV: 81.8 fL (ref 80.0–100.0)
Platelets: 380 10*3/uL (ref 150–400)
RBC: 2.86 MIL/uL — ABNORMAL LOW (ref 3.87–5.11)
RDW: 16.8 % — ABNORMAL HIGH (ref 11.5–15.5)
WBC: 11.7 10*3/uL — ABNORMAL HIGH (ref 4.0–10.5)
nRBC: 0 % (ref 0.0–0.2)

## 2020-10-20 LAB — RESP PANEL BY RT-PCR (FLU A&B, COVID) ARPGX2
Influenza A by PCR: NEGATIVE
Influenza B by PCR: NEGATIVE
SARS Coronavirus 2 by RT PCR: NEGATIVE

## 2020-10-20 MED ORDER — ASPIRIN EC 81 MG PO TBEC
81.0000 mg | DELAYED_RELEASE_TABLET | Freq: Every day | ORAL | Status: DC
  Administered 2020-10-20 – 2020-10-24 (×5): 81 mg via ORAL
  Filled 2020-10-20 (×5): qty 1

## 2020-10-20 MED ORDER — VITAMIN B-12 1000 MCG PO TABS
1000.0000 ug | ORAL_TABLET | Freq: Every day | ORAL | Status: DC
  Administered 2020-10-20 – 2020-10-24 (×5): 1000 ug via ORAL
  Filled 2020-10-20 (×6): qty 1

## 2020-10-20 MED ORDER — ATORVASTATIN CALCIUM 20 MG PO TABS
20.0000 mg | ORAL_TABLET | Freq: Every day | ORAL | Status: DC
  Administered 2020-10-20 – 2020-10-23 (×5): 20 mg via ORAL
  Filled 2020-10-20 (×5): qty 1

## 2020-10-20 MED ORDER — ALBUTEROL SULFATE HFA 108 (90 BASE) MCG/ACT IN AERS: 1.0000 | INHALATION_SPRAY | Freq: Four times a day (QID) | RESPIRATORY_TRACT | Status: DC | PRN

## 2020-10-20 MED ORDER — POTASSIUM CHLORIDE CRYS ER 20 MEQ PO TBCR
20.0000 meq | EXTENDED_RELEASE_TABLET | Freq: Once | ORAL | Status: AC
  Administered 2020-10-20: 20 meq via ORAL
  Filled 2020-10-20: qty 1

## 2020-10-20 MED ORDER — FLUTICASONE PROPIONATE 50 MCG/ACT NA SUSP
1.0000 | Freq: Every day | NASAL | Status: DC | PRN
  Filled 2020-10-20: qty 16

## 2020-10-20 MED ORDER — FERROUS SULFATE 325 (65 FE) MG PO TABS
325.0000 mg | ORAL_TABLET | ORAL | Status: DC
  Administered 2020-10-20 – 2020-10-24 (×4): 325 mg via ORAL
  Filled 2020-10-20 (×4): qty 1

## 2020-10-20 MED ORDER — NYSTATIN 100000 UNIT/GM EX POWD
1.0000 "application " | Freq: Two times a day (BID) | CUTANEOUS | Status: DC
  Administered 2020-10-20 – 2020-10-23 (×5): 1 via TOPICAL
  Filled 2020-10-20: qty 15

## 2020-10-20 MED ORDER — OXYCODONE HCL 5 MG PO TABS
10.0000 mg | ORAL_TABLET | Freq: Four times a day (QID) | ORAL | Status: DC | PRN
Start: 2020-10-20 — End: 2020-10-24
  Administered 2020-10-20 – 2020-10-24 (×4): 10 mg via ORAL
  Filled 2020-10-20 (×4): qty 2

## 2020-10-20 MED ORDER — SENNOSIDES-DOCUSATE SODIUM 8.6-50 MG PO TABS
2.0000 | ORAL_TABLET | Freq: Every day | ORAL | Status: DC
  Administered 2020-10-20 – 2020-10-22 (×3): 2 via ORAL
  Filled 2020-10-20 (×4): qty 2

## 2020-10-20 MED ORDER — ACETAMINOPHEN 650 MG RE SUPP: 650.0000 mg | Freq: Four times a day (QID) | RECTAL | Status: DC | PRN

## 2020-10-20 MED ORDER — IPRATROPIUM BROMIDE 0.03 % NA SOLN
2.0000 | Freq: Three times a day (TID) | NASAL | Status: DC | PRN
  Filled 2020-10-20: qty 30

## 2020-10-20 MED ORDER — ONDANSETRON HCL 4 MG PO TABS: 4.0000 mg | ORAL_TABLET | Freq: Four times a day (QID) | ORAL | Status: DC | PRN

## 2020-10-20 MED ORDER — MECLIZINE HCL 25 MG PO TABS
25.0000 mg | ORAL_TABLET | Freq: Three times a day (TID) | ORAL | Status: DC | PRN
  Filled 2020-10-20: qty 1

## 2020-10-20 MED ORDER — PANTOPRAZOLE SODIUM 40 MG PO TBEC
40.0000 mg | DELAYED_RELEASE_TABLET | Freq: Every day | ORAL | Status: DC
  Administered 2020-10-20 – 2020-10-24 (×5): 40 mg via ORAL
  Filled 2020-10-20 (×5): qty 1

## 2020-10-20 MED ORDER — SODIUM CHLORIDE 0.9% FLUSH: 3.0000 mL | INTRAVENOUS | Status: DC | PRN

## 2020-10-20 MED ORDER — SODIUM CHLORIDE 0.9 % IV SOLN: 250.0000 mL | INTRAVENOUS | Status: DC | PRN

## 2020-10-20 MED ORDER — NICOTINE 21 MG/24HR TD PT24
21.0000 mg | MEDICATED_PATCH | Freq: Every day | TRANSDERMAL | Status: DC
Start: 2020-10-20 — End: 2020-10-24
  Filled 2020-10-20 (×4): qty 1

## 2020-10-20 MED ORDER — POLYETHYLENE GLYCOL 3350 17 G PO PACK: 17.0000 g | PACK | Freq: Every day | ORAL | Status: DC | PRN

## 2020-10-20 MED ORDER — PRASUGREL HCL 10 MG PO TABS
10.0000 mg | ORAL_TABLET | Freq: Every day | ORAL | Status: DC
  Administered 2020-10-20 – 2020-10-24 (×5): 10 mg via ORAL
  Filled 2020-10-20 (×5): qty 1

## 2020-10-20 MED ORDER — ENOXAPARIN SODIUM 40 MG/0.4ML IJ SOSY: 40.0000 mg | PREFILLED_SYRINGE | INTRAMUSCULAR | Status: DC

## 2020-10-20 MED ORDER — AMITRIPTYLINE HCL 25 MG PO TABS
25.0000 mg | ORAL_TABLET | Freq: Every day | ORAL | Status: DC
  Administered 2020-10-20 – 2020-10-23 (×5): 25 mg via ORAL
  Filled 2020-10-20 (×6): qty 1

## 2020-10-20 MED ORDER — VITAMIN D 25 MCG (1000 UNIT) PO TABS
1000.0000 [IU] | ORAL_TABLET | Freq: Every day | ORAL | Status: DC
  Administered 2020-10-20 – 2020-10-24 (×5): 1000 [IU] via ORAL
  Filled 2020-10-20 (×5): qty 1

## 2020-10-20 MED ORDER — ACETAMINOPHEN 325 MG PO TABS
650.0000 mg | ORAL_TABLET | Freq: Four times a day (QID) | ORAL | Status: DC | PRN
  Administered 2020-10-20: 650 mg via ORAL
  Filled 2020-10-20: qty 2

## 2020-10-20 MED ORDER — ONDANSETRON HCL 4 MG/2ML IJ SOLN: 4.0000 mg | Freq: Four times a day (QID) | INTRAMUSCULAR | Status: DC | PRN

## 2020-10-20 MED ORDER — ALBUTEROL SULFATE (2.5 MG/3ML) 0.083% IN NEBU: 2.5000 mg | INHALATION_SOLUTION | Freq: Four times a day (QID) | RESPIRATORY_TRACT | Status: DC | PRN

## 2020-10-20 MED ORDER — FLUOXETINE HCL 20 MG PO CAPS
80.0000 mg | ORAL_CAPSULE | Freq: Every day | ORAL | Status: DC
  Administered 2020-10-20 – 2020-10-24 (×5): 80 mg via ORAL
  Filled 2020-10-20 (×5): qty 4

## 2020-10-20 MED ORDER — SODIUM CHLORIDE 0.9% FLUSH
3.0000 mL | Freq: Two times a day (BID) | INTRAVENOUS | Status: DC
  Administered 2020-10-20 – 2020-10-23 (×6): 3 mL via INTRAVENOUS

## 2020-10-20 NOTE — ED Notes (Signed)
ED Provider at bedside. 

## 2020-10-20 NOTE — Progress Notes (Signed)
PROGRESS NOTE    Jody Lowe  BJS:[id number] DOB: 1946-01-16 DOA: 10/19/2020 PCP: Idelia Salm, MD    Assessment & Plan:   Principal Problem:   Generalized weakness Active Problems:   Tobacco use disorder   Peripheral vascular disease (HCC)   Hypertension, benign   Depressive disorder   Anemia   Generalized weakness: likely secondary to physical deconditioning following recent hospitalization. High fall risk. Difficulty taking care of herself at home. PT/OT consulted    PVD: s/p recent stent angioplasty to the left lower extremity. Continue on effient, aspirin, statin   Depression: severity unknown. Continue on home dose of fluoxetine    Obesity: BMI 35.1. Complicates overall care and prognosis    Nicotine dependence: nicotine patch to prevent w/drawal. Received smoking cessation counseling    ACD: H&H are trending down. Will transfuse if Hb <7.0  Hypokalemia: KCl repleated   DVT prophylaxis:  lovenox  Code Status: full  Family Communication:  Disposition Plan: likely d/c to SNF, awaiting PT/OT recs  Level of care: Med-Surg  Status is: Observation  The patient will require care spanning > 2 midnights and should be moved to inpatient because: Unsafe d/c plan  Dispo: The patient is from: Home              Anticipated d/c is to: SNF              Patient currently is not medically stable to d/c.   Difficult to place patient Yes    Consultants:    Procedures:   Antimicrobials:    Subjective: Pt c/o weakness  Objective: Vitals:   10/19/20 1757 10/19/20 2309 10/20/20 0550 10/20/20 0813  BP: (!) 154/70 (!) 147/59 132/61 136/70  Pulse: 90 83 76 78  Resp: 16 20 16 18   Temp: 98.7 F (37.1 C)  98.6 F (37 C) 98.8 F (37.1 C)  TempSrc: Oral  Oral Oral  SpO2: 99% 97% 98% 96%  Weight: 98.9 kg     Height: 5\' 6"  (1.676 m)      No intake or output data in the 24 hours ending 10/20/20 0828 Filed Weights   10/19/20 1757  Weight: 98.9 kg     Examination:  General exam: Appears calm and comfortable  Respiratory system: Clear to auscultation. Respiratory effort normal. Cardiovascular system: S1 & S2 +.  No  rubs, gallops or clicks.  Gastrointestinal system: Abdomen is nondistended, soft and nontender. Normal bowel sounds heard. Central nervous system: Alert and oriented. Moves all extremities  Psychiatry: Judgement and insight appear normal. Mood & affect appropriate.     Data Reviewed: I have personally reviewed following labs and imaging studies  CBC: Recent Labs  Lab 10/14/20 1745 10/15/20 0250 10/16/20 0601 10/17/20 0453 10/19/20 2313 10/20/20 0654  WBC 6.2 10.9* 8.6 9.7 12.8* 11.7*  NEUTROABS 3.4  --   --  7.3 9.4*  --   HGB 9.8* 9.1* 7.9* 9.6* 8.7* 7.8*  HCT 29.4* 27.8* 24.2* 30.5* 26.0* 23.4*  MCV 81.4 82.7 83.4 85.9 82.8 81.8  PLT 488* 407* 362 381 424* 380   Basic Metabolic Panel: Recent Labs  Lab 10/15/20 0250 10/16/20 0601 10/17/20 0453 10/19/20 2313 10/20/20 0654  NA 135 133* 135 135 134*  K 3.6 3.5 3.6 3.5 3.4*  CL 107 107 104 103 102  CO2 23 21* 22 25 25   GLUCOSE 100* 87 76 100* 85  BUN 15 11 10 10 10   CREATININE 1.24* 1.18* 1.06* 1.04* 0.86  CALCIUM 8.5*  8.3* 8.8* 8.7* 8.5*   GFR: Estimated Creatinine Clearance: 66 mL/min (by C-G formula based on SCr of 0.86 mg/dL). Liver Function Tests: Recent Labs  Lab 10/15/20 0250  AST 24  ALT 11  ALKPHOS 86  BILITOT 0.7  PROT 6.3*  ALBUMIN 2.9*   No results for input(s): LIPASE, AMYLASE in the last 168 hours. No results for input(s): AMMONIA in the last 168 hours. Coagulation Profile: Recent Labs  Lab 10/14/20 2254  INR 1.2   Cardiac Enzymes: Recent Labs  Lab 10/14/20 1745  CKTOTAL 65   BNP (last 3 results) No results for input(s): PROBNP in the last 8760 hours. HbA1C: No results for input(s): HGBA1C in the last 72 hours. CBG: No results for input(s): GLUCAP in the last 168 hours. Lipid Profile: No results for  input(s): CHOL, HDL, LDLCALC, TRIG, CHOLHDL, LDLDIRECT in the last 72 hours. Thyroid Function Tests: No results for input(s): TSH, T4TOTAL, FREET4, T3FREE, THYROIDAB in the last 72 hours. Anemia Panel: No results for input(s): VITAMINB12, FOLATE, FERRITIN, TIBC, IRON, RETICCTPCT in the last 72 hours. Sepsis Labs: Recent Labs  Lab 10/14/20 1745 10/14/20 1959  LATICACIDVEN 2.3* 1.1    Recent Results (from the past 240 hour(s))  Resp Panel by RT-PCR (Flu A&B, Covid) Nasopharyngeal Swab     Status: None   Collection Time: 10/14/20  5:45 PM   Specimen: Nasopharyngeal Swab; Nasopharyngeal(NP) swabs in vial transport medium  Result Value Ref Range Status   SARS Coronavirus 2 by RT PCR NEGATIVE NEGATIVE Final    Comment: (NOTE) SARS-CoV-2 target nucleic acids are NOT DETECTED.  The SARS-CoV-2 RNA is generally detectable in upper respiratory specimens during the acute phase of infection. The lowest concentration of SARS-CoV-2 viral copies this assay can detect is 138 copies/mL. A negative result does not preclude SARS-Cov-2 infection and should not be used as the sole basis for treatment or other patient management decisions. A negative result may occur with  improper specimen collection/handling, submission of specimen other than nasopharyngeal swab, presence of viral mutation(s) within the areas targeted by this assay, and inadequate number of viral copies(<138 copies/mL). A negative result must be combined with clinical observations, patient history, and epidemiological information. The expected result is Negative.  Fact Sheet for Patients:  BloggerCourse.comhttps://www.fda.gov/media/152166/download  Fact Sheet for Healthcare Providers:  SeriousBroker.ithttps://www.fda.gov/media/152162/download  This test is no t yet approved or cleared by the [place] FDA and  has been authorized for detection and/or diagnosis of SARS-CoV-2 by FDA under an Emergency Use Authorization (EUA). This EUA will remain  in effect  (meaning this test can be used) for the duration of the COVID-19 declaration under Section 564(b)(1) of the Act, 21 U.S.C.section 360bbb-3(b)(1), unless the authorization is terminated  or revoked sooner.       Influenza A by PCR NEGATIVE NEGATIVE Final   Influenza B by PCR NEGATIVE NEGATIVE Final    Comment: (NOTE) The Xpert Xpress SARS-CoV-2/FLU/RSV plus assay is intended as an aid in the diagnosis of influenza from Nasopharyngeal swab specimens and should not be used as a sole basis for treatment. Nasal washings and aspirates are unacceptable for Xpert Xpress SARS-CoV-2/FLU/RSV testing.  Fact Sheet for Patients: BloggerCourse.comhttps://www.fda.gov/media/152166/download  Fact Sheet for Healthcare Providers: SeriousBroker.ithttps://www.fda.gov/media/152162/download  This test is not yet approved or cleared by the [place] FDA and has been authorized for detection and/or diagnosis of SARS-CoV-2 by FDA under an Emergency Use Authorization (EUA). This EUA will remain in effect (meaning this test can be used) for the duration of  the COVID-19 declaration under Section 564(b)(1) of the Act, 21 U.S.C. section 360bbb-3(b)(1), unless the authorization is terminated or revoked.  Performed at [facility], [address] [place], [place] [address]   Blood culture (routine x 2)     Status: None   Collection Time: 10/14/20  7:59 PM   Specimen: BLOOD  Result Value Ref Range Status   Specimen Description BLOOD RIGHT ASSIST CONTROL  Final   Special Requests   Final    BOTTLES DRAWN AEROBIC AND ANAEROBIC Blood Culture results may not be optimal due to an inadequate volume of blood received in culture bottles   Culture   Final    NO GROWTH 5 DAYS Performed at [facility], [address] [place], [place] [address]    Report Status 10/19/2020 FINAL  Final  Blood culture (routine x 2)     Status: None   Collection Time: 10/14/20  8:00 PM   Specimen: BLOOD  Result Value Ref Range Status    Specimen Description BLOOD LEFT ASSIST CONTROL  Final   Special Requests   Final    BOTTLES DRAWN AEROBIC AND ANAEROBIC Blood Culture results may not be optimal due to an inadequate volume of blood received in culture bottles   Culture   Final    NO GROWTH 5 DAYS Performed at [facility], [address] [place], [place] [address]    Report Status 10/19/2020 FINAL  Final  Resp Panel by RT-PCR (Flu A&B, Covid) Nasopharyngeal Swab     Status: None   Collection Time: 10/16/20 12:21 PM   Specimen: Nasopharyngeal Swab; Nasopharyngeal(NP) swabs in vial transport medium  Result Value Ref Range Status   SARS Coronavirus 2 by RT PCR NEGATIVE NEGATIVE Final    Comment: (NOTE) SARS-CoV-2 target nucleic acids are NOT DETECTED.  The SARS-CoV-2 RNA is generally detectable in upper respiratory specimens during the acute phase of infection. The lowest concentration of SARS-CoV-2 viral copies this assay can detect is 138 copies/mL. A negative result does not preclude SARS-Cov-2 infection and should not be used as the sole basis for treatment or other patient management decisions. A negative result may occur with  improper specimen collection/handling, submission of specimen other than nasopharyngeal swab, presence of viral mutation(s) within the areas targeted by this assay, and inadequate number of viral copies(<138 copies/mL). A negative result must be combined with clinical observations, patient history, and epidemiological information. The expected result is Negative.  Fact Sheet for Patients:  BloggerCourse.com  Fact Sheet for Healthcare Providers:  SeriousBroker.it  This test is no t yet approved or cleared by the [place] FDA and  has been authorized for detection and/or diagnosis of SARS-CoV-2 by FDA under an Emergency Use Authorization (EUA). This EUA will remain  in effect (meaning this test can be used) for the duration  of the COVID-19 declaration under Section 564(b)(1) of the Act, 21 U.S.C.section 360bbb-3(b)(1), unless the authorization is terminated  or revoked sooner.       Influenza A by PCR NEGATIVE NEGATIVE Final   Influenza B by PCR NEGATIVE NEGATIVE Final    Comment: (NOTE) The Xpert Xpress SARS-CoV-2/FLU/RSV plus assay is intended as an aid in the diagnosis of influenza from Nasopharyngeal swab specimens and should not be used as a sole basis for treatment. Nasal washings and aspirates are unacceptable for Xpert Xpress SARS-CoV-2/FLU/RSV testing.  Fact Sheet for Patients: BloggerCourse.com  Fact Sheet for Healthcare Providers: SeriousBroker.it  This test is not yet approved or cleared  by the [place] and has been authorized for detection and/or diagnosis of SARS-CoV-2 by FDA under an Emergency Use Authorization (EUA). This EUA will remain in effect (meaning this test can be used) for the duration of the COVID-19 declaration under Section 564(b)(1) of the Act, 21 U.S.C. section 360bbb-3(b)(1), unless the authorization is terminated or revoked.  Performed at [facility], [address] [place], [place] [address]   Resp Panel by RT-PCR (Flu A&B, Covid) Nasopharyngeal Swab     Status: None   Collection Time: 10/20/20 12:16 AM   Specimen: Nasopharyngeal Swab; Nasopharyngeal(NP) swabs in vial transport medium  Result Value Ref Range Status   SARS Coronavirus 2 by RT PCR NEGATIVE NEGATIVE Final    Comment: (NOTE) SARS-CoV-2 target nucleic acids are NOT DETECTED.  The SARS-CoV-2 RNA is generally detectable in upper respiratory specimens during the acute phase of infection. The lowest concentration of SARS-CoV-2 viral copies this assay can detect is 138 copies/mL. A negative result does not preclude SARS-Cov-2 infection and should not be used as the sole basis for treatment or other patient management decisions. A  negative result may occur with  improper specimen collection/handling, submission of specimen other than nasopharyngeal swab, presence of viral mutation(s) within the areas targeted by this assay, and inadequate number of viral copies(<138 copies/mL). A negative result must be combined with clinical observations, patient history, and epidemiological information. The expected result is Negative.  Fact Sheet for Patients:  BloggerCourse.com  Fact Sheet for Healthcare Providers:  SeriousBroker.it  This test is no t yet approved or cleared by the [place] FDA and  has been authorized for detection and/or diagnosis of SARS-CoV-2 by FDA under an Emergency Use Authorization (EUA). This EUA will remain  in effect (meaning this test can be used) for the duration of the COVID-19 declaration under Section 564(b)(1) of the Act, 21 U.S.C.section 360bbb-3(b)(1), unless the authorization is terminated  or revoked sooner.       Influenza A by PCR NEGATIVE NEGATIVE Final   Influenza B by PCR NEGATIVE NEGATIVE Final    Comment: (NOTE) The Xpert Xpress SARS-CoV-2/FLU/RSV plus assay is intended as an aid in the diagnosis of influenza from Nasopharyngeal swab specimens and should not be used as a sole basis for treatment. Nasal washings and aspirates are unacceptable for Xpert Xpress SARS-CoV-2/FLU/RSV testing.  Fact Sheet for Patients: BloggerCourse.com  Fact Sheet for Healthcare Providers: SeriousBroker.it  This test is not yet approved or cleared by the [place] FDA and has been authorized for detection and/or diagnosis of SARS-CoV-2 by FDA under an Emergency Use Authorization (EUA). This EUA will remain in effect (meaning this test can be used) for the duration of the COVID-19 declaration under Section 564(b)(1) of the Act, 21 U.S.C. section 360bbb-3(b)(1), unless the authorization  is terminated or revoked.  Performed at [facility], [address], [place], [place] [address]          Radiology Studies: No results found.      Scheduled Meds:  amitriptyline  25 mg Oral QHS   aspirin EC  81 mg Oral Daily   atorvastatin  20 mg Oral QHS   Cholecalciferol  1,000 Units Oral Daily   ferrous sulfate  325 mg Oral QODAY   FLUoxetine  80 mg Oral Daily   nicotine  21 mg Transdermal Daily   nystatin  1 application Topical BID   pantoprazole  40 mg Oral Daily   prasugrel  10 mg Oral Daily  senna-docusate  2 tablet Oral Daily   sodium chloride flush  3 mL Intravenous Q12H   vitamin B-12  1,000 mcg Oral Daily   Continuous Infusions:  sodium chloride       LOS: 0 days    Time spent: 33 mins    Charise Killian, MD [facility] Pager [phone]  If 7PM-7AM, please contact night-coverage 10/20/2020, 8:28 AM

## 2020-10-20 NOTE — H&P (Signed)
History and Physical    Jody Lowe PNT:[id number] DOB: [birth year] DOA: 10/19/2020  PCP: Idelia Salm, MD   Patient coming from: Home  I have personally briefly reviewed patient's old medical records in [facility]  Chief Complaint: Weakness  HPI: Jody Lowe is a 75 y.o. female with medical history significant for peripheral vascular disease status post recent angiogram of her left lower extremity with stent angioplasty, history of anemia of chronic disease, hypertension and nicotine dependence who was brought to the emergency room EMS for evaluation of pain in both lower extremities.  Patient rates her pain by 10 in intensity at its worst and denies any trauma recent falls.  She was recently discharged from the hospital 2 days prior to this admission and states that she fell in the lobby while waiting for her ride and has had difficulty ambulating at home.  She is unsteady on her feet and is concerned about sustaining a fall.  She lives alone and is unable to carry out her activities of daily living. She denies having any chest pain, no shortness of breath, no lower extremity swelling, no nausea, no vomiting, no abdominal pain, no dizziness, no lightheadedness, no headache, no urinary frequency, no dysuria, no nocturia, no changes in her bowel habits, no headache, no focal deficits, no palpitations. Labs show sodium 135, potassium 3.5, chloride 103, bicarb 25, glucose 100, BUN 10, creatinine 1.04,calcium 8.7, white count 12.8, hemoglobin 8.7, hematocrit 26, MCV 82, RDW 16.7, platelet count 424 Respiratory viral panel is pending 12-lead EKG reviewed by me shows normal sinus rhythm   ED Course: Patient is a 75 year old female with a past medical history significant for obesity, peripheral vascular disease status post recent stent angioplasty to the left lower extremity, hypertension and anemia of chronic disease who presents to the ER via EMS for evaluation of weakness and  difficulty with ambulation. She will be referred to observation status for further evaluation.   Review of Systems: As per HPI otherwise all other systems reviewed and negative.    Past Medical History:  Diagnosis Date   Anemia    Hypertension    Peripheral artery disease (HCC)    Renal disorder     Past Surgical History:  Procedure Laterality Date   BREAST REDUCTION SURGERY     CATARACT EXTRACTION W/PHACO Left 11/11/2019   Procedure: CATARACT EXTRACTION PHACO AND INTRAOCULAR LENS PLACEMENT (IOC) LEFT VISION BLUE;  Surgeon: Elliot Cousin, MD;  Location: [facility];  Service: Ophthalmology;  Laterality: Left;  7.94 0:42.4   CATARACT EXTRACTION W/PHACO Right 12/23/2019   Procedure: CATARACT EXTRACTION PHACO AND INTRAOCULAR LENS PLACEMENT (IOC) RIGHT VISION BLUE 5.47  00:31.1;  Surgeon: Elliot Cousin, MD;  Location: [facility];  Service: Ophthalmology;  Laterality: Right;   LOWER EXTREMITY ANGIOGRAPHY N/A 10/16/2020   Procedure: Lower Extremity Angiography;  Surgeon: Annice Needy, MD;  Location: [facility] INVASIVE CV LAB;  Service: Cardiovascular;  Laterality: N/A;     reports that she has been smoking cigarettes. She has been smoking an average of .5 packs per day. She has never used smokeless tobacco. She reports that she does not drink alcohol and does not use drugs.  Allergies  Allergen Reactions   Gabapentin Other (See Comments)    Nervous, shaky   Plavix [Clopidogrel Bisulfate] Rash    Family History  Problem Relation Age of Onset   Hypertension Sister       Prior to Admission medications   Medication Sig Start  Date End Date Taking? Authorizing Provider  albuterol (VENTOLIN HFA) 108 (90 Base) MCG/ACT inhaler Inhale 1-2 puffs into the lungs every 6 (six) hours as needed for wheezing or shortness of breath.   Yes [provider]  alendronate (FOSAMAX) 70 MG tablet Take 70 mg by mouth once a week. 04/20/19  Yes [provider]  amitriptyline  (ELAVIL) 25 MG tablet Take 25 mg by mouth at bedtime.   Yes [provider]  aspirin EC 81 MG tablet Take 81 mg by mouth daily.   Yes [provider]  atorvastatin (LIPITOR) 20 MG tablet Take 1 tablet (20 mg total) by mouth at bedtime. 10/17/20  Yes Wieting, Richard, MD  Cholecalciferol (D3-1000) 25 MCG (1000 UT) capsule Take 1,000 Units by mouth daily.   Yes [provider]  diclofenac Sodium (VOLTAREN) 1 % GEL Apply 2 g topically 4 (four) times daily.   Yes [provider]  esomeprazole (NEXIUM) 40 MG capsule Take 40 mg by mouth daily.   Yes [provider]  ferrous sulfate 325 (65 FE) MG tablet Take 325 mg by mouth every other day.   Yes [provider]  FLUoxetine (PROZAC) 40 MG capsule Take 2 capsules (80 mg total) by mouth daily. Take one capsule (40 mg) by mouth once daily for one month, then increase to two capsules (80 mg) thereafter 10/17/20  Yes Wieting, Richard, MD  fluticasone (FLONASE) 50 MCG/ACT nasal spray Place 1 spray into both nostrils daily.   Yes [provider]  ipratropium (ATROVENT) 0.03 % nasal spray Place 2 sprays into both nostrils 3 (three) times daily.   Yes [provider]  meclizine (ANTIVERT) 25 MG tablet Take 1 tablet (25 mg total) by mouth 3 (three) times daily as needed for dizziness or nausea. 09/20/19  Yes Emily Filbert, MD  nicotine (NICODERM CQ - DOSED IN MG/24 HOURS) 21 mg/24hr patch One 21 mg patch chest wall daily (okay to substitute generic) 10/17/20  Yes Wieting, Richard, MD  nystatin (MYCOSTATIN/NYSTOP) powder Apply 1 application topically 2 (two) times daily.   Yes [provider]  Oxycodone HCl 10 MG TABS Take 10 mg by mouth every 6 (six) hours as needed (pain).   Yes [provider]  polyethylene glycol (MIRALAX / GLYCOLAX) 17 g packet Take 17 g by mouth daily as needed for mild constipation.   Yes [provider]  prasugrel (EFFIENT) 10 MG TABS tablet  Take 1 tablet (10 mg total) by mouth daily. 10/18/20  Yes Wieting, Richard, MD  senna-docusate (SENOKOT-S) 8.6-50 MG tablet Take 2 tablets by mouth daily.   Yes [provider]  vitamin B-12 (CYANOCOBALAMIN) 1000 MCG tablet Take 1 tablet (1,000 mcg total) by mouth daily. 10/17/20  Yes Alford Highland, MD    Physical Exam: Vitals:   10/19/20 1757 10/19/20 2309  BP: (!) 154/70 (!) 147/59  Pulse: 90 83  Resp: 16 20  Temp: 98.7 F (37.1 C)   TempSrc: Oral   SpO2: 99% 97%  Weight: 98.9 kg   Height: 5\' 6"  (1.676 m)      Vitals:   10/19/20 1757 10/19/20 2309  BP: (!) 154/70 (!) 147/59  Pulse: 90 83  Resp: 16 20  Temp: 98.7 F (37.1 C)   TempSrc: Oral   SpO2: 99% 97%  Weight: 98.9 kg   Height: 5\' 6"  (1.676 m)       Constitutional: Alert and oriented x 3 . Not in any apparent distress HEENT:  Head: Normocephalic and atraumatic.         Eyes: PERLA, EOMI, Conjunctivae pallor. Sclera is non-icteric.       Mouth/Throat: Mucous membranes are moist.       Neck: Supple with no signs of meningismus. Cardiovascular: Regular rate and rhythm. No murmurs, gallops, or rubs. 2+ symmetrical distal pulses are present . No JVD. No LE edema Respiratory: Respiratory effort normal .Lungs sounds clear bilaterally. No wheezes, crackles, or rhonchi.  Gastrointestinal: Soft, non tender, and non distended with positive bowel sounds.  Genitourinary: No CVA tenderness. Musculoskeletal: Nontender with normal range of motion in all extremities. No cyanosis, or erythema of extremities. Neurologic:  Face is symmetric. Moving all extremities. No gross focal neurologic deficits .  Generalized weakness. Skin: Skin is warm, dry.  No rash or ulcers Psychiatric: Mood and affect are normal    Labs on Admission: I have personally reviewed following labs and imaging studies  CBC: Recent Labs  Lab 10/14/20 1745 10/15/20 0250 10/16/20 0601 10/17/20 0453 10/19/20 2313  WBC 6.2 10.9* 8.6 9.7  12.8*  NEUTROABS 3.4  --   --  7.3 9.4*  HGB 9.8* 9.1* 7.9* 9.6* 8.7*  HCT 29.4* 27.8* 24.2* 30.5* 26.0*  MCV 81.4 82.7 83.4 85.9 82.8  PLT 488* 407* 362 381 424*   Basic Metabolic Panel: Recent Labs  Lab 10/14/20 1745 10/15/20 0250 10/16/20 0601 10/17/20 0453 10/19/20 2313  NA 134* 135 133* 135 135  K 3.8 3.6 3.5 3.6 3.5  CL 106 107 107 104 103  CO2 22 23 21* 22 25  GLUCOSE 108* 100* 87 76 100*  BUN 16 15 11 10 10   CREATININE 1.45* 1.24* 1.18* 1.06* 1.04*  CALCIUM 9.0 8.5* 8.3* 8.8* 8.7*   GFR: Estimated Creatinine Clearance: 54.6 mL/min (A) (by C-G formula based on SCr of 1.04 mg/dL (H)). Liver Function Tests: Recent Labs  Lab 10/15/20 0250  AST 24  ALT 11  ALKPHOS 86  BILITOT 0.7  PROT 6.3*  ALBUMIN 2.9*   No results for input(s): LIPASE, AMYLASE in the last 168 hours. No results for input(s): AMMONIA in the last 168 hours. Coagulation Profile: Recent Labs  Lab 10/14/20 2254  INR 1.2   Cardiac Enzymes: Recent Labs  Lab 10/14/20 1745  CKTOTAL 65   BNP (last 3 results) No results for input(s): PROBNP in the last 8760 hours. HbA1C: No results for input(s): HGBA1C in the last 72 hours. CBG: No results for input(s): GLUCAP in the last 168 hours. Lipid Profile: No results for input(s): CHOL, HDL, LDLCALC, TRIG, CHOLHDL, LDLDIRECT in the last 72 hours. Thyroid Function Tests: No results for input(s): TSH, T4TOTAL, FREET4, T3FREE, THYROIDAB in the last 72 hours. Anemia Panel: No results for input(s): VITAMINB12, FOLATE, FERRITIN, TIBC, IRON, RETICCTPCT in the last 72 hours. Urine analysis:    Component Value Date/Time   COLORURINE YELLOW (A) 10/14/2020 1745   APPEARANCEUR CLEAR (A) 10/14/2020 1745   LABSPEC 1.009 10/14/2020 1745   PHURINE 6.0 10/14/2020 1745   GLUCOSEU NEGATIVE 10/14/2020 1745   HGBUR NEGATIVE 10/14/2020 1745   BILIRUBINUR NEGATIVE 10/14/2020 1745   KETONESUR NEGATIVE 10/14/2020 1745   PROTEINUR NEGATIVE 10/14/2020 1745   NITRITE  NEGATIVE 10/14/2020 1745   LEUKOCYTESUR NEGATIVE 10/14/2020 1745    Radiological Exams on Admission: No results found.   Assessment/Plan Principal Problem:   Generalized weakness Active Problems:   Tobacco use disorder   Peripheral vascular disease (HCC)   Hypertension, benign   Depressive disorder   Anemia  Generalized weakness Most likely secondary to physical deconditioning following recent hospitalization Patient states that she is unsteady on her feet is a high risk for falls Will place patient on fall precautions Will request PT evaluation Patient may benefit from discharge to skilled nursing facility for short-term rehab.     Peripheral vascular disease Status post recent stent angioplasty to the left lower extremity Continue Effient, aspirin and statins Continue as needed oxycodone for pain control   Depression Continue Prozac    Obesity (BMI 35) Complicates overall prognosis and care    Nicotine dependence Smoking cessation has been discussed with patient in detail Continue nicotine transdermal patch 21 mg daily    Anemia of chronic disease Continue Iron supplements   DVT prophylaxis: SCD  Code Status: full code  Family Communication: Greater than 50% of time was spent discussing patient's condition and plan of care with her at the bedside.  All questions and concerns have been addressed.  She verbalizes understanding and agrees to the plan.  CODE STATUS was discussed that she is a full code. Disposition Plan: Back to previous home environment Consults called: Physical therapy Status: Observation    Kymari Nuon MD [facility]     10/20/2020, 12:34 AM

## 2020-10-20 NOTE — ED Notes (Signed)
Informed RN bed assigned 

## 2020-10-20 NOTE — Evaluation (Addendum)
Physical Therapy Evaluation Patient Details Name: Jody Lowe MRN: [id number] DOB: 06/14/1945 Today's Date: 10/20/2020   History of Present Illness  presented to ER from home secondary to progressive weakness and inability to care for self in home environment; admitted for management/work up of weakness (possible deconditioning due to recent hospitalization per chart) and bilat LE pain.  Clinical Impression  Patient resting in bed upon arrival to session.  Alert and oriented; follows commands and agreeable to participation with treatment session.  Endorses generalized pain/soreness to bilat LEs (knees and distal LEs/ankles), FACES 4/10.  Generalized edema to L knee noted; imaging taken on previous admission negative for acute bony injury.  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  Able to complete bed mobility with mod indep; sit/stand, basic transfers and gait (60') with RW, cga.  Transfers completed from standard bed surface; increased time for problem-solving and sequencing.  Difficulty with LE placement under BOS (due to chronic, arthritic changes); uses 'rocking' motion to initiate movementum/anterior weight translation.  Heavy use of bilat UEs to lift buttocks from service; walks LEs under BOS, then completes remainder of standing. Gait pattern demonstrates partially reciprocal stepping pattern with decreased step height/length, decreased clearance (L > R); forward flexed posture; slow, guarded cadence but no overt buckling or LOB All transfers and gait require increased time/effort, but patient able to complete with minimal physical assist from therapist when allowed to self-initiate and perform in self-selected sequence. Of note, orthostatics assessed and negative with position change; no subjective reports of orthostasis.  Pain persistent with WBing and mobility, but does not significantly  increase during session.  RN present end of session for additional pain meds per patient request. Would benefit from skilled PT to address above deficits and promote optimal return to PLOF.; recommend transition to STR upon discharge from acute hospitalization for rehab course prior to return home alone (as patient recently unable to manage care needs in home with [facility] services and was re-admitted to hospital0..  If patient/family unable to manage current level of assist post-rehab, may consider other long-term care options as appropriate.     Follow Up Recommendations SNF (per patient/family request due to inability to manage care in home and subsequent readmission to hospital)    Equipment Recommendations       Recommendations for Other Services       Precautions / Restrictions Precautions Precautions: Fall Restrictions Weight Bearing Restrictions: No      Mobility  Bed Mobility Overal bed mobility: Modified Independent             General bed mobility comments: transitions from supine to long-sitting; incrementally mobilizes LEs towards edge of bed (towards R), self-assisting LEs as needed    Transfers Overall transfer level: Needs assistance Equipment used: 4-wheeled walker;Rolling walker (2 wheeled) Transfers: Sit to/from Stand Sit to Stand: Min guard         General transfer comment: completes from standard bed surface; increased time for problem-solving and sequencing.  Difficulty with LE placement under BOS (due to chronic, arthritic changes); uses 'rocking' motion to initiate movementum/anterior weight translation.  Heavy use of bilat UEs to lift buttocks from service; walks LEs under BOS, then completes remainder of standing.  Ambulation/Gait Ambulation/Gait assistance: Min guard Gait Distance (Feet): 60 Feet Assistive device: 4-wheeled walker       General Gait Details: partially reciprocal stepping pattern with decreased step height/length, decreased  clearance (L > R);  forward flexed posture; slow, guarded cadence but no overt buckling or LOB  Stairs            Wheelchair Mobility    Modified Rankin (Stroke Patients Only)       Balance Overall balance assessment: Needs assistance Sitting-balance support: No upper extremity supported;Feet supported Sitting balance-Leahy Scale: Good     Standing balance support: Bilateral upper extremity supported Standing balance-Leahy Scale: Fair Standing balance comment: Requires BUE support                             Pertinent Vitals/Pain Pain Assessment: Faces Faces Pain Scale: Hurts little more Pain Location: bilat knees, distal LEs (L > R) Pain Descriptors / Indicators: Sore Pain Intervention(s): Limited activity within patient's tolerance;Monitored during session;Repositioned    Home Living Family/patient expects to be discharged to:: Private residence Living Arrangements: Alone Available Help at Discharge: Family;Friend(s);Available PRN/intermittently Type of Home: Apartment Home Access: Level entry     Home Layout: One level Home Equipment: Shower seat;Bedside commode      Prior Function Level of Independence: Independent with assistive device(s)         Comments: Mod indep with 3WRW for ADLs, household mobilization; endorses very limited community mobilization, often using facility [facility] when going to/from appointments.  Completes most bathing routines via sponge bathing.  Received Meals on Wheels and son provides assist with groceries, community needs.  Does endorse falls in past, difficulty quantifying.     Hand Dominance   Dominant Hand: Right    Extremity/Trunk Assessment   Upper Extremity Assessment Upper Extremity Assessment: Overall WFL for tasks assessed    Lower Extremity Assessment Lower Extremity Assessment: Generalized weakness (grossly 4-/5 throughout; lacking last 10-15 degrees of L knee extension (limited by pain))        Communication   Communication: No difficulties  Cognition Arousal/Alertness: Awake/alert Behavior During Therapy: WFL for tasks assessed/performed Overall Cognitive Status: Within Functional Limits for tasks assessed                                        General Comments      Exercises Other Exercises Other Exercises: Sit/stand from various seating surfaces, cga-improved sequencing and overall performance with repetition. Able to complete with comparable level of assist despite surface change. Other Exercises: Orthostatic assessment as noted in vitals flowsheet; stable and WFL. No subjective symptoms reported. Other Exercises: Educated in LE seated therex to be performed while up in chair, active ROM (ankle pumps, LAQs, marching); encouraged elevation as tolerated for edema management.  Patient voiced understanding; did prefer to maintain bilat LEs in dep position.   Assessment/Plan    PT Assessment Patient needs continued PT services  PT Problem List Decreased strength;Decreased range of motion;Decreased activity tolerance;Decreased balance;Decreased mobility;Decreased coordination;Decreased knowledge of use of DME;Decreased safety awareness;Decreased knowledge of precautions       PT Treatment Interventions Gait training;Stair training;Functional mobility training;Therapeutic activities;Therapeutic exercise;Neuromuscular re-education;Balance training    PT Goals (Current goals can be found in the Care Plan section)  Acute Rehab PT Goals Patient Stated Goal: to go home PT Goal Formulation: With patient Time For Goal Achievement: 11/03/20 Potential to Achieve Goals: Good    Frequency Min 2X/week   Barriers to discharge        Co-evaluation  AM-PAC PT "6 Clicks" Mobility  Outcome Measure Help needed turning from your back to your side while in a flat bed without using bedrails?: None Help needed moving from lying on your back to sitting  on the side of a flat bed without using bedrails?: None Help needed moving to and from a bed to a chair (including a wheelchair)?: A Little Help needed standing up from a chair using your arms (e.g., wheelchair or bedside chair)?: A Little Help needed to walk in hospital room?: A Little Help needed climbing 3-5 steps with a railing? : A Little 6 Click Score: 8    End of Session Equipment Utilized During Treatment: Gait belt Activity Tolerance: Patient tolerated treatment well Patient left: in chair;with call bell/phone within reach;with chair alarm set Nurse Communication: Mobility status PT Visit Diagnosis: Other abnormalities of gait and mobility (R26.89);Muscle weakness (generalized) (M62.81);History of falling (Z91.81)    Time: 1004-1100 PT Time Calculation (min) (ACUTE ONLY): 56 min   Charges:   PT Evaluation $PT Eval Moderate Complexity: 1 Mod PT Treatments $Gait Training: 8-22 mins $Therapeutic Exercise: 8-22 mins $Therapeutic Activity: 8-22 mins       Latorya Bautch H. Manson Passey, PT, DPT, NCS 10/20/20, 4:29 PM [phone]

## 2020-10-20 NOTE — ED Notes (Signed)
Admitting a bedside at this time.

## 2020-10-20 NOTE — ED Notes (Signed)
Pt complained of knee pain.  Pillows applied under both.  Tylenol given. Repositioned in bed.

## 2020-10-21 DIAGNOSIS — I739 Peripheral vascular disease, unspecified: Secondary | ICD-10-CM | POA: Diagnosis not present

## 2020-10-21 DIAGNOSIS — R531 Weakness: Secondary | ICD-10-CM | POA: Diagnosis not present

## 2020-10-21 DIAGNOSIS — D638 Anemia in other chronic diseases classified elsewhere: Secondary | ICD-10-CM | POA: Diagnosis not present

## 2020-10-21 LAB — BASIC METABOLIC PANEL
Anion gap: 4 — ABNORMAL LOW (ref 5–15)
BUN: 14 mg/dL (ref 8–23)
CO2: 25 mmol/L (ref 22–32)
Calcium: 8.4 mg/dL — ABNORMAL LOW (ref 8.9–10.3)
Chloride: 104 mmol/L (ref 98–111)
Creatinine, Ser: 0.95 mg/dL (ref 0.44–1.00)
GFR, Estimated: >60 mL/min (ref >60)
Glucose, Bld: 88 mg/dL (ref 70–99)
Potassium: 3.6 mmol/L (ref 3.5–5.1)
Sodium: 133 mmol/L — ABNORMAL LOW (ref 135–145)

## 2020-10-21 LAB — CBC
HCT: 23.3 % — ABNORMAL LOW (ref 36.0–46.0)
Hemoglobin: 7.5 g/dL — ABNORMAL LOW (ref 12.0–15.0)
MCH: 26.1 pg (ref 26.0–34.0)
MCHC: 32.2 g/dL (ref 30.0–36.0)
MCV: 81.2 fL (ref 80.0–100.0)
Platelets: 379 10*3/uL (ref 150–400)
RBC: 2.87 MIL/uL — ABNORMAL LOW (ref 3.87–5.11)
RDW: 17.1 % — ABNORMAL HIGH (ref 11.5–15.5)
WBC: 8.9 10*3/uL (ref 4.0–10.5)
nRBC: 0 % (ref 0.0–0.2)

## 2020-10-21 MED ORDER — OXYCODONE HCL 5 MG PO TABS: 5.0000 mg | ORAL_TABLET | Freq: Once | ORAL | Status: DC

## 2020-10-21 NOTE — NC FL2 (Signed)
[place] MEDICAID [id number] LEVEL OF CARE SCREENING TOOL     IDENTIFICATION  Patient Name: Jody Lowe Birthdate: 1945/11/12 Sex: female Admission Date (Current Location): 10/19/2020  [facility] and [place] Number:  Chiropodist and Address:  [facility], [address] [place], [place] [address]      Provider Number: [id number]  Attending Physician Name and Address:  Charise Killian, MD  Relative Name and Phone Number:  Jeanie Cooks [phone]    Current Level of Care: Hospital Recommended Level of Care: Skilled Nursing Facility Prior Approval Number:    Date Approved/Denied: 10/21/20 PASRR Number: [id number]  Discharge Plan: SNF    Current Diagnoses: Patient Active Problem List   Diagnosis Date Noted   Generalized weakness 10/20/2020   Weakness    Syncope 10/16/2020   Drop in hemoglobin    B12 deficiency    Elevated troponin    Pain in both lower extremities    Chronic pain of both knees    Syncope and collapse 10/14/2020   Anemia 10/14/2020   AKI (acute kidney injury) (HCC) 10/14/2020   TIA (transient ischemic attack) 05/01/2019   Age-related osteoporosis without current pathological fracture 06/27/2016   Tobacco use disorder 05/09/2015   Iron deficiency anemia due to chronic blood loss 03/15/2009   Peripheral vascular disease (HCC) 07/10/2005   Hypertension, benign 03/29/2003   Depressive disorder 10/06/2001   B-complex deficiency 05/01/1995   Esophagitis 08/18/1990    Orientation RESPIRATION BLADDER Height & Weight     Self, Time, Situation, Place  Normal Continent Weight: 98.9 kg Height:  5\' 6"  (167.6 cm)  BEHAVIORAL SYMPTOMS/MOOD NEUROLOGICAL BOWEL NUTRITION STATUS      Continent Diet  AMBULATORY STATUS COMMUNICATION OF NEEDS Skin   Limited Assist Verbally Normal                       Personal Care Assistance Level of Assistance  Bathing, Dressing Bathing Assistance: Limited assistance   Dressing  Assistance: Limited assistance     Functional Limitations Info             SPECIAL CARE FACTORS FREQUENCY                       Contractures      Additional Factors Info                  Current Medications (10/21/2020):  This is the current hospital active medication list Current Facility-Administered Medications  Medication Dose Route Frequency Provider Last Rate Last Admin   0.9 %  sodium chloride infusion  250 mL Intravenous PRN Agbata, Tochukwu, MD       acetaminophen (TYLENOL) tablet 650 mg  650 mg Oral Q6H PRN Agbata, Tochukwu, MD   650 mg at 10/20/20 0310   Or   acetaminophen (TYLENOL) suppository 650 mg  650 mg Rectal Q6H PRN Agbata, Tochukwu, MD       albuterol (PROVENTIL) (2.5 MG/3ML) 0.083% nebulizer solution 2.5 mg  2.5 mg Nebulization Q6H PRN Belue, 10/22/20, RPH       amitriptyline (ELAVIL) tablet 25 mg  25 mg Oral QHS Agbata, Tochukwu, MD   25 mg at 10/20/20 2100   aspirin EC tablet 81 mg  81 mg Oral Daily Agbata, Tochukwu, MD   81 mg at 10/21/20 0915   atorvastatin (LIPITOR) tablet 20 mg  20 mg Oral QHS Agbata, Tochukwu, MD   20 mg at 10/20/20 2100  cholecalciferol (VITAMIN D3) tablet 1,000 Units  1,000 Units Oral Daily Agbata, Tochukwu, MD   1,000 Units at 10/21/20 0915   ferrous sulfate tablet 325 mg  325 mg Oral QODAY Agbata, Tochukwu, MD   325 mg at 10/20/20 1104   FLUoxetine (PROZAC) capsule 80 mg  80 mg Oral Daily Agbata, Tochukwu, MD   80 mg at 10/21/20 0914   fluticasone (FLONASE) 50 MCG/ACT nasal spray 1 spray  1 spray Each Nare Daily PRN Agbata, Tochukwu, MD       ipratropium (ATROVENT) 0.03 % nasal spray 2 spray  2 spray Each Nare TID PRN Agbata, Tochukwu, MD       meclizine (ANTIVERT) tablet 25 mg  25 mg Oral TID PRN Agbata, Tochukwu, MD       nicotine (NICODERM CQ - dosed in mg/24 hours) patch 21 mg  21 mg Transdermal Daily Agbata, Tochukwu, MD       nystatin (MYCOSTATIN/NYSTOP) topical powder 1 application  1 application Topical BID  Agbata, Tochukwu, MD   1 application at 10/21/20 0915   ondansetron (ZOFRAN) tablet 4 mg  4 mg Oral Q6H PRN Agbata, Tochukwu, MD       Or   ondansetron (ZOFRAN) injection 4 mg  4 mg Intravenous Q6H PRN Agbata, Tochukwu, MD       oxyCODONE (Oxy IR/ROXICODONE) immediate release tablet 10 mg  10 mg Oral Q6H PRN Agbata, Tochukwu, MD   10 mg at 10/21/20 0032   pantoprazole (PROTONIX) EC tablet 40 mg  40 mg Oral Daily Agbata, Tochukwu, MD   40 mg at 10/21/20 0915   polyethylene glycol (MIRALAX / GLYCOLAX) packet 17 g  17 g Oral Daily PRN Agbata, Tochukwu, MD       prasugrel (EFFIENT) tablet 10 mg  10 mg Oral Daily Agbata, Tochukwu, MD   10 mg at 10/21/20 0915   senna-docusate (Senokot-S) tablet 2 tablet  2 tablet Oral Daily Agbata, Tochukwu, MD   2 tablet at 10/21/20 3419   sodium chloride flush (NS) 0.9 % injection 3 mL  3 mL Intravenous Q12H Agbata, Tochukwu, MD   3 mL at 10/21/20 0915   sodium chloride flush (NS) 0.9 % injection 3 mL  3 mL Intravenous PRN Agbata, Tochukwu, MD       vitamin B-12 (CYANOCOBALAMIN) tablet 1,000 mcg  1,000 mcg Oral Daily Agbata, Tochukwu, MD   1,000 mcg at 10/21/20 0915     Discharge Medications: Please see discharge summary for a list of discharge medications.  Relevant Imaging Results:  Relevant Lab Results:   Additional Information    Ashley Royalty, Lutricia Feil, RN

## 2020-10-21 NOTE — Progress Notes (Signed)
Mobility Specialist - Progress Note   10/21/20 1300  Mobility  Activity Ambulated in hall  Level of Assistance Standby assist, set-up cues, supervision of patient - no hands on  Assistive Device Other (Comment) (Rollator)  Distance Ambulated (ft) 200 ft  Mobility Ambulated with assistance in hallway  Mobility Response Tolerated well  Mobility performed by Mobility specialist  $Mobility charge 1 Mobility    Pt lying in bed upon arrival, utilizing RA. Pt sat EOB independently. Lightheaded with position change that did resolve prior to ambulation. Supervision and extra time to come into standing. Pt ambulated full lap around nursing station with min-guard, no LOB. 2 seated rest breaks taken. Initially denied pain, but does develop some BLE cramping and tightness. Pt to use bathroom prior to return to chair. Independent for hygiene care. MinA and rails to stand from standard toilet. Pt reports LE feeling better after session. Left in recliner with alarm set and needs in reach.   Filiberto Pinks Mobility Specialist 10/21/20, 1:46 PM

## 2020-10-21 NOTE — Evaluation (Signed)
Occupational Therapy Evaluation Patient Details Name: Jody Lowe MRN: [id number] DOB: 1945/11/24 Today's Date: 10/21/2020    History of Present Illness presented to ER from home secondary to progressive weakness and inability to care for self in home environment; admitted for management/work up of weakness (possible deconditioning due to recent hospitalization per chart) and bilat LE pain.   Clinical Impression   Pt seen for OT evaluation this date in setting of acute hospitalization d/t weakness. Pt reports living at home alone and having recent fall and admission and now with decreased tolerance for fxl distances in her home. States she typically uses 3WW in the home for amb at baseline. Pt presents this date with some general deconditioning superimposed on some baseline weakness. On OT assessment, pt requires: SETUP to MIN A for seated UB ADLs, MIN A to MOD A for seated LB ADLs, endorses socks/shoes are a burden at baseline and pt has opted to just wear slip ons. Pt requires CGA/MIN A for ADL transfers wtih RW. Has to get rocking start. Cues for safe sequence with use of rollator. Pt left in chair at end of session with all needs met and in reach. Anticipate she will require f/u OT services in STR setting to improve to functional level appropriate for living alone and performing all ADLs I'ly/MOD I.     Follow Up Recommendations  SNF    Equipment Recommendations  3 in 1 bedside commode;Tub/shower seat;Other (comment) (rollator)    Recommendations for Other Services       Precautions / Restrictions Precautions Precautions: Fall Restrictions Weight Bearing Restrictions: No      Mobility Bed Mobility               General bed mobility comments: up to chair pre/post    Transfers Overall transfer level: Needs assistance Equipment used: 4-wheeled walker Transfers: Sit to/from Stand Sit to Stand: Min guard;Min assist         General transfer comment: cues re: brakes  and hand placement with 4WW as well as cues to control descent to improve strength and reduce fall risk    Balance Overall balance assessment: Needs assistance Sitting-balance support: No upper extremity supported;Feet supported Sitting balance-Leahy Scale: Good Sitting balance - Comments: G static sitting, F dynamic   Standing balance support: Bilateral upper extremity supported Standing balance-Leahy Scale: Fair Standing balance comment: Requires BUE support                           ADL either performed or assessed with clinical judgement   ADL Overall ADL's : Needs assistance/impaired                                       General ADL Comments: pt requires SETUP to MIN A for seated UB ADLs, MIN A to MOD A for seated LB ADLs, endorses socks/shoes are a burden at baseline and pt has opted to just wear slip ons. Pt requires CGA/MIN A for ADL transfers wtih RW. Has to get rocking start.     Vision Patient Visual Report: No change from baseline       Perception     Praxis      Pertinent Vitals/Pain Pain Assessment: 0-10 Pain Score: 4  Pain Location: bilat knees, distal LEs (L > R) Pain Descriptors / Indicators: Sore Pain Intervention(s): Limited activity within patient's tolerance;Monitored  during session;Repositioned     Hand Dominance Right   Extremity/Trunk Assessment Upper Extremity Assessment Upper Extremity Assessment: RUE deficits/detail;LUE deficits/detail RUE Deficits / Details: R shld flexion limited by h/o arthritis, flexion to 1/2 range. Grip MMT grossly 4-/5 LUE Deficits / Details: ROM WFL, MMT Grossly 4-/5 throughout   Lower Extremity Assessment Lower Extremity Assessment: Generalized weakness (some increased limitation in L LE d/t some increasd pain. h/o stents in b/l LEs, more recently to L)   Cervical / Trunk Assessment Cervical / Trunk Assessment: Normal   Communication Communication Communication: No difficulties    Cognition Arousal/Alertness: Awake/alert Behavior During Therapy: WFL for tasks assessed/performed Overall Cognitive Status: Within Functional Limits for tasks assessed                                 General Comments: AOx4   General Comments       Exercises Other Exercises Other Exercises: OT ed re: safet use of rollator   Shoulder Instructions      Home Living Family/patient expects to be discharged to:: Private residence Living Arrangements: Alone Available Help at Discharge: Family;Friend(s);Available PRN/intermittently Type of Home: Apartment Home Access: Level entry     Home Layout: One level     Bathroom Shower/Tub: Teacher, early years/pre: Standard     Home Equipment: Shower seat;Bedside commode          Prior Functioning/Environment Level of Independence: Independent with assistive device(s)  Gait / Transfers Assistance Needed: Modified independent with household ambulation with 3-RW ADL's / Homemaking Assistance Needed: Pt does not drive, cook, or leave her apartment without assitance normally; pt did state during prior to syncopal episode and ED admittance she just returned from "grocery store"   Comments: Mod indep with 3WRW for ADLs, household mobilization; endorses very limited community mobilization, often using facility [facility] when going to/from appointments.  Completes most bathing routines via sponge bathing.  Received Meals on Wheels and son provides assist with groceries, community needs.  Does endorse falls in past, difficulty quantifying.        OT Problem List: Decreased strength;Decreased activity tolerance      OT Treatment/Interventions: Self-care/ADL training;Therapeutic activities;Therapeutic exercise    OT Goals(Current goals can be found in the care plan section) Acute Rehab OT Goals Patient Stated Goal: to go home OT Goal Formulation: With patient Time For Goal Achievement: 11/04/20 Potential to Achieve Goals:  Good ADL Goals Pt Will Perform Lower Body Dressing: with min guard assist;sit to/from stand;with adaptive equipment Pt Will Transfer to Toilet: with supervision;ambulating;grab bars Pt Will Perform Toileting - Clothing Manipulation and hygiene: with supervision;sit to/from stand Pt/caregiver will Perform Home Exercise Program: Increased strength;Both right and left upper extremity;With Supervision (mindful of arthritic R shoulder)  OT Frequency: Min 1X/week   Barriers to D/C:            Co-evaluation              AM-PAC OT "6 Clicks" Daily Activity     Outcome Measure Help from another person eating meals?: None Help from another person taking care of personal grooming?: A Little Help from another person toileting, which includes using toliet, bedpan, or urinal?: A Little Help from another person bathing (including washing, rinsing, drying)?: A Little Help from another person to put on and taking off regular upper body clothing?: A Little Help from another person to put on and taking off regular lower  body clothing?: A Little 6 Click Score: 19   End of Session Equipment Utilized During Treatment: Gait belt;Rolling walker  Activity Tolerance: Patient tolerated treatment well Patient left: in chair;with call bell/phone within reach;with chair alarm set  OT Visit Diagnosis: Unsteadiness on feet (R26.81);Muscle weakness (generalized) (M62.81)                Time: 4045-9136 OT Time Calculation (min): 39 min Charges:  OT General Charges $OT Visit: 1 Visit OT Evaluation $OT Eval Moderate Complexity: 1 Mod OT Treatments $Self Care/Home Management : 8-22 mins $Therapeutic Activity: 8-22 mins  Gerrianne Scale, MS, OTR/L ascom [phone] 10/21/20, 6:14 PM

## 2020-10-21 NOTE — TOC Progression Note (Signed)
Transition of Care [facility]) - Progression Note    Patient Details  Name: Jody Lowe MRN: [id number] Date of Birth: Dec 22, 1945  [facility] CM/SW Contact  Ashley Royalty Lutricia Feil, RN Phone Number: [phone] 10/21/2020, 10:01 AM  Clinical Narrative:    Spoke with pt's sister Jody Lowe [phone] concerning the recommendations via SNF for discharge plans for pt. Sister agrees and pt has requested placement due to inability to care for herself independently. Sister has agreed to start this process for placement indicating pt lives along and needs help. States pt has a son involved with her care Jody Lowe [phone]) however limited with available assistance but responsible for getting pt's medications and transporting pt to all her medical appointments. Will obtain PASRR# and connect with available facilities for SNF placement. Sister is open to any local SNF.  TOC will remain available and contact pt/sister/son Donna Bernard with bed offers.   Expected Discharge Plan: Skilled Nursing Facility Barriers to Discharge: Continued Medical Work up  Expected Discharge Plan and Services Expected Discharge Plan: Skilled Nursing Facility     Post Acute Care Choice: Skilled Nursing Facility Living arrangements for the past 2 months: Apartment                 DME Arranged: Government social research officer                     Social Determinants of Health (SDOH) Interventions    Readmission Risk Interventions No flowsheet data found.

## 2020-10-21 NOTE — Progress Notes (Signed)
PROGRESS NOTE    Jody Lowe  XNA:[id number] DOB: Sep 04, 1945 DOA: 10/19/2020 PCP: Idelia Salm, MD    Assessment & Plan:   Principal Problem:   Generalized weakness Active Problems:   Tobacco use disorder   Peripheral vascular disease (HCC)   Hypertension, benign   Depressive disorder   Anemia   Generalized weakness: likely secondary to physical deconditioning following recent hospitalization. High fall risk. Difficulty taking care of herself at home. PT recs SNF    PVD: s/p recent stent angioplasty to the left lower extremity. Continue on statin, aspirin & effient    Depression: severity unknown. Continue on home dose of fluoxetine    Obesity: BMI 35.1. Complicates overall care & prognosis    Nicotine dependence: nicotine patch to prevent w/drawal. Received smoking cessation counseling    ACD: H&H are trending down again. Pt denies any black or bloody stools, hematuria, hemoptysis or hematemesis. Pt is on effient and aspirin. Will transfuse if Hb <7.0   Hypokalemia: WNL today    DVT prophylaxis:  lovenox  Code Status: full  Family Communication:  Disposition Plan: PT recs SNF   Level of care: Med-Surg  Status is: Observation  The patient will require care spanning > 2 midnights and should be moved to inpatient because: Unsafe d/c plan  Dispo: The patient is from: Home              Anticipated d/c is to: SNF              Patient currently is not medically stable to d/c.   Difficult to place patient Yes    Consultants:    Procedures:   Antimicrobials:    Subjective: Pt c/o malaise   Objective: Vitals:   10/20/20 0813 10/20/20 0942 10/20/20 2148 10/21/20 0513  BP: 136/70 (!) 145/70 (!) 143/55 (!) 128/52  Pulse: 78 86 82 93  Resp: 18 18 20 16   Temp: 98.8 F (37.1 C) 98.3 F (36.8 C) 98.4 F (36.9 C) 99.5 F (37.5 C)  TempSrc: Oral Oral Oral Oral  SpO2: 96% 100% 100% 94%  Weight:      Height:        Intake/Output Summary (Last  24 hours) at 10/21/2020 0733 Last data filed at 10/20/2020 2100 Gross per 24 hour  Intake 240 ml  Output 800 ml  Net -560 ml   Filed Weights   10/19/20 1757  Weight: 98.9 kg    Examination:  General exam: Appears comfortable Respiratory system: clear breath sounds b/l  Cardiovascular system: S1/S2+. No rubs or clicks  Gastrointestinal system: Abd is soft, NT, obese & hypoactive bowel sounds  Central nervous system: Alert and oriented. Moves all extremities  Psychiatry: Judgement and insight appear normal. Flat mood and affect    Data Reviewed: I have personally reviewed following labs and imaging studies  CBC: Recent Labs  Lab 10/14/20 1745 10/15/20 0250 10/16/20 0601 10/17/20 0453 10/19/20 2313 10/20/20 0654 10/21/20 0425  WBC 6.2   < > 8.6 9.7 12.8* 11.7* 8.9  NEUTROABS 3.4  --   --  7.3 9.4*  --   --   HGB 9.8*   < > 7.9* 9.6* 8.7* 7.8* 7.5*  HCT 29.4*   < > 24.2* 30.5* 26.0* 23.4* 23.3*  MCV 81.4   < > 83.4 85.9 82.8 81.8 81.2  PLT 488*   < > 362 381 424* 380 379   < > = values in this interval not displayed.   Basic Metabolic  Panel: Recent Labs  Lab 10/16/20 0601 10/17/20 0453 10/19/20 2313 10/20/20 0654 10/21/20 0425  NA 133* 135 135 134* 133*  K 3.5 3.6 3.5 3.4* 3.6  CL 107 104 103 102 104  CO2 21* 22 25 25 25   GLUCOSE 87 76 100* 85 88  BUN 11 10 10 10 14   CREATININE 1.18* 1.06* 1.04* 0.86 0.95  CALCIUM 8.3* 8.8* 8.7* 8.5* 8.4*   GFR: Estimated Creatinine Clearance: 59.7 mL/min (by C-G formula based on SCr of 0.95 mg/dL). Liver Function Tests: Recent Labs  Lab 10/15/20 0250  AST 24  ALT 11  ALKPHOS 86  BILITOT 0.7  PROT 6.3*  ALBUMIN 2.9*   No results for input(s): LIPASE, AMYLASE in the last 168 hours. No results for input(s): AMMONIA in the last 168 hours. Coagulation Profile: Recent Labs  Lab 10/14/20 2254  INR 1.2   Cardiac Enzymes: Recent Labs  Lab 10/14/20 1745  CKTOTAL 65   BNP (last 3 results) No results for  input(s): PROBNP in the last 8760 hours. HbA1C: No results for input(s): HGBA1C in the last 72 hours. CBG: No results for input(s): GLUCAP in the last 168 hours. Lipid Profile: No results for input(s): CHOL, HDL, LDLCALC, TRIG, CHOLHDL, LDLDIRECT in the last 72 hours. Thyroid Function Tests: No results for input(s): TSH, T4TOTAL, FREET4, T3FREE, THYROIDAB in the last 72 hours. Anemia Panel: No results for input(s): VITAMINB12, FOLATE, FERRITIN, TIBC, IRON, RETICCTPCT in the last 72 hours. Sepsis Labs: Recent Labs  Lab 10/14/20 1745 10/14/20 1959  LATICACIDVEN 2.3* 1.1    Recent Results (from the past 240 hour(s))  Resp Panel by RT-PCR (Flu A&B, Covid) Nasopharyngeal Swab     Status: None   Collection Time: 10/14/20  5:45 PM   Specimen: Nasopharyngeal Swab; Nasopharyngeal(NP) swabs in vial transport medium  Result Value Ref Range Status   SARS Coronavirus 2 by RT PCR NEGATIVE NEGATIVE Final    Comment: (NOTE) SARS-CoV-2 target nucleic acids are NOT DETECTED.  The SARS-CoV-2 RNA is generally detectable in upper respiratory specimens during the acute phase of infection. The lowest concentration of SARS-CoV-2 viral copies this assay can detect is 138 copies/mL. A negative result does not preclude SARS-Cov-2 infection and should not be used as the sole basis for treatment or other patient management decisions. A negative result may occur with  improper specimen collection/handling, submission of specimen other than nasopharyngeal swab, presence of viral mutation(s) within the areas targeted by this assay, and inadequate number of viral copies(<138 copies/mL). A negative result must be combined with clinical observations, patient history, and epidemiological information. The expected result is Negative.  Fact Sheet for Patients:  10/16/20  Fact Sheet for Healthcare Providers:  10/16/20  This test is no t yet  approved or cleared by the [employer] FDA and  has been authorized for detection and/or diagnosis of SARS-CoV-2 by FDA under an Emergency Use Authorization (EUA). This EUA will remain  in effect (meaning this test can be used) for the duration of the COVID-19 declaration under Section 564(b)(1) of the Act, 21 U.S.C.section 360bbb-3(b)(1), unless the authorization is terminated  or revoked sooner.       Influenza A by PCR NEGATIVE NEGATIVE Final   Influenza B by PCR NEGATIVE NEGATIVE Final    Comment: (NOTE) The Xpert Xpress SARS-CoV-2/FLU/RSV plus assay is intended as an aid in the diagnosis of influenza from Nasopharyngeal swab specimens and should not be used as a sole basis for treatment. Nasal washings and aspirates are  unacceptable for Xpert Xpress SARS-CoV-2/FLU/RSV testing.  Fact Sheet for Patients: BloggerCourse.comhttps://www.fda.gov/media/152166/download  Fact Sheet for Healthcare Providers: SeriousBroker.ithttps://www.fda.gov/media/152162/download  This test is not yet approved or cleared by the [place] FDA and has been authorized for detection and/or diagnosis of SARS-CoV-2 by FDA under an Emergency Use Authorization (EUA). This EUA will remain in effect (meaning this test can be used) for the duration of the COVID-19 declaration under Section 564(b)(1) of the Act, 21 U.S.C. section 360bbb-3(b)(1), unless the authorization is terminated or revoked.  Performed at [facility], [address] [place], [place] [address]   Blood culture (routine x 2)     Status: None   Collection Time: 10/14/20  7:59 PM   Specimen: BLOOD  Result Value Ref Range Status   Specimen Description BLOOD RIGHT ASSIST CONTROL  Final   Special Requests   Final    BOTTLES DRAWN AEROBIC AND ANAEROBIC Blood Culture results may not be optimal due to an inadequate volume of blood received in culture bottles   Culture   Final    NO GROWTH 5 DAYS Performed at [facility], [address]  [place], [place] [address]    Report Status 10/19/2020 FINAL  Final  Blood culture (routine x 2)     Status: None   Collection Time: 10/14/20  8:00 PM   Specimen: BLOOD  Result Value Ref Range Status   Specimen Description BLOOD LEFT ASSIST CONTROL  Final   Special Requests   Final    BOTTLES DRAWN AEROBIC AND ANAEROBIC Blood Culture results may not be optimal due to an inadequate volume of blood received in culture bottles   Culture   Final    NO GROWTH 5 DAYS Performed at [facility], [address] [place], [place] [address]    Report Status 10/19/2020 FINAL  Final  Resp Panel by RT-PCR (Flu A&B, Covid) Nasopharyngeal Swab     Status: None   Collection Time: 10/16/20 12:21 PM   Specimen: Nasopharyngeal Swab; Nasopharyngeal(NP) swabs in vial transport medium  Result Value Ref Range Status   SARS Coronavirus 2 by RT PCR NEGATIVE NEGATIVE Final    Comment: (NOTE) SARS-CoV-2 target nucleic acids are NOT DETECTED.  The SARS-CoV-2 RNA is generally detectable in upper respiratory specimens during the acute phase of infection. The lowest concentration of SARS-CoV-2 viral copies this assay can detect is 138 copies/mL. A negative result does not preclude SARS-Cov-2 infection and should not be used as the sole basis for treatment or other patient management decisions. A negative result may occur with  improper specimen collection/handling, submission of specimen other than nasopharyngeal swab, presence of viral mutation(s) within the areas targeted by this assay, and inadequate number of viral copies(<138 copies/mL). A negative result must be combined with clinical observations, patient history, and epidemiological information. The expected result is Negative.  Fact Sheet for Patients:  BloggerCourse.comhttps://www.fda.gov/media/152166/download  Fact Sheet for Healthcare Providers:  SeriousBroker.ithttps://www.fda.gov/media/152162/download  This test is no t yet approved or cleared by the [place] FDA  and  has been authorized for detection and/or diagnosis of SARS-CoV-2 by FDA under an Emergency Use Authorization (EUA). This EUA will remain  in effect (meaning this test can be used) for the duration of the COVID-19 declaration under Section 564(b)(1) of the Act, 21 U.S.C.section 360bbb-3(b)(1), unless the authorization is terminated  or revoked sooner.       Influenza A by PCR NEGATIVE NEGATIVE Final   Influenza B by PCR NEGATIVE NEGATIVE Final  Comment: (NOTE) The Xpert Xpress SARS-CoV-2/FLU/RSV plus assay is intended as an aid in the diagnosis of influenza from Nasopharyngeal swab specimens and should not be used as a sole basis for treatment. Nasal washings and aspirates are unacceptable for Xpert Xpress SARS-CoV-2/FLU/RSV testing.  Fact Sheet for Patients: BloggerCourse.com  Fact Sheet for Healthcare Providers: SeriousBroker.it  This test is not yet approved or cleared by the [place] FDA and has been authorized for detection and/or diagnosis of SARS-CoV-2 by FDA under an Emergency Use Authorization (EUA). This EUA will remain in effect (meaning this test can be used) for the duration of the COVID-19 declaration under Section 564(b)(1) of the Act, 21 U.S.C. section 360bbb-3(b)(1), unless the authorization is terminated or revoked.  Performed at [facility], [address] [place], [place] [address]   Resp Panel by RT-PCR (Flu A&B, Covid) Nasopharyngeal Swab     Status: None   Collection Time: 10/20/20 12:16 AM   Specimen: Nasopharyngeal Swab; Nasopharyngeal(NP) swabs in vial transport medium  Result Value Ref Range Status   SARS Coronavirus 2 by RT PCR NEGATIVE NEGATIVE Final    Comment: (NOTE) SARS-CoV-2 target nucleic acids are NOT DETECTED.  The SARS-CoV-2 RNA is generally detectable in upper respiratory specimens during the acute phase of infection. The lowest concentration of SARS-CoV-2 viral  copies this assay can detect is 138 copies/mL. A negative result does not preclude SARS-Cov-2 infection and should not be used as the sole basis for treatment or other patient management decisions. A negative result may occur with  improper specimen collection/handling, submission of specimen other than nasopharyngeal swab, presence of viral mutation(s) within the areas targeted by this assay, and inadequate number of viral copies(<138 copies/mL). A negative result must be combined with clinical observations, patient history, and epidemiological information. The expected result is Negative.  Fact Sheet for Patients:  BloggerCourse.com  Fact Sheet for Healthcare Providers:  SeriousBroker.it  This test is no t yet approved or cleared by the [place] FDA and  has been authorized for detection and/or diagnosis of SARS-CoV-2 by FDA under an Emergency Use Authorization (EUA). This EUA will remain  in effect (meaning this test can be used) for the duration of the COVID-19 declaration under Section 564(b)(1) of the Act, 21 U.S.C.section 360bbb-3(b)(1), unless the authorization is terminated  or revoked sooner.       Influenza A by PCR NEGATIVE NEGATIVE Final   Influenza B by PCR NEGATIVE NEGATIVE Final    Comment: (NOTE) The Xpert Xpress SARS-CoV-2/FLU/RSV plus assay is intended as an aid in the diagnosis of influenza from Nasopharyngeal swab specimens and should not be used as a sole basis for treatment. Nasal washings and aspirates are unacceptable for Xpert Xpress SARS-CoV-2/FLU/RSV testing.  Fact Sheet for Patients: BloggerCourse.com  Fact Sheet for Healthcare Providers: SeriousBroker.it  This test is not yet approved or cleared by the [place] FDA and has been authorized for detection and/or diagnosis of SARS-CoV-2 by FDA under an Emergency Use Authorization (EUA). This  EUA will remain in effect (meaning this test can be used) for the duration of the COVID-19 declaration under Section 564(b)(1) of the Act, 21 U.S.C. section 360bbb-3(b)(1), unless the authorization is terminated or revoked.  Performed at [facility], [address], [place], [place] [address]          Radiology Studies: No results found.      Scheduled Meds:  amitriptyline  25 mg Oral QHS   aspirin EC  81 mg Oral Daily  atorvastatin  20 mg Oral QHS   cholecalciferol  1,000 Units Oral Daily   ferrous sulfate  325 mg Oral QODAY   FLUoxetine  80 mg Oral Daily   nicotine  21 mg Transdermal Daily   nystatin  1 application Topical BID   pantoprazole  40 mg Oral Daily   prasugrel  10 mg Oral Daily   senna-docusate  2 tablet Oral Daily   sodium chloride flush  3 mL Intravenous Q12H   vitamin B-12  1,000 mcg Oral Daily   Continuous Infusions:  sodium chloride       LOS: 0 days    Time spent: 30 mins    Charise Killian, MD [facility] Pager [phone]  If 7PM-7AM, please contact night-coverage 10/21/2020, 7:33 AM

## 2020-10-22 DIAGNOSIS — R531 Weakness: Secondary | ICD-10-CM | POA: Diagnosis not present

## 2020-10-22 DIAGNOSIS — I739 Peripheral vascular disease, unspecified: Secondary | ICD-10-CM | POA: Diagnosis not present

## 2020-10-22 DIAGNOSIS — F329 Major depressive disorder, single episode, unspecified: Secondary | ICD-10-CM | POA: Diagnosis not present

## 2020-10-22 LAB — BASIC METABOLIC PANEL
Anion gap: 4 — ABNORMAL LOW (ref 5–15)
BUN: 15 mg/dL (ref 8–23)
CO2: 25 mmol/L (ref 22–32)
Calcium: 8.5 mg/dL — ABNORMAL LOW (ref 8.9–10.3)
Chloride: 104 mmol/L (ref 98–111)
Creatinine, Ser: 0.96 mg/dL (ref 0.44–1.00)
GFR, Estimated: >60 mL/min (ref >60)
Glucose, Bld: 99 mg/dL (ref 70–99)
Potassium: 3.8 mmol/L (ref 3.5–5.1)
Sodium: 133 mmol/L — ABNORMAL LOW (ref 135–145)

## 2020-10-22 LAB — CBC
HCT: 22.8 % — ABNORMAL LOW (ref 36.0–46.0)
Hemoglobin: 7.6 g/dL — ABNORMAL LOW (ref 12.0–15.0)
MCH: 27.2 pg (ref 26.0–34.0)
MCHC: 33.3 g/dL (ref 30.0–36.0)
MCV: 81.7 fL (ref 80.0–100.0)
Platelets: 391 10*3/uL (ref 150–400)
RBC: 2.79 MIL/uL — ABNORMAL LOW (ref 3.87–5.11)
RDW: 17.1 % — ABNORMAL HIGH (ref 11.5–15.5)
WBC: 8.3 10*3/uL (ref 4.0–10.5)
nRBC: 0 % (ref 0.0–0.2)

## 2020-10-22 NOTE — Progress Notes (Signed)
PROGRESS NOTE    Jody Lowe  AOZ:[id number] DOB: June 03, 1945 DOA: 10/19/2020 PCP: Idelia SalmSaint-Surin, Tamara F, MD    Assessment & Plan:   Principal Problem:   Generalized weakness Active Problems:   Tobacco use disorder   Peripheral vascular disease (HCC)   Hypertension, benign   Depressive disorder   Anemia   Generalized weakness: likely secondary to physical deconditioning following recent hospitalization. High fall risk. Difficulty taking care of herself at home. PT recs SNF    PVD: s/p recent stent angioplasty to the LLE. Continue on effient, aspirin & statin   Depression: severity unknown. Continue on home dose of fluoxetine   Obesity: BMI 35.1. Complicates overall care & prognosis    Nicotine dependence: nicotine patch to prevent w/drawal. Received smoking cessation counseling    ACD: H&H are labile. Pt denies any black or bloody stools, hematuria, hemoptysis or hematemesis. Pt is on effient and aspirin. Will transfuse if Hb <7.0   Hypokalemia: within normal limits    DVT prophylaxis:  lovenox  Code Status: full  Family Communication:  Disposition Plan: PT recs SNF   Level of care: Med-Surg  Status is: Observation  The patient will require care spanning > 2 midnights and should be moved to inpatient because: Unsafe d/c plan, still waiting on SNF placement   Dispo: The patient is from: Home              Anticipated d/c is to: SNF              Patient currently is medically stable for d/c. Waiting on SNF placement    Difficult to place patient Yes    Consultants:    Procedures:   Antimicrobials:    Subjective: Pt c/o fatigue and generalized weakness  Objective: Vitals:   10/21/20 0734 10/21/20 1646 10/21/20 1942 10/22/20 0445  BP: 130/62 (!) 159/70 (!) 140/57 (!) 145/66  Pulse: 90 80 80 89  Resp: 20 20 20 20   Temp: 98.5 F (36.9 C) 98.8 F (37.1 C) 98.7 F (37.1 C) 98.3 F (36.8 C)  TempSrc: Oral  Oral   SpO2: 100% 94% 100% 97%  Weight:       Height:        Intake/Output Summary (Last 24 hours) at 10/22/2020 0727 Last data filed at 10/21/2020 2314 Gross per 24 hour  Intake 480 ml  Output 600 ml  Net -120 ml   Filed Weights   10/19/20 1757  Weight: 98.9 kg    Examination:  General exam: Appears calm & comfortable  Respiratory system: clear breath sounds b/l  Cardiovascular system: S1 & S2+. No rubs or clicks  Gastrointestinal system: Abd is soft, NT, obese & hypoactive bowel sounds  Central nervous system: Alert and oriented. Moves all extremities  Psychiatry: Judgement and insight appear normal. Flat mood and affect    Data Reviewed: I have personally reviewed following labs and imaging studies  CBC: Recent Labs  Lab 10/17/20 0453 10/19/20 2313 10/20/20 0654 10/21/20 0425 10/22/20 0438  WBC 9.7 12.8* 11.7* 8.9 8.3  NEUTROABS 7.3 9.4*  --   --   --   HGB 9.6* 8.7* 7.8* 7.5* 7.6*  HCT 30.5* 26.0* 23.4* 23.3* 22.8*  MCV 85.9 82.8 81.8 81.2 81.7  PLT 381 424* 380 379 391   Basic Metabolic Panel: Recent Labs  Lab 10/17/20 0453 10/19/20 2313 10/20/20 0654 10/21/20 0425 10/22/20 0438  NA 135 135 134* 133* 133*  K 3.6 3.5 3.4* 3.6 3.8  CL 104  103 102 104 104  CO2 GLUCOSE 76 100* 85 88 99  BUN CREATININE 1.06* 1.04* 0.86 0.95 0.96  CALCIUM 8.8* 8.7* 8.5* 8.4* 8.5*   GFR: Estimated Creatinine Clearance: 59.1 mL/min (by C-G formula based on SCr of 0.96 mg/dL). Liver Function Tests: No results for input(s): AST, ALT, ALKPHOS, BILITOT, PROT, ALBUMIN in the last 168 hours.  No results for input(s): LIPASE, AMYLASE in the last 168 hours. No results for input(s): AMMONIA in the last 168 hours. Coagulation Profile: No results for input(s): INR, PROTIME in the last 168 hours.  Cardiac Enzymes: No results for input(s): CKTOTAL, CKMB, CKMBINDEX, TROPONINI in the last 168 hours.  BNP (last 3 results) No results for input(s): PROBNP in the last 8760 hours. HbA1C: No  results for input(s): HGBA1C in the last 72 hours. CBG: No results for input(s): GLUCAP in the last 168 hours. Lipid Profile: No results for input(s): CHOL, HDL, LDLCALC, TRIG, CHOLHDL, LDLDIRECT in the last 72 hours. Thyroid Function Tests: No results for input(s): TSH, T4TOTAL, FREET4, T3FREE, THYROIDAB in the last 72 hours. Anemia Panel: No results for input(s): VITAMINB12, FOLATE, FERRITIN, TIBC, IRON, RETICCTPCT in the last 72 hours. Sepsis Labs: No results for input(s): PROCALCITON, LATICACIDVEN in the last 168 hours.   Recent Results (from the past 240 hour(s))  Resp Panel by RT-PCR (Flu A&B, Covid) Nasopharyngeal Swab     Status: None   Collection Time: 10/14/20  5:45 PM   Specimen: Nasopharyngeal Swab; Nasopharyngeal(NP) swabs in vial transport medium  Result Value Ref Range Status   SARS Coronavirus 2 by RT PCR NEGATIVE NEGATIVE Final    Comment: (NOTE) SARS-CoV-2 target nucleic acids are NOT DETECTED.  The SARS-CoV-2 RNA is generally detectable in upper respiratory specimens during the acute phase of infection. The lowest concentration of SARS-CoV-2 viral copies this assay can detect is 138 copies/mL. A negative result does not preclude SARS-Cov-2 infection and should not be used as the sole basis for treatment or other patient management decisions. A negative result may occur with  improper specimen collection/handling, submission of specimen other than nasopharyngeal swab, presence of viral mutation(s) within the areas targeted by this assay, and inadequate number of viral copies(<138 copies/mL). A negative result must be combined with clinical observations, patient history, and epidemiological information. The expected result is Negative.  Fact Sheet for Patients:  BloggerCourse.com  Fact Sheet for Healthcare Providers:  SeriousBroker.it  This test is no t yet approved or cleared by the [place] FDA and  has  been authorized for detection and/or diagnosis of SARS-CoV-2 by FDA under an Emergency Use Authorization (EUA). This EUA will remain  in effect (meaning this test can be used) for the duration of the COVID-19 declaration under Section 564(b)(1) of the Act, 21 U.S.C.section 360bbb-3(b)(1), unless the authorization is terminated  or revoked sooner.       Influenza A by PCR NEGATIVE NEGATIVE Final   Influenza B by PCR NEGATIVE NEGATIVE Final    Comment: (NOTE) The Xpert Xpress SARS-CoV-2/FLU/RSV plus assay is intended as an aid in the diagnosis of influenza from Nasopharyngeal swab specimens and should not be used as a sole basis for treatment. Nasal washings and aspirates are unacceptable for Xpert Xpress SARS-CoV-2/FLU/RSV testing.  Fact Sheet for Patients: BloggerCourse.com  Fact Sheet for Healthcare Providers: SeriousBroker.it  This test is not yet approved or cleared by the [place] FDA and has been authorized for detection and/or  diagnosis of SARS-CoV-2 by FDA under an Emergency Use Authorization (EUA). This EUA will remain in effect (meaning this test can be used) for the duration of the COVID-19 declaration under Section 564(b)(1) of the Act, 21 U.S.C. section 360bbb-3(b)(1), unless the authorization is terminated or revoked.  Performed at [facility], [address] [place], [place] [address]   Blood culture (routine x 2)     Status: None   Collection Time: 10/14/20  7:59 PM   Specimen: BLOOD  Result Value Ref Range Status   Specimen Description BLOOD RIGHT ASSIST CONTROL  Final   Special Requests   Final    BOTTLES DRAWN AEROBIC AND ANAEROBIC Blood Culture results may not be optimal due to an inadequate volume of blood received in culture bottles   Culture   Final    NO GROWTH 5 DAYS Performed at [facility], [address] [place], [place] [address]    Report Status 10/19/2020 FINAL   Final  Blood culture (routine x 2)     Status: None   Collection Time: 10/14/20  8:00 PM   Specimen: BLOOD  Result Value Ref Range Status   Specimen Description BLOOD LEFT ASSIST CONTROL  Final   Special Requests   Final    BOTTLES DRAWN AEROBIC AND ANAEROBIC Blood Culture results may not be optimal due to an inadequate volume of blood received in culture bottles   Culture   Final    NO GROWTH 5 DAYS Performed at [facility], [address] [place], [place] [address]    Report Status 10/19/2020 FINAL  Final  Resp Panel by RT-PCR (Flu A&B, Covid) Nasopharyngeal Swab     Status: None   Collection Time: 10/16/20 12:21 PM   Specimen: Nasopharyngeal Swab; Nasopharyngeal(NP) swabs in vial transport medium  Result Value Ref Range Status   SARS Coronavirus 2 by RT PCR NEGATIVE NEGATIVE Final    Comment: (NOTE) SARS-CoV-2 target nucleic acids are NOT DETECTED.  The SARS-CoV-2 RNA is generally detectable in upper respiratory specimens during the acute phase of infection. The lowest concentration of SARS-CoV-2 viral copies this assay can detect is 138 copies/mL. A negative result does not preclude SARS-Cov-2 infection and should not be used as the sole basis for treatment or other patient management decisions. A negative result may occur with  improper specimen collection/handling, submission of specimen other than nasopharyngeal swab, presence of viral mutation(s) within the areas targeted by this assay, and inadequate number of viral copies(<138 copies/mL). A negative result must be combined with clinical observations, patient history, and epidemiological information. The expected result is Negative.  Fact Sheet for Patients:  BloggerCourse.com  Fact Sheet for Healthcare Providers:  SeriousBroker.it  This test is no t yet approved or cleared by the [place] FDA and  has been authorized for detection and/or diagnosis of  SARS-CoV-2 by FDA under an Emergency Use Authorization (EUA). This EUA will remain  in effect (meaning this test can be used) for the duration of the COVID-19 declaration under Section 564(b)(1) of the Act, 21 U.S.C.section 360bbb-3(b)(1), unless the authorization is terminated  or revoked sooner.       Influenza A by PCR NEGATIVE NEGATIVE Final   Influenza B by PCR NEGATIVE NEGATIVE Final    Comment: (NOTE) The Xpert Xpress SARS-CoV-2/FLU/RSV plus assay is intended as an aid in the diagnosis of influenza from Nasopharyngeal swab specimens and should not be used as a sole basis for treatment. Nasal washings and aspirates are unacceptable  for Xpert Xpress SARS-CoV-2/FLU/RSV testing.  Fact Sheet for Patients: BloggerCourse.com  Fact Sheet for Healthcare Providers: SeriousBroker.it  This test is not yet approved or cleared by the [place] FDA and has been authorized for detection and/or diagnosis of SARS-CoV-2 by FDA under an Emergency Use Authorization (EUA). This EUA will remain in effect (meaning this test can be used) for the duration of the COVID-19 declaration under Section 564(b)(1) of the Act, 21 U.S.C. section 360bbb-3(b)(1), unless the authorization is terminated or revoked.  Performed at [facility], [address] [place], [place] [address]   Resp Panel by RT-PCR (Flu A&B, Covid) Nasopharyngeal Swab     Status: None   Collection Time: 10/20/20 12:16 AM   Specimen: Nasopharyngeal Swab; Nasopharyngeal(NP) swabs in vial transport medium  Result Value Ref Range Status   SARS Coronavirus 2 by RT PCR NEGATIVE NEGATIVE Final    Comment: (NOTE) SARS-CoV-2 target nucleic acids are NOT DETECTED.  The SARS-CoV-2 RNA is generally detectable in upper respiratory specimens during the acute phase of infection. The lowest concentration of SARS-CoV-2 viral copies this assay can detect is 138 copies/mL. A negative  result does not preclude SARS-Cov-2 infection and should not be used as the sole basis for treatment or other patient management decisions. A negative result may occur with  improper specimen collection/handling, submission of specimen other than nasopharyngeal swab, presence of viral mutation(s) within the areas targeted by this assay, and inadequate number of viral copies(<138 copies/mL). A negative result must be combined with clinical observations, patient history, and epidemiological information. The expected result is Negative.  Fact Sheet for Patients:  BloggerCourse.com  Fact Sheet for Healthcare Providers:  SeriousBroker.it  This test is no t yet approved or cleared by the [place] FDA and  has been authorized for detection and/or diagnosis of SARS-CoV-2 by FDA under an Emergency Use Authorization (EUA). This EUA will remain  in effect (meaning this test can be used) for the duration of the COVID-19 declaration under Section 564(b)(1) of the Act, 21 U.S.C.section 360bbb-3(b)(1), unless the authorization is terminated  or revoked sooner.       Influenza A by PCR NEGATIVE NEGATIVE Final   Influenza B by PCR NEGATIVE NEGATIVE Final    Comment: (NOTE) The Xpert Xpress SARS-CoV-2/FLU/RSV plus assay is intended as an aid in the diagnosis of influenza from Nasopharyngeal swab specimens and should not be used as a sole basis for treatment. Nasal washings and aspirates are unacceptable for Xpert Xpress SARS-CoV-2/FLU/RSV testing.  Fact Sheet for Patients: BloggerCourse.com  Fact Sheet for Healthcare Providers: SeriousBroker.it  This test is not yet approved or cleared by the [place] FDA and has been authorized for detection and/or diagnosis of SARS-CoV-2 by FDA under an Emergency Use Authorization (EUA). This EUA will remain in effect (meaning this test can be used)  for the duration of the COVID-19 declaration under Section 564(b)(1) of the Act, 21 U.S.C. section 360bbb-3(b)(1), unless the authorization is terminated or revoked.  Performed at [facility], [address] [place], [place] [address]          Radiology Studies: No results found.      Scheduled Meds:  amitriptyline  25 mg Oral QHS   aspirin EC  81 mg Oral Daily   atorvastatin  20 mg Oral QHS   cholecalciferol  1,000 Units Oral Daily   ferrous sulfate  325 mg Oral QODAY   FLUoxetine  80 mg Oral Daily   nicotine  21 mg  Transdermal Daily   nystatin  1 application Topical BID   oxyCODONE  5 mg Oral Once   pantoprazole  40 mg Oral Daily   prasugrel  10 mg Oral Daily   senna-docusate  2 tablet Oral Daily   sodium chloride flush  3 mL Intravenous Q12H   vitamin B-12  1,000 mcg Oral Daily   Continuous Infusions:  sodium chloride       LOS: 0 days    Time spent: 20 mins    Charise Killian, MD [facility] Pager [phone]  If 7PM-7AM, please contact night-coverage 10/22/2020, 7:27 AM

## 2020-10-22 NOTE — TOC Progression Note (Signed)
Transition of Care [facility]) - Progression Note    Patient Details  Name: Jody Lowe MRN: [id number] Date of Birth: [birth year]  [facility] CM/SW Contact  Ashley Royalty Lutricia Feil, RN Phone [phone] 10/22/2020, 11:04 AM  Clinical Narrative:    Spoke with son Joetta Delprado [phone] concerning bed offer via [employer]. Bed offer was accepted and Dr. Mayford Knife receptive to a discharge on Monday for this pt. RN spoke with Archie Patten [facility]) on acceptance and provided Covid vaccinations as followed: 05/06/19, 06/03/19, 02/25/20 1st booster and 07/03/2020 2nd booster. No additional information requested at this time.  Started Engineer, site [phone]. All information faxed to [phone].  Son reports pt was living alone with existing DME raised toilet, 3-1 commode, wheelchair and RW. Support system for son Elveria Rising), daughter-in-law and sister Jeanie Cooks.  TOC will continue to follow up with any ongoing needs prior to discharge to [facility].   Expected Discharge Plan: Skilled Nursing Facility Barriers to Discharge: Continued Medical Work up  Expected Discharge Plan and Services Expected Discharge Plan: Skilled Nursing Facility     Post Acute Care Choice: Skilled Nursing Facility Living arrangements for the past 2 months: Apartment                 DME Arranged: Government social research officer                     Social Determinants of Health (SDOH) Interventions    Readmission Risk Interventions No flowsheet data found.

## 2020-10-23 ENCOUNTER — Ambulatory Visit: Admission: RE | Admit: 2020-10-23 | Payer: Medicare Other | Source: Home / Self Care | Admitting: Vascular Surgery

## 2020-10-23 ENCOUNTER — Other Ambulatory Visit (INDEPENDENT_AMBULATORY_CARE_PROVIDER_SITE_OTHER): Payer: Self-pay | Admitting: Nurse Practitioner

## 2020-10-23 ENCOUNTER — Other Ambulatory Visit (INDEPENDENT_AMBULATORY_CARE_PROVIDER_SITE_OTHER): Payer: Self-pay | Admitting: Vascular Surgery

## 2020-10-23 ENCOUNTER — Encounter
Admission: EM | Disposition: A | Discharge status: Skilled Nursing Facility | Payer: Medicare Other | Source: Home / Self Care | Attending: Emergency Medicine

## 2020-10-23 DIAGNOSIS — R42 Dizziness and giddiness: Secondary | ICD-10-CM | POA: Diagnosis not present

## 2020-10-23 DIAGNOSIS — I7092 Chronic total occlusion of artery of the extremities: Secondary | ICD-10-CM | POA: Diagnosis not present

## 2020-10-23 DIAGNOSIS — I739 Peripheral vascular disease, unspecified: Secondary | ICD-10-CM

## 2020-10-23 DIAGNOSIS — I70223 Atherosclerosis of native arteries of extremities with rest pain, bilateral legs: Secondary | ICD-10-CM | POA: Diagnosis not present

## 2020-10-23 DIAGNOSIS — R531 Weakness: Secondary | ICD-10-CM | POA: Diagnosis not present

## 2020-10-23 HISTORY — PX: LOWER EXTREMITY ANGIOGRAPHY: CATH118251

## 2020-10-23 LAB — CBC
HCT: 24.5 % — ABNORMAL LOW (ref 36.0–46.0)
Hemoglobin: 8.1 g/dL — ABNORMAL LOW (ref 12.0–15.0)
MCH: 26.8 pg (ref 26.0–34.0)
MCHC: 33.1 g/dL (ref 30.0–36.0)
MCV: 81.1 fL (ref 80.0–100.0)
Platelets: 425 10*3/uL — ABNORMAL HIGH (ref 150–400)
RBC: 3.02 MIL/uL — ABNORMAL LOW (ref 3.87–5.11)
RDW: 17.3 % — ABNORMAL HIGH (ref 11.5–15.5)
WBC: 7.6 10*3/uL (ref 4.0–10.5)
nRBC: 0 % (ref 0.0–0.2)

## 2020-10-23 LAB — BASIC METABOLIC PANEL
Anion gap: 6 (ref 5–15)
BUN: 13 mg/dL (ref 8–23)
CO2: 25 mmol/L (ref 22–32)
Calcium: 8.5 mg/dL — ABNORMAL LOW (ref 8.9–10.3)
Chloride: 102 mmol/L (ref 98–111)
Creatinine, Ser: 1.01 mg/dL — ABNORMAL HIGH (ref 0.44–1.00)
GFR, Estimated: 58 mL/min — ABNORMAL LOW (ref >60)
Glucose, Bld: 90 mg/dL (ref 70–99)
Potassium: 3.6 mmol/L (ref 3.5–5.1)
Sodium: 133 mmol/L — ABNORMAL LOW (ref 135–145)

## 2020-10-23 LAB — GLUCOSE, CAPILLARY: Glucose-Capillary: 90 mg/dL (ref 70–99)

## 2020-10-23 SURGERY — LOWER EXTREMITY ANGIOGRAPHY
Anesthesia: Moderate Sedation | Site: Leg Lower | Laterality: Right

## 2020-10-23 MED ORDER — MIDAZOLAM HCL 2 MG/2ML IJ SOLN
INTRAMUSCULAR | Status: AC
  Filled 2020-10-23: qty 2

## 2020-10-23 MED ORDER — SODIUM CHLORIDE 0.9 % IV SOLN: INTRAVENOUS | Status: DC

## 2020-10-23 MED ORDER — HYDROMORPHONE HCL 1 MG/ML IJ SOLN: 1.0000 mg | Freq: Once | INTRAMUSCULAR | Status: DC | PRN

## 2020-10-23 MED ORDER — CEFAZOLIN SODIUM-DEXTROSE 2-4 GM/100ML-% IV SOLN
2.0000 g | Freq: Once | INTRAVENOUS | Status: DC
  Filled 2020-10-23: qty 100

## 2020-10-23 MED ORDER — METHYLPREDNISOLONE SODIUM SUCC 125 MG IJ SOLR: 125.0000 mg | Freq: Once | INTRAMUSCULAR | Status: DC | PRN

## 2020-10-23 MED ORDER — MIDAZOLAM HCL 2 MG/2ML IJ SOLN
INTRAMUSCULAR | Status: DC | PRN
  Administered 2020-10-23: 1 mg via INTRAVENOUS
  Administered 2020-10-23: 2 mg via INTRAVENOUS

## 2020-10-23 MED ORDER — FENTANYL CITRATE (PF) 100 MCG/2ML IJ SOLN
INTRAMUSCULAR | Status: AC
  Filled 2020-10-23: qty 2

## 2020-10-23 MED ORDER — MECLIZINE HCL 25 MG PO TABS
25.0000 mg | ORAL_TABLET | Freq: Two times a day (BID) | ORAL | Status: DC
  Administered 2020-10-23 – 2020-10-24 (×2): 25 mg via ORAL
  Filled 2020-10-23 (×4): qty 1

## 2020-10-23 MED ORDER — FENTANYL CITRATE (PF) 100 MCG/2ML IJ SOLN
INTRAMUSCULAR | Status: DC | PRN
  Administered 2020-10-23 (×3): 50 ug via INTRAVENOUS

## 2020-10-23 MED ORDER — IODIXANOL 320 MG/ML IV SOLN
INTRAVENOUS | Status: DC | PRN
  Administered 2020-10-23: 30 mL

## 2020-10-23 MED ORDER — DIPHENHYDRAMINE HCL 50 MG/ML IJ SOLN: 50.0000 mg | Freq: Once | INTRAMUSCULAR | Status: DC | PRN

## 2020-10-23 MED ORDER — HEPARIN SODIUM (PORCINE) 1000 UNIT/ML IJ SOLN
INTRAMUSCULAR | Status: AC
  Filled 2020-10-23: qty 1

## 2020-10-23 MED ORDER — ONDANSETRON HCL 4 MG/2ML IJ SOLN: 4.0000 mg | Freq: Four times a day (QID) | INTRAMUSCULAR | Status: DC | PRN

## 2020-10-23 MED ORDER — HEPARIN SODIUM (PORCINE) 1000 UNIT/ML IJ SOLN
INTRAMUSCULAR | Status: DC | PRN
  Administered 2020-10-23: 5000 [IU] via INTRAVENOUS

## 2020-10-23 MED ORDER — FAMOTIDINE 20 MG PO TABS: 40.0000 mg | ORAL_TABLET | Freq: Once | ORAL | Status: DC | PRN

## 2020-10-23 MED ORDER — CEFAZOLIN SODIUM-DEXTROSE 2-4 GM/100ML-% IV SOLN
INTRAVENOUS | Status: AC
  Administered 2020-10-23: 2 g via INTRAVENOUS
  Filled 2020-10-23: qty 100

## 2020-10-23 MED ORDER — MIDAZOLAM HCL 2 MG/ML PO SYRP
8.0000 mg | ORAL_SOLUTION | Freq: Once | ORAL | Status: DC | PRN
  Filled 2020-10-23: qty 4

## 2020-10-23 SURGICAL SUPPLY — 15 items
CATH ANGIO 5F PIGTAIL 65CM (CATHETERS) ×2 IMPLANT
CATH KUMPE SOFT-VU 5FR 65 (CATHETERS) ×2 IMPLANT
CATH VERT 5X100 (CATHETERS) ×2 IMPLANT
COVER PROBE U/S 5X48 (MISCELLANEOUS) ×2 IMPLANT
DEVICE STARCLOSE SE CLOSURE (Vascular Products) ×2 IMPLANT
DEVICE TORQUE (MISCELLANEOUS) ×2 IMPLANT
GLIDEWIRE ADV .035X260CM (WIRE) ×2 IMPLANT
GLIDEWIRE STIFF .35X180X3 HYDR (WIRE) ×2 IMPLANT
KIT ENCORE 26 ADVANTAGE (KITS) ×2 IMPLANT
PACK ANGIOGRAPHY (CUSTOM PROCEDURE TRAY) ×2 IMPLANT
SHEATH ANL2 6FRX45 HC (SHEATH) ×2 IMPLANT
SHEATH BRITE TIP 5FRX11 (SHEATH) ×2 IMPLANT
SYR MEDRAD MARK 7 150ML (SYRINGE) ×2 IMPLANT
WIRE G V18X300CM (WIRE) ×2 IMPLANT
WIRE GUIDERIGHT .035X150 (WIRE) ×2 IMPLANT

## 2020-10-23 NOTE — Op Note (Signed)
[facility]  Percutaneous Study/Intervention Procedural Note   Date of Surgery: 10/23/2020  Surgeon(s):Dura Mccormack    Assistants:none  Pre-operative Diagnosis: PAD with claudication and rest pain bilateral lower extremities  Post-operative diagnosis:  Same  Procedure(s) Performed:             1.  Ultrasound guidance for vascular access left femoral artery             2.  Catheter placement into right SFA from left femoral approach             3.  Aortogram and selective right lower extremity angiogram             4.  Attempted recanalization of a chronically occluded right SFA with a previously placed stent fracture, unable to cross the lesion             5. StarClose closure device left femoral artery  EBL: 10 cc  Contrast: 30 cc  Fluoro Time: 15.3 minutes  Moderate Conscious Sedation Time: approximately 33 minutes using 3 mg of Versed and 150 mcg of Fentanyl              Indications:  Patient is a 75 y.o.female with severe peripheral arterial disease with previous interventions at another institution and symptoms of short distance claudication and some early rest pain of both lower extremities.  She has already undergone left lower extremity revascularization last week. The patient is brought in for angiography for further evaluation and potential treatment.  Due to the limb threatening nature of the situation, angiogram was performed for attempted limb salvage. The patient is aware that if the procedure fails, amputation would be expected.  The patient also understands that even with successful revascularization, amputation may still be required due to the severity of the situation.  Risks and benefits are discussed and informed consent is obtained.   Procedure:  The patient was identified and appropriate procedural time out was performed.  The patient was then placed supine on the table and prepped and draped in the usual sterile fashion. Moderate conscious  sedation was administered during a face to face encounter with the patient throughout the procedure with my supervision of the RN administering medicines and monitoring the patient's vital signs, pulse oximetry, telemetry and mental status throughout from the start of the procedure until the patient was taken to the recovery room. Ultrasound was used to evaluate the left common femoral artery.  It was patent .  A digital ultrasound image was acquired.  A Seldinger needle was used to access the left common femoral artery under direct ultrasound guidance and a permanent image was performed.  A 0.035 J wire was advanced without resistance and a 5Fr sheath was placed.  Pigtail catheter was placed into the aorta and an AP aortogram was performed. This demonstrated fairly normal renal arteries with mild disease on the left.  Aorta and iliac arteries were calcific but not stenotic. I then crossed the aortic bifurcation and advanced to the right femoral head. Selective right lower extremity angiogram was then performed. This demonstrated fairly normal common femoral artery with some disease in the profunda femoris artery although it appeared relatively mild.  The SFA was occluded about 1 to 2 cm beyond its origin at the location of a previously placed stent.  The stent had a very significant stent fracture where it look like separation of the stent for almost a centimeter and it actually appeared fractured in 2 locations.  The stent was occluded all the way down to the popliteal artery which reconstituted below the previously placed stent.  The posterior tibial artery was the only vessel continuous distally but it was a large vessel although it did occlude at the ankle all the collaterals were filling the foot. It was felt that it was in the patient's best interest to proceed with intervention after these images to avoid a second procedure and a larger amount of contrast and fluoroscopy based off of the findings from the  initial angiogram. The patient was systemically heparinized and a 6 [place] An[employer] sheath was then placed over the [employer] wire. I then used a Kumpe catheter and the advantage wire to get into the SFA but despite trying an advantage wire and a stiff angled Glidewire, I was never able to get through the occlusion in the proximal portion of the SFA stent.  The stent fracture would not let us in the lumen and the scar tissue was extremely dense like concrete.  Any advancement caused significant pain to the patient, and we were never able to get very far into the occlusion. I elected to terminate the procedure.  Consideration for open surgical therapy can be given to the patient at a later date.  The sheath was removed and StarClose closure device was deployed in the left femoral artery with excellent hemostatic result. The patient was taken to the recovery room in stable condition having tolerated the procedure well.  Findings:               Aortogram: Fairly normal renal arteries with mild disease on the left.  Aorta and iliac arteries were calcific but not stenotic.             Right lower Extremity: This demonstrated fairly normal common femoral artery with some disease in the profunda femoris artery although it appeared relatively mild.  The SFA was occluded about 1 to 2 cm beyond its origin at the location of a previously placed stent.  The stent had a very significant stent fracture where it look like separation of the stent for almost a centimeter and it actually appeared fractured in 2 locations.  The stent was occluded all the way down to the popliteal artery which reconstituted below the previously placed stent.  The posterior tibial artery was the only vessel continuous distally but it was a large vessel although it did occlude at the ankle all the collaterals were filling the foot.   Disposition: Patient was taken to the recovery room in stable condition having tolerated the procedure  well.  Complications: None  Festus Barren 10/23/2020 5:37 PM   This note was created with Dragon Medical transcription system. Any errors in dictation are purely unintentional.

## 2020-10-23 NOTE — Progress Notes (Signed)
PROGRESS NOTE    Jody Lowe  WFU:[id number] DOB: [birth year] DOA: 10/19/2020 PCP: Idelia Salm, MD    Assessment & Plan:   Principal Problem:   Generalized weakness Active Problems:   Tobacco use disorder   Peripheral vascular disease (HCC)   Hypertension, benign   Depressive disorder   Anemia   Generalized weakness: likely secondary to physical deconditioning following recent hospitalization. High fall risk. Difficulty taking care of herself at home. PT recs SNF   Dizziness/lightheadedness: started on scheduled meclizine    PVD: s/p recent stent angioplasty to the LLE. Continue on effient, aspirin & statin   Depression: severity unknown. Continue on home dose of fluoxetine    Obesity: BMI 35.1. Complicates overall care & prognosis    Nicotine dependence: nicotine patch to prevent w/drawal. Received smoking cessation counseling    ACD: H&H are labile. No indication for transfusion currently.  Pt denies any black or bloody stools, hematuria, hemoptysis or hematemesis. Pt is on effient and aspirin.   Hypokalemia: WNL today    DVT prophylaxis:  lovenox  Code Status: full  Family Communication:  Disposition Plan: PT recs SNF   Level of care: Med-Surg  Status is: Observation  The patient will require care spanning > 2 midnights and should be moved to inpatient because: Unsafe d/c plan, dizziness/lightheadedness today, will go to SNF tomorrow  Dispo: The patient is from: Home              Anticipated d/c is to: SNF              Patient currently is not medically stable for d/c.  Will go to SNF tomorrow   Difficult to place patient Yes    Consultants:    Procedures:   Antimicrobials:    Subjective: Pt c/o lightheadedness   Objective: Vitals:   10/22/20 0445 10/22/20 1539 10/22/20 1935 10/23/20 0519  BP: (!) 145/66 (!) 117/57 (!) 142/63 134/64  Pulse: 89 85 79 80  Resp: 20 18 20 16   Temp: 98.3 F (36.8 C) 98 F (36.7 C) 98.2 F (36.8 C)  99 F (37.2 C)  TempSrc:  Oral Oral   SpO2: 97% 100% 100% 99%  Weight:      Height:        Intake/Output Summary (Last 24 hours) at 10/23/2020 0732 Last data filed at 10/22/2020 1700 Gross per 24 hour  Intake 240 ml  Output 700 ml  Net -460 ml   Filed Weights   10/19/20 1757  Weight: 98.9 kg    Examination:  General exam: Appears comfortable. Frail appearing  Respiratory system: diminished breath sounds b/l Cardiovascular system: S1/S2+. No rubs or clicks  Gastrointestinal system: Abd is soft, NT, distended & hypoactive bowel sounds Central nervous system: Alert and oriented. Moves all extremities  Psychiatry: judgement and insight appear normal. Flat mood and affect    Data Reviewed: I have personally reviewed following labs and imaging studies  CBC: Recent Labs  Lab 10/17/20 0453 10/19/20 2313 10/20/20 0654 10/21/20 0425 10/22/20 0438 10/23/20 0427  WBC 9.7 12.8* 11.7* 8.9 8.3 7.6  NEUTROABS 7.3 9.4*  --   --   --   --   HGB 9.6* 8.7* 7.8* 7.5* 7.6* 8.1*  HCT 30.5* 26.0* 23.4* 23.3* 22.8* 24.5*  MCV 85.9 82.8 81.8 81.2 81.7 81.1  PLT 381 424* 380 379 391 425*   Basic Metabolic Panel: Recent Labs  Lab 10/19/20 2313 10/20/20 0654 10/21/20 0425 10/22/20 0438 10/23/20 0427  NA  135 134* 133* 133* 133*  K 3.5 3.4* 3.6 3.8 3.6  CL 103 102 104 104 102  CO2 GLUCOSE 100* 85 88 99 90  BUN CREATININE 1.04* 0.86 0.95 0.96 1.01*  CALCIUM 8.7* 8.5* 8.4* 8.5* 8.5*   GFR: Estimated Creatinine Clearance: 56.2 mL/min (A) (by C-G formula based on SCr of 1.01 mg/dL (H)). Liver Function Tests: No results for input(s): AST, ALT, ALKPHOS, BILITOT, PROT, ALBUMIN in the last 168 hours.  No results for input(s): LIPASE, AMYLASE in the last 168 hours. No results for input(s): AMMONIA in the last 168 hours. Coagulation Profile: No results for input(s): INR, PROTIME in the last 168 hours.  Cardiac Enzymes: No results for input(s): CKTOTAL,  CKMB, CKMBINDEX, TROPONINI in the last 168 hours.  BNP (last 3 results) No results for input(s): PROBNP in the last 8760 hours. HbA1C: No results for input(s): HGBA1C in the last 72 hours. CBG: No results for input(s): GLUCAP in the last 168 hours. Lipid Profile: No results for input(s): CHOL, HDL, LDLCALC, TRIG, CHOLHDL, LDLDIRECT in the last 72 hours. Thyroid Function Tests: No results for input(s): TSH, T4TOTAL, FREET4, T3FREE, THYROIDAB in the last 72 hours. Anemia Panel: No results for input(s): VITAMINB12, FOLATE, FERRITIN, TIBC, IRON, RETICCTPCT in the last 72 hours. Sepsis Labs: No results for input(s): PROCALCITON, LATICACIDVEN in the last 168 hours.   Recent Results (from the past 240 hour(s))  Resp Panel by RT-PCR (Flu A&B, Covid) Nasopharyngeal Swab     Status: None   Collection Time: 10/14/20  5:45 PM   Specimen: Nasopharyngeal Swab; Nasopharyngeal(NP) swabs in vial transport medium  Result Value Ref Range Status   SARS Coronavirus 2 by RT PCR NEGATIVE NEGATIVE Final    Comment: (NOTE) SARS-CoV-2 target nucleic acids are NOT DETECTED.  The SARS-CoV-2 RNA is generally detectable in upper respiratory specimens during the acute phase of infection. The lowest concentration of SARS-CoV-2 viral copies this assay can detect is 138 copies/mL. A negative result does not preclude SARS-Cov-2 infection and should not be used as the sole basis for treatment or other patient management decisions. A negative result may occur with  improper specimen collection/handling, submission of specimen other than nasopharyngeal swab, presence of viral mutation(s) within the areas targeted by this assay, and inadequate number of viral copies(<138 copies/mL). A negative result must be combined with clinical observations, patient history, and epidemiological information. The expected result is Negative.  Fact Sheet for Patients:  BloggerCourse.com  Fact Sheet for  Healthcare Providers:  SeriousBroker.it  This test is no t yet approved or cleared by the [place] FDA and  has been authorized for detection and/or diagnosis of SARS-CoV-2 by FDA under an Emergency Use Authorization (EUA). This EUA will remain  in effect (meaning this test can be used) for the duration of the COVID-19 declaration under Section 564(b)(1) of the Act, 21 U.S.C.section 360bbb-3(b)(1), unless the authorization is terminated  or revoked sooner.       Influenza A by PCR NEGATIVE NEGATIVE Final   Influenza B by PCR NEGATIVE NEGATIVE Final    Comment: (NOTE) The Xpert Xpress SARS-CoV-2/FLU/RSV plus assay is intended as an aid in the diagnosis of influenza from Nasopharyngeal swab specimens and should not be used as a sole basis for treatment. Nasal washings and aspirates are unacceptable for Xpert Xpress SARS-CoV-2/FLU/RSV testing.  Fact Sheet for Patients: BloggerCourse.com  Fact Sheet for Healthcare Providers: SeriousBroker.it  This test is  not yet approved or cleared by the [place] and has been authorized for detection and/or diagnosis of SARS-CoV-2 by FDA under an Emergency Use Authorization (EUA). This EUA will remain in effect (meaning this test can be used) for the duration of the COVID-19 declaration under Section 564(b)(1) of the Act, 21 U.S.C. section 360bbb-3(b)(1), unless the authorization is terminated or revoked.  Performed at [facility], [address] [place], [place] [address]   Blood culture (routine x 2)     Status: None   Collection Time: 10/14/20  7:59 PM   Specimen: BLOOD  Result Value Ref Range Status   Specimen Description BLOOD RIGHT ASSIST CONTROL  Final   Special Requests   Final    BOTTLES DRAWN AEROBIC AND ANAEROBIC Blood Culture results may not be optimal due to an inadequate volume of blood received in culture bottles   Culture    Final    NO GROWTH 5 DAYS Performed at [facility], [address] [place], [place] [address]    Report Status 10/19/2020 FINAL  Final  Blood culture (routine x 2)     Status: None   Collection Time: 10/14/20  8:00 PM   Specimen: BLOOD  Result Value Ref Range Status   Specimen Description BLOOD LEFT ASSIST CONTROL  Final   Special Requests   Final    BOTTLES DRAWN AEROBIC AND ANAEROBIC Blood Culture results may not be optimal due to an inadequate volume of blood received in culture bottles   Culture   Final    NO GROWTH 5 DAYS Performed at [facility], [address] [place], [place] [address]    Report Status 10/19/2020 FINAL  Final  Resp Panel by RT-PCR (Flu A&B, Covid) Nasopharyngeal Swab     Status: None   Collection Time: 10/16/20 12:21 PM   Specimen: Nasopharyngeal Swab; Nasopharyngeal(NP) swabs in vial transport medium  Result Value Ref Range Status   SARS Coronavirus 2 by RT PCR NEGATIVE NEGATIVE Final    Comment: (NOTE) SARS-CoV-2 target nucleic acids are NOT DETECTED.  The SARS-CoV-2 RNA is generally detectable in upper respiratory specimens during the acute phase of infection. The lowest concentration of SARS-CoV-2 viral copies this assay can detect is 138 copies/mL. A negative result does not preclude SARS-Cov-2 infection and should not be used as the sole basis for treatment or other patient management decisions. A negative result may occur with  improper specimen collection/handling, submission of specimen other than nasopharyngeal swab, presence of viral mutation(s) within the areas targeted by this assay, and inadequate number of viral copies(<138 copies/mL). A negative result must be combined with clinical observations, patient history, and epidemiological information. The expected result is Negative.  Fact Sheet for Patients:  BloggerCourse.com  Fact Sheet for Healthcare Providers:   SeriousBroker.it  This test is no t yet approved or cleared by the [place] FDA and  has been authorized for detection and/or diagnosis of SARS-CoV-2 by FDA under an Emergency Use Authorization (EUA). This EUA will remain  in effect (meaning this test can be used) for the duration of the COVID-19 declaration under Section 564(b)(1) of the Act, 21 U.S.C.section 360bbb-3(b)(1), unless the authorization is terminated  or revoked sooner.       Influenza A by PCR NEGATIVE NEGATIVE Final   Influenza B by PCR NEGATIVE NEGATIVE Final    Comment: (NOTE) The Xpert Xpress SARS-CoV-2/FLU/RSV plus assay is intended as an aid in the diagnosis of influenza from Nasopharyngeal swab specimens  and should not be used as a sole basis for treatment. Nasal washings and aspirates are unacceptable for Xpert Xpress SARS-CoV-2/FLU/RSV testing.  Fact Sheet for Patients: BloggerCourse.com  Fact Sheet for Healthcare Providers: SeriousBroker.it  This test is not yet approved or cleared by the [place] FDA and has been authorized for detection and/or diagnosis of SARS-CoV-2 by FDA under an Emergency Use Authorization (EUA). This EUA will remain in effect (meaning this test can be used) for the duration of the COVID-19 declaration under Section 564(b)(1) of the Act, 21 U.S.C. section 360bbb-3(b)(1), unless the authorization is terminated or revoked.  Performed at [facility], [address] [place], [place] [address]   Resp Panel by RT-PCR (Flu A&B, Covid) Nasopharyngeal Swab     Status: None   Collection Time: 10/20/20 12:16 AM   Specimen: Nasopharyngeal Swab; Nasopharyngeal(NP) swabs in vial transport medium  Result Value Ref Range Status   SARS Coronavirus 2 by RT PCR NEGATIVE NEGATIVE Final    Comment: (NOTE) SARS-CoV-2 target nucleic acids are NOT DETECTED.  The SARS-CoV-2 RNA is generally detectable  in upper respiratory specimens during the acute phase of infection. The lowest concentration of SARS-CoV-2 viral copies this assay can detect is 138 copies/mL. A negative result does not preclude SARS-Cov-2 infection and should not be used as the sole basis for treatment or other patient management decisions. A negative result may occur with  improper specimen collection/handling, submission of specimen other than nasopharyngeal swab, presence of viral mutation(s) within the areas targeted by this assay, and inadequate number of viral copies(<138 copies/mL). A negative result must be combined with clinical observations, patient history, and epidemiological information. The expected result is Negative.  Fact Sheet for Patients:  BloggerCourse.com  Fact Sheet for Healthcare Providers:  SeriousBroker.it  This test is no t yet approved or cleared by the [place] FDA and  has been authorized for detection and/or diagnosis of SARS-CoV-2 by FDA under an Emergency Use Authorization (EUA). This EUA will remain  in effect (meaning this test can be used) for the duration of the COVID-19 declaration under Section 564(b)(1) of the Act, 21 U.S.C.section 360bbb-3(b)(1), unless the authorization is terminated  or revoked sooner.       Influenza A by PCR NEGATIVE NEGATIVE Final   Influenza B by PCR NEGATIVE NEGATIVE Final    Comment: (NOTE) The Xpert Xpress SARS-CoV-2/FLU/RSV plus assay is intended as an aid in the diagnosis of influenza from Nasopharyngeal swab specimens and should not be used as a sole basis for treatment. Nasal washings and aspirates are unacceptable for Xpert Xpress SARS-CoV-2/FLU/RSV testing.  Fact Sheet for Patients: BloggerCourse.com  Fact Sheet for Healthcare Providers: SeriousBroker.it  This test is not yet approved or cleared by the [place] FDA and has  been authorized for detection and/or diagnosis of SARS-CoV-2 by FDA under an Emergency Use Authorization (EUA). This EUA will remain in effect (meaning this test can be used) for the duration of the COVID-19 declaration under Section 564(b)(1) of the Act, 21 U.S.C. section 360bbb-3(b)(1), unless the authorization is terminated or revoked.  Performed at [facility], [address] [place], [place] [address]          Radiology Studies: No results found.      Scheduled Meds:  amitriptyline  25 mg Oral QHS   aspirin EC  81 mg Oral Daily   atorvastatin  20 mg Oral QHS   cholecalciferol  1,000 Units Oral Daily   ferrous sulfate  325  mg Oral QODAY   FLUoxetine  80 mg Oral Daily   nicotine  21 mg Transdermal Daily   nystatin  1 application Topical BID   oxyCODONE  5 mg Oral Once   pantoprazole  40 mg Oral Daily   prasugrel  10 mg Oral Daily   senna-docusate  2 tablet Oral Daily   sodium chloride flush  3 mL Intravenous Q12H   vitamin B-12  1,000 mcg Oral Daily   Continuous Infusions:  sodium chloride       LOS: 0 days    Time spent: 20 mins    Charise KillianJamiese M Valerie Cones, MD [facility] Pager [phone]  If 7PM-7AM, please contact night-coverage 10/23/2020, 7:32 AM

## 2020-10-23 NOTE — Progress Notes (Signed)
PIV consult: No current IV meds ordered. Pt asking why she needs a new site. Discussed with RN, recommend holding off on new site unless pt requires IV med or infusion.

## 2020-10-23 NOTE — Progress Notes (Signed)
Physical Therapy Treatment Patient Details Name: Jody Lowe MRN: [id number] DOB: Apr 23, 1945 Today's Date: 10/23/2020    History of Present Illness presented to ER from home secondary to progressive weakness and inability to care for self in home environment; admitted for management/work up of weakness (possible deconditioning due to recent hospitalization per chart) and bilat LE pain.    PT Comments    Pt tolerated treatment fair today, with further mobility/participation limited secondary to delayed onset of lightheadedness with upright mobility. Pt required mod I for bed mobility, CGA for transfers, and CGA-supervision for short distance gait with rollator. Pt with x2 episodes of lightheadedness during gait requiring quick stand>sit transfer to rollator for safety. Lightheadedness able to subside within a minute, with seated rest break and cues for PLB. Vitals WNL. Vestibular screen performed which was positive for smooth pursuits; decreased L and down tracking noted bil with c/o nystagmus. Pt may benefit from further vestibular work up to rule out central pathology; care team notified. Pt will continue to benefit from skilled acute PT services to address deficits for return to baseline function. Will continue to recommend SNF at DC.     Follow Up Recommendations  SNF     Equipment Recommendations  Rolling walker with 5" wheels       Precautions / Restrictions Precautions Precautions: Fall Precaution Comments: delayed lightheadedness with upright mobility Restrictions Weight Bearing Restrictions: No    Mobility  Bed Mobility Overal bed mobility: Modified Independent             General bed mobility comments: Mod I to sit EOB with use of BUE for support and HOB flat    Transfers Overall transfer level: Needs assistance Equipment used: 4-wheeled walker Transfers: Sit to/from Stand Sit to Stand: Min guard         General transfer comment: CGA for safety to perform  STS transfers from EOB and rollator x2. Multimodal cues for locking/unlocking brakes and hand placement. Increased time/effort to achieve full upright standing.  Ambulation/Gait Ambulation/Gait assistance: Min guard;Supervision Gait Distance (Feet): 50 Feet (54ft x1, 6ft x1) Assistive device: 4-wheeled walker       General Gait Details: Intermittent assist ranging from supervision-CGA for safety to ambulate with rollator. Pt demonstrates slowed cadence, decreased step length/foot clearance bil (R>L), and mild forward flexed posture. x2 seated rest breaks required due to c/o mild lightheadedness; vitals WNL.     Balance Overall balance assessment: Needs assistance Sitting-balance support: No upper extremity supported;Feet supported Sitting balance-Leahy Scale: Good     Standing balance support: Bilateral upper extremity supported Standing balance-Leahy Scale: Fair Standing balance comment: Initially fair progressing to "poor" with episodes of lightheadedness       Cognition Arousal/Alertness: Awake/alert Behavior During Therapy: WFL for tasks assessed/performed Overall Cognitive Status: Within Functional Limits for tasks assessed        General Comments: able to follow 100% of simple 2-step commands      Exercises Other Exercises Other Exercises: Pt able to participate in bed mobility, transfers, and multiple bouts of gait with RW. Required grossly supervision-CGA for safety with transfers and gait. Gait limited secondary to delayed onset of lightheadedness during mobility. Other Exercises: Vestibular screen performed. Saccades and VOR x1 negative and pt asymptomatic; however, VOR x1 test limited as pt was unable to achieve enough velocity with head turns to elicit vestibular response. Smooth pursuits positive for decreased tracking to the L and down, with pt demonstrating corrective saccades and c/o nystagmus. Pt may benefit from  further vestibular work up to rule out central  pathology. Other Exercises: Pt educated regarding: PT role/POC, DC recommendations, safety with mobility (ie, locking breaks, hand placement, LE placement), and vestibular screen/findings. Pt verbalized understanding.        Pertinent Vitals/Pain Pain Assessment: 0-10 Pain Score: 4  Pain Location: R knee; c/o N/T in toes Pain Intervention(s): Limited activity within patient's tolerance;Monitored during session;Repositioned     PT Goals (current goals can now be found in the care plan section) Acute Rehab PT Goals Patient Stated Goal: to go home PT Goal Formulation: With patient Time For Goal Achievement: 11/03/20 Potential to Achieve Goals: Fair Progress towards PT goals: Progressing toward goals    Frequency    Min 2X/week      PT Plan Current plan remains appropriate       AM-PAC PT "6 Clicks" Mobility   Outcome Measure  Help needed turning from your back to your side while in a flat bed without using bedrails?: None Help needed moving from lying on your back to sitting on the side of a flat bed without using bedrails?: None Help needed moving to and from a bed to a chair (including a wheelchair)?: A Little Help needed standing up from a chair using your arms (e.g., wheelchair or bedside chair)?: A Little Help needed to walk in hospital room?: A Little Help needed climbing 3-5 steps with a railing? : A Lot 6 Click Score: 19    End of Session Equipment Utilized During Treatment: Gait belt Activity Tolerance: Patient tolerated treatment well;Treatment limited secondary to medical complications (Comment) Patient left: with call bell/phone within reach;in bed;with bed alarm set Nurse Communication: Mobility status (lightheadedness) PT Visit Diagnosis: Other abnormalities of gait and mobility (R26.89);Muscle weakness (generalized) (M62.81);History of falling (Z91.81)     Time: 7408-1448 PT Time Calculation (min) (ACUTE ONLY): 28 min  Charges:  $Therapeutic  Activity: 23-37 mins                      Vira Blanco, PT, DPT 10:48 AM,10/23/20

## 2020-10-23 NOTE — TOC Progression Note (Addendum)
Transition of Care [facility]) - Progression Note    Patient Details  Name: Jody Lowe MRN: [id number] Date of Birth: May 30, 1945  [facility] of [facility] CM/SW Contact  Margarito Liner, LCSW Phone Number: 10/23/2020, 11:55 AM  Clinical Narrative:  Insurance authorization approved: [id number]. Valid 8/1-8/3. Per MD, plan on discharge tomorrow.  3:26 pm: Updated son.  Expected Discharge Plan: Skilled Nursing Facility Barriers to Discharge: Continued Medical Work up  Expected Discharge Plan and Services Expected Discharge Plan: Skilled Nursing Facility     Post Acute Care Choice: Skilled Nursing Facility Living arrangements for the past 2 months: Apartment                 DME Arranged: Government social research officer                     Social Determinants of Health (SDOH) Interventions    Readmission Risk Interventions No flowsheet data found.

## 2020-10-23 NOTE — Care Management Obs Status (Signed)
MEDICARE OBSERVATION STATUS NOTIFICATION   Patient Details  Name: Jody Lowe MRN: [id number] Date of Birth: 06-04-1945   Medicare Observation Status Notification Given:  Yes    Margarito Liner, LCSW 10/23/2020, 3:26 PM

## 2020-10-24 ENCOUNTER — Ambulatory Visit (INDEPENDENT_AMBULATORY_CARE_PROVIDER_SITE_OTHER): Payer: Medicare Other | Admitting: Vascular Surgery

## 2020-10-24 ENCOUNTER — Encounter: Payer: Self-pay | Admitting: Vascular Surgery

## 2020-10-24 DIAGNOSIS — R531 Weakness: Secondary | ICD-10-CM | POA: Diagnosis not present

## 2020-10-24 DIAGNOSIS — I739 Peripheral vascular disease, unspecified: Secondary | ICD-10-CM | POA: Diagnosis not present

## 2020-10-24 DIAGNOSIS — D638 Anemia in other chronic diseases classified elsewhere: Secondary | ICD-10-CM | POA: Diagnosis not present

## 2020-10-24 LAB — RESP PANEL BY RT-PCR (FLU A&B, COVID) ARPGX2
Influenza A by PCR: NEGATIVE
Influenza B by PCR: NEGATIVE
SARS Coronavirus 2 by RT PCR: NEGATIVE

## 2020-10-24 LAB — BASIC METABOLIC PANEL
Anion gap: 10 (ref 5–15)
BUN: 11 mg/dL (ref 8–23)
CO2: 21 mmol/L — ABNORMAL LOW (ref 22–32)
Calcium: 8.8 mg/dL — ABNORMAL LOW (ref 8.9–10.3)
Chloride: 104 mmol/L (ref 98–111)
Creatinine, Ser: 1.04 mg/dL — ABNORMAL HIGH (ref 0.44–1.00)
GFR, Estimated: 56 mL/min — ABNORMAL LOW (ref >60)
Glucose, Bld: 70 mg/dL (ref 70–99)
Potassium: 4 mmol/L (ref 3.5–5.1)
Sodium: 135 mmol/L (ref 135–145)

## 2020-10-24 LAB — CBC
HCT: 28.4 % — ABNORMAL LOW (ref 36.0–46.0)
Hemoglobin: 9.3 g/dL — ABNORMAL LOW (ref 12.0–15.0)
MCH: 27 pg (ref 26.0–34.0)
MCHC: 32.7 g/dL (ref 30.0–36.0)
MCV: 82.3 fL (ref 80.0–100.0)
Platelets: 492 10*3/uL — ABNORMAL HIGH (ref 150–400)
RBC: 3.45 MIL/uL — ABNORMAL LOW (ref 3.87–5.11)
RDW: 17.6 % — ABNORMAL HIGH (ref 11.5–15.5)
WBC: 8.5 10*3/uL (ref 4.0–10.5)
nRBC: 0 % (ref 0.0–0.2)

## 2020-10-24 MED ORDER — OXYCODONE HCL 10 MG PO TABS: 10.0000 mg | ORAL_TABLET | Freq: Four times a day (QID) | ORAL | 0 refills | Status: AC | PRN

## 2020-10-24 NOTE — Progress Notes (Signed)
Physical Therapy Treatment Patient Details Name: Jody Lowe MRN: [id number] DOB: 05/20/1945 Today's Date: 10/24/2020    History of Present Illness presented to ER from home secondary to progressive weakness and inability to care for self in home environment; admitted for management/work up of weakness (possible deconditioning due to recent hospitalization per chart) and bilat LE pain. Pt s/p R SFA cath placement on 10/23/20.    PT Comments    Pt declining OOB mobility secondary to increased pain levels, but was agreeable for supine BLE therex. Pt able to complete x15 reps of BLE therex, requiring increased assist (min-mod assist) for heel slides, hip ABD/ADD, and SAQ. Continues to present with generalized LE weakness, with decreased mm activation noted during quad sets. Increased time/effort required during session secondary to increased pain with exercises, and cueing for attention to task. Pt will continue to benefit from skilled acute PT services to address deficits for return to baseline function. Will continue to recommend SNF at [place] with RW for energy conservation/safety with ADL's.     Follow Up Recommendations  SNF     Equipment Recommendations  Rolling walker with 5" wheels       Precautions / Restrictions Precautions Precautions: Fall Precaution Comments: delayed lightheadedness with upright mobility Restrictions Weight Bearing Restrictions: No    Mobility  Bed Mobility               General bed mobility comments: declined OOB mobility secondary to increased L knee and arm pain        Balance       Sitting balance - Comments: declined OOB mobility secondary to increased L knee and arm pain               Cognition Arousal/Alertness: Awake/alert Behavior During Therapy: WFL for tasks assessed/performed Overall Cognitive Status: Within Functional Limits for tasks assessed            General Comments: able to follow 100% of simple 2-step commands       Exercises Other Exercises Other Exercises: Pt declining OOB mobility secondary to L knee/LUE pain. Agreeable for supine BLE therex x15 including: ankle pumps, glute sets, quad sets, heel slides, isometric hip ADD, hip ABD/ADD, and SAQ. Increased assist required for heel slides, hip ABD/ADD, and SAQ secondary to pain. Minimal quad activation noted with quad sets. Other Exercises: Pt educated regarding: PT role/POC, DC recommendations, benefits of BLE therex, pain management/ breathing techniques. Pt verbalized understanding.        Pertinent Vitals/Pain Pain Score: 4  Pain Location: 3-4/10 L knee, 7-8/10 LUE pain secondary to infiltrated IV Pain Descriptors / Indicators: Sore;Aching Pain Intervention(s): Limited activity within patient's tolerance;Monitored during session;Repositioned     PT Goals (current goals can now be found in the care plan section) Acute Rehab PT Goals Patient Stated Goal: to go home PT Goal Formulation: With patient Time For Goal Achievement: 11/03/20 Potential to Achieve Goals: Fair Progress towards PT goals: Progressing toward goals    Frequency    Min 2X/week      PT Plan Current plan remains appropriate       AM-PAC PT "6 Clicks" Mobility   Outcome Measure  Help needed turning from your back to your side while in a flat bed without using bedrails?: None Help needed moving from lying on your back to sitting on the side of a flat bed without using bedrails?: None Help needed moving to and from a bed to a chair (including a wheelchair)?: A  Little Help needed standing up from a chair using your arms (e.g., wheelchair or bedside chair)?: A Little Help needed to walk in hospital room?: A Little Help needed climbing 3-5 steps with a railing? : A Lot 6 Click Score: 19    End of Session   Activity Tolerance: Patient tolerated treatment well;Patient limited by pain Patient left: with call bell/phone within reach;in bed;with bed alarm set Nurse  Communication: Mobility status PT Visit Diagnosis: Other abnormalities of gait and mobility (R26.89);Muscle weakness (generalized) (M62.81);History of falling (Z91.81)     Time: 6811-5726 PT Time Calculation (min) (ACUTE ONLY): 35 min  Charges:  $Therapeutic Exercise: 23-37 mins                      Vira Blanco, PT, DPT 10:16 AM,10/24/20

## 2020-10-24 NOTE — Discharge Summary (Signed)
Physician Discharge Summary  Jody Lowe UJW:[id number] DOB: 1945-06-06 DOA: 10/19/2020  PCP: Jody Salm, MD  Admit date: 10/19/2020 Discharge date: 10/24/2020  Admitted From: home  Disposition:  SNF  Recommendations for Outpatient Follow-up:  Follow up with PCP in 1-2 weeks F/u w/ vascular surg, Dr. Wyn Quaker, in 2-3 weeks  Home Health: no Equipment/Devices:  Discharge Condition: stable  CODE STATUS: full  Diet recommendation: Heart Healthy  Brief/Interim Summary: HPI was taken from Dr. Joylene Lowe: Jody Lowe is a 75 y.o. female with medical history significant for peripheral vascular disease status post recent angiogram of her left lower extremity with stent angioplasty, history of anemia of chronic disease, hypertension and nicotine dependence who was brought to the emergency room EMS for evaluation of pain in both lower extremities.  Patient rates her pain by 10 in intensity at its worst and denies any trauma recent falls.  She was recently discharged from the hospital 2 days prior to this admission and states that she fell in the lobby while waiting for her ride and has had difficulty ambulating at home.  She is unsteady on her feet and is concerned about sustaining a fall.  She lives alone and is unable to carry out her activities of daily living. She denies having any chest pain, no shortness of breath, no lower extremity swelling, no nausea, no vomiting, no abdominal pain, no dizziness, no lightheadedness, no headache, no urinary frequency, no dysuria, no nocturia, no changes in her bowel habits, no headache, no focal deficits, no palpitations. Labs show sodium 135, potassium 3.5, chloride 103, bicarb 25, glucose 100, BUN 10, creatinine 1.04,calcium 8.7, white count 12.8, hemoglobin 8.7, hematocrit 26, MCV 82, RDW 16.7, platelet count 424 Respiratory viral panel is pending 12-lead EKG reviewed by me shows normal sinus rhythm     ED Course: Patient is a 75 year old female  with a past medical history significant for obesity, peripheral vascular disease status post recent stent angioplasty to the left lower extremity, hypertension and anemia of chronic disease who presents to the ER via EMS for evaluation of weakness and difficulty with ambulation. She will be referred to observation status for further evaluation.   Hospital course from Dr. Mayford Lowe 7/30-10/24/20: Pt presented w/ generalized weakness likely secondary to physical deconditioning. PT/OT evaluated the pt and recommended SNF. Of note, pt had a aortogram, RLE angiogram, attempted recanalization of a chronically occluded right SFA w/ a previously placed stent fracture (unable to cross the lesion) as per vascular surg. Pt was scheduled for this procedure on 10/24/20 but it was done a day early as the pt was already in the hospital. Vascular surg cleared the pt for d/c to SNF from their perspective. For more information, please see previous progress/consult notes.   Discharge Diagnoses:  Principal Problem:   Generalized weakness Active Problems:   Tobacco use disorder   Peripheral vascular disease (HCC)   Hypertension, benign   Depressive disorder   Anemia Generalized weakness: likely secondary to physical deconditioning following recent hospitalization. High fall risk. Difficulty taking care of herself at home. PT recs SNF    Dizziness/lightheadedness: started on scheduled meclizine    PVD: s/p recent stent angioplasty to the LLE. Continue on effient, aspirin & statin   Depression: severity unknown. Continue on home dose of fluoxetine    Obesity: BMI 35.1. Complicates overall care & prognosis    Nicotine dependence: nicotine patch to prevent w/drawal. Received smoking cessation counseling   ACD: H&H are trending up today. No  indication for transfusion currently.  Pt denies any black or bloody stools, hematuria, hemoptysis or hematemesis. Pt is on effient and aspirin.   Hypokalemia: WNL today     Discharge Instructions  Discharge Instructions     Diet - low sodium heart healthy   Complete by: As directed    Discharge instructions   Complete by: As directed    F/u w/ PCP in 1-2 weeks. F/u w/ vascular surg, Dr. Wyn Lowe, in 2-3 weeks.   Increase activity slowly   Complete by: As directed       Allergies as of 10/24/2020       Reactions   Gabapentin Other (See Comments)   Nervous, shaky   Plavix [clopidogrel Bisulfate] Rash        Medication List     TAKE these medications    albuterol 108 (90 Base) MCG/ACT inhaler Commonly known as: VENTOLIN HFA Inhale 1-2 puffs into the lungs every 6 (six) hours as needed for wheezing or shortness of breath.   alendronate 70 MG tablet Commonly known as: FOSAMAX Take 70 mg by mouth once a week.   amitriptyline 25 MG tablet Commonly known as: ELAVIL Take 25 mg by mouth at bedtime.   aspirin EC 81 MG tablet Take 81 mg by mouth daily.   atorvastatin 20 MG tablet Commonly known as: LIPITOR Take 1 tablet (20 mg total) by mouth at bedtime.   D3-1000 25 MCG (1000 UT) capsule Generic drug: Cholecalciferol Take 1,000 Units by mouth daily.   diclofenac Sodium 1 % Gel Commonly known as: VOLTAREN Apply 2 g topically 4 (four) times daily.   esomeprazole 40 MG capsule Commonly known as: NEXIUM Take 40 mg by mouth daily.   ferrous sulfate 325 (65 FE) MG tablet Take 325 mg by mouth every other day.   FLUoxetine 40 MG capsule Commonly known as: PROZAC Take 2 capsules (80 mg total) by mouth daily. Take one capsule (40 mg) by mouth once daily for one month, then increase to two capsules (80 mg) thereafter   fluticasone 50 MCG/ACT nasal spray Commonly known as: FLONASE Place 1 spray into both nostrils daily.   ipratropium 0.03 % nasal spray Commonly known as: ATROVENT Place 2 sprays into both nostrils 3 (three) times daily.   meclizine 25 MG tablet Commonly known as: ANTIVERT Take 1 tablet (25 mg total) by mouth 3 (three)  times daily as needed for dizziness or nausea.   nicotine 21 mg/24hr patch Commonly known as: NICODERM CQ - dosed in mg/24 hours One 21 mg patch chest wall daily (okay to substitute generic)   nystatin powder Commonly known as: MYCOSTATIN/NYSTOP Apply 1 application topically 2 (two) times daily.   Oxycodone HCl 10 MG Tabs Take 10 mg by mouth every 6 (six) hours as needed (pain).   polyethylene glycol 17 g packet Commonly known as: MIRALAX / GLYCOLAX Take 17 g by mouth daily as needed for mild constipation.   prasugrel 10 MG Tabs tablet Commonly known as: EFFIENT Take 1 tablet (10 mg total) by mouth daily.   senna-docusate 8.6-50 MG tablet Commonly known as: Senokot-S Take 2 tablets by mouth daily.   vitamin B-12 1000 MCG tablet Commonly known as: CYANOCOBALAMIN Take 1 tablet (1,000 mcg total) by mouth daily.        Contact information for after-discharge care     Destination     [facility] Preferred [facility] .   Service: Skilled Paramedicursing Contact information: [address] [place] [id number]  [phone]                    Allergies  Allergen Reactions   Gabapentin Other (See Comments)    Nervous, shaky   Plavix [Clopidogrel Bisulfate] Rash    Consultations:    Procedures/Studies: DG Knee 1-2 Views Left  Result Date: 10/15/2020 CLINICAL DATA:  Chronic knee pain. EXAM: LEFT KNEE - 1-2 VIEW COMPARISON:  None. FINDINGS: Marked loss of joint space noted medial compartment with mild joint space loss laterally. Hypertrophic spurring is noted in all 3 compartments. Probable tiny joint effusion. No worrisome lytic or sclerotic osseous abnormality. IMPRESSION: Tricompartmental degenerative changes without acute bony findings. Electronically Signed   By: Kennith Center M.D.   On: 10/15/2020 08:52   DG Knee 1-2 Views Right  Result Date: 10/15/2020 CLINICAL DATA:  Chronic knee pain. EXAM: RIGHT KNEE - 1-2 VIEW COMPARISON:  None.  FINDINGS: Two-view exam shows no evidence for an acute fracture. Loss of joint space noted in both the medial and lateral compartments, medial compartment more affected. Hypertrophic spurring in all 3 compartments is moderate. Possible small joint effusion. Vascular stent device noted lower thigh. IMPRESSION: 1. Tricompartmental degenerative changes without acute bony abnormality. 2. Possible small joint effusion. Electronically Signed   By: Kennith Center M.D.   On: 10/15/2020 08:51   CT Head Wo Contrast  Result Date: 10/14/2020 CLINICAL DATA:  Fall with head and neck pain. EXAM: CT HEAD WITHOUT CONTRAST CT CERVICAL SPINE WITHOUT CONTRAST TECHNIQUE: Multidetector CT imaging of the head and cervical spine was performed following the standard protocol without intravenous contrast. Multiplanar CT image reconstructions of the cervical spine were also generated. COMPARISON:  CT head dated 09/20/2019. FINDINGS: CT HEAD FINDINGS Brain: No evidence of acute infarction, hemorrhage, hydrocephalus, extra-axial collection or mass lesion/mass effect. Periventricular white matter hypoattenuation likely represents chronic small vessel ischemic disease. Vascular: There are vascular calcifications in the carotid siphons. Skull: Normal. Negative for fracture or focal lesion. Sinuses/Orbits: No acute finding. Other: There is soft tissue swelling overlying the right forehead and frontal bone. CT CERVICAL SPINE FINDINGS Alignment: Normal. Skull base and vertebrae: No acute fracture. No primary bone lesion or focal pathologic process. Soft tissues and spinal canal: No prevertebral fluid or swelling. No visible canal hematoma. Disc levels: Up to moderate to severe multilevel degenerative disc and joint disease. Upper chest: Negative. Other: None. IMPRESSION: 1. No acute intracranial process. 2. No acute osseous injury in the cervical spine. Electronically Signed   By: Romona Curls M.D.   On: 10/14/2020 19:24   CT Cervical Spine Wo  Contrast  Result Date: 10/14/2020 CLINICAL DATA:  Fall with head and neck pain. EXAM: CT HEAD WITHOUT CONTRAST CT CERVICAL SPINE WITHOUT CONTRAST TECHNIQUE: Multidetector CT imaging of the head and cervical spine was performed following the standard protocol without intravenous contrast. Multiplanar CT image reconstructions of the cervical spine were also generated. COMPARISON:  CT head dated 09/20/2019. FINDINGS: CT HEAD FINDINGS Brain: No evidence of acute infarction, hemorrhage, hydrocephalus, extra-axial collection or mass lesion/mass effect. Periventricular white matter hypoattenuation likely represents chronic small vessel ischemic disease. Vascular: There are vascular calcifications in the carotid siphons. Skull: Normal. Negative for fracture or focal lesion. Sinuses/Orbits: No acute finding. Other: There is soft tissue swelling overlying the right forehead and frontal bone. CT CERVICAL SPINE FINDINGS Alignment: Normal. Skull base and vertebrae: No acute fracture. No primary bone lesion or focal pathologic process. Soft tissues and spinal canal: No prevertebral fluid or swelling. No  visible canal hematoma. Disc levels: Up to moderate to severe multilevel degenerative disc and joint disease. Upper chest: Negative. Other: None. IMPRESSION: 1. No acute intracranial process. 2. No acute osseous injury in the cervical spine. Electronically Signed   By: Romona Curls M.D.   On: 10/14/2020 19:24   MR BRAIN WO CONTRAST  Result Date: 10/15/2020 CLINICAL DATA:  Recurrent syncope EXAM: MRI HEAD WITHOUT CONTRAST TECHNIQUE: Multiplanar, multiecho pulse sequences of the brain and surrounding structures were obtained without intravenous contrast. COMPARISON:  05/01/2019 FINDINGS: Brain: No acute infarct, mass effect or extra-axial collection. No acute or chronic hemorrhage. There is multifocal hyperintense T2-weighted signal within the white matter. Parenchymal volume and CSF spaces are normal. The midline structures  are normal. Vascular: Major flow voids are preserved. Skull and upper cervical spine: Right frontal scalp hematoma Sinuses/Orbits:No paranasal sinus fluid levels or advanced mucosal thickening. No mastoid or middle ear effusion. Normal orbits. IMPRESSION: 1. No acute intracranial abnormality. 2. Findings of mild chronic small vessel disease. 3. Right frontal scalp hematoma. Electronically Signed   By: Deatra Robinson M.D.   On: 10/15/2020 00:01   US Carotid Bilateral  Result Date: 10/14/2020 CLINICAL DATA:  Syncope EXAM: BILATERAL CAROTID DUPLEX ULTRASOUND TECHNIQUE: Wallace Cullens scale imaging, color Doppler and duplex ultrasound were performed of bilateral carotid and vertebral arteries in the neck. COMPARISON:  05/01/2019 FINDINGS: Criteria: Quantification of carotid stenosis is based on velocity parameters that correlate the residual internal carotid diameter with NASCET-based stenosis levels, using the diameter of the distal internal carotid lumen as the denominator for stenosis measurement. The following velocity measurements were obtained: RIGHT ICA: 158 cm/sec CCA: 93 cm/sec SYSTOLIC ICA/CCA RATIO:  1.5 ECA: 52 cm/sec LEFT ICA: 195 cm/sec CCA: 82 cm/sec SYSTOLIC ICA/CCA RATIO:  2.3 ECA: 190 cm/sec RIGHT CAROTID ARTERY: Mild atherosclerosis at the carotid bifurcation. Normal waveform. RIGHT VERTEBRAL ARTERY:  Antegrade flow with normal waveform LEFT CAROTID ARTERY: Moderate atherosclerosis, predominantly affecting the ICA origin, but also narrowing the proximal ECA. LEFT VERTEBRAL ARTERY:  Antegrade flow with normal waveform IMPRESSION: 1. 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 2. Antegrade flow in the vertebral arteries. Electronically Signed   By: Deatra Robinson M.D.   On: 10/14/2020 23:00   PERIPHERAL VASCULAR CATHETERIZATION  Result Date: 10/23/2020 See surgical note for result.  PERIPHERAL VASCULAR CATHETERIZATION  Result Date: 10/16/2020 See  surgical note for result.  ECHOCARDIOGRAM COMPLETE  Result Date: 10/15/2020    ECHOCARDIOGRAM REPORT   Patient Name:   Jody Lowe Date of Exam: 10/15/2020 Medical Rec #:  [id number]      Height:       65.0 in Accession #:    [id number]     Weight:       187.4 lb Date of Birth:  Jul 24, 1945     BSA:          1.924 m Patient Age:    74 years       BP:           121/49 mmHg Patient Gender: F              HR:           76 bpm. Exam Location:  [facility] Procedure: 2D Echo Indications:     Syncope  History:         Patient has prior history of Echocardiogram examinations. TIA;  Risk Factors:Hypertension.  Sonographer:     L Thornton-Maynard Referring Phys:  [id number] G PATEL Diagnosing Phys: Adrian Blackwater MD IMPRESSIONS  1. Left ventricular ejection fraction, by estimation, is 60 to 65%. The left ventricle has normal function. The left ventricle has no regional wall motion abnormalities. Left ventricular diastolic parameters are consistent with Grade I diastolic dysfunction (impaired relaxation).  2. Right ventricular systolic function is normal. The right ventricular size is normal. There is normal pulmonary artery systolic pressure.  3. The mitral valve is normal in structure. Mild mitral valve regurgitation. No evidence of mitral stenosis.  4. The aortic valve is normal in structure. Aortic valve regurgitation is not visualized. No aortic stenosis is present.  5. The inferior vena cava is normal in size with greater than 50% respiratory variability, suggesting right atrial pressure of 3 mmHg. FINDINGS  Left Ventricle: Left ventricular ejection fraction, by estimation, is 60 to 65%. The left ventricle has normal function. The left ventricle has no regional wall motion abnormalities. The left ventricular internal cavity size was normal in size. There is  no left ventricular hypertrophy. Left ventricular diastolic parameters are consistent with Grade I diastolic dysfunction (impaired relaxation). Right  Ventricle: The right ventricular size is normal. No increase in right ventricular wall thickness. Right ventricular systolic function is normal. There is normal pulmonary artery systolic pressure. The tricuspid regurgitant velocity is 2.69 m/s, and  with an assumed right atrial pressure of 3 mmHg, the estimated right ventricular systolic pressure is 31.9 mmHg. Left Atrium: Left atrial size was normal in size. Right Atrium: Right atrial size was normal in size. Pericardium: There is no evidence of pericardial effusion. Mitral Valve: The mitral valve is normal in structure. Mild mitral valve regurgitation. No evidence of mitral valve stenosis. Tricuspid Valve: The tricuspid valve is normal in structure. Tricuspid valve regurgitation is mild . No evidence of tricuspid stenosis. Aortic Valve: The aortic valve is normal in structure. Aortic valve regurgitation is not visualized. No aortic stenosis is present. Aortic valve mean gradient measures 4.0 mmHg. Aortic valve peak gradient measures 7.1 mmHg. Aortic valve area, by VTI measures 2.54 cm. Pulmonic Valve: The pulmonic valve was normal in structure. Pulmonic valve regurgitation is not visualized. No evidence of pulmonic stenosis. Aorta: The aortic root is normal in size and structure. Venous: The inferior vena cava is normal in size with greater than 50% respiratory variability, suggesting right atrial pressure of 3 mmHg. IAS/Shunts: No atrial level shunt detected by color flow Doppler.  LEFT VENTRICLE PLAX 2D LVIDd:         4.68 cm  Diastology LVIDs:         2.97 cm  LV e' medial:    8.49 cm/s LV PW:         1.01 cm  LV E/e' medial:  11.2 LV IVS:        1.08 cm  LV e' lateral:   8.49 cm/s LVOT diam:     2.10 cm  LV E/e' lateral: 11.2 LV SV:         81 LV SV Index:   42 LVOT Area:     3.46 cm  RIGHT VENTRICLE RV S prime:     11.70 cm/s TAPSE (M-mode): 2.8 cm LEFT ATRIUM             Index       RIGHT ATRIUM           Index LA diam:  3.10 cm 1.61 cm/m  RA Area:      14.30 cm LA Vol (A2C):   62.8 ml 32.64 ml/m RA Volume:   33.40 ml  17.36 ml/m LA Vol (A4C):   63.4 ml 32.95 ml/m LA Biplane Vol: 63.0 ml 32.74 ml/m  AORTIC VALVE                   PULMONIC VALVE AV Area (Vmax):    2.33 cm    PV Vmax:       0.96 m/s AV Area (Vmean):   2.49 cm    PV Peak grad:  3.7 mmHg AV Area (VTI):     2.54 cm AV Vmax:           133.00 cm/s AV Vmean:          94.000 cm/s AV VTI:            0.321 m AV Peak Grad:      7.1 mmHg AV Mean Grad:      4.0 mmHg LVOT Vmax:         89.40 cm/s LVOT Vmean:        67.700 cm/s LVOT VTI:          0.235 m LVOT/AV VTI ratio: 0.73  AORTA Ao Root diam: 2.80 cm MITRAL VALVE               TRICUSPID VALVE MV Area (PHT): 3.73 cm    TR Peak grad:   28.9 mmHg MV E velocity: 94.70 cm/s  TR Vmax:        269.00 cm/s MV A velocity: 96.00 cm/s MV E/A ratio:  0.99        SHUNTS                            Systemic VTI:  0.24 m                            Systemic Diam: 2.10 cm Adrian Blackwater MD Electronically signed by Adrian Blackwater MD Signature Date/Time: 10/15/2020/9:46:41 AM    Final    (Echo, Carotid, EGD, Colonoscopy, ERCP)    Subjective:   Discharge Exam: Vitals:   10/24/20 0501 10/24/20 0732  BP: (!) 152/62 (!) 129/97  Pulse: 86 96  Resp: 20 16  Temp: 99.1 F (37.3 C) 99.2 F (37.3 C)  SpO2: 100% 99%   Vitals:   10/23/20 1815 10/23/20 1949 10/24/20 0501 10/24/20 0732  BP: (!) 155/63 129/76 (!) 152/62 (!) 129/97  Pulse: 77 82 86 96  Resp: Temp:  97.9 F (36.6 C) 99.1 F (37.3 C) 99.2 F (37.3 C)  TempSrc:   Oral   SpO2: 97% 100% 100% 99%  Weight:      Height:        General: Pt is alert, awake, not in acute distress Cardiovascular: S1/S2 +, no rubs, no gallops Respiratory: CTA bilaterally, no wheezing, no rhonchi Abdominal: Soft, NT, obese, bowel sounds + Extremities: no cyanosis    The results of significant diagnostics from this hospitalization (including imaging, microbiology, ancillary and laboratory) are  listed below for reference.     Microbiology: Recent Results (from the past 240 hour(s))  Resp Panel by RT-PCR (Flu A&B, Covid) Nasopharyngeal Swab     Status: None   Collection Time: 10/14/20  5:45 PM   Specimen: Nasopharyngeal Swab; Nasopharyngeal(NP) swabs  in vial transport medium  Result Value Ref Range Status   SARS Coronavirus 2 by RT PCR NEGATIVE NEGATIVE Final    Comment: (NOTE) SARS-CoV-2 target nucleic acids are NOT DETECTED.  The SARS-CoV-2 RNA is generally detectable in upper respiratory specimens during the acute phase of infection. The lowest concentration of SARS-CoV-2 viral copies this assay can detect is 138 copies/mL. A negative result does not preclude SARS-Cov-2 infection and should not be used as the sole basis for treatment or other patient management decisions. A negative result may occur with  improper specimen collection/handling, submission of specimen other than nasopharyngeal swab, presence of viral mutation(s) within the areas targeted by this assay, and inadequate number of viral copies(<138 copies/mL). A negative result must be combined with clinical observations, patient history, and epidemiological information. The expected result is Negative.  Fact Sheet for Patients:  BloggerCourse.com  Fact Sheet for Healthcare Providers:  SeriousBroker.it  This test is no t yet approved or cleared by the [place] FDA and  has been authorized for detection and/or diagnosis of SARS-CoV-2 by FDA under an Emergency Use Authorization (EUA). This EUA will remain  in effect (meaning this test can be used) for the duration of the COVID-19 declaration under Section 564(b)(1) of the Act, 21 U.S.C.section 360bbb-3(b)(1), unless the authorization is terminated  or revoked sooner.       Influenza A by PCR NEGATIVE NEGATIVE Final   Influenza B by PCR NEGATIVE NEGATIVE Final    Comment: (NOTE) The Xpert Xpress  SARS-CoV-2/FLU/RSV plus assay is intended as an aid in the diagnosis of influenza from Nasopharyngeal swab specimens and should not be used as a sole basis for treatment. Nasal washings and aspirates are unacceptable for Xpert Xpress SARS-CoV-2/FLU/RSV testing.  Fact Sheet for Patients: BloggerCourse.com  Fact Sheet for Healthcare Providers: SeriousBroker.it  This test is not yet approved or cleared by the [place] FDA and has been authorized for detection and/or diagnosis of SARS-CoV-2 by FDA under an Emergency Use Authorization (EUA). This EUA will remain in effect (meaning this test can be used) for the duration of the COVID-19 declaration under Section 564(b)(1) of the Act, 21 U.S.C. section 360bbb-3(b)(1), unless the authorization is terminated or revoked.  Performed at [facility], [address] [place], [place] [address]   Blood culture (routine x 2)     Status: None   Collection Time: 10/14/20  7:59 PM   Specimen: BLOOD  Result Value Ref Range Status   Specimen Description BLOOD RIGHT ASSIST CONTROL  Final   Special Requests   Final    BOTTLES DRAWN AEROBIC AND ANAEROBIC Blood Culture results may not be optimal due to an inadequate volume of blood received in culture bottles   Culture   Final    NO GROWTH 5 DAYS Performed at [facility], [address] [place], [place] [address]    Report Status 10/19/2020 FINAL  Final  Blood culture (routine x 2)     Status: None   Collection Time: 10/14/20  8:00 PM   Specimen: BLOOD  Result Value Ref Range Status   Specimen Description BLOOD LEFT ASSIST CONTROL  Final   Special Requests   Final    BOTTLES DRAWN AEROBIC AND ANAEROBIC Blood Culture results may not be optimal due to an inadequate volume of blood received in culture bottles   Culture   Final    NO GROWTH 5 DAYS Performed at [facility], [address] [place], [place]  [address]  Report Status 10/19/2020 FINAL  Final  Resp Panel by RT-PCR (Flu A&B, Covid) Nasopharyngeal Swab     Status: None   Collection Time: 10/16/20 12:21 PM   Specimen: Nasopharyngeal Swab; Nasopharyngeal(NP) swabs in vial transport medium  Result Value Ref Range Status   SARS Coronavirus 2 by RT PCR NEGATIVE NEGATIVE Final    Comment: (NOTE) SARS-CoV-2 target nucleic acids are NOT DETECTED.  The SARS-CoV-2 RNA is generally detectable in upper respiratory specimens during the acute phase of infection. The lowest concentration of SARS-CoV-2 viral copies this assay can detect is 138 copies/mL. A negative result does not preclude SARS-Cov-2 infection and should not be used as the sole basis for treatment or other patient management decisions. A negative result may occur with  improper specimen collection/handling, submission of specimen other than nasopharyngeal swab, presence of viral mutation(s) within the areas targeted by this assay, and inadequate number of viral copies(<138 copies/mL). A negative result must be combined with clinical observations, patient history, and epidemiological information. The expected result is Negative.  Fact Sheet for Patients:  BloggerCourse.com  Fact Sheet for Healthcare Providers:  SeriousBroker.it  This test is no t yet approved or cleared by the [place] FDA and  has been authorized for detection and/or diagnosis of SARS-CoV-2 by FDA under an Emergency Use Authorization (EUA). This EUA will remain  in effect (meaning this test can be used) for the duration of the COVID-19 declaration under Section 564(b)(1) of the Act, 21 U.S.C.section 360bbb-3(b)(1), unless the authorization is terminated  or revoked sooner.       Influenza A by PCR NEGATIVE NEGATIVE Final   Influenza B by PCR NEGATIVE NEGATIVE Final    Comment: (NOTE) The Xpert Xpress SARS-CoV-2/FLU/RSV plus assay is intended as an  aid in the diagnosis of influenza from Nasopharyngeal swab specimens and should not be used as a sole basis for treatment. Nasal washings and aspirates are unacceptable for Xpert Xpress SARS-CoV-2/FLU/RSV testing.  Fact Sheet for Patients: BloggerCourse.com  Fact Sheet for Healthcare Providers: SeriousBroker.it  This test is not yet approved or cleared by the [place] FDA and has been authorized for detection and/or diagnosis of SARS-CoV-2 by FDA under an Emergency Use Authorization (EUA). This EUA will remain in effect (meaning this test can be used) for the duration of the COVID-19 declaration under Section 564(b)(1) of the Act, 21 U.S.C. section 360bbb-3(b)(1), unless the authorization is terminated or revoked.  Performed at [facility], [address] [place], [place] [address]   Resp Panel by RT-PCR (Flu A&B, Covid) Nasopharyngeal Swab     Status: None   Collection Time: 10/20/20 12:16 AM   Specimen: Nasopharyngeal Swab; Nasopharyngeal(NP) swabs in vial transport medium  Result Value Ref Range Status   SARS Coronavirus 2 by RT PCR NEGATIVE NEGATIVE Final    Comment: (NOTE) SARS-CoV-2 target nucleic acids are NOT DETECTED.  The SARS-CoV-2 RNA is generally detectable in upper respiratory specimens during the acute phase of infection. The lowest concentration of SARS-CoV-2 viral copies this assay can detect is 138 copies/mL. A negative result does not preclude SARS-Cov-2 infection and should not be used as the sole basis for treatment or other patient management decisions. A negative result may occur with  improper specimen collection/handling, submission of specimen other than nasopharyngeal swab, presence of viral mutation(s) within the areas targeted by this assay, and inadequate number of viral copies(<138 copies/mL). A negative result must be combined with clinical observations, patient history, and  epidemiological information. The expected  result is Negative.  Fact Sheet for Patients:  BloggerCourse.com  Fact Sheet for Healthcare Providers:  SeriousBroker.it  This test is no t yet approved or cleared by the [place] FDA and  has been authorized for detection and/or diagnosis of SARS-CoV-2 by FDA under an Emergency Use Authorization (EUA). This EUA will remain  in effect (meaning this test can be used) for the duration of the COVID-19 declaration under Section 564(b)(1) of the Act, 21 U.S.C.section 360bbb-3(b)(1), unless the authorization is terminated  or revoked sooner.       Influenza A by PCR NEGATIVE NEGATIVE Final   Influenza B by PCR NEGATIVE NEGATIVE Final    Comment: (NOTE) The Xpert Xpress SARS-CoV-2/FLU/RSV plus assay is intended as an aid in the diagnosis of influenza from Nasopharyngeal swab specimens and should not be used as a sole basis for treatment. Nasal washings and aspirates are unacceptable for Xpert Xpress SARS-CoV-2/FLU/RSV testing.  Fact Sheet for Patients: BloggerCourse.com  Fact Sheet for Healthcare Providers: SeriousBroker.it  This test is not yet approved or cleared by the [place] FDA and has been authorized for detection and/or diagnosis of SARS-CoV-2 by FDA under an Emergency Use Authorization (EUA). This EUA will remain in effect (meaning this test can be used) for the duration of the COVID-19 declaration under Section 564(b)(1) of the Act, 21 U.S.C. section 360bbb-3(b)(1), unless the authorization is terminated or revoked.  Performed at [facility], [address] [place], [place] [address]      Labs: BNP (last 3 results) No results for input(s): BNP in the last 8760 hours. Basic Metabolic Panel: Recent Labs  Lab 10/20/20 0654 10/21/20 0425 10/22/20 0438 10/23/20 0427 10/24/20 0526  NA 134* 133* 133*  133* 135  K 3.4* 3.6 3.8 3.6 4.0  CL 102 104 104 102 104  CO2 21*  GLUCOSE 85 88 99 90 70  BUN CREATININE 0.86 0.95 0.96 1.01* 1.04*  CALCIUM 8.5* 8.4* 8.5* 8.5* 8.8*   Liver Function Tests: No results for input(s): AST, ALT, ALKPHOS, BILITOT, PROT, ALBUMIN in the last 168 hours. No results for input(s): LIPASE, AMYLASE in the last 168 hours. No results for input(s): AMMONIA in the last 168 hours. CBC: Recent Labs  Lab 10/19/20 2313 10/20/20 0654 10/21/20 0425 10/22/20 0438 10/23/20 0427 10/24/20 0526  WBC 12.8* 11.7* 8.9 8.3 7.6 8.5  NEUTROABS 9.4*  --   --   --   --   --   HGB 8.7* 7.8* 7.5* 7.6* 8.1* 9.3*  HCT 26.0* 23.4* 23.3* 22.8* 24.5* 28.4*  MCV 82.8 81.8 81.2 81.7 81.1 82.3  PLT 424* 380 379 391 425* 492*   Cardiac Enzymes: No results for input(s): CKTOTAL, CKMB, CKMBINDEX, TROPONINI in the last 168 hours. BNP: Invalid input(s): POCBNP CBG: Recent Labs  Lab 10/23/20 0740  GLUCAP 90   D-Dimer No results for input(s): DDIMER in the last 72 hours. Hgb A1c No results for input(s): HGBA1C in the last 72 hours. Lipid Profile No results for input(s): CHOL, HDL, LDLCALC, TRIG, CHOLHDL, LDLDIRECT in the last 72 hours. Thyroid function studies No results for input(s): TSH, T4TOTAL, T3FREE, THYROIDAB in the last 72 hours.  Invalid input(s): FREET3 Anemia work up No results for input(s): VITAMINB12, FOLATE, FERRITIN, TIBC, IRON, RETICCTPCT in the last 72 hours. Urinalysis    Component Value Date/Time   COLORURINE YELLOW (A) 10/14/2020 1745   APPEARANCEUR CLEAR (A) 10/14/2020 1745   LABSPEC 1.009 10/14/2020  1745   PHURINE 6.0 10/14/2020 1745   GLUCOSEU NEGATIVE 10/14/2020 1745   HGBUR NEGATIVE 10/14/2020 1745   BILIRUBINUR NEGATIVE 10/14/2020 1745   KETONESUR NEGATIVE 10/14/2020 1745   PROTEINUR NEGATIVE 10/14/2020 1745   NITRITE NEGATIVE 10/14/2020 1745   LEUKOCYTESUR NEGATIVE 10/14/2020 1745   Sepsis Labs Invalid input(s):  PROCALCITONIN,  WBC,  LACTICIDVEN Microbiology Recent Results (from the past 240 hour(s))  Resp Panel by RT-PCR (Flu A&B, Covid) Nasopharyngeal Swab     Status: None   Collection Time: 10/14/20  5:45 PM   Specimen: Nasopharyngeal Swab; Nasopharyngeal(NP) swabs in vial transport medium  Result Value Ref Range Status   SARS Coronavirus 2 by RT PCR NEGATIVE NEGATIVE Final    Comment: (NOTE) SARS-CoV-2 target nucleic acids are NOT DETECTED.  The SARS-CoV-2 RNA is generally detectable in upper respiratory specimens during the acute phase of infection. The lowest concentration of SARS-CoV-2 viral copies this assay can detect is 138 copies/mL. A negative result does not preclude SARS-Cov-2 infection and should not be used as the sole basis for treatment or other patient management decisions. A negative result may occur with  improper specimen collection/handling, submission of specimen other than nasopharyngeal swab, presence of viral mutation(s) within the areas targeted by this assay, and inadequate number of viral copies(<138 copies/mL). A negative result must be combined with clinical observations, patient history, and epidemiological information. The expected result is Negative.  Fact Sheet for Patients:  BloggerCourse.com  Fact Sheet for Healthcare Providers:  SeriousBroker.it  This test is no t yet approved or cleared by the [place] FDA and  has been authorized for detection and/or diagnosis of SARS-CoV-2 by FDA under an Emergency Use Authorization (EUA). This EUA will remain  in effect (meaning this test can be used) for the duration of the COVID-19 declaration under Section 564(b)(1) of the Act, 21 U.S.C.section 360bbb-3(b)(1), unless the authorization is terminated  or revoked sooner.       Influenza A by PCR NEGATIVE NEGATIVE Final   Influenza B by PCR NEGATIVE NEGATIVE Final    Comment: (NOTE) The Xpert Xpress  SARS-CoV-2/FLU/RSV plus assay is intended as an aid in the diagnosis of influenza from Nasopharyngeal swab specimens and should not be used as a sole basis for treatment. Nasal washings and aspirates are unacceptable for Xpert Xpress SARS-CoV-2/FLU/RSV testing.  Fact Sheet for Patients: BloggerCourse.com  Fact Sheet for Healthcare Providers: SeriousBroker.it  This test is not yet approved or cleared by the [place] FDA and has been authorized for detection and/or diagnosis of SARS-CoV-2 by FDA under an Emergency Use Authorization (EUA). This EUA will remain in effect (meaning this test can be used) for the duration of the COVID-19 declaration under Section 564(b)(1) of the Act, 21 U.S.C. section 360bbb-3(b)(1), unless the authorization is terminated or revoked.  Performed at [facility], [address] [place], [place] [address]   Blood culture (routine x 2)     Status: None   Collection Time: 10/14/20  7:59 PM   Specimen: BLOOD  Result Value Ref Range Status   Specimen Description BLOOD RIGHT ASSIST CONTROL  Final   Special Requests   Final    BOTTLES DRAWN AEROBIC AND ANAEROBIC Blood Culture results may not be optimal due to an inadequate volume of blood received in culture bottles   Culture   Final    NO GROWTH 5 DAYS Performed at [facility], [address] [place], [place] [address]    Report Status 10/19/2020 FINAL  Final  Blood culture (routine x 2)     Status: None   Collection Time: 10/14/20  8:00 PM   Specimen: BLOOD  Result Value Ref Range Status   Specimen Description BLOOD LEFT ASSIST CONTROL  Final   Special Requests   Final    BOTTLES DRAWN AEROBIC AND ANAEROBIC Blood Culture results may not be optimal due to an inadequate volume of blood received in culture bottles   Culture   Final    NO GROWTH 5 DAYS Performed at [facility], [address] [place], [place]  [address]    Report Status 10/19/2020 FINAL  Final  Resp Panel by RT-PCR (Flu A&B, Covid) Nasopharyngeal Swab     Status: None   Collection Time: 10/16/20 12:21 PM   Specimen: Nasopharyngeal Swab; Nasopharyngeal(NP) swabs in vial transport medium  Result Value Ref Range Status   SARS Coronavirus 2 by RT PCR NEGATIVE NEGATIVE Final    Comment: (NOTE) SARS-CoV-2 target nucleic acids are NOT DETECTED.  The SARS-CoV-2 RNA is generally detectable in upper respiratory specimens during the acute phase of infection. The lowest concentration of SARS-CoV-2 viral copies this assay can detect is 138 copies/mL. A negative result does not preclude SARS-Cov-2 infection and should not be used as the sole basis for treatment or other patient management decisions. A negative result may occur with  improper specimen collection/handling, submission of specimen other than nasopharyngeal swab, presence of viral mutation(s) within the areas targeted by this assay, and inadequate number of viral copies(<138 copies/mL). A negative result must be combined with clinical observations, patient history, and epidemiological information. The expected result is Negative.  Fact Sheet for Patients:  BloggerCourse.com  Fact Sheet for Healthcare Providers:  SeriousBroker.it  This test is no t yet approved or cleared by the [place] FDA and  has been authorized for detection and/or diagnosis of SARS-CoV-2 by FDA under an Emergency Use Authorization (EUA). This EUA will remain  in effect (meaning this test can be used) for the duration of the COVID-19 declaration under Section 564(b)(1) of the Act, 21 U.S.C.section 360bbb-3(b)(1), unless the authorization is terminated  or revoked sooner.       Influenza A by PCR NEGATIVE NEGATIVE Final   Influenza B by PCR NEGATIVE NEGATIVE Final    Comment: (NOTE) The Xpert Xpress SARS-CoV-2/FLU/RSV plus assay is intended as an  aid in the diagnosis of influenza from Nasopharyngeal swab specimens and should not be used as a sole basis for treatment. Nasal washings and aspirates are unacceptable for Xpert Xpress SARS-CoV-2/FLU/RSV testing.  Fact Sheet for Patients: BloggerCourse.com  Fact Sheet for Healthcare Providers: SeriousBroker.it  This test is not yet approved or cleared by the [place] FDA and has been authorized for detection and/or diagnosis of SARS-CoV-2 by FDA under an Emergency Use Authorization (EUA). This EUA will remain in effect (meaning this test can be used) for the duration of the COVID-19 declaration under Section 564(b)(1) of the Act, 21 U.S.C. section 360bbb-3(b)(1), unless the authorization is terminated or revoked.  Performed at [facility], [address] [place], [place] [address]   Resp Panel by RT-PCR (Flu A&B, Covid) Nasopharyngeal Swab     Status: None   Collection Time: 10/20/20 12:16 AM   Specimen: Nasopharyngeal Swab; Nasopharyngeal(NP) swabs in vial transport medium  Result Value Ref Range Status   SARS Coronavirus 2 by RT PCR NEGATIVE NEGATIVE Final    Comment: (NOTE) SARS-CoV-2 target nucleic acids are NOT DETECTED.  The SARS-CoV-2 RNA  is generally detectable in upper respiratory specimens during the acute phase of infection. The lowest concentration of SARS-CoV-2 viral copies this assay can detect is 138 copies/mL. A negative result does not preclude SARS-Cov-2 infection and should not be used as the sole basis for treatment or other patient management decisions. A negative result may occur with  improper specimen collection/handling, submission of specimen other than nasopharyngeal swab, presence of viral mutation(s) within the areas targeted by this assay, and inadequate number of viral copies(<138 copies/mL). A negative result must be combined with clinical observations, patient history, and  epidemiological information. The expected result is Negative.  Fact Sheet for Patients:  BloggerCourse.com  Fact Sheet for Healthcare Providers:  SeriousBroker.it  This test is no t yet approved or cleared by the [place] FDA and  has been authorized for detection and/or diagnosis of SARS-CoV-2 by FDA under an Emergency Use Authorization (EUA). This EUA will remain  in effect (meaning this test can be used) for the duration of the COVID-19 declaration under Section 564(b)(1) of the Act, 21 U.S.C.section 360bbb-3(b)(1), unless the authorization is terminated  or revoked sooner.       Influenza A by PCR NEGATIVE NEGATIVE Final   Influenza B by PCR NEGATIVE NEGATIVE Final    Comment: (NOTE) The Xpert Xpress SARS-CoV-2/FLU/RSV plus assay is intended as an aid in the diagnosis of influenza from Nasopharyngeal swab specimens and should not be used as a sole basis for treatment. Nasal washings and aspirates are unacceptable for Xpert Xpress SARS-CoV-2/FLU/RSV testing.  Fact Sheet for Patients: BloggerCourse.com  Fact Sheet for Healthcare Providers: SeriousBroker.it  This test is not yet approved or cleared by the [place] FDA and has been authorized for detection and/or diagnosis of SARS-CoV-2 by FDA under an Emergency Use Authorization (EUA). This EUA will remain in effect (meaning this test can be used) for the duration of the COVID-19 declaration under Section 564(b)(1) of the Act, 21 U.S.C. section 360bbb-3(b)(1), unless the authorization is terminated or revoked.  Performed at [facility], [address], [place], [place] [address]      Time coordinating discharge: Over 30 minutes  SIGNED:   Charise Killian, MD  [facility] 10/24/2020, 11:58 AM Pager   If 7PM-7AM, please contact night-coverage

## 2020-10-24 NOTE — Progress Notes (Signed)
Dear Doctor: This patient has been identified as a candidate for Central line placement for the following reason (s): drug pH or osmolality (causing phlebitis, infiltration in 24 hours), poor veins/poor circulatory system (CHF, COPD, emphysema, diabetes, steroid use, IV drug abuse, etc.), and restarts due to phlebitis and infiltration in 24 hours If you agree, please write an order for the indicated device.   Thank you for supporting the early vascular access assessment program.

## 2020-10-24 NOTE — TOC Transition Note (Signed)
Transition of Care [facility]) - CM/SW Discharge Note   Patient Details  Name: Jody Lowe MRN: [id number] Date of Birth: 1945-05-03  [facility] CM/SW Contact:  Margarito Liner, LCSW Phone Number: 10/24/2020, 2:34 PM   Clinical Narrative: Patient has orders to discharge to [facility] today. RN will call report to [phone] (Room 16B). EMS transport has been arranged. They said there are several in front of her. No further concerns. CSW signing off.    Final next level of care: Skilled Nursing Facility Barriers to Discharge: Barriers Resolved   Patient Goals and CMS Choice     Choice offered to / list presented to : Patient, Adult Children  Discharge Placement PASRR number recieved: 10/21/20            Patient chooses bed at: [facility] Patient to be transferred to facility by: EMS Name of family member notified: Donna Bernard Patient and family notified of of transfer: 10/24/20  Discharge Plan and Services     Post Acute Care Choice: Skilled Nursing Facility          DME Arranged: Wheelchair manual                    Social Determinants of Health (SDOH) Interventions     Readmission Risk Interventions No flowsheet data found.

## 2020-10-24 NOTE — Progress Notes (Signed)
Mobility Specialist - Progress Note   10/24/20 1500  Mobility  Activity Transferred to/from [facility];Transferred:  Bed to chair  Level of Assistance Moderate assist, patient does 50-74%  Assistive Device Front wheel walker  Distance Ambulated (ft) 6 ft  Mobility Out of bed to chair with meals  Mobility Response Tolerated well  Mobility performed by Mobility specialist  $Mobility charge 1 Mobility    Pt lying in bed upon arrival, utilizing RA. Pt c/o pain in BLE and LUE. Pt was able to get EOB with supervision and increased time, 2 short rest breaks taken during transfer. Pt denied lightheadedness/dizziness. Pt with great difficulty coming into standing from standard bed height this date - 3 attempts. ModA to stand from elevated bed height. Rocking motion to initiate transfer. Pt had incontinent episode upon standing, able to transfer to [facility] to continue urinary output. Gown and socks changed. Pt stood from [facility] with modA and extra time then voiced need to continue urinary output, returned to [facility]. Steps repeated. MinA and VC for proper peri-care. Pt transferred to [facility] for seated rest break. Multiple attempts and extra time needed for all standing transfers this date. Pt ambulated short distance to recliner where she was left with alarm set and lunch tray in reach.    Filiberto Pinks Mobility Specialist 10/24/20, 3:29 PM

## 2020-11-28 ENCOUNTER — Telehealth (INDEPENDENT_AMBULATORY_CARE_PROVIDER_SITE_OTHER): Payer: Self-pay

## 2020-11-28 NOTE — Telephone Encounter (Signed)
Daryll Brod from [facility] left a voicemail stating that the patient might be having some vascular issues with her leg. The patient has wound on the top of he right foot with some eschar and patient is having pain. Patient was seen in the hospital and needed to follow up in 2-3 weeks. The nurse is using calcium alginate with border foam dressing. I spoke with Dr Wyn Quaker and advise for the patient to come in the office. Patient will be reach out to be schedule

## 2020-12-01 ENCOUNTER — Ambulatory Visit (INDEPENDENT_AMBULATORY_CARE_PROVIDER_SITE_OTHER): Payer: Medicare Other | Admitting: Nurse Practitioner

## 2020-12-05 ENCOUNTER — Encounter (INDEPENDENT_AMBULATORY_CARE_PROVIDER_SITE_OTHER): Payer: Self-pay | Admitting: Nurse Practitioner

## 2020-12-05 ENCOUNTER — Ambulatory Visit (INDEPENDENT_AMBULATORY_CARE_PROVIDER_SITE_OTHER): Payer: Medicare Other | Admitting: Nurse Practitioner

## 2020-12-05 ENCOUNTER — Other Ambulatory Visit: Payer: Self-pay

## 2020-12-05 VITALS — BP 120/68 | HR 89 | Ht 66.0 in | Wt 200.0 lb

## 2020-12-05 DIAGNOSIS — E785 Hyperlipidemia, unspecified: Secondary | ICD-10-CM

## 2020-12-05 DIAGNOSIS — F17200 Nicotine dependence, unspecified, uncomplicated: Secondary | ICD-10-CM | POA: Diagnosis not present

## 2020-12-05 DIAGNOSIS — I7025 Atherosclerosis of native arteries of other extremities with ulceration: Secondary | ICD-10-CM | POA: Diagnosis not present

## 2020-12-05 MED ORDER — AMOXICILLIN-POT CLAVULANATE 875-125 MG PO TABS: 1.0000 | ORAL_TABLET | Freq: Two times a day (BID) | ORAL | 0 refills | Status: DC

## 2020-12-06 ENCOUNTER — Encounter: Payer: Self-pay | Admitting: Internal Medicine

## 2020-12-06 ENCOUNTER — Telehealth (INDEPENDENT_AMBULATORY_CARE_PROVIDER_SITE_OTHER): Payer: Self-pay

## 2020-12-06 ENCOUNTER — Ambulatory Visit (INDEPENDENT_AMBULATORY_CARE_PROVIDER_SITE_OTHER): Payer: Medicare Other | Admitting: Internal Medicine

## 2020-12-06 VITALS — BP 131/63 | HR 107 | Ht 66.0 in | Wt 206.0 lb

## 2020-12-06 DIAGNOSIS — R778 Other specified abnormalities of plasma proteins: Secondary | ICD-10-CM

## 2020-12-06 DIAGNOSIS — Z01810 Encounter for preprocedural cardiovascular examination: Secondary | ICD-10-CM | POA: Diagnosis not present

## 2020-12-06 DIAGNOSIS — I739 Peripheral vascular disease, unspecified: Secondary | ICD-10-CM

## 2020-12-06 DIAGNOSIS — R0602 Shortness of breath: Secondary | ICD-10-CM

## 2020-12-06 DIAGNOSIS — Z01818 Encounter for other preprocedural examination: Secondary | ICD-10-CM

## 2020-12-06 DIAGNOSIS — I70229 Atherosclerosis of native arteries of extremities with rest pain, unspecified extremity: Secondary | ICD-10-CM

## 2020-12-06 DIAGNOSIS — R0600 Dyspnea, unspecified: Secondary | ICD-10-CM

## 2020-12-06 DIAGNOSIS — R42 Dizziness and giddiness: Secondary | ICD-10-CM

## 2020-12-06 NOTE — Progress Notes (Signed)
New Outpatient Visit Date: 12/06/2020  Referring Provider: Georgiana Spinner, NP [address] [place],  [place] [address]  Chief Complaint: Swimmy headed  HPI:  Jody Lowe is a 75 y.o. female who is being seen today for preoperative cardiovascular risk assessment in the setting of critical limb ischemia at the request of Ms. Jody Lowe. She has a history of peripheral vascular disease, hypertension, anemia, and tobacco abuse.  She was hospitalized twice in July, initially for syncope in the setting of hyperthermia complicated by severe leg pain in the setting of PAD status post left lower extremity intervention.  She was rehospitalized less than a week later due to generalized weakness and severe bilateral leg pain.  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  She will likely need femoropopliteal bypass.  Today, Jody Lowe complains primarily of dizziness and continued pain in the right leg.  She reports feeling "swimmy headed" on a regular basis, which has led to several falls.  She denies passing out, though she sometimes feels like she needs to hold onto something to keep from falling.  Episodes occur randomly and usually last ~5 minutes.  She denies palpitations and chest pain.  She has intermittent shortness of breath that can occur anytime (including at rest).  She was told that she had a heart attack during her initial hospitalization in July, though she is unsure about this diagnosis (HS-TnI was mildly elevated; echo showed preserved LVEF without wall motion abnormality).  Jody Lowe reports having undergone myocardial perfusion stress testing and cardiac catheterization at [facility] in the past (she reports this was at least 5 years ago; no record of either study seen in CareEverywhere).  --------------------------------------------------------------------------------------------------  Cardiovascular History & Procedures: Cardiovascular Problems: PAD with  critical limb ischemia  Risk Factors: PAD, hypertension, tobacco use, obesity, and age greater than 28  Cath/PCI: None  CV Surgery: Unsuccessful right SFA chronic total occlusion revascularization (10/23/2020) Left SFA/popliteal PTCA and covered stent placement (10/16/2020).  EP Procedures and Devices: None  Non-Invasive Evaluation(s): TTE (10/15/2020): Normal LV size and wall thickness.  LVEF 60-65% with grade 1 diastolic dysfunction.  Normal RV size and function.  Normal biatrial size.  Mild mitral regurgitation.  Mild tricuspid regurgitation.  Normal PA pressure and CVP.  Recent CV Pertinent Labs: Lab Results  Component Value Date   CHOL 89 10/16/2020   HDL 40 (L) 10/16/2020   LDLCALC 41 10/16/2020   TRIG 42 10/16/2020   CHOLHDL 2.2 10/16/2020   INR 1.2 10/14/2020   K 4.0 10/24/2020   MG 1.6 (L) 05/01/2019   BUN 11 10/24/2020   CREATININE 1.04 (H) 10/24/2020    --------------------------------------------------------------------------------------------------  Past Medical History:  Diagnosis Date   Anemia    Hypertension    Peripheral artery disease (HCC)    Renal disorder     Past Surgical History:  Procedure Laterality Date   BREAST REDUCTION SURGERY     CARDIAC CATHETERIZATION     CATARACT EXTRACTION W/PHACO Left 11/11/2019   Procedure: CATARACT EXTRACTION PHACO AND INTRAOCULAR LENS PLACEMENT (IOC) LEFT VISION BLUE;  Surgeon: Elliot Cousin, MD;  Location: [facility];  Service: Ophthalmology;  Laterality: Left;  7.94 0:42.4   CATARACT EXTRACTION W/PHACO Right 12/23/2019   Procedure: CATARACT EXTRACTION PHACO AND INTRAOCULAR LENS PLACEMENT (IOC) RIGHT VISION BLUE 5.47  00:31.1;  Surgeon: Elliot Cousin, MD;  Location: [facility];  Service: Ophthalmology;  Laterality: Right;   LOWER EXTREMITY ANGIOGRAPHY N/A 10/16/2020   Procedure: Lower Extremity  Angiography;  Surgeon: Annice Needy, MD;  Location: [facility] INVASIVE CV LAB;  Service: Cardiovascular;   Laterality: N/A;   LOWER EXTREMITY ANGIOGRAPHY Right 10/23/2020   Procedure: LOWER EXTREMITY ANGIOGRAPHY;  Surgeon: Annice Needy, MD;  Location: [facility] INVASIVE CV LAB;  Service: Cardiovascular;  Laterality: Right;    Current Meds  Medication Sig   albuterol (VENTOLIN HFA) 108 (90 Base) MCG/ACT inhaler Inhale 1-2 puffs into the lungs every 6 (six) hours as needed for wheezing or shortness of breath.   alendronate (FOSAMAX) 70 MG tablet Take 70 mg by mouth once a week.   amitriptyline (ELAVIL) 25 MG tablet Take 25 mg by mouth at bedtime.   amoxicillin-clavulanate (AUGMENTIN) 875-125 MG tablet Take 1 tablet by mouth 2 (two) times daily.   aspirin EC 81 MG tablet Take 81 mg by mouth daily.   atorvastatin (LIPITOR) 20 MG tablet Take 1 tablet (20 mg total) by mouth at bedtime.   Cholecalciferol (D3-1000) 25 MCG (1000 UT) capsule Take 1,000 Units by mouth daily.   cyanocobalamin 1000 MCG tablet Take 1,000 mcg by mouth every other day.   diclofenac Sodium (VOLTAREN) 1 % GEL Apply topically 4 (four) times daily as needed.   esomeprazole (NEXIUM) 40 MG capsule Take 40 mg by mouth daily.   ferrous sulfate 325 (65 FE) MG tablet Take 325 mg by mouth every other day.   FLUoxetine (PROZAC) 40 MG capsule Take 2 capsules (80 mg total) by mouth daily. Take one capsule (40 mg) by mouth once daily for one month, then increase to two capsules (80 mg) thereafter   fluticasone (FLONASE) 50 MCG/ACT nasal spray Place 1 spray into both nostrils daily.   fluticasone (FLONASE) 50 MCG/ACT nasal spray Place 1 spray into both nostrils daily as needed for allergies or rhinitis.   GNP VITAMIN D 25 MCG (1000 UT) tablet Take 1,000 Units by mouth daily.   hydrochlorothiazide (HYDRODIURIL) 12.5 MG tablet Take 12.5 mg by mouth daily.   ipratropium (ATROVENT) 0.03 % nasal spray Place 2 sprays into both nostrils 3 (three) times daily as needed for rhinitis.   meclizine (ANTIVERT) 25 MG tablet Take 1 tablet (25 mg total) by mouth 3  (three) times daily as needed for dizziness or nausea.   nystatin (MYCOSTATIN/NYSTOP) powder Apply 1 application topically 2 (two) times daily as needed.   oxybutynin (DITROPAN-XL) 10 MG 24 hr tablet Take 1 tablet by mouth daily.   Oxycodone HCl 10 MG TABS Take 10 mg by mouth every 6 (six) hours as needed.   polyethylene glycol (MIRALAX / GLYCOLAX) 17 g packet Take 17 g by mouth daily as needed for mild constipation.   prasugrel (EFFIENT) 10 MG TABS tablet Take 1 tablet (10 mg total) by mouth daily.   senna-docusate (SENOKOT-S) 8.6-50 MG tablet Take 2 tablets by mouth daily as needed for mild constipation.    Allergies: Gabapentin and Plavix [clopidogrel bisulfate]  Social History   Tobacco Use   Smoking status: Every Day    Packs/day: 0.25    Types: Cigarettes   Smokeless tobacco: Never  Vaping Use   Vaping Use: Never used  Substance Use Topics   Alcohol use: No   Drug use: No    Family History  Problem Relation Age of Onset   Hypertension Sister     Review of Systems: A 12-system review of systems was performed and was negative except as noted in the HPI.  --------------------------------------------------------------------------------------------------  Physical Exam: BP 131/63 (BP Location: Left Arm, Patient  Position: Sitting, Cuff Size: Large)   Pulse (!) 107   Ht 5\' 6"  (1.676 m)   Wt 206 lb (93.4 kg)   SpO2 98%   BMI 33.25 kg/m  Position Blood pressure (mmHg) Heart rate (bpm)  Lying 127/68 97  Sitting 125/59 99  Standing 122/68 101  Standing (3 minutes) 223/66 108   General:  NAD HEENT: No conjunctival pallor or scleral icterus. Facemask in place. Neck: Supple without lymphadenopathy, thyromegaly, JVD, or HJR. No carotid bruit. Lungs: Normal work of breathing. Clear to auscultation bilaterally without wheezes or crackles. Heart: Regular rate and rhythm without murmurs, rubs, or gallops. Non-displaced PMI. Abd: Bowel sounds present. Soft, NT/ND without  hepatosplenomegaly Ext: Trace ankle edema bilaterally.  Pedal pulses not palpable in either lower extremity. Skin: Warm and dry without rash. Neuro: CNIII-XII intact. Strength and fine-touch sensation intact in upper and lower extremities bilaterally. Psych: Normal mood and affect.  EKG:  Sinus tachycardia.  Otherwise, no significant abnormality.  Lab Results  Component Value Date   WBC 8.5 10/24/2020   HGB 9.3 (L) 10/24/2020   HCT 28.4 (L) 10/24/2020   MCV 82.3 10/24/2020   PLT 492 (H) 10/24/2020    Lab Results  Component Value Date   NA 135 10/24/2020   K 4.0 10/24/2020   CL 104 10/24/2020   CO2 21 (L) 10/24/2020   BUN 11 10/24/2020   CREATININE 1.04 (H) 10/24/2020   GLUCOSE 70 10/24/2020   ALT 11 10/15/2020    Lab Results  Component Value Date   CHOL 89 10/16/2020   HDL 40 (L) 10/16/2020   LDLCALC 41 10/16/2020   TRIG 42 10/16/2020   CHOLHDL 2.2 10/16/2020    --------------------------------------------------------------------------------------------------  ASSESSMENT AND PLAN: Shortness of breath and elevated troponin: Patient reports intermittent dyspnea for at least a few months mild troponin elevation was noted at the time of her hospitalization in July after falling (she denies LOC at that time but was unable to get up on her own and was lying on hot pavement for an extended period of time).  Echocardiogram in July showed normal LVEF without regional wall motion abnormality.  Given multiple cardiac risk factors and need for vascular surgery in the near future, we have agreed to perform a pharmacologic myocardial perfusion stress test.  PAD with critical limb ischemia: Ongoing management per vascular surgery.  Consider transitioning from prasugrel to ticagrelor for antiplatelet therapy, given that prasugrel is relatively contraindicated in patients >63 y/o (patient has allergy to clopidogrel).  Continue atorvastatin for secondary prevention.  Dizziness: Unclear  if this represents lightheadedness, dysequilibrium, or vertigo.  Patient denies loss of consciousness.  Prior echo reassuring.  Orthostatic vitals negative today.  We will proceed with stress testing, as above.  We will defer ambulatory cardiac monitoring.  Preop: Patient unable to complete 4 METS of activity.  Given recent troponin elevation and chronic shortness of breath and plans for infrainguinal vascular surgery, we will obtain a pharmacologic myocardial perfusion stress test for further risk stratification.  I think it would be reasonable to proceed with fem-pop bypass if there are no high-risk findings on the stress test.  Follow-up: Return to clinic in 1 month.  >76, MD 12/06/2020 3:21 PM

## 2020-12-06 NOTE — Progress Notes (Signed)
Subjective:    Patient ID: Jody Lowe, female    DOB: 1945-06-27, 75 y.o.   MRN: [id number] Chief Complaint  Patient presents with   Follow-up    Add on per phone note from home health Rt foot wound     Jody Lowe is a 75 year old female that presents today following an angiogram on 10/23/2020.  It was felt that the patient had a fractured stent in due to this as well as the scar tissue, we were unable to move through the occlusion.  The patient subsequently developed a large wound on the dorsum of her right foot.  The wound bed is pale with slough.  Patient is concerned for some infection due to some of the drainage presenting from the wound.  Despite proper wound care this wound has not been healing at all.  This wound is also painful and she also has pain in her right lower extremity.  She denies any fevers or chills.  There are no other wounds on her right lower extremity.   Review of Systems  Cardiovascular:  Positive for leg swelling.  Skin:  Positive for wound.  All other systems reviewed and are negative.     Objective:   Physical Exam Vitals reviewed.  HENT:     Head: Normocephalic.  Cardiovascular:     Rate and Rhythm: Normal rate.  Pulmonary:     Effort: Pulmonary effort is normal.  Musculoskeletal:       Feet:  Skin:    General: Skin is warm and dry.  Neurological:     Mental Status: She is alert and oriented to person, place, and time.     Motor: Weakness present.     Gait: Gait abnormal.  Psychiatric:        Mood and Affect: Mood normal.        Behavior: Behavior normal.        Thought Content: Thought content normal.        Judgment: Judgment normal.    BP 120/68   Pulse 89   Ht 5\' 6"  (1.676 m)   Wt 200 lb (90.7 kg)   BMI 32.28 kg/m   Past Medical History:  Diagnosis Date   Anemia    Hypertension    Peripheral artery disease (HCC)    Renal disorder     Social History   Socioeconomic History   Marital status: Single    Spouse name:  Not on file   Number of children: Not on file   Years of education: Not on file   Highest education level: Not on file  Occupational History   Not on file  Tobacco Use   Smoking status: Every Day    Packs/day: 0.50    Types: Cigarettes   Smokeless tobacco: Never  Substance and Sexual Activity   Alcohol use: No   Drug use: No   Sexual activity: Not on file  Other Topics Concern   Not on file  Social History Narrative   Not on file   Social Determinants of Health   Financial Resource Strain: Not on file  Food Insecurity: Not on file  Transportation Needs: Not on file  Physical Activity: Not on file  Stress: Not on file  Social Connections: Not on file  Intimate Partner Violence: Not on file    Past Surgical History:  Procedure Laterality Date   BREAST REDUCTION SURGERY     CATARACT EXTRACTION W/PHACO Left 11/11/2019   Procedure: CATARACT EXTRACTION PHACO AND  INTRAOCULAR LENS PLACEMENT (IOC) LEFT VISION BLUE;  Surgeon: Elliot Cousin, MD;  Location: [facility];  Service: Ophthalmology;  Laterality: Left;  7.94 0:42.4   CATARACT EXTRACTION W/PHACO Right 12/23/2019   Procedure: CATARACT EXTRACTION PHACO AND INTRAOCULAR LENS PLACEMENT (IOC) RIGHT VISION BLUE 5.47  00:31.1;  Surgeon: Elliot Cousin, MD;  Location: [facility];  Service: Ophthalmology;  Laterality: Right;   LOWER EXTREMITY ANGIOGRAPHY N/A 10/16/2020   Procedure: Lower Extremity Angiography;  Surgeon: Annice Needy, MD;  Location: [facility] INVASIVE CV LAB;  Service: Cardiovascular;  Laterality: N/A;   LOWER EXTREMITY ANGIOGRAPHY Right 10/23/2020   Procedure: LOWER EXTREMITY ANGIOGRAPHY;  Surgeon: Annice Needy, MD;  Location: [facility] INVASIVE CV LAB;  Service: Cardiovascular;  Laterality: Right;    Family History  Problem Relation Age of Onset   Hypertension Sister     Allergies  Allergen Reactions   Gabapentin Other (See Comments)    Nervous, shaky   Plavix [Clopidogrel Bisulfate] Rash    CBC Latest  Ref Rng & Units 10/24/2020 10/23/2020 10/22/2020  WBC 4.0 - 10.5 K/uL 8.5 7.6 8.3  Hemoglobin 12.0 - 15.0 g/dL 4.2(V) 8.1(L) 7.6(L)  Hematocrit 36.0 - 46.0 % 28.4(L) 24.5(L) 22.8(L)  Platelets 150 - 400 K/uL 492(H) 425(H) 391      CMP     Component Value Date/Time   NA 135 10/24/2020 0526   K 4.0 10/24/2020 0526   CL 104 10/24/2020 0526   CO2 21 (L) 10/24/2020 0526   GLUCOSE 70 10/24/2020 0526   BUN 11 10/24/2020 0526   CREATININE 1.04 (H) 10/24/2020 0526   CALCIUM 8.8 (L) 10/24/2020 0526   PROT 6.3 (L) 10/15/2020 0250   ALBUMIN 2.9 (L) 10/15/2020 0250   AST 24 10/15/2020 0250   ALT 11 10/15/2020 0250   ALKPHOS 86 10/15/2020 0250   BILITOT 0.7 10/15/2020 0250   GFRNONAA 56 (L) 10/24/2020 0526   GFRAA >60 09/20/2019 1312     No results found.     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  This represents a limb threatening ischemia and places the patient at the risk for limb loss.  Angiography has been performed and the situation is not ideal for intervention.  Given this finding open surgical repair is recommended.   Patient should undergo arterial reconstruction of the lower extremity with the hope for limb salvage.  The risks and benefits as well as the alternative therapies was discussed in detail with the patient.  All questions were answered.  Patient agrees to proceed with bypass surgery.  We have placed an urgent referral to cardiology to try to expedite cardiac clearance.  The patient will follow up with me in the office after the procedure.     - Ambulatory referral to Cardiology  2. Tobacco use disorder Smoking cessation was discussed, 3-10 minutes spent on this topic specifically   3. Hyperlipidemia, unspecified hyperlipidemia type Continue statin  as ordered and reviewed, no changes at this time    Current Outpatient Medications on File Prior to Visit  Medication Sig Dispense Refill   albuterol (VENTOLIN HFA) 108 (90 Base) MCG/ACT inhaler Inhale 1-2 puffs into the lungs every 6 (six) hours as needed for wheezing or shortness of breath.     alendronate (FOSAMAX) 70 MG tablet Take 70 mg by  mouth once a week.     amitriptyline (ELAVIL) 25 MG tablet Take 25 mg by mouth at bedtime.     aspirin EC 81 MG tablet Take 81 mg by mouth daily.     atorvastatin (LIPITOR) 20 MG tablet Take 1 tablet (20 mg total) by mouth at bedtime. 30 tablet 0   Cholecalciferol (D3-1000) 25 MCG (1000 UT) capsule Take 1,000 Units by mouth daily.     diclofenac Sodium (VOLTAREN) 1 % GEL Apply 2 g topically 4 (four) times daily.     esomeprazole (NEXIUM) 40 MG capsule Take 40 mg by mouth daily.     ferrous sulfate 325 (65 FE) MG tablet Take 325 mg by mouth every other day.     FLUoxetine (PROZAC) 40 MG capsule Take 2 capsules (80 mg total) by mouth daily. Take one capsule (40 mg) by mouth once daily for one month, then increase to two capsules (80 mg) thereafter  3   fluticasone (FLONASE) 50 MCG/ACT nasal spray Place 1 spray into both nostrils daily.     ipratropium (ATROVENT) 0.03 % nasal spray Place 2 sprays into both nostrils 3 (three) times daily.     meclizine (ANTIVERT) 25 MG tablet Take 1 tablet (25 mg total) by mouth 3 (three) times daily as needed for dizziness or nausea. 30 tablet 1   nicotine (NICODERM CQ - DOSED IN MG/24 HOURS) 21 mg/24hr patch One 21 mg patch chest wall daily (okay to substitute generic) 28 patch 0   nystatin (MYCOSTATIN/NYSTOP) powder Apply 1 application topically 2 (two) times daily.     oxybutynin (DITROPAN-XL) 10 MG 24 hr tablet Take 1 tablet by mouth daily.     polyethylene glycol (MIRALAX / GLYCOLAX) 17 g packet Take 17 g by mouth daily as needed for mild constipation.     prasugrel (EFFIENT) 10 MG TABS tablet Take 1 tablet (10 mg  total) by mouth daily. 30 tablet 0   senna-docusate (SENOKOT-S) 8.6-50 MG tablet Take 2 tablets by mouth daily.     vitamin B-12 (CYANOCOBALAMIN) 1000 MCG tablet Take 1 tablet (1,000 mcg total) by mouth daily. 30 tablet 0   GNP VITAMIN D 25 MCG (1000 UT) tablet Take 1,000 Units by mouth daily.     hydrochlorothiazide (HYDRODIURIL) 12.5 MG tablet Take 12.5 mg by mouth daily.     Oxycodone HCl 10 MG TABS Take 10 mg by mouth every 6 (six) hours as needed.     No current facility-administered medications on file prior to visit.    There are no Patient Instructions on file for this visit. No follow-ups on file.   Georgiana Spinner, NP

## 2020-12-06 NOTE — Telephone Encounter (Signed)
Jody Lowe from [place] were made aware with medical recomendations

## 2020-12-06 NOTE — H&P (View-Only) (Signed)
 Subjective:    Patient ID: Jody Lowe, female    DOB: 08/31/1945, 75 y.o.   MRN: [id number] Chief Complaint  Patient presents with   Follow-up    Add on per phone note from home health Rt foot wound     Jody Lowe is a 75-year-old female that presents today following an angiogram on 10/23/2020.  It was felt that the patient had a fractured stent in due to this as well as the scar tissue, we were unable to move through the occlusion.  The patient subsequently developed a large wound on the dorsum of her right foot.  The wound bed is pale with slough.  Patient is concerned for some infection due to some of the drainage presenting from the wound.  Despite proper wound care this wound has not been healing at all.  This wound is also painful and she also has pain in her right lower extremity.  She denies any fevers or chills.  There are no other wounds on her right lower extremity.   Review of Systems  Cardiovascular:  Positive for leg swelling.  Skin:  Positive for wound.  All other systems reviewed and are negative.     Objective:   Physical Exam Vitals reviewed.  HENT:     Head: Normocephalic.  Cardiovascular:     Rate and Rhythm: Normal rate.  Pulmonary:     Effort: Pulmonary effort is normal.  Musculoskeletal:       Feet:  Skin:    General: Skin is warm and dry.  Neurological:     Mental Status: She is alert and oriented to person, place, and time.     Motor: Weakness present.     Gait: Gait abnormal.  Psychiatric:        Mood and Affect: Mood normal.        Behavior: Behavior normal.        Thought Content: Thought content normal.        Judgment: Judgment normal.    BP 120/68   Pulse 89   Ht 5' 6" (1.676 m)   Wt 200 lb (90.7 kg)   BMI 32.28 kg/m   Past Medical History:  Diagnosis Date   Anemia    Hypertension    Peripheral artery disease (HCC)    Renal disorder     Social History   Socioeconomic History   Marital status: Single    Spouse name:  Not on file   Number of children: Not on file   Years of education: Not on file   Highest education level: Not on file  Occupational History   Not on file  Tobacco Use   Smoking status: Every Day    Packs/day: 0.50    Types: Cigarettes   Smokeless tobacco: Never  Substance and Sexual Activity   Alcohol use: No   Drug use: No   Sexual activity: Not on file  Other Topics Concern   Not on file  Social History Narrative   Not on file   Social Determinants of Health   Financial Resource Strain: Not on file  Food Insecurity: Not on file  Transportation Needs: Not on file  Physical Activity: Not on file  Stress: Not on file  Social Connections: Not on file  Intimate Partner Violence: Not on file    Past Surgical History:  Procedure Laterality Date   BREAST REDUCTION SURGERY     CATARACT EXTRACTION W/PHACO Left 11/11/2019   Procedure: CATARACT EXTRACTION PHACO AND   INTRAOCULAR LENS PLACEMENT (IOC) LEFT VISION BLUE;  Surgeon: Elliot Cousin, MD;  Location: [facility];  Service: Ophthalmology;  Laterality: Left;  7.94 0:42.4   CATARACT EXTRACTION W/PHACO Right 12/23/2019   Procedure: CATARACT EXTRACTION PHACO AND INTRAOCULAR LENS PLACEMENT (IOC) RIGHT VISION BLUE 5.47  00:31.1;  Surgeon: Elliot Cousin, MD;  Location: [facility];  Service: Ophthalmology;  Laterality: Right;   LOWER EXTREMITY ANGIOGRAPHY N/A 10/16/2020   Procedure: Lower Extremity Angiography;  Surgeon: Annice Needy, MD;  Location: [facility] INVASIVE CV LAB;  Service: Cardiovascular;  Laterality: N/A;   LOWER EXTREMITY ANGIOGRAPHY Right 10/23/2020   Procedure: LOWER EXTREMITY ANGIOGRAPHY;  Surgeon: Annice Needy, MD;  Location: [facility] INVASIVE CV LAB;  Service: Cardiovascular;  Laterality: Right;    Family History  Problem Relation Age of Onset   Hypertension Sister     Allergies  Allergen Reactions   Gabapentin Other (See Comments)    Nervous, shaky   Plavix [Clopidogrel Bisulfate] Rash    CBC Latest  Ref Rng & Units 10/24/2020 10/23/2020 10/22/2020  WBC 4.0 - 10.5 K/uL 8.5 7.6 8.3  Hemoglobin 12.0 - 15.0 g/dL 4.2(V) 8.1(L) 7.6(L)  Hematocrit 36.0 - 46.0 % 28.4(L) 24.5(L) 22.8(L)  Platelets 150 - 400 K/uL 492(H) 425(H) 391      CMP     Component Value Date/Time   NA 135 10/24/2020 0526   K 4.0 10/24/2020 0526   CL 104 10/24/2020 0526   CO2 21 (L) 10/24/2020 0526   GLUCOSE 70 10/24/2020 0526   BUN 11 10/24/2020 0526   CREATININE 1.04 (H) 10/24/2020 0526   CALCIUM 8.8 (L) 10/24/2020 0526   PROT 6.3 (L) 10/15/2020 0250   ALBUMIN 2.9 (L) 10/15/2020 0250   AST 24 10/15/2020 0250   ALT 11 10/15/2020 0250   ALKPHOS 86 10/15/2020 0250   BILITOT 0.7 10/15/2020 0250   GFRNONAA 56 (L) 10/24/2020 0526   GFRAA >60 09/20/2019 1312     No results found.     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  This represents a limb threatening ischemia and places the patient at the risk for limb loss.  Angiography has been performed and the situation is not ideal for intervention.  Given this finding open surgical repair is recommended.   Patient should undergo arterial reconstruction of the lower extremity with the hope for limb salvage.  The risks and benefits as well as the alternative therapies was discussed in detail with the patient.  All questions were answered.  Patient agrees to proceed with bypass surgery.  We have placed an urgent referral to cardiology to try to expedite cardiac clearance.  The patient will follow up with me in the office after the procedure.     - Ambulatory referral to Cardiology  2. Tobacco use disorder Smoking cessation was discussed, 3-10 minutes spent on this topic specifically   3. Hyperlipidemia, unspecified hyperlipidemia type Continue statin  as ordered and reviewed, no changes at this time    Current Outpatient Medications on File Prior to Visit  Medication Sig Dispense Refill   albuterol (VENTOLIN HFA) 108 (90 Base) MCG/ACT inhaler Inhale 1-2 puffs into the lungs every 6 (six) hours as needed for wheezing or shortness of breath.     alendronate (FOSAMAX) 70 MG tablet Take 70 mg by  mouth once a week.     amitriptyline (ELAVIL) 25 MG tablet Take 25 mg by mouth at bedtime.     aspirin EC 81 MG tablet Take 81 mg by mouth daily.     atorvastatin (LIPITOR) 20 MG tablet Take 1 tablet (20 mg total) by mouth at bedtime. 30 tablet 0   Cholecalciferol (D3-1000) 25 MCG (1000 UT) capsule Take 1,000 Units by mouth daily.     diclofenac Sodium (VOLTAREN) 1 % GEL Apply 2 g topically 4 (four) times daily.     esomeprazole (NEXIUM) 40 MG capsule Take 40 mg by mouth daily.     ferrous sulfate 325 (65 FE) MG tablet Take 325 mg by mouth every other day.     FLUoxetine (PROZAC) 40 MG capsule Take 2 capsules (80 mg total) by mouth daily. Take one capsule (40 mg) by mouth once daily for one month, then increase to two capsules (80 mg) thereafter  3   fluticasone (FLONASE) 50 MCG/ACT nasal spray Place 1 spray into both nostrils daily.     ipratropium (ATROVENT) 0.03 % nasal spray Place 2 sprays into both nostrils 3 (three) times daily.     meclizine (ANTIVERT) 25 MG tablet Take 1 tablet (25 mg total) by mouth 3 (three) times daily as needed for dizziness or nausea. 30 tablet 1   nicotine (NICODERM CQ - DOSED IN MG/24 HOURS) 21 mg/24hr patch One 21 mg patch chest wall daily (okay to substitute generic) 28 patch 0   nystatin (MYCOSTATIN/NYSTOP) powder Apply 1 application topically 2 (two) times daily.     oxybutynin (DITROPAN-XL) 10 MG 24 hr tablet Take 1 tablet by mouth daily.     polyethylene glycol (MIRALAX / GLYCOLAX) 17 g packet Take 17 g by mouth daily as needed for mild constipation.     prasugrel (EFFIENT) 10 MG TABS tablet Take 1 tablet (10 mg  total) by mouth daily. 30 tablet 0   senna-docusate (SENOKOT-S) 8.6-50 MG tablet Take 2 tablets by mouth daily.     vitamin B-12 (CYANOCOBALAMIN) 1000 MCG tablet Take 1 tablet (1,000 mcg total) by mouth daily. 30 tablet 0   GNP VITAMIN D 25 MCG (1000 UT) tablet Take 1,000 Units by mouth daily.     hydrochlorothiazide (HYDRODIURIL) 12.5 MG tablet Take 12.5 mg by mouth daily.     Oxycodone HCl 10 MG TABS Take 10 mg by mouth every 6 (six) hours as needed.     No current facility-administered medications on file prior to visit.    There are no Patient Instructions on file for this visit. No follow-ups on file.   Higinio Grow E Geroge Gilliam, NP    

## 2020-12-06 NOTE — Telephone Encounter (Signed)
Continue with current dressings.

## 2020-12-06 NOTE — Patient Instructions (Signed)
Medication Instructions:   Your physician recommends that you continue on your current medications as directed. Please refer to the Current Medication list given to you today.  *If you need a refill on your cardiac medications before your next appointment, please call your pharmacy*   Lab Work:  None ordered  Testing/Procedures:  [facility] MYOVIEW  Your caregiver has ordered a Chartered certified accountant) Stress Test with nuclear imaging. The purpose of this test is to evaluate the blood supply to your heart muscle. This procedure is referred to as a "Non-Invasive Stress Test." This is because other than having an IV started in your vein, nothing is inserted or "invades" your body. Cardiac stress tests are done to find areas of poor blood flow to the heart by determining the extent of coronary artery disease (CAD). Some patients exercise on a treadmill, which naturally increases the blood flow to your heart, while others who are  unable to walk on a treadmill due to physical limitations have a pharmacologic/chemical stress agent called Lexiscan . This medicine will mimic walking on a treadmill by temporarily increasing your coronary blood flow.   Please note: these test may take anywhere between 2-4 hours to complete  PLEASE REPORT TO [facility] MEDICAL MALL ENTRANCE  THE VOLUNTEERS AT THE FIRST DESK WILL DIRECT YOU WHERE TO GO  Date of Procedure:_____________________________________  Arrival Time for Procedure:______________________________  Instructions regarding medication:   ____ : Hold diabetes medication morning of procedure  ____:  Hold betablocker(s) night before procedure and morning of procedure  ____:  Hold other medications as follows:   PLEASE NOTIFY THE OFFICE AT LEAST 24 HOURS IN ADVANCE IF YOU ARE UNABLE TO KEEP YOUR APPOINTMENT.  [phone] AND  PLEASE NOTIFY NUCLEAR MEDICINE AT [facility] AT LEAST 24 HOURS IN ADVANCE IF YOU ARE UNABLE TO KEEP YOUR APPOINTMENT. [phone]  How to  prepare for your Myoview test:  Do not eat or drink after midnight No caffeine for 24 hours prior to test No smoking 24 hours prior to test. Your medication may be taken with water.  If your doctor stopped a medication because of this test, do not take that medication. Ladies, please do not wear dresses.  Skirts or pants are appropriate. Please wear a short sleeve shirt. No perfume, cologne or lotion.   Follow-Up: At [facility], you and your health needs are our priority.  As part of our continuing mission to provide you with exceptional heart care, we have created designated Provider Care Teams.  These Care Teams include your primary Cardiologist (physician) and Advanced Practice Providers (APPs -  Physician Assistants and Nurse Practitioners) who all work together to provide you with the care you need, when you need it.  We recommend signing up for the patient portal called "MyChart".  Sign up information is provided on this After Visit Summary.  MyChart is used to connect with patients for Virtual Visits (Telemedicine).  Patients are able to view lab/test results, encounter notes, upcoming appointments, etc.  Non-urgent messages can be sent to your provider as well.   To learn more about what you can do with MyChart, go to [email or web address].    Your next appointment:   1 month(s) w/ APP  The format for your next appointment:   In Person  Provider:   You may see one of the following Advanced Practice Providers on your designated Care Team:   Nicolasa Ducking, NP Eula Listen, PA-C Marisue Ivan, PA-C [place], [place]

## 2020-12-07 ENCOUNTER — Encounter: Payer: Self-pay | Admitting: Internal Medicine

## 2020-12-07 DIAGNOSIS — R0602 Shortness of breath: Secondary | ICD-10-CM | POA: Insufficient documentation

## 2020-12-07 DIAGNOSIS — I70229 Atherosclerosis of native arteries of extremities with rest pain, unspecified extremity: Secondary | ICD-10-CM | POA: Insufficient documentation

## 2020-12-12 ENCOUNTER — Other Ambulatory Visit (INDEPENDENT_AMBULATORY_CARE_PROVIDER_SITE_OTHER): Payer: Self-pay | Admitting: Nurse Practitioner

## 2020-12-12 DIAGNOSIS — Z01810 Encounter for preprocedural cardiovascular examination: Secondary | ICD-10-CM

## 2020-12-12 DIAGNOSIS — I7025 Atherosclerosis of native arteries of other extremities with ulceration: Secondary | ICD-10-CM

## 2020-12-13 ENCOUNTER — Ambulatory Visit (INDEPENDENT_AMBULATORY_CARE_PROVIDER_SITE_OTHER): Payer: Medicare Other

## 2020-12-13 ENCOUNTER — Other Ambulatory Visit: Payer: Self-pay

## 2020-12-13 DIAGNOSIS — I7025 Atherosclerosis of native arteries of other extremities with ulceration: Secondary | ICD-10-CM | POA: Diagnosis not present

## 2020-12-13 DIAGNOSIS — Z01810 Encounter for preprocedural cardiovascular examination: Secondary | ICD-10-CM | POA: Diagnosis not present

## 2020-12-15 ENCOUNTER — Encounter
Admission: RE | Admit: 2020-12-15 | Discharge: 2020-12-15 | Disposition: A | Discharge status: Home / Self Care | Payer: Medicare Other | Source: Ambulatory Visit | Attending: Internal Medicine | Admitting: Internal Medicine

## 2020-12-15 ENCOUNTER — Other Ambulatory Visit: Payer: Self-pay

## 2020-12-15 DIAGNOSIS — Z01810 Encounter for preprocedural cardiovascular examination: Secondary | ICD-10-CM

## 2020-12-15 DIAGNOSIS — Z01818 Encounter for other preprocedural examination: Secondary | ICD-10-CM | POA: Insufficient documentation

## 2020-12-15 DIAGNOSIS — R0602 Shortness of breath: Secondary | ICD-10-CM | POA: Insufficient documentation

## 2020-12-15 LAB — NM MYOCAR MULTI W/SPECT W/WALL MOTION / EF
LV dias vol: 94 mL (ref 46–106)
LV sys vol: 41 mL
Nuc Stress EF: 56 %
Peak HR: 98 {beats}/min
Rest HR: 75 {beats}/min
Rest Nuclear Isotope Dose: 10.3 mCi
SDS: 0
SRS: 18
SSS: 12
ST Depression (mm): 0 mm
Stress Nuclear Isotope Dose: 30.6 mCi
TID: 0.82

## 2020-12-15 MED ORDER — TECHNETIUM TC 99M TETROFOSMIN IV KIT
10.0000 | PACK | Freq: Once | INTRAVENOUS | Status: AC | PRN
  Administered 2020-12-15: 10.29 via INTRAVENOUS

## 2020-12-15 MED ORDER — REGADENOSON 0.4 MG/5ML IV SOLN
0.4000 mg | Freq: Once | INTRAVENOUS | Status: AC
  Administered 2020-12-15: 0.4 mg via INTRAVENOUS

## 2020-12-15 MED ORDER — TECHNETIUM TC 99M TETROFOSMIN IV KIT
30.0000 | PACK | Freq: Once | INTRAVENOUS | Status: AC | PRN
  Administered 2020-12-15: 30.6 via INTRAVENOUS

## 2020-12-26 ENCOUNTER — Encounter (INDEPENDENT_AMBULATORY_CARE_PROVIDER_SITE_OTHER): Payer: Self-pay

## 2020-12-28 ENCOUNTER — Encounter
Admission: RE | Admit: 2020-12-28 | Discharge: 2020-12-28 | Disposition: A | Discharge status: Home / Self Care | Payer: Medicare Other | Source: Ambulatory Visit | Attending: Vascular Surgery | Admitting: Vascular Surgery

## 2020-12-28 ENCOUNTER — Other Ambulatory Visit (INDEPENDENT_AMBULATORY_CARE_PROVIDER_SITE_OTHER): Payer: Self-pay | Admitting: Nurse Practitioner

## 2020-12-28 ENCOUNTER — Other Ambulatory Visit: Payer: Self-pay

## 2020-12-28 DIAGNOSIS — Z01818 Encounter for other preprocedural examination: Secondary | ICD-10-CM

## 2020-12-28 NOTE — Patient Instructions (Signed)
Your procedure is scheduled on: Wednesday 01/03/21 Report to DAY SURGERY DEPARTMENT LOCATED ON 2ND FLOOR MEDICAL MALL ENTRANCE. To find out your arrival time please call [phone] between 1PM - 3PM on Tuesday 01/02/21.  Remember: Instructions that are not followed completely may result in serious medical risk, up to and including death, or upon the discretion of your surgeon and anesthesiologist your surgery may need to be rescheduled.     _X__ 1. Do not eat food or drink any liquids after midnight the night before your procedure.                 No gum chewing or hard candies.   __X__2.  On the morning of surgery brush your teeth with toothpaste and water, you                 may rinse your mouth with mouthwash if you wish.  Do not swallow any              toothpaste of mouthwash.     _X__ 3.  No Alcohol for 24 hours before or after surgery.   _X__ 4.  Do Not Smoke or use e-cigarettes For 24 Hours Prior to Your Surgery.                 Do not use any chewable tobacco products for at least 6 hours prior to                 surgery.  ____  5.  Bring all medications with you on the day of surgery if instructed.   __X__  6.  Notify your doctor if there is any change in your medical condition      (cold, fever, infections).     Do not wear jewelry, make-up, hairpins, clips or nail polish. Do not wear lotions, powders, or perfumes.  Do not shave body hair 48 hours prior to surgery. Men may shave face and neck. Do not bring valuables to the hospital.    [facility] is not responsible for any belongings or valuables.  Contacts, dentures/partials or body piercings may not be worn into surgery. Bring a case for your contacts, glasses or hearing aids, a denture cup will be supplied. Leave your suitcase in the car. After surgery it may be brought to your room. For patients admitted to the hospital, discharge time is determined by your treatment team.   Patients discharged the day of  surgery will not be allowed to drive home.   Please read over the following fact sheets that you were given:   MRSA Information, CHG soap  __X__ Take these medicines the morning of surgery with A SIP OF WATER:    1. esomeprazole (NEXIUM) 40 MG capsule  2. FLUoxetine (PROZAC) 80 MG capsule  3. oxybutynin (DITROPAN-XL) 10 MG 24 hr tablet  4.  5.  6.  ____ Fleet Enema (as directed)   __X__ Use CHG Soap/SAGE wipes as directed  __X__ Use inhalers on the day of surgery   albuterol (VENTOLIN HFA) 108 (90 Base) MCG/ACT inhaler  ____ Stop metformin/Janumet/Farxiga 2 days prior to surgery    ____ Take 1/2 of usual insulin dose the night before surgery. No insulin the morning          of surgery.   __X__ Stop YOUR Blood Thinners prasugrel (EFFIENT) 10 MG TABS tablet today (12/28/20) CONTINUE TAKING YOUR ASPIRIN 81 MG DAILY EXCEPT ON THE DAY OF YOUR SURGERY  __X__ Stop  Anti-inflammatories 7 days before surgery such as Advil, Ibuprofen, Motrin,  BC or Goodies Powder, Naprosyn, Naproxen, Aleve,   __X__ Stop all herbal supplements, fish oil or vitamin E until after surgery.    ____ Bring C-Pap to the hospital.   Please call [phone] for any questions about these instructions.

## 2021-01-01 ENCOUNTER — Encounter: Payer: Self-pay | Admitting: Urgent Care

## 2021-01-01 ENCOUNTER — Other Ambulatory Visit: Payer: Self-pay

## 2021-01-01 ENCOUNTER — Other Ambulatory Visit
Admission: RE | Admit: 2021-01-01 | Discharge: 2021-01-01 | Disposition: A | Discharge status: Home / Self Care | Payer: Medicare Other | Source: Ambulatory Visit | Attending: Vascular Surgery | Admitting: Vascular Surgery

## 2021-01-01 DIAGNOSIS — Z01812 Encounter for preprocedural laboratory examination: Secondary | ICD-10-CM | POA: Insufficient documentation

## 2021-01-01 DIAGNOSIS — Z20822 Contact with and (suspected) exposure to covid-19: Secondary | ICD-10-CM | POA: Insufficient documentation

## 2021-01-01 DIAGNOSIS — Z01818 Encounter for other preprocedural examination: Secondary | ICD-10-CM

## 2021-01-01 LAB — CBC WITH DIFFERENTIAL/PLATELET
Abs Immature Granulocytes: 0.02 10*3/uL (ref 0.00–0.07)
Basophils Absolute: 0.1 10*3/uL (ref 0.0–0.1)
Basophils Relative: 1 %
Eosinophils Absolute: 0.3 10*3/uL (ref 0.0–0.5)
Eosinophils Relative: 4 %
HCT: 26.7 % — ABNORMAL LOW (ref 36.0–46.0)
Hemoglobin: 8.3 g/dL — ABNORMAL LOW (ref 12.0–15.0)
Immature Granulocytes: 0 %
Lymphocytes Relative: 15 %
Lymphs Abs: 1 10*3/uL (ref 0.7–4.0)
MCH: 25.5 pg — ABNORMAL LOW (ref 26.0–34.0)
MCHC: 31.1 g/dL (ref 30.0–36.0)
MCV: 81.9 fL (ref 80.0–100.0)
Monocytes Absolute: 0.5 10*3/uL (ref 0.1–1.0)
Monocytes Relative: 7 %
Neutro Abs: 5 10*3/uL (ref 1.7–7.7)
Neutrophils Relative %: 73 %
Platelets: 541 10*3/uL — ABNORMAL HIGH (ref 150–400)
RBC: 3.26 MIL/uL — ABNORMAL LOW (ref 3.87–5.11)
RDW: 16.4 % — ABNORMAL HIGH (ref 11.5–15.5)
WBC: 6.8 10*3/uL (ref 4.0–10.5)
nRBC: 0 % (ref 0.0–0.2)

## 2021-01-01 LAB — BASIC METABOLIC PANEL
Anion gap: 7 (ref 5–15)
BUN: 10 mg/dL (ref 8–23)
CO2: 26 mmol/L (ref 22–32)
Calcium: 9.3 mg/dL (ref 8.9–10.3)
Chloride: 100 mmol/L (ref 98–111)
Creatinine, Ser: 1.02 mg/dL — ABNORMAL HIGH (ref 0.44–1.00)
GFR, Estimated: 57 mL/min — ABNORMAL LOW (ref >60)
Glucose, Bld: 87 mg/dL (ref 70–99)
Potassium: 4 mmol/L (ref 3.5–5.1)
Sodium: 133 mmol/L — ABNORMAL LOW (ref 135–145)

## 2021-01-02 LAB — SARS CORONAVIRUS 2 (TAT 6-24 HRS): SARS Coronavirus 2: NEGATIVE

## 2021-01-02 MED ORDER — CEFAZOLIN SODIUM-DEXTROSE 2-4 GM/100ML-% IV SOLN
2.0000 g | INTRAVENOUS | Status: AC
  Administered 2021-01-03 (×2): 2 g via INTRAVENOUS

## 2021-01-02 MED ORDER — METHYLPREDNISOLONE SODIUM SUCC 125 MG IJ SOLR: 125.0000 mg | Freq: Once | INTRAMUSCULAR | Status: DC | PRN

## 2021-01-02 MED ORDER — CHLORHEXIDINE GLUCONATE 0.12 % MT SOLN: 15.0000 mL | Freq: Once | OROMUCOSAL | Status: DC

## 2021-01-02 MED ORDER — CEFAZOLIN SODIUM-DEXTROSE 2-4 GM/100ML-% IV SOLN: 2.0000 g | Freq: Once | INTRAVENOUS | Status: DC

## 2021-01-02 MED ORDER — LACTATED RINGERS IV SOLN: INTRAVENOUS | Status: DC

## 2021-01-02 MED ORDER — CHLORHEXIDINE GLUCONATE CLOTH 2 % EX PADS: 6.0000 | MEDICATED_PAD | Freq: Once | CUTANEOUS | Status: DC

## 2021-01-02 MED ORDER — DIPHENHYDRAMINE HCL 50 MG/ML IJ SOLN: 50.0000 mg | Freq: Once | INTRAMUSCULAR | Status: DC | PRN

## 2021-01-02 MED ORDER — MIDAZOLAM HCL 2 MG/ML PO SYRP: 8.0000 mg | ORAL_SOLUTION | Freq: Once | ORAL | Status: DC | PRN

## 2021-01-02 MED ORDER — ORAL CARE MOUTH RINSE: 15.0000 mL | Freq: Once | OROMUCOSAL | Status: DC

## 2021-01-02 MED ORDER — FAMOTIDINE 20 MG PO TABS: 40.0000 mg | ORAL_TABLET | Freq: Once | ORAL | Status: DC | PRN

## 2021-01-03 ENCOUNTER — Inpatient Hospital Stay
Admission: RE | Admit: 2021-01-03 | Discharge: 2021-01-09 | DRG: 253 | Disposition: A | Discharge status: Home / Self Care | Payer: Medicare Other | Attending: Vascular Surgery | Admitting: Vascular Surgery

## 2021-01-03 ENCOUNTER — Encounter
Admission: RE | Disposition: A | Discharge status: Home / Self Care | Payer: Self-pay | Source: Home / Self Care | Attending: Vascular Surgery

## 2021-01-03 ENCOUNTER — Inpatient Hospital Stay: Payer: Medicare Other | Admitting: Certified Registered Nurse Anesthetist

## 2021-01-03 ENCOUNTER — Encounter: Payer: Self-pay | Admitting: Vascular Surgery

## 2021-01-03 DIAGNOSIS — D62 Acute posthemorrhagic anemia: Secondary | ICD-10-CM | POA: Diagnosis not present

## 2021-01-03 DIAGNOSIS — Y832 Surgical operation with anastomosis, bypass or graft as the cause of abnormal reaction of the patient, or of later complication, without mention of misadventure at the time of the procedure: Secondary | ICD-10-CM | POA: Diagnosis not present

## 2021-01-03 DIAGNOSIS — Z79899 Other long term (current) drug therapy: Secondary | ICD-10-CM

## 2021-01-03 DIAGNOSIS — Z888 Allergy status to other drugs, medicaments and biological substances status: Secondary | ICD-10-CM

## 2021-01-03 DIAGNOSIS — L97919 Non-pressure chronic ulcer of unspecified part of right lower leg with unspecified severity: Secondary | ICD-10-CM | POA: Diagnosis present

## 2021-01-03 DIAGNOSIS — Z01818 Encounter for other preprocedural examination: Secondary | ICD-10-CM

## 2021-01-03 DIAGNOSIS — I70299 Other atherosclerosis of native arteries of extremities, unspecified extremity: Secondary | ICD-10-CM | POA: Diagnosis present

## 2021-01-03 DIAGNOSIS — F17210 Nicotine dependence, cigarettes, uncomplicated: Secondary | ICD-10-CM | POA: Diagnosis present

## 2021-01-03 DIAGNOSIS — Z7983 Long term (current) use of bisphosphonates: Secondary | ICD-10-CM

## 2021-01-03 DIAGNOSIS — T82858A Stenosis of vascular prosthetic devices, implants and grafts, initial encounter: Secondary | ICD-10-CM | POA: Diagnosis not present

## 2021-01-03 DIAGNOSIS — D72829 Elevated white blood cell count, unspecified: Secondary | ICD-10-CM | POA: Diagnosis present

## 2021-01-03 DIAGNOSIS — E785 Hyperlipidemia, unspecified: Secondary | ICD-10-CM | POA: Diagnosis present

## 2021-01-03 DIAGNOSIS — I10 Essential (primary) hypertension: Secondary | ICD-10-CM | POA: Diagnosis present

## 2021-01-03 DIAGNOSIS — Z20822 Contact with and (suspected) exposure to covid-19: Secondary | ICD-10-CM | POA: Diagnosis present

## 2021-01-03 DIAGNOSIS — Z8249 Family history of ischemic heart disease and other diseases of the circulatory system: Secondary | ICD-10-CM

## 2021-01-03 DIAGNOSIS — Z7982 Long term (current) use of aspirin: Secondary | ICD-10-CM

## 2021-01-03 DIAGNOSIS — Z7902 Long term (current) use of antithrombotics/antiplatelets: Secondary | ICD-10-CM | POA: Diagnosis not present

## 2021-01-03 DIAGNOSIS — I70235 Atherosclerosis of native arteries of right leg with ulceration of other part of foot: Principal | ICD-10-CM | POA: Diagnosis present

## 2021-01-03 DIAGNOSIS — L97909 Non-pressure chronic ulcer of unspecified part of unspecified lower leg with unspecified severity: Secondary | ICD-10-CM | POA: Diagnosis present

## 2021-01-03 DIAGNOSIS — L97519 Non-pressure chronic ulcer of other part of right foot with unspecified severity: Secondary | ICD-10-CM | POA: Diagnosis not present

## 2021-01-03 HISTORY — PX: FEMORAL-TIBIAL BYPASS GRAFT: SHX938

## 2021-01-03 HISTORY — PX: VEIN HARVEST: SHX6363

## 2021-01-03 LAB — CBC
HCT: 30 % — ABNORMAL LOW (ref 36.0–46.0)
Hemoglobin: 8.8 g/dL — ABNORMAL LOW (ref 12.0–15.0)
MCH: 25 pg — ABNORMAL LOW (ref 26.0–34.0)
MCHC: 29.3 g/dL — ABNORMAL LOW (ref 30.0–36.0)
MCV: 85.2 fL (ref 80.0–100.0)
Platelets: 636 10*3/uL — ABNORMAL HIGH (ref 150–400)
RBC: 3.52 MIL/uL — ABNORMAL LOW (ref 3.87–5.11)
RDW: 16.5 % — ABNORMAL HIGH (ref 11.5–15.5)
WBC: 8.5 10*3/uL (ref 4.0–10.5)
nRBC: 0 % (ref 0.0–0.2)

## 2021-01-03 LAB — GLUCOSE, CAPILLARY: Glucose-Capillary: 168 mg/dL — ABNORMAL HIGH (ref 70–99)

## 2021-01-03 LAB — CREATININE, SERUM
Creatinine, Ser: 1.06 mg/dL — ABNORMAL HIGH (ref 0.44–1.00)
GFR, Estimated: 54 mL/min — ABNORMAL LOW (ref >60)

## 2021-01-03 LAB — MRSA NEXT GEN BY PCR, NASAL: MRSA by PCR Next Gen: NOT DETECTED

## 2021-01-03 SURGERY — APPLICATION OF CELL SAVER
Anesthesia: General | Site: Leg Lower | Laterality: Right

## 2021-01-03 MED ORDER — FERROUS SULFATE 325 (65 FE) MG PO TABS
325.0000 mg | ORAL_TABLET | ORAL | Status: DC
  Administered 2021-01-03 – 2021-01-09 (×4): 325 mg via ORAL
  Filled 2021-01-03 (×5): qty 1

## 2021-01-03 MED ORDER — SODIUM CHLORIDE 0.9 % IV SOLN: INTRAVENOUS | Status: DC

## 2021-01-03 MED ORDER — LIDOCAINE HCL (CARDIAC) PF 100 MG/5ML IV SOSY
PREFILLED_SYRINGE | INTRAVENOUS | Status: DC | PRN
  Administered 2021-01-03: 100 mg via INTRAVENOUS

## 2021-01-03 MED ORDER — GUAIFENESIN-DM 100-10 MG/5ML PO SYRP
15.0000 mL | ORAL_SOLUTION | ORAL | Status: DC | PRN
Start: 2021-01-03 — End: 2021-01-09
  Filled 2021-01-03: qty 15

## 2021-01-03 MED ORDER — HEPARIN 30,000 UNITS/1000 ML (OHS) CELLSAVER SOLUTION
Status: AC | PRN
  Administered 2021-01-03: 1

## 2021-01-03 MED ORDER — CEFAZOLIN SODIUM-DEXTROSE 2-4 GM/100ML-% IV SOLN
2.0000 g | Freq: Three times a day (TID) | INTRAVENOUS | Status: AC
Start: 2021-01-03 — End: 2021-01-04
  Administered 2021-01-03 – 2021-01-04 (×2): 2 g via INTRAVENOUS
  Filled 2021-01-03 (×2): qty 100

## 2021-01-03 MED ORDER — PROPOFOL 10 MG/ML IV BOLUS
INTRAVENOUS | Status: DC | PRN
  Administered 2021-01-03: 120 mg via INTRAVENOUS

## 2021-01-03 MED ORDER — SUGAMMADEX SODIUM 200 MG/2ML IV SOLN
INTRAVENOUS | Status: DC | PRN
  Administered 2021-01-03: 200 mg via INTRAVENOUS

## 2021-01-03 MED ORDER — PHENYLEPHRINE HCL (PRESSORS) 10 MG/ML IV SOLN
INTRAVENOUS | Status: AC
  Filled 2021-01-03: qty 1

## 2021-01-03 MED ORDER — VITAMIN B-12 1000 MCG PO TABS
1000.0000 ug | ORAL_TABLET | ORAL | Status: DC
  Administered 2021-01-04 – 2021-01-08 (×3): 1000 ug via ORAL
  Filled 2021-01-03 (×4): qty 1

## 2021-01-03 MED ORDER — PROPOFOL 10 MG/ML IV BOLUS
INTRAVENOUS | Status: AC
  Filled 2021-01-03: qty 20

## 2021-01-03 MED ORDER — SUGAMMADEX SODIUM 500 MG/5ML IV SOLN
INTRAVENOUS | Status: AC
  Filled 2021-01-03: qty 5

## 2021-01-03 MED ORDER — DOCUSATE SODIUM 100 MG PO CAPS
100.0000 mg | ORAL_CAPSULE | Freq: Every day | ORAL | Status: DC
  Administered 2021-01-04 – 2021-01-09 (×6): 100 mg via ORAL
  Filled 2021-01-03 (×6): qty 1

## 2021-01-03 MED ORDER — PHENYLEPHRINE HCL (PRESSORS) 10 MG/ML IV SOLN
INTRAVENOUS | Status: DC | PRN
  Administered 2021-01-03 (×5): 100 ug via INTRAVENOUS

## 2021-01-03 MED ORDER — VISTASEAL 10 ML SINGLE DOSE KIT
PACK | CUTANEOUS | Status: DC | PRN
Start: 2021-01-03 — End: 2021-01-03
  Administered 2021-01-03: 10 mL via TOPICAL

## 2021-01-03 MED ORDER — OXYCODONE-ACETAMINOPHEN 5-325 MG PO TABS
1.0000 | ORAL_TABLET | ORAL | Status: DC | PRN
  Administered 2021-01-04: 1 via ORAL
  Administered 2021-01-08: 20:00:00 2 via ORAL
  Filled 2021-01-03: qty 2
  Filled 2021-01-03: qty 1

## 2021-01-03 MED ORDER — "VISTASEAL 4 ML SINGLE DOSE KIT "
PACK | CUTANEOUS | Status: DC | PRN
  Administered 2021-01-03: 4 mL via TOPICAL

## 2021-01-03 MED ORDER — METOPROLOL TARTRATE 5 MG/5ML IV SOLN: 2.0000 mg | INTRAVENOUS | Status: DC | PRN

## 2021-01-03 MED ORDER — MAGNESIUM SULFATE 2 GM/50ML IV SOLN
2.0000 g | Freq: Every day | INTRAVENOUS | Status: AC | PRN
Start: 2021-01-03 — End: 2021-01-06
  Administered 2021-01-06: 2 g via INTRAVENOUS
  Filled 2021-01-03 (×2): qty 50

## 2021-01-03 MED ORDER — ROCURONIUM BROMIDE 10 MG/ML (PF) SYRINGE
PREFILLED_SYRINGE | INTRAVENOUS | Status: AC
  Filled 2021-01-03: qty 20

## 2021-01-03 MED ORDER — IPRATROPIUM BROMIDE 0.03 % NA SOLN
2.0000 | Freq: Three times a day (TID) | NASAL | Status: DC
  Administered 2021-01-03 – 2021-01-09 (×15): 2 via NASAL
  Filled 2021-01-03: qty 30

## 2021-01-03 MED ORDER — FLUTICASONE PROPIONATE 50 MCG/ACT NA SUSP
1.0000 | Freq: Every day | NASAL | Status: DC | PRN
  Filled 2021-01-03: qty 16

## 2021-01-03 MED ORDER — CHLORHEXIDINE GLUCONATE CLOTH 2 % EX PADS
6.0000 | MEDICATED_PAD | Freq: Every day | CUTANEOUS | Status: DC
  Administered 2021-01-03: 6 via TOPICAL

## 2021-01-03 MED ORDER — ALBUTEROL SULFATE (2.5 MG/3ML) 0.083% IN NEBU: 2.5000 mg | INHALATION_SOLUTION | Freq: Four times a day (QID) | RESPIRATORY_TRACT | Status: DC | PRN

## 2021-01-03 MED ORDER — CEFAZOLIN SODIUM-DEXTROSE 2-4 GM/100ML-% IV SOLN
INTRAVENOUS | Status: AC
  Filled 2021-01-03: qty 100

## 2021-01-03 MED ORDER — GLYCOPYRROLATE 0.2 MG/ML IJ SOLN
INTRAMUSCULAR | Status: DC | PRN
  Administered 2021-01-03: .2 mg via INTRAVENOUS

## 2021-01-03 MED ORDER — SODIUM CHLORIDE 0.9 % IV SOLN
INTRAVENOUS | Status: DC | PRN
  Administered 2021-01-03: 30 ug/min via INTRAVENOUS

## 2021-01-03 MED ORDER — MEPERIDINE HCL 25 MG/ML IJ SOLN: 6.2500 mg | INTRAMUSCULAR | Status: DC | PRN

## 2021-01-03 MED ORDER — FAMOTIDINE 20 MG PO TABS
ORAL_TABLET | ORAL | Status: AC
  Filled 2021-01-03: qty 2

## 2021-01-03 MED ORDER — METHYLPREDNISOLONE SODIUM SUCC 125 MG IJ SOLR
INTRAMUSCULAR | Status: AC
  Filled 2021-01-03: qty 2

## 2021-01-03 MED ORDER — PRASUGREL HCL 10 MG PO TABS
10.0000 mg | ORAL_TABLET | Freq: Every day | ORAL | Status: DC
Start: 2021-01-03 — End: 2021-01-09
  Administered 2021-01-03 – 2021-01-09 (×7): 10 mg via ORAL
  Filled 2021-01-03 (×7): qty 1

## 2021-01-03 MED ORDER — HYDROMORPHONE HCL 1 MG/ML IJ SOLN: 1.0000 mg | Freq: Once | INTRAMUSCULAR | Status: DC | PRN

## 2021-01-03 MED ORDER — DOPAMINE-DEXTROSE 3.2-5 MG/ML-% IV SOLN: 3.0000 ug/kg/min | INTRAVENOUS | Status: DC

## 2021-01-03 MED ORDER — FENTANYL CITRATE (PF) 100 MCG/2ML IJ SOLN: 25.0000 ug | INTRAMUSCULAR | Status: DC | PRN

## 2021-01-03 MED ORDER — NITROGLYCERIN IN D5W 200-5 MCG/ML-% IV SOLN
5.0000 ug/min | INTRAVENOUS | Status: DC
Start: 2021-01-03 — End: 2021-01-09

## 2021-01-03 MED ORDER — FENTANYL CITRATE (PF) 100 MCG/2ML IJ SOLN
INTRAMUSCULAR | Status: AC
  Filled 2021-01-03: qty 2

## 2021-01-03 MED ORDER — ASPIRIN EC 81 MG PO TBEC
81.0000 mg | DELAYED_RELEASE_TABLET | Freq: Every day | ORAL | Status: DC
  Administered 2021-01-03 – 2021-01-09 (×7): 81 mg via ORAL
  Filled 2021-01-03 (×7): qty 1

## 2021-01-03 MED ORDER — ACETAMINOPHEN 10 MG/ML IV SOLN
INTRAVENOUS | Status: DC | PRN
  Administered 2021-01-03: 1000 mg via INTRAVENOUS

## 2021-01-03 MED ORDER — SENNOSIDES-DOCUSATE SODIUM 8.6-50 MG PO TABS: 2.0000 | ORAL_TABLET | Freq: Every day | ORAL | Status: DC | PRN

## 2021-01-03 MED ORDER — OXYCODONE HCL 5 MG PO TABS
10.0000 mg | ORAL_TABLET | Freq: Four times a day (QID) | ORAL | Status: DC | PRN
Start: 2021-01-03 — End: 2021-01-04

## 2021-01-03 MED ORDER — FLUOXETINE HCL 20 MG PO CAPS
80.0000 mg | ORAL_CAPSULE | Freq: Every day | ORAL | Status: DC
  Administered 2021-01-04 – 2021-01-09 (×6): 80 mg via ORAL
  Filled 2021-01-03 (×8): qty 4

## 2021-01-03 MED ORDER — ONDANSETRON HCL 4 MG/2ML IJ SOLN
INTRAMUSCULAR | Status: DC | PRN
  Administered 2021-01-03: 4 mg via INTRAVENOUS

## 2021-01-03 MED ORDER — PROMETHAZINE HCL 25 MG/ML IJ SOLN: 6.2500 mg | INTRAMUSCULAR | Status: DC | PRN

## 2021-01-03 MED ORDER — ONDANSETRON HCL 4 MG/2ML IJ SOLN
4.0000 mg | Freq: Four times a day (QID) | INTRAMUSCULAR | Status: DC | PRN
Start: 2021-01-03 — End: 2021-01-04

## 2021-01-03 MED ORDER — ATORVASTATIN CALCIUM 20 MG PO TABS
20.0000 mg | ORAL_TABLET | Freq: Every day | ORAL | Status: DC
  Administered 2021-01-03 – 2021-01-08 (×6): 20 mg via ORAL
  Filled 2021-01-03 (×6): qty 1

## 2021-01-03 MED ORDER — ONDANSETRON HCL 4 MG/2ML IJ SOLN
4.0000 mg | Freq: Four times a day (QID) | INTRAMUSCULAR | Status: DC | PRN
  Filled 2021-01-03: qty 2

## 2021-01-03 MED ORDER — LABETALOL HCL 5 MG/ML IV SOLN
10.0000 mg | INTRAVENOUS | Status: DC | PRN
Start: 2021-01-03 — End: 2021-01-09
  Administered 2021-01-05 – 2021-01-06 (×2): 10 mg via INTRAVENOUS
  Filled 2021-01-03 (×2): qty 4

## 2021-01-03 MED ORDER — MECLIZINE HCL 25 MG PO TABS
25.0000 mg | ORAL_TABLET | Freq: Three times a day (TID) | ORAL | Status: DC | PRN
  Filled 2021-01-03: qty 1

## 2021-01-03 MED ORDER — ONDANSETRON HCL 4 MG/2ML IJ SOLN
INTRAMUSCULAR | Status: AC
  Filled 2021-01-03: qty 2

## 2021-01-03 MED ORDER — GLYCOPYRROLATE 0.2 MG/ML IJ SOLN
INTRAMUSCULAR | Status: AC
  Filled 2021-01-03: qty 1

## 2021-01-03 MED ORDER — SORBITOL 70 % SOLN
30.0000 mL | Freq: Every day | Status: DC | PRN
  Filled 2021-01-03: qty 30

## 2021-01-03 MED ORDER — HEMOSTATIC AGENTS (NO CHARGE) OPTIME
TOPICAL | Status: DC | PRN
  Administered 2021-01-03: 2 via TOPICAL

## 2021-01-03 MED ORDER — CHOLECALCIFEROL 25 MCG (1000 UT) PO TABS: 1000.0000 [IU] | ORAL_TABLET | Freq: Every day | ORAL | Status: DC

## 2021-01-03 MED ORDER — HEPARIN 5000 UNITS IN NS 1000 ML (FLUSH)
INTRAMUSCULAR | Status: DC | PRN
  Administered 2021-01-03: 200 mL via INTRAMUSCULAR

## 2021-01-03 MED ORDER — MORPHINE SULFATE (PF) 2 MG/ML IV SOLN: 2.0000 mg | INTRAVENOUS | Status: DC | PRN

## 2021-01-03 MED ORDER — ENOXAPARIN SODIUM 60 MG/0.6ML IJ SOSY: 0.5000 mg/kg | PREFILLED_SYRINGE | INTRAMUSCULAR | Status: DC

## 2021-01-03 MED ORDER — HYDRALAZINE HCL 20 MG/ML IJ SOLN
5.0000 mg | INTRAMUSCULAR | Status: DC | PRN
Start: 2021-01-03 — End: 2021-01-09

## 2021-01-03 MED ORDER — OXYCODONE HCL 5 MG PO TABS
5.0000 mg | ORAL_TABLET | Freq: Once | ORAL | Status: DC | PRN
Start: 2021-01-03 — End: 2021-01-03

## 2021-01-03 MED ORDER — ACETAMINOPHEN 325 MG PO TABS
325.0000 mg | ORAL_TABLET | ORAL | Status: DC | PRN
  Administered 2021-01-08 – 2021-01-09 (×2): 650 mg via ORAL
  Filled 2021-01-03 (×2): qty 2

## 2021-01-03 MED ORDER — POTASSIUM CHLORIDE CRYS ER 20 MEQ PO TBCR: 20.0000 meq | EXTENDED_RELEASE_TABLET | Freq: Every day | ORAL | Status: DC | PRN

## 2021-01-03 MED ORDER — PHENOL 1.4 % MT LIQD
1.0000 | OROMUCOSAL | Status: DC | PRN
  Filled 2021-01-03: qty 177

## 2021-01-03 MED ORDER — ROCURONIUM BROMIDE 100 MG/10ML IV SOLN
INTRAVENOUS | Status: DC | PRN
  Administered 2021-01-03 (×6): 20 mg via INTRAVENOUS
  Administered 2021-01-03: 10 mg via INTRAVENOUS
  Administered 2021-01-03: 50 mg via INTRAVENOUS

## 2021-01-03 MED ORDER — FENTANYL CITRATE (PF) 100 MCG/2ML IJ SOLN
INTRAMUSCULAR | Status: DC | PRN
  Administered 2021-01-03 (×5): 25 ug via INTRAVENOUS
  Administered 2021-01-03: 50 ug via INTRAVENOUS
  Administered 2021-01-03: 25 ug via INTRAVENOUS

## 2021-01-03 MED ORDER — VISTASEAL 10 ML SINGLE DOSE KIT
PACK | CUTANEOUS | Status: AC
  Filled 2021-01-03: qty 10

## 2021-01-03 MED ORDER — OXYCODONE HCL 5 MG/5ML PO SOLN
5.0000 mg | Freq: Once | ORAL | Status: DC | PRN
Start: 2021-01-03 — End: 2021-01-03

## 2021-01-03 MED ORDER — OXYBUTYNIN CHLORIDE ER 10 MG PO TB24
10.0000 mg | ORAL_TABLET | Freq: Every day | ORAL | Status: DC
  Administered 2021-01-04 – 2021-01-09 (×6): 10 mg via ORAL
  Filled 2021-01-03 (×6): qty 1

## 2021-01-03 MED ORDER — PANTOPRAZOLE SODIUM 40 MG PO TBEC
40.0000 mg | DELAYED_RELEASE_TABLET | Freq: Every day | ORAL | Status: DC
Start: 2021-01-03 — End: 2021-01-09
  Administered 2021-01-03 – 2021-01-09 (×7): 40 mg via ORAL
  Filled 2021-01-03 (×7): qty 1

## 2021-01-03 MED ORDER — ACETAMINOPHEN 10 MG/ML IV SOLN
INTRAVENOUS | Status: AC
  Filled 2021-01-03: qty 100

## 2021-01-03 MED ORDER — AMITRIPTYLINE HCL 25 MG PO TABS
25.0000 mg | ORAL_TABLET | Freq: Every day | ORAL | Status: DC
  Administered 2021-01-03 – 2021-01-08 (×6): 25 mg via ORAL
  Filled 2021-01-03 (×7): qty 1

## 2021-01-03 MED ORDER — SODIUM CHLORIDE 0.9 % IV SOLN: 500.0000 mL | Freq: Once | INTRAVENOUS | Status: DC | PRN

## 2021-01-03 MED ORDER — CHLORHEXIDINE GLUCONATE 0.12 % MT SOLN
OROMUCOSAL | Status: AC
  Filled 2021-01-03: qty 15

## 2021-01-03 MED ORDER — HEPARIN SODIUM (PORCINE) 5000 UNIT/ML IJ SOLN
INTRAMUSCULAR | Status: AC
  Filled 2021-01-03: qty 1

## 2021-01-03 MED ORDER — HEPARIN SODIUM (PORCINE) 1000 UNIT/ML IJ SOLN
INTRAMUSCULAR | Status: DC | PRN
Start: 2021-01-03 — End: 2021-01-03
  Administered 2021-01-03: 2000 [IU] via INTRAVENOUS
  Administered 2021-01-03: 5000 [IU] via INTRAVENOUS
  Administered 2021-01-03: 2000 [IU] via INTRAVENOUS

## 2021-01-03 MED ORDER — 0.9 % SODIUM CHLORIDE (POUR BTL) OPTIME
TOPICAL | Status: DC | PRN
  Administered 2021-01-03: 1000 mL

## 2021-01-03 MED ORDER — POLYETHYLENE GLYCOL 3350 17 G PO PACK
17.0000 g | PACK | Freq: Every day | ORAL | Status: DC | PRN
  Administered 2021-01-08 – 2021-01-09 (×2): 17 g via ORAL
  Filled 2021-01-03 (×2): qty 1

## 2021-01-03 MED ORDER — DEXAMETHASONE SODIUM PHOSPHATE 10 MG/ML IJ SOLN
INTRAMUSCULAR | Status: DC | PRN
  Administered 2021-01-03: 10 mg via INTRAVENOUS

## 2021-01-03 MED ORDER — FAMOTIDINE IN NACL 20-0.9 MG/50ML-% IV SOLN
20.0000 mg | Freq: Two times a day (BID) | INTRAVENOUS | Status: DC
  Administered 2021-01-03 – 2021-01-04 (×2): 20 mg via INTRAVENOUS
  Filled 2021-01-03 (×2): qty 50

## 2021-01-03 MED ORDER — VITAMIN D3 25 MCG (1000 UNIT) PO TABS
1000.0000 [IU] | ORAL_TABLET | Freq: Every day | ORAL | Status: DC
  Administered 2021-01-04 – 2021-01-09 (×6): 1000 [IU] via ORAL
  Filled 2021-01-03 (×12): qty 1

## 2021-01-03 MED ORDER — ACETAMINOPHEN 650 MG RE SUPP: 325.0000 mg | RECTAL | Status: DC | PRN

## 2021-01-03 MED ORDER — LIDOCAINE HCL (PF) 2 % IJ SOLN
INTRAMUSCULAR | Status: AC
  Filled 2021-01-03: qty 5

## 2021-01-03 MED ORDER — DEXAMETHASONE SODIUM PHOSPHATE 10 MG/ML IJ SOLN
INTRAMUSCULAR | Status: AC
  Filled 2021-01-03: qty 1

## 2021-01-03 MED ORDER — ALBUTEROL SULFATE HFA 108 (90 BASE) MCG/ACT IN AERS: 1.0000 | INHALATION_SPRAY | Freq: Four times a day (QID) | RESPIRATORY_TRACT | Status: DC | PRN

## 2021-01-03 MED ORDER — SENNOSIDES-DOCUSATE SODIUM 8.6-50 MG PO TABS: 1.0000 | ORAL_TABLET | Freq: Every evening | ORAL | Status: DC | PRN

## 2021-01-03 MED ORDER — ALUM & MAG HYDROXIDE-SIMETH 200-200-20 MG/5ML PO SUSP: 15.0000 mL | ORAL | Status: DC | PRN

## 2021-01-03 MED ORDER — ENOXAPARIN SODIUM 40 MG/0.4ML IJ SOSY
40.0000 mg | PREFILLED_SYRINGE | INTRAMUSCULAR | Status: DC
  Administered 2021-01-04 – 2021-01-08 (×4): 40 mg via SUBCUTANEOUS
  Filled 2021-01-03 (×4): qty 0.4

## 2021-01-03 MED ORDER — DIPHENHYDRAMINE HCL 50 MG/ML IJ SOLN
INTRAMUSCULAR | Status: AC
  Filled 2021-01-03: qty 1

## 2021-01-03 MED ORDER — FAMOTIDINE 20 MG PO TABS
ORAL_TABLET | ORAL | Status: AC
  Filled 2021-01-03: qty 1

## 2021-01-03 SURGICAL SUPPLY — 72 items
BAG COUNTER SPONGE SURGICOUNT (BAG) ×4 IMPLANT
BAG DECANTER FOR FLEXI CONT (MISCELLANEOUS) ×4 IMPLANT
BAG ISOLATATION DRAPE 20X20 ST (DRAPES) ×3 IMPLANT
BLADE SURG 15 STRL LF DISP TIS (BLADE) ×3 IMPLANT
BLADE SURG 15 STRL SS (BLADE) ×1
BLADE SURG SZ11 CARB STEEL (BLADE) ×4 IMPLANT
BNDG ELASTIC 6X5.8 VLCR STR LF (GAUZE/BANDAGES/DRESSINGS) ×8 IMPLANT
BOOT SUTURE AID YELLOW STND (SUTURE) ×8 IMPLANT
BRUSH SCRUB EZ  4% CHG (MISCELLANEOUS) ×1
BRUSH SCRUB EZ 4% CHG (MISCELLANEOUS) ×3 IMPLANT
CHLORAPREP W/TINT 26 (MISCELLANEOUS) ×12 IMPLANT
COVER PROBE FLX POLY STRL (MISCELLANEOUS) ×4 IMPLANT
DERMABOND ADVANCED (GAUZE/BANDAGES/DRESSINGS) ×2
DERMABOND ADVANCED .7 DNX12 (GAUZE/BANDAGES/DRESSINGS) ×6 IMPLANT
DRAPE 3/4 80X56 (DRAPES) ×4 IMPLANT
DRAPE INCISE IOBAN 66X45 STRL (DRAPES) ×4 IMPLANT
DRAPE ISOLATE BAG 20X20 STRL (DRAPES) ×1
DRSG OPSITE POSTOP 3X4 (GAUZE/BANDAGES/DRESSINGS) ×4 IMPLANT
ELECT CAUTERY BLADE 6.4 (BLADE) ×4 IMPLANT
ELECT REM PT RETURN 9FT ADLT (ELECTROSURGICAL) ×8
ELECTRODE REM PT RTRN 9FT ADLT (ELECTROSURGICAL) ×6 IMPLANT
GAUZE 4X4 16PLY ~~LOC~~+RFID DBL (SPONGE) ×4 IMPLANT
GLOVE SURG ENC MOIS LTX SZ7 (GLOVE) ×4 IMPLANT
GLOVE SURG SYN 7.0 (GLOVE) ×8 IMPLANT
GLOVE SURG UNDER LTX SZ7.5 (GLOVE) ×4 IMPLANT
GOWN STRL REUS W/ TWL LRG LVL3 (GOWN DISPOSABLE) ×9 IMPLANT
GOWN STRL REUS W/ TWL XL LVL3 (GOWN DISPOSABLE) ×6 IMPLANT
GOWN STRL REUS W/TWL LRG LVL3 (GOWN DISPOSABLE) ×3
GOWN STRL REUS W/TWL XL LVL3 (GOWN DISPOSABLE) ×2
HEAD CUTTING 'VALVULOTOME URSL (MISCELLANEOUS) ×3 IMPLANT
HEMOSTAT SURGICEL 2X3 (HEMOSTASIS) ×8 IMPLANT
IV NS 500ML (IV SOLUTION) ×1
IV NS 500ML BAXH (IV SOLUTION) ×3 IMPLANT
KIT PREVENA INCISION MGT 13 (CANNISTER) ×4 IMPLANT
KIT PREVENA INCISION MGT20CM45 (CANNISTER) ×4 IMPLANT
KIT TURNOVER KIT A (KITS) ×4 IMPLANT
LABEL OR SOLS (LABEL) ×4 IMPLANT
LOOP RED MAXI  1X406MM (MISCELLANEOUS) ×3
LOOP VESSEL MAXI 1X406 RED (MISCELLANEOUS) ×9 IMPLANT
LOOP VESSEL MINI 0.8X406 BLUE (MISCELLANEOUS) ×6 IMPLANT
LOOPS BLUE MINI 0.8X406MM (MISCELLANEOUS) ×2
MANIFOLD NEPTUNE II (INSTRUMENTS) ×4 IMPLANT
NEEDLE FILTER BLUNT 18X 1/2SAF (NEEDLE) ×1
NEEDLE FILTER BLUNT 18X1 1/2 (NEEDLE) ×3 IMPLANT
NS IRRIG 1000ML POUR BTL (IV SOLUTION) ×4 IMPLANT
PACK BASIN MAJOR ARMC (MISCELLANEOUS) ×4 IMPLANT
PACK UNIVERSAL (MISCELLANEOUS) ×4 IMPLANT
PATCH CAROTID ECM VASC 1X10 (Prosthesis & Implant Heart) ×4 IMPLANT
PENCIL ELECTRO HAND CTR (MISCELLANEOUS) ×8 IMPLANT
SET WALTER ACTIVATION W/DRAPE (SET/KITS/TRAYS/PACK) ×4 IMPLANT
SPONGE T-LAP 18X18 ~~LOC~~+RFID (SPONGE) ×8 IMPLANT
STAPLER SKIN PROX 35W (STAPLE) ×8 IMPLANT
SUT MNCRL 4-0 (SUTURE) ×1
SUT MNCRL 4-0 27XMFL (SUTURE) ×3
SUT PROLENE 5 0 RB 1 DA (SUTURE) ×56 IMPLANT
SUT PROLENE 6 0 BV (SUTURE) ×20 IMPLANT
SUT SILK 2 0 (SUTURE) ×2
SUT SILK 2-0 18XBRD TIE 12 (SUTURE) ×6 IMPLANT
SUT SILK 3 0 (SUTURE) ×2
SUT SILK 3-0 18XBRD TIE 12 (SUTURE) ×6 IMPLANT
SUT SILK 4 0 (SUTURE) ×1
SUT SILK 4-0 18XBRD TIE 12 (SUTURE) ×3 IMPLANT
SUT VIC AB 2-0 CT1 27 (SUTURE) ×4
SUT VIC AB 2-0 CT1 TAPERPNT 27 (SUTURE) ×12 IMPLANT
SUT VICRYL+ 3-0 36IN CT-1 (SUTURE) ×28 IMPLANT
SUTURE MNCRL 4-0 27XMF (SUTURE) ×3 IMPLANT
SYR 20ML LL LF (SYRINGE) ×4 IMPLANT
SYR 3ML LL SCALE MARK (SYRINGE) ×4 IMPLANT
SYR BULB IRRIG 60ML STRL (SYRINGE) ×4 IMPLANT
TOWEL OR 17X26 4PK STRL BLUE (TOWEL DISPOSABLE) ×4 IMPLANT
TRAY FOLEY MTR SLVR 16FR STAT (SET/KITS/TRAYS/PACK) ×4 IMPLANT
VALVULOTOME URESIL (MISCELLANEOUS) ×4

## 2021-01-03 NOTE — Anesthesia Procedure Notes (Signed)
Procedure Name: Intubation Date/Time: 01/03/2021 10:22 AM Performed by: Joanette Gula, Thomasene Dubow, CRNA Pre-anesthesia Checklist: Patient identified, Emergency Drugs available, Suction available and Patient being monitored Patient Re-evaluated:Patient Re-evaluated prior to induction Oxygen Delivery Method: Circle system utilized Preoxygenation: Pre-oxygenation with 100% oxygen Induction Type: IV induction Ventilation: Mask ventilation without difficulty Laryngoscope Size: McGraph and 3 Grade View: Grade I Tube type: Oral Number of attempts: 1 Airway Equipment and Method: Stylet Placement Confirmation: ETT inserted through vocal cords under direct vision, positive ETCO2 and breath sounds checked- equal and bilateral Secured at: 20 cm Tube secured with: Tape Dental Injury: Teeth and Oropharynx as per pre-operative assessment

## 2021-01-03 NOTE — Anesthesia Preprocedure Evaluation (Addendum)
Anesthesia Evaluation  Patient identified by MRN, date of birth, ID band Patient awake    Reviewed: Allergy & Precautions, NPO status , Patient's Chart, lab work & pertinent test results  History of Anesthesia Complications Negative for: history of anesthetic complications  Airway Mallampati: II  TM Distance: >3 FB Neck ROM: Full    Dental  (+) Edentulous Lower, Edentulous Upper   Pulmonary neg sleep apnea, neg COPD, Current Smoker, former smoker,    breath sounds clear to auscultation- rhonchi (-) wheezing      Cardiovascular hypertension, Pt. on medications + Peripheral Vascular Disease  (-) CAD, (-) Past MI, (-) Cardiac Stents and (-) CABG  Rhythm:Regular Rate:Normal - Systolic murmurs and - Diastolic murmurs    Neuro/Psych neg Seizures PSYCHIATRIC DISORDERS Depression TIA   GI/Hepatic negative GI ROS, Neg liver ROS,   Endo/Other  negative endocrine ROSneg diabetes  Renal/GU Renal InsufficiencyRenal disease     Musculoskeletal  (+) Arthritis ,   Abdominal (+) + obese,   Peds  Hematology  (+) anemia ,   Anesthesia Other Findings Past Medical History: No date: Anemia No date: Hypertension No date: Peripheral artery disease (HCC) No date: Renal disorder   Reproductive/Obstetrics                            Anesthesia Physical Anesthesia Plan  ASA: 3  Anesthesia Plan: General   Post-op Pain Management:    Induction: Intravenous  PONV Risk Score and Plan: 1 and Ondansetron and Dexamethasone  Airway Management Planned: Oral ETT  Additional Equipment: Arterial line  Intra-op Plan:   Post-operative Plan: Extubation in OR  Informed Consent: I have reviewed the patients History and Physical, chart, labs and discussed the procedure including the risks, benefits and alternatives for the proposed anesthesia with the patient or authorized representative who has indicated his/her  understanding and acceptance.     Dental advisory given  Plan Discussed with: CRNA and Anesthesiologist  Anesthesia Plan Comments:        Anesthesia Quick Evaluation

## 2021-01-03 NOTE — Op Note (Signed)
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 Right common femoral and profunda femoris artery endarterectomy with CorMatrix patch angioplasty. Redo right bypass surgery  CO-SURGEONS: Annice Needy, MD and Renford Dills, MD  ASSISTANT(S): Ms. Raul Del PA-C  ANESTHESIA: general  ESTIMATED BLOOD LOSS: 450 cc  FINDING(S): Dense scar tissue in the right femoral artery area.  A PTFE interposition common femoral artery graft was identified with greater than 70% stenosis proximally and greater than 90% stenosis involving the profunda femoris.  Superficial femoral artery is previously occluded  SPECIMEN(S): Plaque from the right common femoral and profunda femoris artery to pathology for permanent section  INDICATIONS:   Jody Lowe is a 75 y.o. female who presents with atherosclerotic occlusive disease of the lower extremities with a nonhealing wound of the right foot and ankle.  The patient requires surgical bypass for revascularization.  Risks and benefits were discussed including but not limited to bleeding, infection, thrombosis, limb loss, injury to nearby structures, cardiopulmonary complications, and death.  DESCRIPTION: After full informed written consent was obtained, the patient was brought back to the operating room and placed supine upon the operating table.  Prior to induction, the patient was given intravenous antibiotics.  After obtaining adequate anesthesia, the patient was prepped and draped in the standard fashion for a right femoral to popliteal bypass operation.    Co-surgeons are required because this is a complex bilateral  procedure with work being performed simultaneously from both the patient's right and left sides.  This also expedites the procedure making a shorter operative time reducing complications and improving patient safety.  A first assistant was required to provide a safe and appropriate environment for executing the surgery.  The assistant was integral in providing retraction, exposure, running suture providing suction and in the closing process.  Attention was turned to the right groin.  A longitudinal incision was made over the right common femoral artery.  Using blunt dissection and electrocautery, the artery was dissected out from the inguinal ligament down to the femoral bifurcation.  The superficial femoral artery, profunda femoral artery, and external iliac artery were dissected out and vessel loops applied.  Circumflex branches were also dissected and controlled with vessel loops.  This common femoral artery was exposed and found to actually be an interposition graft using ringed PTFE.    At this point, attention was turned to the calf.  Ultrasound was placed in a sterile sleeve and the saphenous vein was identified by ultrasound and marked with a surgical marker on the skin from the level of the femoral condyle down to the mid tibia.  A longitudinal incision was made over the saphenous vein.  Using blunt dissection and electrocautery, a plane was developed through the subcutaneous tissue to expose the saphenous vein.  Individual branches were identified as the vein was exposed and ligated and divided with 3-0 silk ties.  Once the length of the saphenous vein had been exposed and was able to be retracted posteriorly the dissection was carried deeper through the remaining subcutaneous tissues and the fascia was exposed.  Fascia was then opened and the dissection completed down to the popliteal space.  The popliteal vein was dissected out and retracted medially  and posteriorly.  The below-the-knee popliteal  artery was dissected away from the popliteal vein.  This popliteal artery was found to be a suitable target for the distal bypass anastomosis and was looped proximally and distally with red Silastic Vesseloops.      The saphenofemoral junction was then exposed through the femoral incision and skeletonized using 3-0 and 2-0 silk ties.  At this point, the patient was given 5000 units of Heparin intravenously.  After waiting three minutes, the external iliac artery, superficial femoral artery and profunda femoral artery were clamped.  An incision was made in the common femoral artery bypass graft and extended proximally and distally with a Potts scissor.  Endarterectomy was then performed with a freer elevator both proximally up to the level of the circumflex vessels and then as a separate and distinct lesion the profunda femoris from its origin distally was treated with a freer elevator.  Tacking sutures were then placed along the origin of the profunda femoris.  A total of 3 tacking sutures were performed.  CorMatrix patch was then rehydrated on the back table and trimmed and then applied as a patch angioplasty using running 5-0 Prolene.  Interrupted 5-0 Prolene's were used on the medial edge and a 10 to 12 mm gap was left at the site to receive the proximal anastomosis with the vein graft.  The right saphenous vein was taken off of the saphenofemoral junction after clamping the junction.  The junction was closed with two layers of 5-0 Prolene suture.  The proximal conduit was cut and bevelled to match the arteriotomy.  The conduit was sewn to the common femoral artery with a running stitch of 5-0 Prolene.  Prior to completing this anastomosis, all vessels were backbled.  No thrombus was noted from any vessels and backbleeding was good.  The anastomosis was completed in the usual fashion.  Flow was then reestablished first to the profunda femoris then the vein graft.  Attention was then turned to the  popliteal exposure.   We reset the exposure of the popliteal space.  The distal vein was then ligated with a 2-0 silk tie and transected with Metzenbaum scissors.  A Lamay valvulotome was then opened onto the field and passed from the distal vein proximally.  Valvulotome was verified at the saphenofemoral junction by palpation.  3 passes were made 1 using the small valvulotome and then 2 more with the mid middle valvulotome.  After this strongly pulsatile flow was noted throughout the vein.  The vein was then irrigated with heparinized saline and clamped.  We verified the popliteal artery was appropriately marked.  I determine the target segment for the anastomosis.  I applied tension to control the artery proximally and distally with vessel loops.  I made an incision with a 11-blade in the artery and extended it proximally and distally with a Potts scissor. I ligated and divided the vein distally in the calf.  The distal vein was ligated with a 2-0 silk tie. I pulled the conduit to appropriate tension and length, taking into account straightening out the leg.  I adjusted the length of the conduit sharply.  I spatulated this conduit to meet the dimensions of the arteriotomy.  The conduit was sewn to the popliteal artery with a running stitch of 6-0 Prolene.  Prior to completing this anastomosis, all vessels were backbled. No thrombus was noted from any vessels and backbleeding was good.  The bypass conduit was allowed to bleed in an antegrade fashion with  excellent pulsatile flow.  The anastomosis was completed in the usual fashion.  Ultrasound was then returned to the field and the vein graft was now scanned from the knee all the way up to the groin.  A single large branch was identified small incision was made in the medial thigh dissection carried down to identify the the branch which was then ligated with 3-0 silk ties.  At this point, all incisions were washed out and Surgicel and Vistaseal were placed  into both incisions.    At this point, bleeding in both incisions were controlled with electrocautery and suture ligature.  The calf incision was closed with interrupted deep sutures to reapproximate the deep muscles and a running stitch of 3-0 Vicryl in the subcutaneous tissue.  The skin was reapproximated with staples.  Thigh incision was closed with 3-0 Vicryl followed by surgical staples..  Attention was turned to the groin.  The groin was repaired with a double layer of 2-0 Vicryl immediately superficial to the bypass conduit.  The superficial subcutaneous tissue was reapproximated with two layers of 3-0 Vicryl.  The skin was reapproximated with surgical staples  The skin was cleaned, dried, and sterile bandages applied with an incisional VAC.   The patient was then awakened from anesthesia and taken to the recovery room in stable condition having tolerated the procedure well.     COMPLICATIONS: none  CONDITION: stable   Levora Dredge 01/03/2021 5:14 PM   This note was created with Dragon Medical transcription system. Any errors in dictation are purely unintentional.

## 2021-01-03 NOTE — Op Note (Signed)
[facility]   OPERATIVE NOTE     PRE-OPERATIVE DIAGNOSIS: Atherosclerosis with ulceration right lower extremity  POST-OPERATIVE DIAGNOSIS: Same as above  PROCEDURE: Right common femoral artery and profunda femoris artery endarterectomy with patch angioplasty Right femoral artery to below-the-knee popliteal artery bypass with in-situ saphenous vein graft Redo operation right femoral artery  SURGEON: Festus Barren, MD Levora Dredge, MD  ASSISTANT(S): Raul Del, PA-C  ANESTHESIA: general  ESTIMATED BLOOD LOSS: 450 cc  FINDING(S): Previous interposition graft in the common femoral artery with significant stenosis at the proximal edge of the previous interposition graft and the distal edge extending into the profunda femoris artery with occlusion of the superficial femoral artery Dense scar tissue in the right groin from previous surgery  SPECIMEN(S): Right common femoral artery and profunda femoris artery plaque  INDICATIONS:   Jody Lowe is a 75 y.o. female who presents with atherosclerotic occlusive disease with ulceration and nonhealing wounds on the right foot.  She had previous SFA intervention with an extensive stent fracture that precluded endovascular revascularization and she also had disease in the common femoral artery and profunda femoris artery.  The patient required surgical bypass for revascularization.  Risks and benefits were discussed including but not limited to bleeding, infection, thrombosis, limb loss, injury to nearby structures, cardiopulmonary complications, and death.  DESCRIPTION: After full informed written consent was obtained, the patient was brought back to the operating room and placed supine upon the operating table.  Prior to induction, the patient was given intravenous antibiotics.  After obtaining adequate anesthesia, the patient was prepped and draped in the standard fashion for a femoral to popliteal bypass operation.   Attention was turned to the right groin.  A longitudinal incision was made over the right common femoral artery.  Using blunt dissection and electrocautery, the artery was dissected out from the inguinal ligament down to the femoral bifurcation.  This dissection was quite tedious due to the redo nature of the operation and the extensive scar tissue seen in the right groin.  The superficial femoral artery, profunda femoral artery, and external iliac artery were dissected out and vessel loops applied.  Circumflex branches were also dissected and controlled with vessel loops.  The primary branches of the profunda femoris artery were also controlled.  This common femoral artery was replaced by an interposition PTFE graft and found on exam to be calcified and quite thickened.  There is also significant disease proximal to the bypass graft and at the distal edge of the bypass graft extending into the proximal profunda femoris artery with a known occlusion of the SFA.       At this point, attention was turned to the calf.  An longitudinal incision was made one finger-width posterior to the tibia.  Using blunt dissection and electrocautery, a plane was developed through the subcutaneous tissue and fascia down to the popliteal space.  The popliteal vein was dissected out and retracted medially and posteriorly.  The below-the-knee popliteal artery was dissected away from the popliteal vein.  This popliteal artery was found to be a suitable target for the distal bypass anastomosis.   It was quite tedious due to her very large leg.   At this point, the patient's right greater saphenous vein was dissected out for harvest for conduit.  The vein conduit was found to be adequate for bypass conduit.  Side branches of greater saphenous vein were tied off with 3-0 silk ties. We dissected down beyond the site of  planned distal anastomosis to allow it to swing over and perform an anastomosis without tension.  At the end of this  process, we felt the conduit to be adequate in quality and size for use.  At this point, the patient was given 5000 units of Heparin intravenously.  Additional heparin was given later in the procedure as needed.  After waiting three minutes, the external iliac artery, superficial femoral artery and profunda femoral artery were clamped.  An incision was made in the PTFE graft that had replaced the common femoral artery and extended proximally and distally with a Potts scissor.  This was taken down about 2 cm of the profunda femoris artery which was heavily diseased proximally.  Extensive endarterectomy was required in the proximal profunda femoris artery.  This crossed over the area of the suture line from the previous interposition graft and this was resecured with a series of interrupted 6-0 Prolene sutures.  Endarterectomy was carried proximally to arteriotomy which was in the proximal portion of the PTFE graft.  Again, a large calcified lesion at the proximal edge of the previously placed interposition graft was freed using a freer elevator and endarterectomy was performed.  Large chunk of plaque was pulled out with hemostats and a good proximal endpoint was seen.  The distal endpoint was quite feathered and clean in the profunda femoris artery as well.  We then selected a CorMatrix patch.  The patch was cut and beveled and started distally in the profunda femoris artery as this was the more difficult portion of the anastomosis.  A 5-0 Prolene was used for an anastomosis and it was running one half the length of the suture line laterally and about a quarter of the length of the suture line medially.  A second 5-0 Prolene was used after cut beveling the patch to match the arteriotomy proximally.  This 5-0 Prolene was taken down laterally and tied to the distal portion of the anastomosis.  The medial suture line was run about a quarter the length the arteriotomy and 5-0 Prolene stay sutures were secured on either  side leaving a gap for the anastomosis of the saphenous vein proximally.  The vein was taken off of the saphenofemoral junction after clamping the junction.  The junction was closed with two layers of 5-0 Prolene suture.  The proximal conduit was cut and bevelled to match the arteriotomy.  The conduit was sewn to the common femoral artery with a running stitch of 5-0 Prolene.  Prior to completing this anastomosis, all vessels were backbled.  No thrombus was noted from any vessels and backbleeding was good.  The anastomosis was completed in the usual fashion.  At this point, the saphenous vein was ligated distally in the mid calf with a 2-0 silk tie.  The valvulotome was then brought onto the field and the 2 mm, 3 mm, and 4 mm valvulotome's were passed proximally up to the proximal arterial anastomosis with brisk arterial flow seen after using the valvulotome distally.  The vein was then clamped at the proximal portion of the popliteal exposure.  Attention was then turned to the popliteal exposure.   We reset the exposure of the popliteal space.  We verified the popliteal artery was appropriately marked.  We determine the target segment for the anastomosis.  We applied tension to control the artery proximally and distally with vessel loops.  We made an incision with a 11-blade in the artery and extended it proximally and distally with a Potts scissor.  We pulled the conduit to appropriate tension and length, taking into account straightening out the leg.  We adjusted the length of the conduit sharply.  We spatulated this conduit to meet the dimensions of the arteriotomy.  The conduit was sewn to the popliteal artery with a running stitch of 6-0 Prolene.  Prior to completing this anastomosis, all vessels were backbled. No thrombus was noted from any vessels and backbleeding was good.  The bypass conduit was allowed to bleed in an antegrade fashion with excellent pulsatile flow.  The anastomosis was completed in  the usual fashion.  We then used duplex to scan the saphenous vein to evaluate for large branches.  A single large branch was seen in the mid to distal thigh and incision overlying this branch was created.  The branch was dissected out and tied off with silk tie to avoid competing flow distally.  At this point, all incisions were washed out and Vistacel and Fibrillar were placed into both incisions.    At this point, bleeding in both incisions were controlled with electrocautery and suture ligature.  The calf incision was closed with interrupted deep sutures to reapproximate the deep muscles and a running stitch of 3-0 Vicryl in the subcutaneous tissue.  The skin was reapproximated with staples.  Due to her large deep calf an incisional VAC was used in this location.  Attention was turned to the groin.  The groin was repaired with a double layer of 2-0 Vicryl immediately superficial to the bypass conduit.  The superficial subcutaneous tissue was reapproximated with two layers of 3-0 Vicryl.  The skin was reapproximated with staples.  Due to the redo nature of the right groin incision, an incisional VAC was placed in this location.  The vein harvest incisions were closed with a layer of 3-0 Vicryl in the subcutaneous tissue.  The skin was then reapproximated with staples.  The skin was cleaned, dried, and sterile bandages applied.   The patient was then awakened from anesthesia and taken to the recovery room in stable condition having tolerated the procedure well.     COMPLICATIONS: none  CONDITION: stable   Festus Barren 01/03/2021 4:42 PM   This note was created with Dragon Medical transcription system. Any errors in dictation are purely unintentional.

## 2021-01-03 NOTE — Progress Notes (Deleted)
PHARMACIST - PHYSICIAN COMMUNICATION  CONCERNING:  Enoxaparin (Lovenox) for DVT Prophylaxis    RECOMMENDATION: Patient was prescribed enoxaprin 40mg  q24 hours for VTE prophylaxis.   Filed Weights   01/03/21 0750 01/03/21 1745  Weight: 93.4 kg (206 lb) 96.3 kg (212 lb 4.9 oz)    Body mass index is 34.27 kg/m.  Estimated Creatinine Clearance: 52.8 mL/min (A) (by C-G formula based on SCr of 1.06 mg/dL (H)).   Based on [facility] policy patient is candidate for enoxaparin 0.5mg /kg TBW SQ every 24 hours based on BMI being >30.  DESCRIPTION: Pharmacy has adjusted enoxaparin dose per [facility] policy.  Patient is now receiving enoxaparin 47.5 mg every 24 hours    [facility], PharmD Clinical Pharmacist  01/03/2021 7:45 PM

## 2021-01-03 NOTE — Interval H&P Note (Signed)
History and Physical Interval Note:  01/03/2021 8:15 AM  Jody Lowe  has presented today for surgery, with the diagnosis of PVD.  The various methods of treatment have been discussed with the patient and family. After consideration of risks, benefits and other options for treatment, the patient has consented to  Procedure(s): APPLICATION OF CELL SAVER (N/A) BYPASS GRAFT FEMORAL-TIBIAL ARTERY (N/A) as a surgical intervention.  The patient's history has been reviewed, patient examined, no change in status, stable for surgery.  I have reviewed the patient's chart and labs.  Questions were answered to the patient's satisfaction.     Festus Barren

## 2021-01-03 NOTE — Transfer of Care (Signed)
Immediate Anesthesia Transfer of Care Note  Patient: Jody Lowe  Procedure(s) Performed: APPLICATION OF CELL SAVER BYPASS GRAFT FEMORAL-TIBIAL ARTERY (Right) VEIN HARVEST (Right: Leg Lower)  Patient Location: PACU  Anesthesia Type:General  Level of Consciousness: drowsy  Airway & Oxygen Therapy: Patient Spontanous Breathing and Patient connected to face mask oxygen  Post-op Assessment: Report given to RN and Post -op Vital signs reviewed and stable  Post vital signs: Reviewed and stable  Last Vitals:  Vitals Value Taken Time  BP 145/64   Temp    Pulse 101   Resp 16   SpO2 100     Last Pain:  Vitals:   01/03/21 0750  TempSrc: Oral  PainSc: 0-No pain         Complications: No notable events documented.

## 2021-01-04 ENCOUNTER — Encounter: Payer: Self-pay | Admitting: Vascular Surgery

## 2021-01-04 DIAGNOSIS — L97519 Non-pressure chronic ulcer of other part of right foot with unspecified severity: Secondary | ICD-10-CM

## 2021-01-04 DIAGNOSIS — I70235 Atherosclerosis of native arteries of right leg with ulceration of other part of foot: Principal | ICD-10-CM

## 2021-01-04 LAB — CBC
HCT: 22.7 % — ABNORMAL LOW (ref 36.0–46.0)
HCT: 24 % — ABNORMAL LOW (ref 36.0–46.0)
Hemoglobin: 7 g/dL — ABNORMAL LOW (ref 12.0–15.0)
Hemoglobin: 7.3 g/dL — ABNORMAL LOW (ref 12.0–15.0)
MCH: 25.2 pg — ABNORMAL LOW (ref 26.0–34.0)
MCH: 26.5 pg (ref 26.0–34.0)
MCHC: 30.4 g/dL (ref 30.0–36.0)
MCHC: 30.8 g/dL (ref 30.0–36.0)
MCV: 82.8 fL (ref 80.0–100.0)
MCV: 86 fL (ref 80.0–100.0)
Platelets: 428 10*3/uL — ABNORMAL HIGH (ref 150–400)
Platelets: 522 10*3/uL — ABNORMAL HIGH (ref 150–400)
RBC: 2.64 MIL/uL — ABNORMAL LOW (ref 3.87–5.11)
RBC: 2.9 MIL/uL — ABNORMAL LOW (ref 3.87–5.11)
RDW: 16.5 % — ABNORMAL HIGH (ref 11.5–15.5)
RDW: 17.1 % — ABNORMAL HIGH (ref 11.5–15.5)
WBC: 14.5 10*3/uL — ABNORMAL HIGH (ref 4.0–10.5)
WBC: 24.9 10*3/uL — ABNORMAL HIGH (ref 4.0–10.5)
nRBC: 0 % (ref 0.0–0.2)
nRBC: 0.1 % (ref 0.0–0.2)

## 2021-01-04 LAB — BASIC METABOLIC PANEL
Anion gap: 7 (ref 5–15)
BUN: 12 mg/dL (ref 8–23)
CO2: 22 mmol/L (ref 22–32)
Calcium: 8.4 mg/dL — ABNORMAL LOW (ref 8.9–10.3)
Chloride: 101 mmol/L (ref 98–111)
Creatinine, Ser: 1.14 mg/dL — ABNORMAL HIGH (ref 0.44–1.00)
GFR, Estimated: 50 mL/min — ABNORMAL LOW (ref >60)
Glucose, Bld: 166 mg/dL — ABNORMAL HIGH (ref 70–99)
Potassium: 4.5 mmol/L (ref 3.5–5.1)
Sodium: 130 mmol/L — ABNORMAL LOW (ref 135–145)

## 2021-01-04 MED ORDER — FAMOTIDINE 20 MG PO TABS
20.0000 mg | ORAL_TABLET | Freq: Two times a day (BID) | ORAL | Status: DC
  Administered 2021-01-04 – 2021-01-09 (×10): 20 mg via ORAL
  Filled 2021-01-04 (×10): qty 1

## 2021-01-04 NOTE — Anesthesia Postprocedure Evaluation (Signed)
Anesthesia Post Note  Patient: Jody Lowe  Procedure(s) Performed: APPLICATION OF CELL SAVER BYPASS GRAFT FEMORAL-TIBIAL ARTERY (Right) VEIN HARVEST (Right: Leg Lower)  Patient location during evaluation: Nursing Unit Anesthesia Type: General Level of consciousness: oriented and awake and alert Pain management: pain level controlled Vital Signs Assessment: post-procedure vital signs reviewed and stable Respiratory status: spontaneous breathing and respiratory function stable Cardiovascular status: blood pressure returned to baseline and stable Postop Assessment: no headache, no backache, no apparent nausea or vomiting and patient able to bend at knees Anesthetic complications: no   No notable events documented.   Last Vitals:  Vitals:   01/04/21 0800 01/04/21 0900  BP: (!) 123/49 (!) 144/56  Pulse: 93 93  Resp: 15 15  Temp:    SpO2: 94% 97%    Last Pain:  Vitals:   01/04/21 0700  TempSrc:   PainSc: 0-No pain                 Starling Manns

## 2021-01-04 NOTE — Progress Notes (Signed)
[facility] Daily Progress Note  01/03/21: Right common femoral artery and profunda femoris artery endarterectomy with patch angioplasty. Right femoral artery to below-the-knee popliteal artery bypass with in-situ saphenous vein graft. Redo operation right femoral artery.  Subjective: Patient without complaint this AM. States no discomfort at this time. No acute issues overnight.  Objective: Vitals:   01/04/21 0800 01/04/21 0900 01/04/21 1000 01/04/21 1100  BP: (!) 123/49 (!) 144/56 (!) 125/57 (!) 124/108  Pulse: 93 93 (!) 107   Resp: 15 15 20 19   Temp:      TempSrc:      SpO2: 94% 97% 97% 95%  Weight:      Height:        Intake/Output Summary (Last 24 hours) at 01/04/2021 1110 Last data filed at 01/04/2021 0900 Gross per 24 hour  Intake 3450.85 ml  Output 1300 ml  Net 2150.85 ml   Physical Exam: A&Ox3, NAD CV: RRR Pulmonary: CTA Bilaterally Abdomen: Soft, Non-tender, Non-distended, (+) Bowel sounds GU: Foley intact.  Draining clear urine. Vascular:   Right lower extremity: Thigh soft.  Calf soft.  Foot is warm.  Our dressings intact clean and dry.  Compression wrap still intact.  Motor/sensory is intact.   Laboratory: CBC    Component Value Date/Time   WBC 24.9 (H) 01/03/2021 2356   HGB 7.3 (L) 01/03/2021 2356   HCT 24.0 (L) 01/03/2021 2356   PLT 522 (H) 01/03/2021 2356   BMET    Component Value Date/Time   NA 130 (L) 01/03/2021 2356   K 4.5 01/03/2021 2356   CL 101 01/03/2021 2356   CO2 22 01/03/2021 2356   GLUCOSE 166 (H) 01/03/2021 2356   BUN 12 01/03/2021 2356   CREATININE 1.14 (H) 01/03/2021 2356   CALCIUM 8.4 (L) 01/03/2021 2356   GFRNONAA 50 (L) 01/03/2021 2356   GFRAA >60 09/20/2019 1312   Assessment/Planning: The patient is a 75 year old female s/p right common femoral artery endarterectomy with right femoral to below the knee popliteal artery bypass - POD#1  1) on today's physical exam, the patients bypass is patent and  palpable.  There is no acute compromise noted to the right lower extremity at this time.  Foot is warm.  Extremity is soft. 2) due to the patient's large surgery yesterday we will leave the Foley intact 1 more day for strict I's and O's as well as patient comfort 3) little over a gram drop in hemoglobin today.  Will order a CBC for approximately 2 PM.  If hemoglobin should drop below 7 will transfuse as needed.  Patient is currently asymptomatic. 4) leukocytosis.  AM CBC. 5) advance diet 6) Lovenox for DVT prophylaxis 7) on aspirin, statin and Effient 8) okay to work with PT and OT  Discussed with Dr. 77 Benigna Delisi PA-C 01/04/2021 11:10 AM

## 2021-01-04 NOTE — Evaluation (Signed)
Physical Therapy Evaluation Patient Details Name: Jody Lowe MRN: [id number] DOB: 04-Feb-1946 Today's Date: 01/04/2021  History of Present Illness  Pt is 75 y/o female s/p application of cell saver and s/p bypass graft-femorial-tibial artery.  Clinical Impression  Pt received supine in bed, agreeable to therapy. Pt very pleasant and cooperative throughout, however she did politely decline attempt to STS after attempting with OT earlier in the morning. Pt demo decreased strength in BLE, pain in RLE and decreased knee flexion in B knees - passive and active. Pt rolled in each direction, pain with R hip flexion to roll towards the left. Supine<>sit was performed with CGA-MIN A, PT occasionally assisting RLE. Pt remained sitting EOB for about 3 minutes before reporting fatigue and asking to return to supine. PT rec d/c to SNF due to limited mobility compared to baseline and OOB mobility not observed today. Would benefit from skilled PT to address above deficits and promote optimal return to PLOF.      Recommendations for follow up therapy are one component of a multi-disciplinary discharge planning process, led by the attending physician.  Recommendations may be updated based on patient status, additional functional criteria and insurance authorization.  Follow Up Recommendations SNF;Supervision/Assistance - 24 hour    Equipment Recommendations  None recommended by PT    Recommendations for Other Services       Precautions / Restrictions Precautions Precautions: Fall Restrictions Weight Bearing Restrictions: No      Mobility  Bed Mobility Overal bed mobility: Needs Assistance Bed Mobility: Supine to Sit;Sit to Supine;Rolling Rolling: Mod assist   Supine to sit: Mod assist Sit to supine: Max assist   General bed mobility comments: Improved rolling R compared to L using bed rails - MOD A to complete roll of the hips. CGA-MIN A for sup<>sup for occasioanl RLE management. Pt  managed trunk however did need assist repositioning once supine.    Transfers Overall transfer level: Needs assistance Equipment used: None;Rolling walker (2 wheeled) Transfers: Sit to/from Stand Sit to Stand: Total assist         General transfer comment: deferred - pt declined to stand due to pain it caused with OT earlier in the morning  Ambulation/Gait                Stairs            Wheelchair Mobility    Modified Rankin (Stroke Patients Only)       Balance Overall balance assessment: Needs assistance Sitting-balance support: Feet supported;Bilateral upper extremity supported Sitting balance-Leahy Scale: Fair Sitting balance - Comments: poor muscular endurance - pt reporting fatigue after 3 minutes Postural control: Posterior lean Standing balance support: During functional activity Standing balance-Leahy Scale: Zero                               Pertinent Vitals/Pain Pain Assessment: 0-10 Pain Score: 5  Pain Location: R LE Pain Descriptors / Indicators: Discomfort;Grimacing;Guarding Pain Intervention(s): Limited activity within patient's tolerance;Monitored during session;Premedicated before session;Repositioned    Home Living Family/patient expects to be discharged to:: Skilled nursing facility Living Arrangements: Alone               Additional Comments: She as a son and sister that can assist her at discharge.    Prior Function Level of Independence: Independent with assistive device(s)         Comments: Mod indep with 3WRW for ADLs and  household mobilization; endorses very limited Energy manager, Software engineer or facility [facility] when going to/from appointments.  Completes most bathing routines via sponge bathing.  Received Meals on Wheels and son provides assist with groceries, community needs. Reports "too many falls to count" when asked about the last 6 months.     Hand Dominance   Dominant Hand: Right     Extremity/Trunk Assessment   Upper Extremity Assessment Upper Extremity Assessment: Overall WFL for tasks assessed    Lower Extremity Assessment Lower Extremity Assessment: Generalized weakness (3-4/5 LLE strength - R deferred due to pain s/p graft)       Communication   Communication: No difficulties  Cognition Arousal/Alertness: Awake/alert Behavior During Therapy: WFL for tasks assessed/performed Overall Cognitive Status: Within Functional Limits for tasks assessed                                 General Comments: A&Ox4      General Comments      Exercises     Assessment/Plan    PT Assessment Patient needs continued PT services  PT Problem List Decreased strength;Decreased mobility;Decreased safety awareness;Decreased range of motion;Decreased activity tolerance;Decreased balance;Pain       PT Treatment Interventions DME instruction;Therapeutic activities;Gait training;Therapeutic exercise;Patient/family education;Balance training;Functional mobility training;Neuromuscular re-education    PT Goals (Current goals can be found in the Care Plan section)  Acute Rehab PT Goals Patient Stated Goal: to go home PT Goal Formulation: With patient Time For Goal Achievement: 01/18/21 Potential to Achieve Goals: Fair    Frequency Min 2X/week   Barriers to discharge Decreased caregiver support      Co-evaluation               AM-PAC PT "6 Clicks" Mobility  Outcome Measure Help needed turning from your back to your side while in a flat bed without using bedrails?: A Little Help needed moving from lying on your back to sitting on the side of a flat bed without using bedrails?: A Little Help needed moving to and from a bed to a chair (including a wheelchair)?: A Lot Help needed standing up from a chair using your arms (e.g., wheelchair or bedside chair)?: A Lot Help needed to walk in hospital room?: A Lot Help needed climbing 3-5 steps with a railing?  : Total 6 Click Score: 13    End of Session   Activity Tolerance: Patient limited by pain;Patient limited by fatigue Patient left: in bed;with call bell/phone within reach Nurse Communication: Mobility status;Precautions PT Visit Diagnosis: Unsteadiness on feet (R26.81);Other abnormalities of gait and mobility (R26.89);Muscle weakness (generalized) (M62.81);History of falling (Z91.81);Difficulty in walking, not elsewhere classified (R26.2)    Time: 9381-8299 PT Time Calculation (min) (ACUTE ONLY): 31 min   Charges:   PT Evaluation $PT Eval Moderate Complexity: 1 Mod PT Treatments $Therapeutic Activity: 23-37 mins        Basilia Jumbo PT, DPT 01/04/21 1:43 PM [phone]

## 2021-01-04 NOTE — Care Management Important Message (Signed)
Important Message  Patient Details  Name: Jody Lowe MRN: [id number] Date of Birth: Sep 08, 1945   Medicare Important Message Given:  N/A - LOS <3 / Initial given by admissions     [name] 01/04/2021, 5:07 PM

## 2021-01-04 NOTE — Plan of Care (Signed)
  Problem: Clinical Measurements: Goal: Will remain free from infection Outcome: Progressing Goal: Cardiovascular complication will be avoided Outcome: Progressing   Problem: Activity: Goal: Risk for activity intolerance will decrease Outcome: Progressing   Problem: Pain Managment: Goal: General experience of comfort will improve Outcome: Progressing   Problem: Safety: Goal: Ability to remain free from injury will improve Outcome: Progressing   

## 2021-01-04 NOTE — Evaluation (Signed)
Occupational Therapy Evaluation Patient Details Name: MAYARI MATUS MRN: [id number] DOB: May 04, 1945 Today's Date: 01/04/2021   History of Present Illness Pt is 75 y/o female s/p application of cell saver and s/p bypass graft-femorial-tibial artery.   Clinical Impression   Patient presenting with decreased Ind in self care, balance functional mobility/transfers, and endurance. Patient reporting living at home alone PTA. She ambulates short distances no greater than 20' at baseline with use of AD. She does endorse family assisting with some IADL tasks as needed. Patient needing mod - max A for bed mobility. Pt unable to stand with use of RW and clears buttocks from bed with total A from therapist. Pt unable to take steps safely and is up for ~10 seconds before returning to bed. Pt does report increased pain in LEs of 7/10 pain and RN notified. Pt's assistance at her home is her sister and son (she reports he has a "bad back") and they likely are unable to provide this level of care at discharge. Patient will benefit from acute OT to increase overall independence in the areas of ADLs, functional mobility, and safety awareness in order to safely discharge to next venue of care.     Recommendations for follow up therapy are one component of a multi-disciplinary discharge planning process, led by the attending physician.  Recommendations may be updated based on patient status, additional functional criteria and insurance authorization.   Follow Up Recommendations  SNF    Equipment Recommendations  Other (comment) (defer to next venue of care)       Precautions / Restrictions Precautions Precautions: Fall Restrictions Weight Bearing Restrictions: No      Mobility Bed Mobility Overal bed mobility: Needs Assistance Bed Mobility: Supine to Sit;Sit to Supine     Supine to sit: Mod assist Sit to supine: Max assist   General bed mobility comments: assist for  BLEs and trunk as well when  returning to bed    Transfers Overall transfer level: Needs assistance Equipment used: None;Rolling walker (2 wheeled) Transfers: Sit to/from Stand Sit to Stand: Total assist         General transfer comment: OT attempted stand with RW and pt unable. Second attempt with therapist forward facing and total A to clear buttocks from bed. unable to take steps.    Balance Overall balance assessment: Needs assistance Sitting-balance support: Feet supported;Bilateral upper extremity supported Sitting balance-Leahy Scale: Fair     Standing balance support: During functional activity Standing balance-Leahy Scale: Zero                             ADL either performed or assessed with clinical judgement   ADL Overall ADL's : Needs assistance/impaired     Grooming: Wash/dry hands;Wash/dry face;Set up;Sitting               Lower Body Dressing: Total assistance     Toilet Transfer Details (indicate cue type and reason): unable to attempt secondary to safety and increased pain                 Vision Patient Visual Report: No change from baseline       Perception     Praxis      Pertinent Vitals/Pain Pain Assessment: 0-10 Pain Score: 7  Pain Location: R LE Pain Descriptors / Indicators: Discomfort;Grimacing;Guarding Pain Intervention(s): Limited activity within patient's tolerance;Monitored during session;Repositioned;Patient requesting pain meds-RN notified     Hand Dominance Right  Extremity/Trunk Assessment Upper Extremity Assessment Upper Extremity Assessment: Overall WFL for tasks assessed;Generalized weakness   Lower Extremity Assessment Lower Extremity Assessment: Defer to PT evaluation       Communication Communication Communication: No difficulties   Cognition Arousal/Alertness: Awake/alert Behavior During Therapy: WFL for tasks assessed/performed Overall Cognitive Status: Within Functional Limits for tasks assessed                                                 Home Living Family/patient expects to be discharged to:: Skilled nursing facility Living Arrangements: Alone                               Additional Comments: She as a son and sister that can assist her at discharge.      Prior Functioning/Environment Level of Independence: Independent with assistive device(s)        Comments: Mod indep with 3WRW for ADLs, household mobilization; endorses very limited community mobilization, often using facility [facility] when going to/from appointments.  Completes most bathing routines via sponge bathing.  Received Meals on Wheels and son provides assist with groceries, community needs.        OT Problem List: Decreased strength;Decreased activity tolerance;Impaired balance (sitting and/or standing);Decreased safety awareness;Pain      OT Treatment/Interventions: Self-care/ADL training;Manual therapy;Therapeutic exercise;Patient/family education;Balance training;Energy conservation;Therapeutic activities;DME and/or AE instruction    OT Goals(Current goals can be found in the care plan section) Acute Rehab OT Goals Patient Stated Goal: to go home OT Goal Formulation: With patient Time For Goal Achievement: 01/18/21 Potential to Achieve Goals: Fair ADL Goals Pt Will Perform Lower Body Dressing: with min assist;sit to/from stand Pt Will Transfer to Toilet: with min assist;ambulating Pt Will Perform Toileting - Clothing Manipulation and hygiene: with min assist;sit to/from stand  OT Frequency: Min 2X/week   Barriers to D/C:    caregivers unable to provide much physcial assistance per pt          AM-PAC OT "6 Clicks" Daily Activity     Outcome Measure Help from another person eating meals?: None Help from another person taking care of personal grooming?: A Little Help from another person toileting, which includes using toliet, bedpan, or urinal?: Total Help from another person  bathing (including washing, rinsing, drying)?: A Lot Help from another person to put on and taking off regular upper body clothing?: A Little Help from another person to put on and taking off regular lower body clothing?: Total 6 Click Score: 14   End of Session Equipment Utilized During Treatment: Rolling walker Nurse Communication: Mobility status;Patient requests pain meds  Activity Tolerance: Patient limited by pain Patient left: in bed;with call bell/phone within reach;with bed alarm set  OT Visit Diagnosis: Unsteadiness on feet (R26.81);Muscle weakness (generalized) (M62.81);Repeated falls (R29.6);Pain Pain - Right/Left: Right Pain - part of body: Leg                Time: 1015-1040 OT Time Calculation (min): 25 min Charges:  OT General Charges $OT Visit: 1 Visit OT Evaluation $OT Eval Moderate Complexity: 1 Mod OT Treatments $Therapeutic Activity: 8-22 mins  Jackquline Denmark, MS, OTR/L , CBIS ascom [phone]  01/04/21, 11:18 AM

## 2021-01-04 NOTE — Progress Notes (Signed)
Per Dr Wyn Quaker will recheck hemoglobin in the am.

## 2021-01-04 NOTE — Progress Notes (Signed)
PHARMACIST - PHYSICIAN COMMUNICATION  CONCERNING: IV to Oral Route Change Policy  RECOMMENDATION: This patient is receiving famotidine by the intravenous route.  Based on criteria approved by the Pharmacy and Therapeutics Committee, the intravenous medication(s) is/are being converted to the equivalent oral dose form(s).   DESCRIPTION: These criteria include: The patient is eating (either orally or via tube) and/or has been taking other orally administered medications for a least 24 hours The patient has no evidence of active gastrointestinal bleeding or impaired GI absorption (gastrectomy, short bowel, patient on TNA or NPO).  If you have questions about this conversion, please contact the Pharmacy Department   Tressie Ellis, [facility] 01/04/2021 11:27 AM

## 2021-01-04 NOTE — Progress Notes (Signed)
Patient alert and oriented. On room air. No complaints of pain until patient up with PT and moving around. Pain medication given and leg pain back to a zero. Dressings intact, wound vac groin and leg intact. No drainage noted throughout the shift. Pedal pulses dopplered. Foley removed. Patient hemoglobin down to 7.0. Secure chat sent to Dr. Wyn Quaker and Elmyra Ricks. PA. Waiting for response. Patient being transferred to floor. Continue to assess.

## 2021-01-05 LAB — CBC
HCT: 22.3 % — ABNORMAL LOW (ref 36.0–46.0)
HCT: 23.3 % — ABNORMAL LOW (ref 36.0–46.0)
Hemoglobin: 6.7 g/dL — ABNORMAL LOW (ref 12.0–15.0)
Hemoglobin: 7.7 g/dL — ABNORMAL LOW (ref 12.0–15.0)
MCH: 25.2 pg — ABNORMAL LOW (ref 26.0–34.0)
MCH: 26.8 pg (ref 26.0–34.0)
MCHC: 30 g/dL (ref 30.0–36.0)
MCHC: 33 g/dL (ref 30.0–36.0)
MCV: 83.8 fL (ref 80.0–100.0)
MCV: 81.2 fL (ref 80.0–100.0)
Platelets: 444 10*3/uL — ABNORMAL HIGH (ref 150–400)
Platelets: 433 10*3/uL — ABNORMAL HIGH (ref 150–400)
RBC: 2.66 MIL/uL — ABNORMAL LOW (ref 3.87–5.11)
RBC: 2.87 MIL/uL — ABNORMAL LOW (ref 3.87–5.11)
RDW: 17.2 % — ABNORMAL HIGH (ref 11.5–15.5)
RDW: 16 % — ABNORMAL HIGH (ref 11.5–15.5)
WBC: 12.5 10*3/uL — ABNORMAL HIGH (ref 4.0–10.5)
WBC: 14.4 10*3/uL — ABNORMAL HIGH (ref 4.0–10.5)
nRBC: 0 % (ref 0.0–0.2)
nRBC: 0 % (ref 0.0–0.2)

## 2021-01-05 LAB — MAGNESIUM: Magnesium: 1.8 mg/dL (ref 1.7–2.4)

## 2021-01-05 LAB — BASIC METABOLIC PANEL
Anion gap: 7 (ref 5–15)
BUN: 16 mg/dL (ref 8–23)
CO2: 22 mmol/L (ref 22–32)
Calcium: 8.3 mg/dL — ABNORMAL LOW (ref 8.9–10.3)
Chloride: 103 mmol/L (ref 98–111)
Creatinine, Ser: 1.2 mg/dL — ABNORMAL HIGH (ref 0.44–1.00)
GFR, Estimated: 47 mL/min — ABNORMAL LOW (ref >60)
Glucose, Bld: 115 mg/dL — ABNORMAL HIGH (ref 70–99)
Potassium: 4.1 mmol/L (ref 3.5–5.1)
Sodium: 132 mmol/L — ABNORMAL LOW (ref 135–145)

## 2021-01-05 LAB — PREPARE RBC (CROSSMATCH)

## 2021-01-05 LAB — SURGICAL PATHOLOGY

## 2021-01-05 MED ORDER — FUROSEMIDE 10 MG/ML IJ SOLN
20.0000 mg | Freq: Once | INTRAMUSCULAR | Status: AC
  Administered 2021-01-05: 20 mg via INTRAVENOUS
  Filled 2021-01-05: qty 2

## 2021-01-05 MED ORDER — DIPHENHYDRAMINE HCL 50 MG/ML IJ SOLN
25.0000 mg | Freq: Once | INTRAMUSCULAR | Status: AC
  Administered 2021-01-05: 25 mg via INTRAVENOUS
  Filled 2021-01-05: qty 1

## 2021-01-05 MED ORDER — ACETAMINOPHEN 325 MG PO TABS
650.0000 mg | ORAL_TABLET | Freq: Once | ORAL | Status: AC
  Administered 2021-01-05: 650 mg via ORAL
  Filled 2021-01-05: qty 2

## 2021-01-05 MED ORDER — SODIUM CHLORIDE 0.9% IV SOLUTION: Freq: Once | INTRAVENOUS | Status: AC

## 2021-01-05 NOTE — Progress Notes (Signed)
OT Cancellation Note  Patient Details Name: Jody Lowe MRN: [id number] DOB: 1945/07/02   Cancelled Treatment:    Reason Eval/Treat Not Completed: Medical issues which prohibited therapy. Pt's hemoglobin is 6.7 which is contraindicated for therapeutic intervention. OT to hold and re-attempt when pt is medically able to participate.   Jackquline Denmark, MS, OTR/L , CBIS ascom [phone]  01/05/21, 9:46 AM

## 2021-01-05 NOTE — Progress Notes (Signed)
[facility] Daily Progress Note  01/03/21: Right common femoral artery and profunda femoris artery endarterectomy with patch angioplasty. Right femoral artery to below-the-knee popliteal artery bypass with in-situ saphenous vein graft. Redo operation right femoral artery.  Subjective: Patient without complaint this AM.  Discomfort seems to still be controlled.  No acute issues overnight.  Objective: Vitals:   01/04/21 1841 01/04/21 2125 01/05/21 0338 01/05/21 0739  BP: (!) 123/58 (!) 117/53 (!) 151/62 (!) 146/59  Pulse: 90 95 (!) 106 (!) 109  Resp: 18 18 20 18   Temp: 98.8 F (37.1 C) 99.5 F (37.5 C) 98.5 F (36.9 C) 98.4 F (36.9 C)  TempSrc:  Oral Oral Oral  SpO2: 100% 99% 100% 96%  Weight:      Height:        Intake/Output Summary (Last 24 hours) at 01/05/2021 1013 Last data filed at 01/05/2021 1010 Gross per 24 hour  Intake 300 ml  Output 1025 ml  Net -725 ml   Physical Exam: A&Ox3, NAD CV: RRR Pulmonary: CTA Bilaterally Abdomen: Soft, Nontender, Nondistended Vascular:              Right lower extremity: Thigh soft.  Calf soft.  Foot is warm.  Our dressings intact clean and dry.  Compression wrap still intact.  Motor/sensory is intact.  Motor/sensory is intact.   Laboratory: CBC    Component Value Date/Time   WBC 12.5 (H) 01/05/2021 0508   HGB 6.7 (L) 01/05/2021 0508   HCT 22.3 (L) 01/05/2021 0508   PLT 444 (H) 01/05/2021 0508   BMET    Component Value Date/Time   NA 132 (L) 01/05/2021 0508   K 4.1 01/05/2021 0508   CL 103 01/05/2021 0508   CO2 22 01/05/2021 0508   GLUCOSE 115 (H) 01/05/2021 0508   BUN 16 01/05/2021 0508   CREATININE 1.20 (H) 01/05/2021 0508   CALCIUM 8.3 (L) 01/05/2021 0508   GFRNONAA 47 (L) 01/05/2021 0508   GFRAA >60 09/20/2019 1312   Assessment/Planning: The patient is a 75 year old female s/p right common femoral artery endarterectomy with right femoral to below the knee popliteal artery bypass - POD#2    1) on today's physical exam, the patients bypass is patent and palpable.  There is no acute compromise noted to the right lower extremity at this time.  Foot is warm.  Extremity is soft. 2) Foley removed postop day 1.  Patient passed trial of void.  Making good urine. 3) hemoglobin is 6.8 this AM.  Will transfuse 1 unit packed red blood cells.  Follow-up posttransfusion CBC 4) leukocytosis improving.  AM CBC. 5) tolerating regular diet 6) Lovenox for DVT prophylaxis 7) on aspirin, statin and Effient 8) okay to work with PT and OT   Discussed with Dr. 73 Taydem Cavagnaro PA-C 01/05/2021 10:13 AM

## 2021-01-05 NOTE — [place] FL2 (Addendum)
[place] MEDICA[place] FL2 LEVEL OF CARE SCREENING TOOL     IDENTIFICATION  Patient Name: Jody Lowe Birthdate: 09-14-1945 Sex: female Admission Date (Current Location): 01/03/2021  [facility] [place] Number:  Chiropodist and Address:  [facility], [address], [place], [place] [address]      Provider Number: [id number]  Attending Physician Name and Address:  Annice Needy, MD  Relative Name and Phone Number:  Mamta Rimmer (son) [phone]    Current Level of Care: Hospital Recommended Level of Care: Skilled Nursing Facility Prior Approval Number:    Date Approved/Denied:   PASRR Number:    Discharge Plan: SNF    Current Diagnoses: Patient Active Problem List   Diagnosis Date Noted   Atherosclerosis of artery of extremity with ulceration (HCC) 01/03/2021   Shortness of breath 12/07/2020   Critical lower limb ischemia (HCC) 12/07/2020   Generalized weakness 10/20/2020   Weakness    Syncope 10/16/2020   Drop in hemoglobin    B12 deficiency    Elevated troponin    Pain in both lower extremities    Chronic pain of both knees    Syncope and collapse 10/14/2020   Anemia 10/14/2020   AKI (acute kidney injury) (HCC) 10/14/2020   Claudication in peripheral vascular disease (HCC) 09/04/2020   Falls frequently 09/04/2020   Stage 3a chronic kidney disease (HCC) 02/29/2020   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   TIA (transient ischemic attack) 05/01/2019   Hyperlipidemia 06/23/2017   Nasal congestion 05/08/2017   Tinnitus 05/08/2017   Back pain 04/21/2017   Shoulder pain 04/21/2017   Carpal tunnel syndrome 08/23/2016   Age-related osteoporosis without current pathological fracture 06/27/2016   Intention tremor 06/27/2016   Risk for falls 02/20/2016   Tobacco use disorder 05/09/2015   Dizziness 10/19/2014   Subclavian steal syndrome 10/19/2014   Allergic rhinitis 12/01/2012   Macromastia 10/06/2012   Neck pain  10/06/2012   Autoantibody titer positive 04/06/2009   Iron deficiency anemia due to chronic blood loss 03/15/2009   PAD (peripheral artery disease) (HCC) 07/10/2005   Hypertension, benign 03/29/2003   Depressive disorder 10/06/2001   Osteoarthrosis 03/24/2001   B-complex deficiency 05/01/1995   Esophagitis 08/18/1990   Obesity 11/18/1989    Orientation RESPIRATION BLADDER Height & Weight     Self, Time, Situation, Place  Normal Continent Weight: 96.3 kg Height:  5\' 6"  (167.6 cm)  BEHAVIORAL SYMPTOMS/MOOD NEUROLOGICAL BOWEL NUTRITION STATUS      Continent Diet (see discharge summary)  AMBULATORY STATUS COMMUNICATION OF NEEDS Skin   Extensive Assist Verbally Other (Comment), Surgical wounds (right leg)                       Personal Care Assistance Level of Assistance  Bathing, Feeding, Dressing Bathing Assistance: Maximum assistance Feeding assistance: Limited assistance Dressing Assistance: Maximum assistance     Functional Limitations Info  Sight, Hearing, Speech Sight Info: Adequate Hearing Info: Adequate Speech Info: Adequate    SPECIAL CARE FACTORS FREQUENCY  PT (By licensed PT), OT (By licensed OT)     PT Frequency: 5 times per week OT Frequency: 5 times per week            Contractures Contractures Info: Not present    Additional Factors Info  Code Status, Allergies Code Status Info: Full Allergies Info: Gabapentin, Plavix           Current Medications (01/05/2021):  This is the current  hospital active medication list Current Facility-Administered Medications  Medication Dose Route Frequency Provider Last Rate Last Admin   0.9 %  sodium chloride infusion (Manually program via Guardrails IV Fluids)   Intravenous Once Stegmayer, Kimberly A, PA-C       0.9 %  sodium chloride infusion  500 mL Intravenous Once PRN Annice Needy, MD       acetaminophen (TYLENOL) tablet 325-650 mg  325-650 mg Oral Q4H PRN Annice Needy, MD       Or   acetaminophen  (TYLENOL) suppository 325-650 mg  325-650 mg Rectal Q4H PRN Annice Needy, MD       albuterol (PROVENTIL) (2.5 MG/3ML) 0.083% nebulizer solution 2.5 mg  2.5 mg Nebulization Q6H PRN Annice Needy, MD       alum & mag hydroxide-simeth (MAALOX/MYLANTA) 200-200-20 MG/5ML suspension 15-30 mL  15-30 mL Oral Q2H PRN Annice Needy, MD       amitriptyline (ELAVIL) tablet 25 mg  25 mg Oral QHS Annice Needy, MD   25 mg at 01/04/21 2206   aspirin EC tablet 81 mg  81 mg Oral Daily Annice Needy, MD   81 mg at 01/05/21 0839   atorvastatin (LIPITOR) tablet 20 mg  20 mg Oral QHS Annice Needy, MD   20 mg at 01/04/21 2206   cholecalciferol (VITAMIN D) tablet 1,000 Units  1,000 Units Oral Daily Annice Needy, MD   1,000 Units at 01/05/21 0839   docusate sodium (COLACE) capsule 100 mg  100 mg Oral Daily Annice Needy, MD   100 mg at 01/05/21 0839   DOPamine (INTROPIN) 800 mg in dextrose 5 % 250 mL (3.2 mg/mL) infusion  3-5 mcg/kg/min Intravenous Continuous Annice Needy, MD   Held at 01/03/21 2021   enoxaparin (LOVENOX) injection 40 mg  40 mg Subcutaneous Q24H Annice Needy, MD   40 mg at 01/05/21 0843   famotidine (PEPCID) tablet 20 mg  20 mg Oral BID Tressie Ellis, RPH   20 mg at 01/05/21 6644   ferrous sulfate tablet 325 mg  325 mg Oral Bettey Mare, MD   325 mg at 01/05/21 0839   FLUoxetine (PROZAC) capsule 80 mg  80 mg Oral Daily Annice Needy, MD   80 mg at 01/04/21 0954   fluticasone (FLONASE) 50 MCG/ACT nasal spray 1 spray  1 spray Each Nare Daily PRN Annice Needy, MD       guaiFENesin-dextromethorphan (ROBITUSSIN DM) 100-10 MG/5ML syrup 15 mL  15 mL Oral Q4H PRN Annice Needy, MD       hydrALAZINE (APRESOLINE) injection 5 mg  5 mg Intravenous Q20 Min PRN Annice Needy, MD       ipratropium (ATROVENT) 0.03 % nasal spray 2 spray  2 spray Each Nare TID Annice Needy, MD   2 spray at 01/05/21 0842   labetalol (NORMODYNE) injection 10 mg  10 mg Intravenous Q10 min PRN Annice Needy, MD   10 mg at 01/05/21 1101    magnesium sulfate IVPB 2 g 50 mL  2 g Intravenous Daily PRN Annice Needy, MD       meclizine (ANTIVERT) tablet 25 mg  25 mg Oral TID PRN Annice Needy, MD       metoprolol tartrate (LOPRESSOR) injection 2-5 mg  2-5 mg Intravenous Q2H PRN Annice Needy, MD       morphine 2 MG/ML injection 2-5 mg  2-5 mg Intravenous Q1H PRN Annice Needy, MD       nitroGLYCERIN 50 mg in dextrose 5 % 250 mL (0.2 mg/mL) infusion  5-250 mcg/min Intravenous Titrated Annice Needy, MD   Held at 01/03/21 2020   ondansetron (ZOFRAN) injection 4 mg  4 mg Intravenous Q6H PRN Georgiana Spinner, NP       oxybutynin (DITROPAN-XL) 24 hr tablet 10 mg  10 mg Oral Daily Annice Needy, MD   10 mg at 01/05/21 0839   oxyCODONE-acetaminophen (PERCOCET/ROXICET) 5-325 MG per tablet 1-2 tablet  1-2 tablet Oral Q4H PRN Annice Needy, MD   1 tablet at 01/04/21 1042   pantoprazole (PROTONIX) EC tablet 40 mg  40 mg Oral Daily Annice Needy, MD   40 mg at 01/05/21 0839   phenol (CHLORASEPTIC) mouth spray 1 spray  1 spray Mouth/Throat PRN Annice Needy, MD       polyethylene glycol (MIRALAX / GLYCOLAX) packet 17 g  17 g Oral Daily PRN Annice Needy, MD       potassium chloride SA (KLOR-CON) CR tablet 20-40 mEq  20-40 mEq Oral Daily PRN Annice Needy, MD       prasugrel (EFFIENT) tablet 10 mg  10 mg Oral Daily Annice Needy, MD   10 mg at 01/05/21 0839   senna-docusate (Senokot-S) tablet 1 tablet  1 tablet Oral QHS PRN Annice Needy, MD       sorbitol 70 % solution 30 mL  30 mL Oral Daily PRN Annice Needy, MD       vitamin B-12 (CYANOCOBALAMIN) tablet 1,000 mcg  1,000 mcg Oral Bettey Mare, MD   1,000 mcg at 01/04/21 8984     Discharge Medications: Please see discharge summary for a list of discharge medications.  Relevant Imaging Results:  Relevant Lab Results:   Additional Information SS# [id number]  Allayne Butcher, RN

## 2021-01-05 NOTE — Progress Notes (Signed)
PT Cancellation Note  Patient Details Name: Jody Lowe MRN: [id number] DOB: 05-31-1945   Cancelled Treatment:    Reason Eval/Treat Not Completed: Medical issues which prohibited therapy. Chart reviewed. Pt Hgb trending down, now at 6.7. Secure chat with RN states MD is aware. Will follow up at a later time/date when pt is medically appropriate.    Basilia Jumbo PT, DPT 01/05/21 9:34 AM [phone]

## 2021-01-05 NOTE — TOC Initial Note (Signed)
Transition of Care [facility]) - Initial/Assessment Note    Patient Details  Name: Jody Lowe MRN: [id number] Date of Birth: 08/11/1945  [facility]) CM/SW Contact:    Shelbie Hutching, RN Phone Number: 01/05/2021, 1:44 PM  Clinical Narrative:                 Patient is status post right femoral artery endarterectomy and right femoral artery to below the knee popliteal artery bypass.  RNCM met with patient at the bedside this morning.  Patient is from home where she lives alone, is independent, walks with a walker, and drives.  Patient reports that her son Dina Rich lives close by and would be the family member to contact if needed.  PT is currently recommending SNF and patient agrees but she does not want to go back to [facility].  Patient prefers [facility].  TOC will begin bed search and SNF workup.  Expected Discharge Plan: [place] Barriers to Discharge: Continued Medical Work up   Patient Goals and CMS Choice Patient states their goals for this hospitalization and ongoing recovery are:: Patient agrees to short term rehab- does not want to go back to [employer].gov Compare Post Acute Care list provided to:: Patient Choice offered to / list presented to : Patient  Expected Discharge Plan and Services Expected Discharge Plan: [place]   Discharge Planning Services: CM Consult Post Acute Care Choice: [place] Living arrangements for the past 2 months: Apartment                 DME Arranged: N/A DME Agency: NA       HH Arranged: NA HH Agency: NA        Prior Living Arrangements/Services Living arrangements for the past 2 months: Apartment Lives with:: Self Patient language and need for interpreter reviewed:: Yes Do you feel safe going back to the place where you live?: Yes      Need for Family Participation in Patient Care: Yes (Comment) (arthrosclerosis) Care giver support  system in place?: Yes (comment) (son) Current home services: DME (walker) Criminal Activity/Legal Involvement Pertinent to Current Situation/Hospitalization: No - Comment as needed  Activities of Daily Living      Permission Sought/Granted Permission sought to share information with : Case Manager, Family Supports, Chartered certified accountant granted to share information with : Yes, Verbal Permission Granted  Share Information with NAME: Dina Rich  Permission granted to share info w AGENCY: [facility]  Permission granted to share info w Relationship: son  Permission granted to share info w Contact Information: [phone]  Emotional Assessment Appearance:: Appears stated age Attitude/Demeanor/Rapport: Engaged Affect (typically observed): Accepting Orientation: : Oriented to Self, Oriented to Place, Oriented to  Time, Oriented to Situation Alcohol / Substance Use: Not Applicable Psych Involvement: No (comment)  Admission diagnosis:  Atherosclerosis of artery of extremity with ulceration ([place]) [D17.616, L97.909] Patient Active Problem List   Diagnosis Date Noted   Atherosclerosis of artery of extremity with ulceration ([place]) 01/03/2021   Shortness of breath 12/07/2020   Critical lower limb ischemia (HCC) 12/07/2020   Generalized weakness 10/20/2020   Weakness    Syncope 10/16/2020   Drop in hemoglobin    B12 deficiency    Elevated troponin    Pain in both lower extremities    Chronic pain of both knees    Syncope and collapse 10/14/2020   Anemia 10/14/2020   AKI (acute kidney injury) ([place])  10/14/2020   Claudication in peripheral vascular disease ([place]) 09/04/2020   Falls frequently 09/04/2020   Stage 3a chronic kidney disease ([place]) 02/29/2020   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   TIA (transient ischemic attack) 05/01/2019   Hyperlipidemia 06/23/2017   Nasal congestion 05/08/2017   Tinnitus 05/08/2017   Back pain 04/21/2017   Shoulder pain  04/21/2017   Carpal tunnel syndrome 08/23/2016   Age-related osteoporosis without current pathological fracture 06/27/2016   Intention tremor 06/27/2016   Risk for falls 02/20/2016   Tobacco use disorder 05/09/2015   Dizziness 10/19/2014   Subclavian steal syndrome 10/19/2014   Allergic rhinitis 12/01/2012   Macromastia 10/06/2012   Neck pain 10/06/2012   Autoantibody titer positive 04/06/2009   Iron deficiency anemia due to chronic blood loss 03/15/2009   PAD (peripheral artery disease) ([place]) 07/10/2005   Hypertension, benign 03/29/2003   Depressive disorder 10/06/2001   Osteoarthrosis 03/24/2001   B-complex deficiency 05/01/1995   Esophagitis 08/18/1990   Obesity 11/18/1989   PCP:  Tomasita Morrow, MD Pharmacy:   [facility] [place] [address] [place] [address] Phone: [phone] Fax: [phone]     Social Determinants of Health ([place]) Interventions    Readmission Risk Interventions Readmission Risk Prevention Plan 01/05/2021  Transportation Screening Complete  PCP or Specialist Appt within 3-5 Days Complete  HRI or Home Care Consult Complete  Social Work Consult for [place] Planning/Counseling Complete  Palliative Care Screening Not Applicable  Medication Review Press photographer) Complete  Some recent data might be hidden

## 2021-01-06 LAB — BASIC METABOLIC PANEL
Anion gap: 7 (ref 5–15)
BUN: 15 mg/dL (ref 8–23)
CO2: 25 mmol/L (ref 22–32)
Calcium: 8.5 mg/dL — ABNORMAL LOW (ref 8.9–10.3)
Chloride: 99 mmol/L (ref 98–111)
Creatinine, Ser: 0.98 mg/dL (ref 0.44–1.00)
GFR, Estimated: 60 mL/min — ABNORMAL LOW (ref >60)
Glucose, Bld: 95 mg/dL (ref 70–99)
Potassium: 3.8 mmol/L (ref 3.5–5.1)
Sodium: 131 mmol/L — ABNORMAL LOW (ref 135–145)

## 2021-01-06 LAB — CBC
HCT: 21.9 % — ABNORMAL LOW (ref 36.0–46.0)
Hemoglobin: 7.3 g/dL — ABNORMAL LOW (ref 12.0–15.0)
MCH: 27.2 pg (ref 26.0–34.0)
MCHC: 33.3 g/dL (ref 30.0–36.0)
MCV: 81.7 fL (ref 80.0–100.0)
Platelets: 454 10*3/uL — ABNORMAL HIGH (ref 150–400)
RBC: 2.68 MIL/uL — ABNORMAL LOW (ref 3.87–5.11)
RDW: 16.5 % — ABNORMAL HIGH (ref 11.5–15.5)
WBC: 13.4 10*3/uL — ABNORMAL HIGH (ref 4.0–10.5)
nRBC: 0 % (ref 0.0–0.2)

## 2021-01-06 LAB — MAGNESIUM: Magnesium: 1.5 mg/dL — ABNORMAL LOW (ref 1.7–2.4)

## 2021-01-06 NOTE — Progress Notes (Signed)
Physical Therapy Treatment Patient Details Name: Jody Lowe MRN: [id number] DOB: 02-20-1946 Today's Date: 01/06/2021   History of Present Illness Pt is 75 y/o female s/p application of cell saver and s/p bypass graft-femorial-tibial artery.    PT Comments    Pt is POD#3 and is making slow progress towards her therapy goals. Pt requiring mod - maxA for bed mobility due to decreased B LE strength and poor trunk control. Pt requiring +2 assistance to stand x 3 at EOB with RW and unable to fully extend her knee. Pt continues to be motivated to participate to improve her overall mobility to be able to return home. Pt educated on benefits of SNF and pt more agreeable at this time to recommendation. Pt will continue to benefit from skilled PT services to progress mobility and decrease fall risk.    Recommendations for follow up therapy are one component of a multi-disciplinary discharge planning process, led by the attending physician.  Recommendations may be updated based on patient status, additional functional criteria and insurance authorization.  Follow Up Recommendations  SNF;Supervision/Assistance - 24 hour     Equipment Recommendations  None recommended by PT    Recommendations for Other Services       Precautions / Restrictions Precautions Precautions: Fall Restrictions Weight Bearing Restrictions: No     Mobility  Bed Mobility Overal bed mobility: Needs Assistance Bed Mobility: Supine to Sit;Sit to Supine     Supine to sit: Mod assist Sit to supine: Max assist   General bed mobility comments: ModA for management of B LEs off EOB. ModA at trunk to upright sitting    Transfers Overall transfer level: Needs assistance Equipment used: Rolling walker (2 wheeled) Transfers: Sit to/from Stand Sit to Stand: Max assist;+2 physical assistance;From elevated surface         General transfer comment: MaxA + 2 for standing x 3. Pt unable to fully extend her B hips and  knees in standing.  Ambulation/Gait             General Gait Details: Deferred due to safety   Stairs             Wheelchair Mobility    Modified Rankin (Stroke Patients Only)       Balance Overall balance assessment: Needs assistance Sitting-balance support: Feet supported;Bilateral upper extremity supported Sitting balance-Leahy Scale: Poor Sitting balance - Comments: Increased posterior lean with initial transition from supine to sit. Improved after sit to stand training and cues for forward trunk lean   Standing balance support: During functional activity;Bilateral upper extremity supported Standing balance-Leahy Scale: Poor Standing balance comment: Unable to maintain full standing balance without +2 assistance + B UE support on RW                            Cognition Arousal/Alertness: Awake/alert Behavior During Therapy: WFL for tasks assessed/performed Overall Cognitive Status: Within Functional Limits for tasks assessed                                        Exercises Other Exercises Other Exercises: Instructed patient on completion of ankle pumps, SAQ, quad sets, and hip abduction seated in chair position in bed. Pt verbalizing understanding.    General Comments        Pertinent Vitals/Pain Pain Assessment: No/denies pain    Home Living  Prior Function            PT Goals (current goals can now be found in the care plan section) Acute Rehab PT Goals Patient Stated Goal: to go home PT Goal Formulation: With patient Time For Goal Achievement: 01/18/21 Potential to Achieve Goals: Fair Progress towards PT goals: Progressing toward goals    Frequency    Min 2X/week      PT Plan Current plan remains appropriate    Co-evaluation              AM-PAC PT "6 Clicks" Mobility   Outcome Measure  Help needed turning from your back to your side while in a flat bed without  using bedrails?: A Little Help needed moving from lying on your back to sitting on the side of a flat bed without using bedrails?: A Lot Help needed moving to and from a bed to a chair (including a wheelchair)?: Total Help needed standing up from a chair using your arms (e.g., wheelchair or bedside chair)?: A Lot Help needed to walk in hospital room?: Total Help needed climbing 3-5 steps with a railing? : Total 6 Click Score: 10    End of Session Equipment Utilized During Treatment: Gait belt Activity Tolerance: Patient limited by fatigue;No increased pain Patient left: in bed;with call bell/phone within reach;with bed alarm set Nurse Communication: Mobility status PT Visit Diagnosis: Unsteadiness on feet (R26.81);Other abnormalities of gait and mobility (R26.89);Muscle weakness (generalized) (M62.81);History of falling (Z91.81);Difficulty in walking, not elsewhere classified (R26.2)     Time: 4097-3532 PT Time Calculation (min) (ACUTE ONLY): 16 min  Charges:  $Therapeutic Activity: 8-22 mins                     Verl Blalock, SPT    Verl Blalock 01/06/2021, 2:47 PM

## 2021-01-06 NOTE — Progress Notes (Signed)
3 Days Post-Op   Subjective/Chief Complaint: Doing OK. Without complaint. Denies Pain. States she wants to get OOB. Eating lunch upon my exam   Objective: Vital signs in last 24 hours: Temp:  [98.8 F (37.1 C)-99.3 F (37.4 C)] 98.9 F (37.2 C) (10/15 0757) Pulse Rate:  [81-103] 81 (10/15 0840) Resp:  [15-18] 15 (10/15 0757) BP: (151-174)/(52-79) 151/61 (10/15 0840) SpO2:  [96 %-98 %] 98 % (10/15 0757) Last BM Date: 01/02/21  Intake/Output from previous day: 10/14 0701 - 10/15 0700 In: 690 [P.O.:300; Blood:390] Out: 2720 [Urine:2720] Intake/Output this shift: No intake/output data recorded.  General appearance: alert and no distress Cardio: regular rate and rhythm Extremities: warm, Prevena in place, thigh, calf soft, dressing C/D/I, foot warm  Lab Results:  Recent Labs    01/05/21 1614 01/06/21 0430  WBC 14.4* 13.4*  HGB 7.7* 7.3*  HCT 23.3* 21.9*  PLT 433* 454*   BMET Recent Labs    01/05/21 0508 01/06/21 0430  NA 132* 131*  K 4.1 3.8  CL 103 99  CO2 22 25  GLUCOSE 115* 95  BUN 16 15  CREATININE 1.20* 0.98  CALCIUM 8.3* 8.5*   PT/INR No results for input(s): LABPROT, INR in the last 72 hours. ABG No results for input(s): PHART, HCO3 in the last 72 hours.  Invalid input(s): PCO2, PO2  Studies/Results: No results found.  Anti-infectives: Anti-infectives (From admission, onward)    Start     Dose/Rate Route Frequency Ordered Stop   01/03/21 2100  ceFAZolin (ANCEF) IVPB 2g/100 mL premix        2 g 200 mL/hr over 30 Minutes Intravenous Every 8 hours 01/03/21 1859 01/04/21 0447   01/03/21 0857  ceFAZolin (ANCEF) 2-4 GM/100ML-% IVPB       Note to Pharmacy: Rayann Heman   : cabinet override      01/03/21 0857 01/03/21 2114   01/03/21 0745  ceFAZolin (ANCEF) 2-4 GM/100ML-% IVPB       Note to Pharmacy: Rayann Heman   : cabinet override      01/03/21 0745 01/03/21 1029   01/03/21 0600  ceFAZolin (ANCEF) IVPB 2g/100 mL premix        2 g 200  mL/hr over 30 Minutes Intravenous On call to O.R. 01/02/21 2235 01/03/21 1435   01/02/21 2245  ceFAZolin (ANCEF) IVPB 2g/100 mL premix  Status:  Discontinued        2 g 200 mL/hr over 30 Minutes Intravenous  Once 01/02/21 2235 01/02/21 2236       Assessment/Plan: s/p Procedure(s): APPLICATION OF CELL SAVER (N/A) BYPASS GRAFT FEMORAL-TIBIAL ARTERY (Right) VEIN HARVEST (Right)   POD#3 s/p Right common femoral artery and profunda femoris artery endarterectomy with patch angioplasty. Right femoral artery to below-the-knee popliteal artery bypass with in-situ saphenous vein graft. Redo operation right femoral artery. Monitor HgB Replete Electrolytes OOB to chair with PT Monitor BP- hypertensive this morning- improved with labetalol Will change dressing later today or in the morning.    LOS: 3 days    Eli Hose A 01/06/2021

## 2021-01-07 LAB — CBC
HCT: 19.9 % — ABNORMAL LOW (ref 36.0–46.0)
Hemoglobin: 6.5 g/dL — ABNORMAL LOW (ref 12.0–15.0)
MCH: 27.1 pg (ref 26.0–34.0)
MCHC: 32.7 g/dL (ref 30.0–36.0)
MCV: 82.9 fL (ref 80.0–100.0)
Platelets: 402 10*3/uL — ABNORMAL HIGH (ref 150–400)
RBC: 2.4 MIL/uL — ABNORMAL LOW (ref 3.87–5.11)
RDW: 16.1 % — ABNORMAL HIGH (ref 11.5–15.5)
WBC: 12.5 10*3/uL — ABNORMAL HIGH (ref 4.0–10.5)
nRBC: 0 % (ref 0.0–0.2)

## 2021-01-07 LAB — BASIC METABOLIC PANEL
Anion gap: 7 (ref 5–15)
BUN: 17 mg/dL (ref 8–23)
CO2: 24 mmol/L (ref 22–32)
Calcium: 8.4 mg/dL — ABNORMAL LOW (ref 8.9–10.3)
Chloride: 99 mmol/L (ref 98–111)
Creatinine, Ser: 0.97 mg/dL (ref 0.44–1.00)
GFR, Estimated: >60 mL/min (ref >60)
Glucose, Bld: 97 mg/dL (ref 70–99)
Potassium: 3.8 mmol/L (ref 3.5–5.1)
Sodium: 130 mmol/L — ABNORMAL LOW (ref 135–145)

## 2021-01-07 LAB — HEMOGLOBIN AND HEMATOCRIT, BLOOD
HCT: 25.6 % — ABNORMAL LOW (ref 36.0–46.0)
Hemoglobin: 8.7 g/dL — ABNORMAL LOW (ref 12.0–15.0)

## 2021-01-07 LAB — GLUCOSE, CAPILLARY: Glucose-Capillary: 107 mg/dL — ABNORMAL HIGH (ref 70–99)

## 2021-01-07 LAB — MAGNESIUM: Magnesium: 2 mg/dL (ref 1.7–2.4)

## 2021-01-07 LAB — PREPARE RBC (CROSSMATCH)

## 2021-01-07 MED ORDER — ACETAMINOPHEN 325 MG PO TABS
650.0000 mg | ORAL_TABLET | Freq: Once | ORAL | Status: AC
  Administered 2021-01-07: 11:00:00 650 mg via ORAL
  Filled 2021-01-07: qty 2

## 2021-01-07 MED ORDER — ENSURE ENLIVE PO LIQD
237.0000 mL | Freq: Two times a day (BID) | ORAL | Status: DC
  Administered 2021-01-07 – 2021-01-09 (×5): 237 mL via ORAL

## 2021-01-07 MED ORDER — SODIUM CHLORIDE 0.9% IV SOLUTION: Freq: Once | INTRAVENOUS | Status: AC

## 2021-01-07 NOTE — Progress Notes (Signed)
CSW left a message with [employer] requesting a call back

## 2021-01-07 NOTE — Progress Notes (Signed)
PIV established via ultrasound guidance by IV team. Blood transfusion resumed.

## 2021-01-07 NOTE — Progress Notes (Signed)
4 Days Post-Op   Subjective/Chief Complaint: Doing Ok this morning. Without complaint. Difficulty getting OOB with PT yesterday. HgB 6.5 this morning. No signs/symptoms of overt bleeding. Patient reports she has a remote history of GI bleeding requiring intervention.   Objective: Vital signs in last 24 hours: Temp:  [97.7 F (36.5 C)-98.6 F (37 C)] 97.7 F (36.5 C) (10/16 0744) Pulse Rate:  [88-99] 90 (10/16 0744) Resp:  [16] 16 (10/16 0744) BP: (114-154)/(52-130) 139/52 (10/16 0744) SpO2:  [98 %-100 %] 99 % (10/16 0744) Last BM Date: 01/02/21  Intake/Output from previous day: 10/15 0701 - 10/16 0700 In: 480 [P.O.:480] Out: 500 [Urine:500] Intake/Output this shift: No intake/output data recorded.  General appearance: alert Cardio: regular rate and rhythm GI: soft, non-tender; bowel sounds normal; no masses,  no organomegaly Extremities: Incisions- Prevena in place- no drainage, thigh/calf soft, foot warm , foot dressing in place  Lab Results:  Recent Labs    01/06/21 0430 01/07/21 0515  WBC 13.4* 12.5*  HGB 7.3* 6.5*  HCT 21.9* 19.9*  PLT 454* 402*   BMET Recent Labs    01/06/21 0430 01/07/21 0515  NA 131* 130*  K 3.8 3.8  CL 99 99  CO2 25 24  GLUCOSE 95 97  BUN 15 17  CREATININE 0.98 0.97  CALCIUM 8.5* 8.4*   PT/INR No results for input(s): LABPROT, INR in the last 72 hours. ABG No results for input(s): PHART, HCO3 in the last 72 hours.  Invalid input(s): PCO2, PO2  Studies/Results: No results found.  Anti-infectives: Anti-infectives (From admission, onward)    Start     Dose/Rate Route Frequency Ordered Stop   01/03/21 2100  ceFAZolin (ANCEF) IVPB 2g/100 mL premix        2 g 200 mL/hr over 30 Minutes Intravenous Every 8 hours 01/03/21 1859 01/04/21 0447   01/03/21 0857  ceFAZolin (ANCEF) 2-4 GM/100ML-% IVPB       Note to Pharmacy: Rayann Heman   : cabinet override      01/03/21 0857 01/03/21 2114   01/03/21 0745  ceFAZolin (ANCEF) 2-4  GM/100ML-% IVPB       Note to Pharmacy: Rayann Heman   : cabinet override      01/03/21 0745 01/03/21 1029   01/03/21 0600  ceFAZolin (ANCEF) IVPB 2g/100 mL premix        2 g 200 mL/hr over 30 Minutes Intravenous On call to O.R. 01/02/21 2235 01/03/21 1435   01/02/21 2245  ceFAZolin (ANCEF) IVPB 2g/100 mL premix  Status:  Discontinued        2 g 200 mL/hr over 30 Minutes Intravenous  Once 01/02/21 2235 01/02/21 2236       Assessment/Plan: s/p Procedure(s): APPLICATION OF CELL SAVER (N/A) BYPASS GRAFT FEMORAL-TIBIAL ARTERY (Right) VEIN HARVEST (Right) POD #4 s/p Right common femoral artery and profunda femoris artery endarterectomy with patch angioplasty. Right femoral artery to below-the-knee popliteal artery bypass with in-situ saphenous vein graft. Redo operation right femoral artery. HgB 6.5- continued anemia despite no overt signs of bleeding and recent transfusion, HD stable.- Will transfuse 2 UpRBC, GI consult, Hemoccult. Will need SNF placement  LOS: 4 days    Eli Hose A 01/07/2021

## 2021-01-07 NOTE — Progress Notes (Signed)
2nd unit PRBCs finished infusing. No signs or symptoms of reaction noted.

## 2021-01-07 NOTE — Progress Notes (Signed)
First unit of PRBCs infusing without difficulty. No signs or symptoms of transfusion reaction noted.

## 2021-01-07 NOTE — Progress Notes (Signed)
Blood transfusion stopped, as PIV noted to be infiltrated and leaking. Patient denies any pain or other complaints at the site. IV team notified of STAT placement order.

## 2021-01-08 LAB — BASIC METABOLIC PANEL
Anion gap: 5 (ref 5–15)
BUN: 17 mg/dL (ref 8–23)
CO2: 24 mmol/L (ref 22–32)
Calcium: 8.5 mg/dL — ABNORMAL LOW (ref 8.9–10.3)
Chloride: 101 mmol/L (ref 98–111)
Creatinine, Ser: 1 mg/dL (ref 0.44–1.00)
GFR, Estimated: 58 mL/min — ABNORMAL LOW (ref >60)
Glucose, Bld: 112 mg/dL — ABNORMAL HIGH (ref 70–99)
Potassium: 3.7 mmol/L (ref 3.5–5.1)
Sodium: 130 mmol/L — ABNORMAL LOW (ref 135–145)

## 2021-01-08 LAB — CBC
HCT: 26.2 % — ABNORMAL LOW (ref 36.0–46.0)
Hemoglobin: 8.9 g/dL — ABNORMAL LOW (ref 12.0–15.0)
MCH: 27.8 pg (ref 26.0–34.0)
MCHC: 34 g/dL (ref 30.0–36.0)
MCV: 81.9 fL (ref 80.0–100.0)
Platelets: 455 10*3/uL — ABNORMAL HIGH (ref 150–400)
RBC: 3.2 MIL/uL — ABNORMAL LOW (ref 3.87–5.11)
RDW: 15.6 % — ABNORMAL HIGH (ref 11.5–15.5)
WBC: 12.6 10*3/uL — ABNORMAL HIGH (ref 4.0–10.5)
nRBC: 0.2 % (ref 0.0–0.2)

## 2021-01-08 LAB — TYPE AND SCREEN
ABO/RH(D): O POS
Antibody Screen: POSITIVE

## 2021-01-08 LAB — MAGNESIUM: Magnesium: 1.7 mg/dL (ref 1.7–2.4)

## 2021-01-08 MED ORDER — MAGNESIUM SULFATE 2 GM/50ML IV SOLN
2.0000 g | Freq: Once | INTRAVENOUS | Status: AC
  Administered 2021-01-08: 2 g via INTRAVENOUS
  Filled 2021-01-08: qty 50

## 2021-01-08 NOTE — Progress Notes (Signed)
Occupational Therapy Treatment Patient Details Name: Jody Lowe MRN: [id number] DOB: 1946/02/06 Today's Date: 01/08/2021   History of present illness Pt is 75 y/o female s/p application of cell saver and s/p bypass graft-femorial-tibial artery.   OT comments  Upon entering the room, pt very motivated for OT intervention. Bed mobility with min A for management of R LE to EOB. Pt's sitting balance much improves with mod I for static sitting balance for ~ 4 minutes. OT demonstrates anterior weight shift and cuing for "nose over toes". Pt returns demonstrations with min A x 2 reps. Pt then stands and side steps L <> R along bed with min A for safety. Pt returning to EOB and bed lowered for each stand with pt continuing to need min A but bed not fully lowered to lowest setting of 24 inches. OT discussed this as being goal for next session and to transfer to recliner chair. Pt is encouraged from session but very fatigued as well. She returns to bed with min A for R LE into bed and all needs within reach. Pt continues to benefit from OT intervention. Mod cuing needing to facilitate anterior weight shift this session.    Recommendations for follow up therapy are one component of a multi-disciplinary discharge planning process, led by the attending physician.  Recommendations may be updated based on patient status, additional functional criteria and insurance authorization.    Follow Up Recommendations  SNF    Equipment Recommendations  Other (comment) (defer to next venue of care)       Precautions / Restrictions Precautions Precautions: Fall Restrictions Weight Bearing Restrictions: No       Mobility Bed Mobility Overal bed mobility: Needs Assistance Bed Mobility: Supine to Sit;Sit to Supine     Supine to sit: Min assist Sit to supine: Min assist   General bed mobility comments: min A for management of R LE    Transfers Overall transfer level: Needs assistance Equipment used:  Rolling walker (2 wheeled) Transfers: Sit to/from Stand Sit to Stand: Min assist;From elevated surface         General transfer comment: min A from elevated surface with cuing for forwards weight shift.Pt standing 5 reps and bed lowered each time.    Balance Overall balance assessment: Needs assistance Sitting-balance support: Feet supported;Bilateral upper extremity supported Sitting balance-Leahy Scale: Fair     Standing balance support: During functional activity;Bilateral upper extremity supported Standing balance-Leahy Scale: Fair Standing balance comment: use of B UE                           ADL either performed or assessed with clinical judgement     Vision Patient Visual Report: No change from baseline            Cognition Arousal/Alertness: Awake/alert Behavior During Therapy: WFL for tasks assessed/performed Overall Cognitive Status: Within Functional Limits for tasks assessed                                 General Comments: A&Ox4. Pt is very motivated for therapeutic intervention.                   Pertinent Vitals/ Pain       Pain Assessment: No/denies pain         Frequency  Min 2X/week        Progress Toward Goals  OT Goals(current goals can now be found in the care plan section)  Progress towards OT goals: Progressing toward goals  Acute Rehab OT Goals Patient Stated Goal: to go home OT Goal Formulation: With patient Time For Goal Achievement: 01/18/21 Potential to Achieve Goals: Fair  Plan Discharge plan remains appropriate;Frequency remains appropriate       AM-PAC OT "6 Clicks" Daily Activity     Outcome Measure   Help from another person eating meals?: None Help from another person taking care of personal grooming?: A Little Help from another person toileting, which includes using toliet, bedpan, or urinal?: A Lot Help from another person bathing (including washing, rinsing, drying)?: A Lot Help  from another person to put on and taking off regular upper body clothing?: A Little Help from another person to put on and taking off regular lower body clothing?: A Lot 6 Click Score: 16    End of Session Equipment Utilized During Treatment: Rolling walker  OT Visit Diagnosis: Unsteadiness on feet (R26.81);Muscle weakness (generalized) (M62.81);Repeated falls (R29.6);Pain   Activity Tolerance Patient tolerated treatment well   Patient Left with call bell/phone within reach;with bed alarm set;in bed   Nurse Communication Mobility status        Time: 3546-5681 OT Time Calculation (min): 26 min  Charges: OT General Charges $OT Visit: 1 Visit OT Treatments $Therapeutic Activity: 23-37 mins  Jackquline Denmark, MS, OTR/L , CBIS ascom [phone]  01/08/21, 11:13 AM

## 2021-01-08 NOTE — Consult Note (Signed)
WOC consulted for NPWT right groin and right leg.  They are both incisional NPWT, I have requested follow up by the vascular PA to remove or notify nursing of orders.   Khyleigh Furney [facility], CNS, [employer] [phone]

## 2021-01-08 NOTE — TOC Progression Note (Signed)
Transition of Care [facility]) - Progression Note    Patient Details  Name: Jody Lowe MRN: [id number] Date of Birth: 1946-03-10  Transition of Care [facility]) CM/SW Contact  Allayne Butcher, RN Phone Number: 01/08/2021, 2:46 PM  Clinical Narrative:    Plan for discharge to [facility] tomorrow as long as hgb remains stable.  [facility] offered a bed and patient accepts.  RNCM did inform patient that she has used 17 days at [facility] and if she stays longer than 3 days she will likely have a copay.  Patient reports this is okay and reports she shouldn't have to stay for long at rehab.  RNCM has started insurance auth with [facility] via [employer].  PA has ordered COVID test for DC tomorrow.    Expected Discharge Plan: Skilled Nursing Facility Barriers to Discharge: Continued Medical Work up  Expected Discharge Plan and Services Expected Discharge Plan: Skilled Nursing Facility   Discharge Planning Services: CM Consult Post Acute Care Choice: Skilled Nursing Facility Living arrangements for the past 2 months: Apartment                 DME Arranged: N/A DME Agency: NA       HH Arranged: NA HH Agency: NA         Social Determinants of Health (SDOH) Interventions    Readmission Risk Interventions Readmission Risk Prevention Plan 01/05/2021  Transportation Screening Complete  PCP or Specialist Appt within 3-5 Days Complete  HRI or Home Care Consult Complete  Social Work Consult for Recovery Care Planning/Counseling Complete  Palliative Care Screening Not Applicable  Medication Review Oceanographer) Complete  Some recent data might be hidden

## 2021-01-08 NOTE — Progress Notes (Signed)
[facility] Daily Progress Note  01/03/21: Right common femoral artery and profunda femoris artery endarterectomy with patch angioplasty. Right femoral artery to below-the-knee popliteal artery bypass with in-situ saphenous vein graft. Redo operation right femoral artery.  Subjective: Patient without complaint this AM. No acute issues overnight.  Working with PT. Says pain is controlled.   Objective: Vitals:   01/07/21 2049 01/08/21 0011 01/08/21 0607 01/08/21 0751  BP: (!) 146/47 (!) 112/48 (!) 148/57 129/62  Pulse: 88 92 87 91  Resp: 18 16 16 18   Temp: 98.7 F (37.1 C) 98.9 F (37.2 C) 98.6 F (37 C) 98.2 F (36.8 C)  TempSrc: Oral Oral Oral Oral  SpO2: 96% 96% 97% 96%  Weight:      Height:        Intake/Output Summary (Last 24 hours) at 01/08/2021 1103 Last data filed at 01/08/2021 1022 Gross per 24 hour  Intake 960 ml  Output 900 ml  Net 60 ml   Physical Exam: A&Ox3, NAD CV: RRR Pulmonary: CTA Bilaterally Abdomen: Soft, Nontender, Nondistended Vascular:              Right lower extremity: Thigh soft.  Calf soft.  Foot is warm.  OR dressings intact, clean, and dry.  Compression wrap still intact.  Motor/sensory is intact.    Laboratory: CBC    Component Value Date/Time   WBC 12.6 (H) 01/08/2021 0450   HGB 8.9 (L) 01/08/2021 0450   HCT 26.2 (L) 01/08/2021 0450   PLT 455 (H) 01/08/2021 0450   BMET    Component Value Date/Time   NA 130 (L) 01/08/2021 0450   K 3.7 01/08/2021 0450   CL 101 01/08/2021 0450   CO2 24 01/08/2021 0450   GLUCOSE 112 (H) 01/08/2021 0450   BUN 17 01/08/2021 0450   CREATININE 1.00 01/08/2021 0450   CALCIUM 8.5 (L) 01/08/2021 0450   GFRNONAA 58 (L) 01/08/2021 0450   GFRAA >60 09/20/2019 1312   Assessment/Planning: The patient is a 75 year old female s/p right common femoral artery endarterectomy with right femoral to below the knee popliteal artery bypass - POD#5   1) On today's physical exam, the patients  bypass is patent and palpable. There is no acute compromise noted to the right lower extremity at this time. Extremity is soft. Foot is warm.  2) Foley removed POD#1. Patient passed trial of void. Making good urine. 3) Patient has received three units packed red blood cells during her inpatient stay (Friday and then on Sunday). Last two hemoglobins are stable.  AM CBC.  Gastroenterology and hematology consulted yesterday (Sunday 10/16). Pending consults. 4) Will plan on removing VAC and dressings prior to discharge. 5) Tolerating regular diet. 6) Lovenox for DVT prophylaxis. 7) On aspirin, statin and Effient for anticoagulation. 8) OK to work with PT & OT / Ambulate 9) At this time, occupational / physical therapy recommending SNF placement however the patient would really like to go home with visiting services.  Patient states she has home PT and visiting nurse services currently.   Discussed with Dr. 02-03-1975 Demetria Lightsey PA-C 01/08/2021 11:03 AM

## 2021-01-08 NOTE — Progress Notes (Signed)
Physical Therapy Treatment Patient Details Name: Jody Lowe MRN: [id number] DOB: 14-May-1945 Today's Date: 01/08/2021   History of Present Illness Pt is 75 y/o female s/p application of cell saver and s/p bypass graft-femorial-tibial artery.    PT Comments    Pt stated she participated in OT just prior and was pleased with her abilities today.  She declined further standing but wished to do supine HEP.  Education provided and she was able to complete ex's x 10.  Encouraged HEP 3-4 x per day.   Recommendations for follow up therapy are one component of a multi-disciplinary discharge planning process, led by the attending physician.  Recommendations may be updated based on patient status, additional functional criteria and insurance authorization.  Follow Up Recommendations  SNF;Supervision/Assistance - 24 hour     Equipment Recommendations  None recommended by PT    Recommendations for Other Services       Precautions / Restrictions Precautions Precautions: Fall Restrictions Weight Bearing Restrictions: No     Mobility  Bed Mobility Overal bed mobility: Needs Assistance Bed Mobility: Supine to Sit;Sit to Supine     Supine to sit: Min assist Sit to supine: Min assist   General bed mobility comments: deferred due to recent particiaption in OT    Transfers Overall transfer level: Needs assistance Equipment used: Rolling walker (2 wheeled) Transfers: Sit to/from Stand Sit to Stand: Min assist;From elevated surface         General transfer comment: min A from elevated surface with cuing for forwards weight shift.Pt standing 5 reps and bed lowered each time.  Ambulation/Gait                 Stairs             Wheelchair Mobility    Modified Rankin (Stroke Patients Only)       Balance Overall balance assessment: Needs assistance Sitting-balance support: Feet supported;Bilateral upper extremity supported Sitting balance-Leahy Scale: Fair      Standing balance support: During functional activity;Bilateral upper extremity supported Standing balance-Leahy Scale: Fair Standing balance comment: use of B UE                            Cognition Arousal/Alertness: Awake/alert Behavior During Therapy: WFL for tasks assessed/performed Overall Cognitive Status: Within Functional Limits for tasks assessed                                 General Comments: A&Ox4. Pt is very motivated for therapeutic intervention.      Exercises Other Exercises Other Exercises: BLE HEP education and demo x 10    General Comments        Pertinent Vitals/Pain Pain Assessment: Faces Faces Pain Scale: Hurts a little bit Pain Location: R LE Pain Descriptors / Indicators: Discomfort;Grimacing;Guarding Pain Intervention(s): Limited activity within patient's tolerance;Monitored during session;Premedicated before session    Home Living                      Prior Function            PT Goals (current goals can now be found in the care plan section) Acute Rehab PT Goals Patient Stated Goal: to go home Progress towards PT goals: Progressing toward goals    Frequency    Min 2X/week      PT Plan Current plan remains appropriate  Co-evaluation              AM-PAC PT "6 Clicks" Mobility   Outcome Measure  Help needed turning from your back to your side while in a flat bed without using bedrails?: A Little Help needed moving from lying on your back to sitting on the side of a flat bed without using bedrails?: A Lot Help needed moving to and from a bed to a chair (including a wheelchair)?: A Lot Help needed standing up from a chair using your arms (e.g., wheelchair or bedside chair)?: A Lot Help needed to walk in hospital room?: Total Help needed climbing 3-5 steps with a railing? : Total 6 Click Score: 11    End of Session   Activity Tolerance: Patient tolerated treatment well Patient left:  in bed;with call bell/phone within reach;with bed alarm set Nurse Communication: Mobility status PT Visit Diagnosis: Unsteadiness on feet (R26.81);Other abnormalities of gait and mobility (R26.89);Muscle weakness (generalized) (M62.81);History of falling (Z91.81);Difficulty in walking, not elsewhere classified (R26.2)     Time: 5409-8119 PT Time Calculation (min) (ACUTE ONLY): 17 min  Charges:  $Therapeutic Exercise: 8-22 mins                    Danielle Dess, PTA 01/08/21, 12:58 PM

## 2021-01-08 NOTE — Care Management Important Message (Signed)
Important Message  Patient Details  Name: Jody Lowe MRN: [id number] Date of Birth: 05-22-1945   Medicare Important Message Given:  Yes     Alieyah Spader, Stephan Minister 01/08/2021, 1:55 PM

## 2021-01-09 ENCOUNTER — Encounter: Payer: Self-pay | Admitting: Vascular Surgery

## 2021-01-09 ENCOUNTER — Other Ambulatory Visit: Payer: Self-pay

## 2021-01-09 LAB — TYPE AND SCREEN
ABO/RH(D): O POS
Antibody Screen: POSITIVE
Donor AG Type: NEGATIVE
Donor AG Type: NEGATIVE
Donor AG Type: NEGATIVE
Unit division: 0
Unit division: 0
Unit division: 0
Unit division: 0
Unit division: 0

## 2021-01-09 LAB — BASIC METABOLIC PANEL
Anion gap: 4 — ABNORMAL LOW (ref 5–15)
BUN: 25 mg/dL — ABNORMAL HIGH (ref 8–23)
CO2: 26 mmol/L (ref 22–32)
Calcium: 8.5 mg/dL — ABNORMAL LOW (ref 8.9–10.3)
Chloride: 100 mmol/L (ref 98–111)
Creatinine, Ser: 0.97 mg/dL (ref 0.44–1.00)
GFR, Estimated: >60 mL/min (ref >60)
Glucose, Bld: 118 mg/dL — ABNORMAL HIGH (ref 70–99)
Potassium: 3.8 mmol/L (ref 3.5–5.1)
Sodium: 130 mmol/L — ABNORMAL LOW (ref 135–145)

## 2021-01-09 LAB — CBC
HCT: 26.5 % — ABNORMAL LOW (ref 36.0–46.0)
Hemoglobin: 8.6 g/dL — ABNORMAL LOW (ref 12.0–15.0)
MCH: 26.7 pg (ref 26.0–34.0)
MCHC: 32.5 g/dL (ref 30.0–36.0)
MCV: 82.3 fL (ref 80.0–100.0)
Platelets: 497 10*3/uL — ABNORMAL HIGH (ref 150–400)
RBC: 3.22 MIL/uL — ABNORMAL LOW (ref 3.87–5.11)
RDW: 16.1 % — ABNORMAL HIGH (ref 11.5–15.5)
WBC: 12.2 10*3/uL — ABNORMAL HIGH (ref 4.0–10.5)
nRBC: 0 % (ref 0.0–0.2)

## 2021-01-09 LAB — BPAM RBC
Blood Product Expiration Date: 202210262359
Blood Product Expiration Date: 202210262359
Blood Product Expiration Date: 202210262359
Blood Product Expiration Date: 202210292359
Blood Product Expiration Date: 202211012359
ISSUE DATE / TIME: 202210141030
ISSUE DATE / TIME: 202210161100
ISSUE DATE / TIME: 202210161520
Unit Type and Rh: 5100
Unit Type and Rh: 5100
Unit Type and Rh: 5100
Unit Type and Rh: 5100
Unit Type and Rh: 5100

## 2021-01-09 LAB — MAGNESIUM: Magnesium: 1.7 mg/dL (ref 1.7–2.4)

## 2021-01-09 LAB — SARS CORONAVIRUS 2 (TAT 6-24 HRS): SARS Coronavirus 2: NEGATIVE

## 2021-01-09 MED ORDER — SODIUM CHLORIDE 1 G PO TABS
2.0000 g | ORAL_TABLET | Freq: Once | ORAL | Status: AC
  Administered 2021-01-09: 2 g via ORAL
  Filled 2021-01-09: qty 2

## 2021-01-09 MED ORDER — OXYCODONE-ACETAMINOPHEN 5-325 MG PO TABS: 1.0000 | ORAL_TABLET | Freq: Four times a day (QID) | ORAL | Status: DC | PRN

## 2021-01-09 MED ORDER — MAGNESIUM SULFATE 2 GM/50ML IV SOLN
2.0000 g | Freq: Once | INTRAVENOUS | Status: AC
  Administered 2021-01-09: 2 g via INTRAVENOUS
  Filled 2021-01-09: qty 50

## 2021-01-09 MED ORDER — JUVEN PO PACK: 1.0000 | PACK | Freq: Two times a day (BID) | ORAL | Status: DC

## 2021-01-09 MED ORDER — OXYCODONE-ACETAMINOPHEN 5-325 MG PO TABS: 1.0000 | ORAL_TABLET | Freq: Four times a day (QID) | ORAL | 0 refills | Status: DC | PRN

## 2021-01-09 NOTE — Progress Notes (Signed)
Patient transported [facility] via EMS in stable condition.

## 2021-01-09 NOTE — TOC Transition Note (Signed)
Transition of Care [facility]) - CM/SW Discharge Note   Patient Details  Name: Jody Lowe MRN: [id number] Date of Birth: 09/28/1945  Transition of Care [facility]) CM/SW Contact:  Allayne Butcher, RN Phone Number: 01/09/2021, 12:04 PM   Clinical Narrative:    Patient is medically cleared for discharge home with home health today.  PT worked with patient this morning and she was able to walk in the room and sit up in the recliner.  PT still recommends SNF but if patient going home she should have a wheelchair. Patient does have a walker, wheelchair, and 3 in 1 at home.  Patient reports that her sister will be staying with her and her son will be picking her up today.   [facility] was going to require 5 days worth of copays up front, which was over $900, patient cannot afford this so she wants to go home.  Patient was really wanting to go home over SNF anyway.  Patient is current with [facility], Cala Bradford with [facility] notified of discharge today and HH orders are in.     Final next level of care: Home w Home Health Services Barriers to Discharge: Barriers Resolved   Patient Goals and CMS Choice Patient states their goals for this hospitalization and ongoing recovery are:: Patient wants to go home with home health CMS Medicare.gov Compare Post Acute Care list provided to:: Patient Choice offered to / list presented to : Patient  Discharge Placement                       Discharge Plan and Services   Discharge Planning Services: CM Consult Post Acute Care Choice: Skilled Nursing Facility          DME Arranged: N/A DME Agency: NA       HH Arranged: PT, OT, Nurse's Aide HH Agency: [facility] Date [facility] Agency Contacted: 01/09/21 Time HH Agency Contacted: 1203 Representative spoke with at [facility] Agency: Cala Bradford  Social Determinants of Health (SDOH) Interventions     Readmission Risk Interventions Readmission Risk Prevention Plan 01/05/2021  Transportation Screening  Complete  PCP or Specialist Appt within 3-5 Days Complete  HRI or Home Care Consult Complete  Social Work Consult for Recovery Care Planning/Counseling Complete  Palliative Care Screening Not Applicable  Medication Review Oceanographer) Complete  Some recent data might be hidden

## 2021-01-09 NOTE — Progress Notes (Signed)
Physical Therapy Treatment Patient Details Name: Jody Lowe MRN: [id number] DOB: 10/05/1945 Today's Date: 01/09/2021   History of Present Illness Pt is 75 y/o female s/p application of cell saver and s/p bypass graft-femorial-tibial artery.    PT Comments    Pt resting in bed upon PT arrival; agreeable and motivated to participate in therapy and pt stating multiple times during session that she is going to go home from hospital and trusts that she will be able to figure it out at home.  SBA semi-supine to sitting edge of bed; min to mod assist to stand from mildly elevated bed height but CGA to stand from recliner; and CGA to ambulate 20 feet with RW.  Pt noted with increased difficulty advancing R LE during ambulation.  0/10 pain reported during session.  Pt would still benefit from SNF but pt requesting home discharge.  Discussed/educated pt on recommendations for 24/7 assist, use of RW, use of BSC, and use of manual w/c at home with 24/7 assist for safety: pt reports having all of that equipment and would check to make sure she had 24/7 assist: TOC updated on pt's status.   Recommendations for follow up therapy are one component of a multi-disciplinary discharge planning process, led by the attending physician.  Recommendations may be updated based on patient status, additional functional criteria and insurance authorization.  Follow Up Recommendations  SNF;Supervision/Assistance - 24 hour     Equipment Recommendations  Rolling walker with 5" wheels;3in1 (PT);Wheelchair (measurements PT);Wheelchair cushion (measurements PT)    Recommendations for Other Services       Precautions / Restrictions Precautions Precautions: Fall Restrictions Weight Bearing Restrictions: No     Mobility  Bed Mobility Overal bed mobility: Needs Assistance Bed Mobility: Supine to Sit     Supine to sit: Supervision     General bed mobility comments: use of bed rail; mild increased effort to  perform on own    Transfers Overall transfer level: Needs assistance Equipment used: Rolling walker (2 wheeled) Transfers: Sit to/from [employer] Sit to Stand: Min assist;Mod assist (bed height mildly elevated; vc's for UE/LE placement; increased effort and time and assist to stand up to RW from bed; CGA to stand from recliner x2 trials (use of B armrests to stand)) Stand pivot transfers: Min guard (stand step turn bed to recliner with RW use)       General transfer comment: decreased R LE foot clearance taking steps bed to recliner with RW  Ambulation/Gait Ambulation/Gait assistance: Min guard Gait Distance (Feet): 20 Feet Assistive device: Rolling walker (2 wheeled)   Gait velocity: decreased   General Gait Details: decreased stance time R LE; increased effort to take steps with RW; decreased R LE step length and foot clearance   Stairs             Wheelchair Mobility    Modified Rankin (Stroke Patients Only)       Balance Overall balance assessment: Needs assistance Sitting-balance support: No upper extremity supported;Feet supported Sitting balance-Leahy Scale: Good Sitting balance - Comments: steady sitting reaching within BOS     Standing balance-Leahy Scale: Fair Standing balance comment: steady static standing briefly                            Cognition Arousal/Alertness: Awake/alert Behavior During Therapy: WFL for tasks assessed/performed Overall Cognitive Status: Within Functional Limits for tasks assessed  Exercises      General Comments  Nursing cleared pt for participation in physical therapy.  Pt agreeable to PT session.      Pertinent Vitals/Pain Pain Assessment: No/denies pain Pain Location: R LE Pain Intervention(s): Limited activity within patient's tolerance;Monitored during session;Repositioned;Premedicated before session Vitals (HR and O2 on room  air) stable and WFL throughout treatment session.    Home Living                      Prior Function            PT Goals (current goals can now be found in the care plan section) Acute Rehab PT Goals Patient Stated Goal: to go home PT Goal Formulation: With patient Time For Goal Achievement: 01/18/21 Potential to Achieve Goals: Fair Progress towards PT goals: Progressing toward goals    Frequency    Min 2X/week      PT Plan Current plan remains appropriate    Co-evaluation              AM-PAC PT "6 Clicks" Mobility   Outcome Measure  Help needed turning from your back to your side while in a flat bed without using bedrails?: None Help needed moving from lying on your back to sitting on the side of a flat bed without using bedrails?: A Little Help needed moving to and from a bed to a chair (including a wheelchair)?: A Little Help needed standing up from a chair using your arms (e.g., wheelchair or bedside chair)?: A Little Help needed to walk in hospital room?: A Little Help needed climbing 3-5 steps with a railing? : A Lot 6 Click Score: 18    End of Session Equipment Utilized During Treatment: Gait belt Activity Tolerance: Patient tolerated treatment well Patient left: in chair;with call bell/phone within reach;with chair alarm set;Other (comment) (Vascular PA present) Nurse Communication: Mobility status;Precautions PT Visit Diagnosis: Unsteadiness on feet (R26.81);Other abnormalities of gait and mobility (R26.89);Muscle weakness (generalized) (M62.81);History of falling (Z91.81);Difficulty in walking, not elsewhere classified (R26.2)     Time: 3785-8850 PT Time Calculation (min) (ACUTE ONLY): 41 min  Charges:  $Gait Training: 8-22 mins $Therapeutic Activity: 23-37 mins                     Hendricks Limes, PT 01/09/21, 11:37 AM

## 2021-01-09 NOTE — Discharge Summary (Signed)
[facility]    Discharge Summary  Patient ID:  Jody Lowe MRN: [id number] DOB/AGE: [birth year] 77 y.o.  Admit date: Jan 09, 2021 Discharge date: 01/09/2021 Date of Surgery: 09-Jan-2021 Surgeon: Surgeon(s): Dew, Marlow Baars, MD Schnier, Latina Craver, MD  Admission Diagnosis: Atherosclerosis of artery of extremity with ulceration (HCC) [I70.299, L97.909]  Discharge Diagnoses:  Atherosclerosis of artery of extremity with ulceration (HCC) [I70.299, L97.909]  Secondary Diagnoses: Past Medical History:  Diagnosis Date   Anemia    Hypertension    Peripheral artery disease (HCC)    Renal disorder    Procedure(s): January 09, 2021: Right common femoral artery and profunda femoris artery endarterectomy with patch angioplasty. Right femoral artery to below-the-knee popliteal artery bypass with in-situ saphenous vein graft. Redo operation right femoral artery.  Discharged Condition: Good  HPI / Hospital Course:  Jody Lowe is a 75 year old female who presents with atherosclerotic occlusive disease with ulceration and nonhealing wounds on the right foot.  She had previous SFA intervention with an extensive stent fracture that precluded endovascular revascularization and she also had disease in the common femoral artery and profunda femoris artery.  The patient required surgical bypass for revascularization.  Risks and benefits were discussed including but not limited to bleeding, infection, thrombosis, limb loss, injury to nearby structures, cardiopulmonary complications, and death. On 2021/01/09, the patient underwent:  Right common femoral artery and profunda femoris artery endarterectomy with patch angioplasty. Right femoral artery to below-the-knee popliteal artery bypass with in-situ saphenous vein graft. Redo operation right femoral artery.  The patient tolerated the procedure well was transferred from the operating room to the ICU for observation overnight.  The  patient's night of surgery was unremarkable.  Postop day #1, the patient was transferred out of the ICU to the surgical floor.  During her brief stay, the patient's diet was advanced, her Foley was removed and she was urinating on her own, her pain was controlled to the use of p.o. pain medication and she was ambulating with assistance.  During her stay, the patient was evaluated by both physical and Occupational Therapy who deemed it appropriate to be discharged to a skilled nursing facility for continued services.  Unfortunately, despite our best efforts (multiple conversations with the surgeon, midlevel, nursing, PT, OT and social work) the patient decided to be discharged home with home services.  Day of discharge, the patient was afebrile with stable vital signs.  Her right lower extremity was healing well.  Physical Exam:  A&Ox3, NAD CV: RRR Pulmonary: CTA Bilaterally Abdomen: Soft, Nontender, Nondistended Vascular:              Right lower extremity: Thigh soft.  Calf soft.  Foot is warm.  OR dressings [place] VAC removed.  Staples are clean, and dry.  Compression wrap still intact.  Motor/sensory is intact.  Neuro: Intact, no deficits  Labs: As below  Complications: None  Consults:  Physical Therapy Occasional Therapy Social Work  Significant Diagnostic Studies: CBC Lab Results  Component Value Date   WBC 12.2 (H) 01/09/2021   HGB 8.6 (L) 01/09/2021   HCT 26.5 (L) 01/09/2021   MCV 82.3 01/09/2021   PLT 497 (H) 01/09/2021   BMET    Component Value Date/Time   NA 130 (L) 01/09/2021 0414   K 3.8 01/09/2021 0414   CL 100 01/09/2021 0414   CO2 26 01/09/2021 0414   GLUCOSE 118 (H) 01/09/2021 0414   BUN 25 (H) 01/09/2021 0414   CREATININE 0.97 01/09/2021 0414  CALCIUM 8.5 (L) 01/09/2021 0414   GFRNONAA >60 01/09/2021 0414   GFRAA >60 09/20/2019 1312   COAG Lab Results  Component Value Date   INR 1.2 10/14/2020   INR 1.1 05/01/2019   Disposition:  Discharge to  :Home  Allergies as of 01/09/2021       Reactions   Gabapentin Other (See Comments)   Nervous, shaky   Plavix [clopidogrel Bisulfate] Rash        Medication List     STOP taking these medications    Oxycodone HCl 10 MG Tabs       TAKE these medications    albuterol 108 (90 Base) MCG/ACT inhaler Commonly known as: VENTOLIN HFA Inhale 1-2 puffs into the lungs every 6 (six) hours as needed for wheezing or shortness of breath.   alendronate 70 MG tablet Commonly known as: FOSAMAX Take 70 mg by mouth once a week.   amitriptyline 25 MG tablet Commonly known as: ELAVIL Take 25 mg by mouth at bedtime.   aspirin EC 81 MG tablet Take 81 mg by mouth daily.   atorvastatin 20 MG tablet Commonly known as: LIPITOR Take 1 tablet (20 mg total) by mouth at bedtime.   cyanocobalamin 1000 MCG tablet Take 1,000 mcg by mouth every other day.   D3-1000 25 MCG (1000 UT) capsule Generic drug: Cholecalciferol Take 1,000 Units by mouth daily.   GNP Vitamin D 25 MCG (1000 UT) tablet Generic drug: Cholecalciferol Take 1,000 Units by mouth daily.   diclofenac Sodium 1 % Gel Commonly known as: VOLTAREN Apply topically 4 (four) times daily as needed.   esomeprazole 40 MG capsule Commonly known as: NEXIUM Take 40 mg by mouth daily.   ferrous sulfate 325 (65 FE) MG tablet Take 325 mg by mouth every other day.   FLUoxetine 40 MG capsule Commonly known as: PROZAC Take 2 capsules (80 mg total) by mouth daily. Take one capsule (40 mg) by mouth once daily for one month, then increase to two capsules (80 mg) thereafter   fluticasone 50 MCG/ACT nasal spray Commonly known as: FLONASE Place 1 spray into both nostrils daily as needed for allergies or rhinitis.   ipratropium 0.03 % nasal spray Commonly known as: ATROVENT Place 2 sprays into both nostrils 3 (three) times daily as needed for rhinitis.   meclizine 25 MG tablet Commonly known as: ANTIVERT Take 1 tablet (25 mg total) by  mouth 3 (three) times daily as needed for dizziness or nausea.   nystatin powder Commonly known as: MYCOSTATIN/NYSTOP Apply 1 application topically 2 (two) times daily as needed.   oxybutynin 10 MG 24 hr tablet Commonly known as: DITROPAN-XL Take 1 tablet by mouth daily.   oxyCODONE-acetaminophen 5-325 MG tablet Commonly known as: PERCOCET/ROXICET Take 1-2 tablets by mouth every 6 (six) hours as needed for moderate pain or severe pain.   polyethylene glycol 17 g packet Commonly known as: MIRALAX / GLYCOLAX Take 17 g by mouth daily as needed for mild constipation.   prasugrel 10 MG Tabs tablet Commonly known as: EFFIENT Take 1 tablet (10 mg total) by mouth daily.   senna-docusate 8.6-50 MG tablet Commonly known as: Senokot-S Take 2 tablets by mouth daily as needed for mild constipation.       Verbal and written Discharge instructions given to the patient. Wound care per Discharge AVS  Contact information for follow-up providers     Georgiana Spinner, NP Follow up in 2 week(s).   Specialty: Vascular Surgery Why: First postop  incision check. No studies. To see Vivia Birmingham. Contact information: [address] [place] [address] [phone]              Contact information for after-discharge care     Destination     [facility] Preferred SNF .   Service: Skilled Nursing Contact information: [address] [place] [address] [phone]                    Signed: Tonette Lederer, PA-C 01/09/2021, 11:23 AM

## 2021-01-09 NOTE — Progress Notes (Signed)
Initial Nutrition Assessment  DOCUMENTATION CODES:  Obesity unspecified  INTERVENTION:  Liberalize diet to regular to promote PO intake Continue Ensure Enlive po BID, each supplement provides 350 kcal and 20 grams of protein Juven BID, each packet provides 95 calories, 2.5 grams of protein (collagen), and 9.8 grams of carbohydrate (3 grams sugar); also contains 7 grams of L-arginine and L-glutamine, 300 mg vitamin C, 15 mg vitamin E, 1.2 mcg vitamin B-12, 9.5 mg zinc, 200 mg calcium, and 1.5 g  Calcium Beta-hydroxy-Beta-methylbutyrate to support wound healing  NUTRITION DIAGNOSIS:  Increased nutrient needs related to wound healing as evidenced by estimated needs.  GOAL:  Patient will meet greater than or equal to 90% of their needs  MONITOR:  PO intake, Supplement acceptance, Skin  REASON FOR ASSESSMENT:  Consult Wound healing  ASSESSMENT:  75 year old female with hx of HTN, CK, PVD, and anemia presented to [facility] for planned vascular procedure.  Underwent APPLICATION OF CELL SAVER and BYPASS GRAFT FEMORAL-TIBIAL ARTERY (Right) VEIN HARVEST 10/12  Pt resting in bedside chair at the time of visit. Breakfast tray noted to be poorly consumed at bedside. Lunch arrived during assessment. Pt reports that she does not like the food that she is being given - which is contributing to her poor intake. At baseline, intake sounds inadequate. Pt reports that she receives meals on wheels for her mid-day meal. This is the only true meal she consumes during the day, the rest of the time she said she will eat small items/snacks. Drinks a variety of sugary beverages throughout the day.   Discussed the importance of adequate nutrition with wound healing. Identified sources of protein in the diet. Pt reports she likes peanut butter and will eat ritz crackers with peanut butter sometimes. Encouraged pt to get a good source of protein 3x/d. Pt reports that she has been receiving ensure this admission and does  like them. Will continue while pt is admitted and also recommend liberalizing diet to allow for more dining options.   Pt states she is going home today but that her family is upset with her because they are worried about her ability to take care of herself. Therapies have recommended SNF. Weight stable per pt at ~210 lb.   Average Meal Intake: 10/13-10/17: 54% intake x 7 recorded meals  Nutritionally Relevant Medications: Scheduled Meds:  atorvastatin  20 mg Oral QHS   cholecalciferol  1,000 Units Oral Daily   docusate sodium  100 mg Oral Daily   famotidine  20 mg Oral BID   feeding supplement  237 mL Oral BID BM   ferrous sulfate  325 mg Oral QODAY   pantoprazole  40 mg Oral Daily   cyanocobalamin  1,000 mcg Oral QODAY   Continuous Infusions:  sodium chloride     magnesium sulfate bolus IVPB 2 g (01/09/21 1050)   PRN Meds: alum & mag hydroxide-simeth, fluticasone, ondansetron, phenol, polyethylene glycol, potassium chloride, senna-docusate, sorbitol  Labs Reviewed: Na 130 BUN 25  NUTRITION - FOCUSED PHYSICAL EXAM: Flowsheet Row Most Recent Value  Orbital Region No depletion  Upper Arm Region No depletion  Thoracic and Lumbar Region No depletion  Buccal Region No depletion  Temple Region Mild depletion  Clavicle Bone Region No depletion  Clavicle and Acromion Bone Region No depletion  Scapular Bone Region No depletion  Dorsal Hand No depletion  Patellar Region No depletion  Anterior Thigh Region No depletion  Posterior Calf Region No depletion  Edema (RD Assessment) Moderate  Hair Reviewed  Eyes Reviewed  Mouth Reviewed  Skin Reviewed  Nails Reviewed   Diet Order:   Diet Order             Diet regular Room service appropriate? Yes; Fluid consistency: Thin  Diet effective now                   EDUCATION NEEDS:  Education needs have been addressed  Skin:  Skin Assessment: Reviewed RN Assessment (surgical incisions, right foot/groin/leg)  Last BM:   10/11  Height:  Ht Readings from Last 1 Encounters:  01/03/21 5\' 6"  (1.676 m)   Weight:  Wt Readings from Last 1 Encounters:  01/03/21 96.3 kg    Ideal Body Weight:  59.1 kg  BMI:  Body mass index is 34.27 kg/m.  Estimated Nutritional Needs:  Kcal:  1900-2100 kcal/d Protein:  85-100 g/d Fluid:  2-2.2 L/d   03/05/21, RD, LDN Clinical Dietitian RD pager # available in [facility]  After hours/weekend pager # available in [facility]

## 2021-01-09 NOTE — Discharge Instructions (Addendum)
Vascular Surgery Discharge Instructions:  1) You may shower. Please keep your incisions clean and dry. Gently clean your incisions with soap and water. Gently pat dry. 2) Please do not engage in strenuous activity or lifting greater than 10 pounds until you are cleared at one your first postoperative follow-ups. 3) Please do not drive for approximately 2 weeks.

## 2021-01-10 ENCOUNTER — Telehealth (INDEPENDENT_AMBULATORY_CARE_PROVIDER_SITE_OTHER): Payer: Self-pay

## 2021-01-11 ENCOUNTER — Ambulatory Visit: Payer: Medicare Other | Admitting: Medical

## 2021-01-11 NOTE — Progress Notes (Deleted)
Cardiology Office Note:    Date:  01/11/2021   ID:  Jody Lowe, DOB June 01, 1945, MRN [id number]  PCP:  Idelia Salm, MD  [facility] Cardiologist:  None  [facility] Electrophysiologist:  None   Referring MD: Idelia Salm, *   Chief Complaint: 1 month follow-up  History of Present Illness:    Jody Lowe is a 75 y.o. female with a hx of PVD, hypertension, anemia, tobacco abuse.  She is previously hospitalized in July, initially for syncope in the setting of hypothermia complicated by severe leg pain in the setting of PAD status post left lower extremity intervention.  She was rehospitalized less than a week later due to generalized weakness and severe bilateral leg pain.  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  She was seen in clinic 12/06/2020 for preoperative cardiovascular risk assessment for critical limb ischemia and possible femoral-popliteal bypass.  Echo in July showed normal LVEF without wall motion abnormality.  She reported shortness of breath, and she had mild troponin elevation at the time of her hospitalization in July.  Given this a Myoview Lexiscan was ordered.  Study was low risk overall normal with no evidence of ischemia.  She was cleared for surgery.  Today,  Past Medical History:  Diagnosis Date   Anemia    Hypertension    Peripheral artery disease (HCC)    Renal disorder     Past Surgical History:  Procedure Laterality Date   BREAST REDUCTION SURGERY     CARDIAC CATHETERIZATION     CATARACT EXTRACTION W/PHACO Left 11/11/2019   Procedure: CATARACT EXTRACTION PHACO AND INTRAOCULAR LENS PLACEMENT (IOC) LEFT VISION BLUE;  Surgeon: Elliot Cousin, MD;  Location: [facility];  Service: Ophthalmology;  Laterality: Left;  7.94 0:42.4   CATARACT EXTRACTION W/PHACO Right 12/23/2019   Procedure: CATARACT EXTRACTION PHACO AND INTRAOCULAR LENS PLACEMENT (IOC) RIGHT VISION BLUE 5.47   00:31.1;  Surgeon: Elliot Cousin, MD;  Location: [facility];  Service: Ophthalmology;  Laterality: Right;   FEMORAL-TIBIAL BYPASS GRAFT Right 01/03/2021   Procedure: BYPASS GRAFT FEMORAL-TIBIAL ARTERY;  Surgeon: Annice Needy, MD;  Location: [facility] OR[facility];  Service: Vascular;  Laterality: Right;   LOWER EXTREMITY ANGIOGRAPHY N/A 10/16/2020   Procedure: Lower Extremity Angiography;  Surgeon: Annice Needy, MD;  Location: [facility] IN[facility]AS[facility] CV [facility];  Service: Cardiovascular;  Laterality: N/A;   LOWER EXTREMITY ANGIOGRAPHY Right 10/23/2020   Procedure: LOWER EXTREMITY ANGIOGRAPHY;  Surgeon: Annice Needy, MD;  Location: [facility] INVAS[facility] CV LAB;  Service: Cardiovascular;  Laterality: Right;   VEIN HARVEST Right 01/03/2021   Procedure: VEIN HARVEST;  Surgeon: Annice Needy, MD;  Location: [facility];  Service: Vascular;  Laterality: Right;    Current Medications: No outpatient medications have been marked as taking for the 01/11/21 encounter (Appointment) with Fransico Michael, Wilhelmena Zea H, PA-C.     Allergies:   Gabapentin and Plavix [clopidogrel bisulfate]   Social History   Socioeconomic History   Marital status: Single    Spouse name: Not on file   Number of children: Not on file   Years of education: Not on file   Highest education level: Not on file  Occupational History   Not on file  Tobacco Use   Smoking status: Some Days    Packs/day: 0.10    Types: Cigarettes   Smokeless tobacco: Never  Vaping Use   Vaping Use: Former  Substance and Sexual Activity   Alcohol use: No  Drug use: No   Sexual activity: Not on file  Other Topics Concern   Not on file  Social History Narrative   Not on file   Social Determinants of Health   Financial Resource Strain: Not on file  Food Insecurity: Not on file  Transportation Needs: Not on file  Physical Activity: Not on file  Stress: Not on file  Social Connections: Not on file     Family History: The patient's family history includes Heart attack in  her mother; Heart disease in her sister; Hypertension in her sister.  ROS:   Please see the history of present illness.     All other systems reviewed and are negative.  EKGs/Labs/Other Studies Reviewed:    The following studies were reviewed today:  Echo 09/2020  1. Left ventricular ejection fraction, by estimation, is 60 to 65%. The  left ventricle has normal function. The left ventricle has no regional  wall motion abnormalities. Left ventricular diastolic parameters are  consistent with Grade I diastolic  dysfunction (impaired relaxation).   2. Right ventricular systolic function is normal. The right ventricular  size is normal. There is normal pulmonary artery systolic pressure.   3. The mitral valve is normal in structure. Mild mitral valve  regurgitation. No evidence of mitral stenosis.   4. The aortic valve is normal in structure. Aortic valve regurgitation is  not visualized. No aortic stenosis is present.   5. The inferior vena cava is normal in size with greater than 50%  respiratory variability, suggesting right atrial pressure of 3 mmHg.   Myoview Lexiscan 11/2020   The study is normal. The study is low risk.   No ST deviation was noted.   LV perfusion is normal. There is no evidence of ischemia. There is no evidence of infarction.   Left ventricular function is normal. End diastolic cavity size is normal. End systolic cavity size is normal.   CT attenuation images showed evidence of aortic and coronary calcifications    EKG:  EKG is *** ordered today.  The ekg ordered today demonstrates ***  Recent Labs: 10/14/2020: TSH 0.777 10/15/2020: ALT 11 01/09/2021: BUN 25; Creatinine, Ser 0.97; Hemoglobin 8.6; Magnesium 1.7; Platelets 497; Potassium 3.8; Sodium 130  Recent Lipid Panel    Component Value Date/Time   CHOL 89 10/16/2020 0601   TRIG 42 10/16/2020 0601   HDL 40 (L) 10/16/2020 0601   CHOLHDL 2.2 10/16/2020 0601   VLDL 8 10/16/2020 0601   LDLCALC 41  10/16/2020 0601     Risk Assessment/Calculations:   {Does this patient have ATRIAL FIBRILLATION?:[id number]}   Physical Exam:    VS:  There were no vitals taken for this visit.    Wt Readings from Last 3 Encounters:  01/03/21 212 lb 4.9 oz (96.3 kg)  12/28/20 206 lb (93.4 kg)  12/06/20 206 lb (93.4 kg)     GEN: *** Well nourished, well developed in no acute distress HEENT: Normal NECK: No JVD; No carotid bruits LYMPHATICS: No lymphadenopathy CARDIAC: ***RRR, no murmurs, rubs, gallops RESPIRATORY:  Clear to auscultation without rales, wheezing or rhonchi  ABDOMEN: Soft, non-tender, non-distended MUSCULOSKELETAL:  No edema; No deformity  SKIN: Warm and dry NEUROLOGIC:  Alert and oriented x 3 PSYCHIATRIC:  Normal affect   ASSESSMENT:    No diagnosis found. PLAN:    In order of problems listed above:  PAD     Disposition: Follow up {follow up:15908} with ***   Shared Decision Making/Informed Consent   {Are  you ordering a CV Procedure (e.g. stress test, cath, DCCV, TEE, etc)?   Press F2        :[id number]}    Signed, [place] Crance David Stall, PA-C  01/11/2021 7:57 AM    [facility]

## 2021-01-11 NOTE — Telephone Encounter (Signed)
Swelling is to be expected and elevation is the most helpful thing.  You can try an ace bandage to help with swelling just don't wrap too tight.  Ice is also ok

## 2021-01-11 NOTE — Telephone Encounter (Signed)
I spoke with Jody Lowe and she was made aware with medical recommendations. Also [name] informed that the patient right leg has some watery oozing near the groin and knee. She will be going to the patient home tomorrow to evaluate and will call if there is any changes.

## 2021-01-12 ENCOUNTER — Other Ambulatory Visit (INDEPENDENT_AMBULATORY_CARE_PROVIDER_SITE_OTHER): Payer: Self-pay | Admitting: Nurse Practitioner

## 2021-01-12 ENCOUNTER — Encounter: Payer: Self-pay | Admitting: Cardiology

## 2021-01-12 MED ORDER — CEPHALEXIN 500 MG PO CAPS: 500.0000 mg | ORAL_CAPSULE | Freq: Three times a day (TID) | ORAL | 0 refills | Status: DC

## 2021-01-12 NOTE — Telephone Encounter (Signed)
The patient has had a massive surgery and extensive swelling is expected.  With swelling comes warmth and redness.  Watery, yellow discharge is expected. We can send her antibiotics is there is concern for infection.  Otherwise, she can present to the ED for evaluation if it worsens over the weekend

## 2021-01-12 NOTE — Telephone Encounter (Signed)
Jody Lowe from [place] has being made aware with medical recommendations and will make the patient aware with medical advice.

## 2021-01-15 ENCOUNTER — Telehealth (INDEPENDENT_AMBULATORY_CARE_PROVIDER_SITE_OTHER): Payer: Self-pay

## 2021-01-15 NOTE — Telephone Encounter (Signed)
The pt called and left a VM on the nurses line wanting to know when her FU appointment was from her procedure, I called both numbers listed for  the pt and got no answer an was not able to leave a VM per the answering machine on both lines.

## 2021-01-16 ENCOUNTER — Telehealth (INDEPENDENT_AMBULATORY_CARE_PROVIDER_SITE_OTHER): Payer: Self-pay

## 2021-01-16 NOTE — Telephone Encounter (Signed)
Patient stated that the drainage is coming from the groin area. The drainage is pale pink color with some odor. The patient has not started antibiotics but her son is going today to pick up the prescription that was sent on 01/12/21

## 2021-01-16 NOTE — Telephone Encounter (Signed)
Ok

## 2021-01-16 NOTE — Telephone Encounter (Signed)
Where is the drainage coming from ? The lower leg is a little more re assuring.  Or is it the groin? If it is yellow without smell that is part of the healing process but if it looks like pus that is worrisome.  She should be sore, she had major surgery.  She will be sore for a while.  The staples are going into her skin.  They are holding it together.  The redness may be from irritation from the staples

## 2021-01-19 ENCOUNTER — Ambulatory Visit (INDEPENDENT_AMBULATORY_CARE_PROVIDER_SITE_OTHER): Payer: Medicare Other | Admitting: Nurse Practitioner

## 2021-01-19 ENCOUNTER — Encounter (INDEPENDENT_AMBULATORY_CARE_PROVIDER_SITE_OTHER): Payer: Self-pay | Admitting: Nurse Practitioner

## 2021-01-19 ENCOUNTER — Other Ambulatory Visit: Payer: Self-pay

## 2021-01-19 VITALS — BP 100/62 | HR 89 | Resp 16

## 2021-01-19 DIAGNOSIS — F17200 Nicotine dependence, unspecified, uncomplicated: Secondary | ICD-10-CM

## 2021-01-19 DIAGNOSIS — I7025 Atherosclerosis of native arteries of other extremities with ulceration: Secondary | ICD-10-CM

## 2021-01-20 ENCOUNTER — Encounter (INDEPENDENT_AMBULATORY_CARE_PROVIDER_SITE_OTHER): Payer: Self-pay | Admitting: Nurse Practitioner

## 2021-01-20 NOTE — Progress Notes (Signed)
Subjective:    Patient ID: Jody Lowe, female    DOB: 1945-05-23, 75 y.o.   MRN: [id number] Chief Complaint  Patient presents with   Routine Post Op    [facility] 2wk post op incision check    Jody Lowe is a 75 year old female that returns today following intervention on her right lower extremity including:  PROCEDURE: 1.   Redo right femoral artery to below-the-knee popliteal artery bypass with in-situ saphenous vein graft. 2. Right common femoral and profunda femoris artery endarterectomy with CorMatrix patch angioplasty. 3. Redo right bypass surgery  She has extensive swelling in the right lower extremity however leg is warm with a palpable pulse.  The previous ulceration on the right foot is much improved however there is a new ulceration at the medial portion that is very painful for the patient.  Her base complaint is of the swelling and some notable drainage.  The patient was prescribed antibiotics however she did not begin taking them until approximately a week after they were prescribed.  She still has several days left on the antibiotics.  Today no evidence of infection is noted with no odor or significant drainage.  There are some small areas of dehiscence at the incision to the groin as well as one down the lower leg.  He denies any fevers or chills.   Review of Systems  Cardiovascular:  Positive for leg swelling.  Skin:  Positive for wound.  All other systems reviewed and are negative.     Objective:   Physical Exam Vitals reviewed.  HENT:     Head: Normocephalic.  Cardiovascular:     Rate and Rhythm: Normal rate.     Pulses:          Dorsalis pedis pulses are 1+ on the right side.  Pulmonary:     Effort: Pulmonary effort is normal.  Skin:    General: Skin is warm and dry.     Findings: Wound present.  Neurological:     Mental Status: She is alert and oriented to person, place, and time.  Psychiatric:        Mood and Affect: Mood normal.        Behavior:  Behavior normal.        Thought Content: Thought content normal.        Judgment: Judgment normal.    BP 100/62 (BP Location: Right Arm)   Pulse 89   Resp 16   Past Medical History:  Diagnosis Date   Anemia    Hypertension    Peripheral artery disease (HCC)    Renal disorder     Social History   Socioeconomic History   Marital status: Single    Spouse name: Not on file   Number of children: Not on file   Years of education: Not on file   Highest education level: Not on file  Occupational History   Not on file  Tobacco Use   Smoking status: Some Days    Packs/day: 0.10    Types: Cigarettes   Smokeless tobacco: Never  Vaping Use   Vaping Use: Former  Substance and Sexual Activity   Alcohol use: No   Drug use: No   Sexual activity: Not on file  Other Topics Concern   Not on file  Social History Narrative   Not on file   Social Determinants of Health   Financial Resource Strain: Not on file  Food Insecurity: Not on file  Transportation Needs: Not on  file  Physical Activity: Not on file  Stress: Not on file  Social Connections: Not on file  Intimate Partner Violence: Not on file    Past Surgical History:  Procedure Laterality Date   BREAST REDUCTION SURGERY     CARDIAC CATHETERIZATION     CATARACT EXTRACTION W/PHACO Left 11/11/2019   Procedure: CATARACT EXTRACTION PHACO AND INTRAOCULAR LENS PLACEMENT (IOC) LEFT VISION BLUE;  Surgeon: Elliot Cousin, MD;  Location: [facility];  Service: Ophthalmology;  Laterality: Left;  7.94 0:42.4   CATARACT EXTRACTION W/PHACO Right 12/23/2019   Procedure: CATARACT EXTRACTION PHACO AND INTRAOCULAR LENS PLACEMENT (IOC) RIGHT VISION BLUE 5.47  00:31.1;  Surgeon: Elliot Cousin, MD;  Location: [facility];  Service: Ophthalmology;  Laterality: Right;   FEMORAL-TIBIAL BYPASS GRAFT Right 01/03/2021   Procedure: BYPASS GRAFT FEMORAL-TIBIAL ARTERY;  Surgeon: Annice Needy, MD;  Location: [facility];  Service: Vascular;   Laterality: Right;   LOWER EXTREMITY ANGIOGRAPHY N/A 10/16/2020   Procedure: Lower Extremity Angiography;  Surgeon: Annice Needy, MD;  Location: [facility];  Service: Cardiovascular;  Laterality: N/A;   LOWER EXTREMITY ANGIOGRAPHY Right 10/23/2020   Procedure: LOWER EXTREMITY ANGIOGRAPHY;  Surgeon: Annice Needy, MD;  Location: [facility];  Service: Cardiovascular;  Laterality: Right;   VEIN HARVEST Right 01/03/2021   Procedure: VEIN HARVEST;  Surgeon: Annice Needy, MD;  Location: [facility];  Service: Vascular;  Laterality: Right;    Family History  Problem Relation Age of Onset   Heart attack Mother    Heart disease Sister    Hypertension Sister     Allergies  Allergen Reactions   Gabapentin Other (See Comments)    Nervous, shaky   Plavix [Clopidogrel Bisulfate] Rash    CBC Latest Ref Rng & Units 01/09/2021 01/08/2021 01/07/2021  WBC 4.0 - 10.5 K/uL 12.2(H) 12.6(H) -  Hemoglobin 12.0 - 15.0 g/dL 5.0(P) 8.9(L) 8.7(L)  Hematocrit 36.0 - 46.0 % 26.5(L) 26.2(L) 25.6(L)  Platelets 150 - 400 K/uL 497(H) 455(H) -      CMP     Component Value Date/Time   NA 130 (L) 01/09/2021 0414   K 3.8 01/09/2021 0414   CL 100 01/09/2021 0414   CO2 26 01/09/2021 0414   GLUCOSE 118 (H) 01/09/2021 0414   BUN 25 (H) 01/09/2021 0414   CREATININE 0.97 01/09/2021 0414   CALCIUM 8.5 (L) 01/09/2021 0414   PROT 6.3 (L) 10/15/2020 0250   ALBUMIN 2.9 (L) 10/15/2020 0250   AST 24 10/15/2020 0250   ALT 11 10/15/2020 0250   ALKPHOS 86 10/15/2020 0250   BILITOT 0.7 10/15/2020 0250   GFRNONAA >60 01/09/2021 0414   GFRAA >60 09/20/2019 1312     No results found.     Assessment & Plan:   1. Atherosclerosis of native arteries of the extremities with ulceration (HCC) Some staples removed today but due to extensive swelling staples remain in place.  The patient is advised to clean the drainage around some of the staples with some hydrogen peroxide on a Q-tip some staples during  the groin area removed due to some dehiscence.  Aquacel was placed on the wound and we will update wound care orders with home health care nurse.  Consult was also placed on the patient's foot.  The patient has his what appears to be a new ulcerated area on her foot that is painful in nature.  Will refer to podiatry for evaluation.  Otherwise we will have her return in  2 weeks to remove the rest of the staples.  2. Tobacco use disorder Smoking cessation was discussed, 3-10 minutes spent on this topic specifically    Current Outpatient Medications on File Prior to Visit  Medication Sig Dispense Refill   albuterol (VENTOLIN HFA) 108 (90 Base) MCG/ACT inhaler Inhale 1-2 puffs into the lungs every 6 (six) hours as needed for wheezing or shortness of breath.     alendronate (FOSAMAX) 70 MG tablet Take 70 mg by mouth once a week.     amitriptyline (ELAVIL) 25 MG tablet Take 25 mg by mouth at bedtime.     aspirin EC 81 MG tablet Take 81 mg by mouth daily.     atorvastatin (LIPITOR) 20 MG tablet Take 1 tablet (20 mg total) by mouth at bedtime. 30 tablet 0   cephALEXin (KEFLEX) 500 MG capsule Take 1 capsule (500 mg total) by mouth 3 (three) times daily. 21 capsule 0   Cholecalciferol (D3-1000) 25 MCG (1000 UT) capsule Take 1,000 Units by mouth daily.     cyanocobalamin 1000 MCG tablet Take 1,000 mcg by mouth every other day.     diclofenac Sodium (VOLTAREN) 1 % GEL Apply topically 4 (four) times daily as needed.     esomeprazole (NEXIUM) 40 MG capsule Take 40 mg by mouth daily.     ferrous sulfate 325 (65 FE) MG tablet Take 325 mg by mouth every other day.     FLUoxetine (PROZAC) 40 MG capsule Take 2 capsules (80 mg total) by mouth daily. Take one capsule (40 mg) by mouth once daily for one month, then increase to two capsules (80 mg) thereafter  3   fluticasone (FLONASE) 50 MCG/ACT nasal spray Place 1 spray into both nostrils daily as needed for allergies or rhinitis.     GNP VITAMIN D 25 MCG (1000 UT)  tablet Take 1,000 Units by mouth daily.     ipratropium (ATROVENT) 0.03 % nasal spray Place 2 sprays into both nostrils 3 (three) times daily as needed for rhinitis.     meclizine (ANTIVERT) 25 MG tablet Take 1 tablet (25 mg total) by mouth 3 (three) times daily as needed for dizziness or nausea. 30 tablet 1   nystatin (MYCOSTATIN/NYSTOP) powder Apply 1 application topically 2 (two) times daily as needed.     oxybutynin (DITROPAN-XL) 10 MG 24 hr tablet Take 1 tablet by mouth daily.     oxyCODONE-acetaminophen (PERCOCET/ROXICET) 5-325 MG tablet Take 1-2 tablets by mouth every 6 (six) hours as needed for moderate pain or severe pain. 45 tablet 0   polyethylene glycol (MIRALAX / GLYCOLAX) 17 g packet Take 17 g by mouth daily as needed for mild constipation.     prasugrel (EFFIENT) 10 MG TABS tablet Take 1 tablet (10 mg total) by mouth daily. 30 tablet 0   senna-docusate (SENOKOT-S) 8.6-50 MG tablet Take 2 tablets by mouth daily as needed for mild constipation.     No current facility-administered medications on file prior to visit.    There are no Patient Instructions on file for this visit. No follow-ups on file.   Georgiana Spinner, NP

## 2021-01-22 ENCOUNTER — Encounter (INDEPENDENT_AMBULATORY_CARE_PROVIDER_SITE_OTHER): Payer: Medicare Other | Admitting: Nurse Practitioner

## 2021-01-22 ENCOUNTER — Telehealth (INDEPENDENT_AMBULATORY_CARE_PROVIDER_SITE_OTHER): Payer: Self-pay

## 2021-01-22 NOTE — Telephone Encounter (Signed)
Aquacell in groin area to change every 2-3 days.  Dry dressings to lower wounds as needed.  Aquacell to foot wound.  Can use calcium alginate if no aquacell available.

## 2021-01-22 NOTE — Telephone Encounter (Signed)
Verbal orders were received by home health nurse

## 2021-01-25 ENCOUNTER — Other Ambulatory Visit: Payer: Self-pay

## 2021-01-25 ENCOUNTER — Encounter: Payer: Self-pay | Admitting: Podiatry

## 2021-01-25 ENCOUNTER — Ambulatory Visit (INDEPENDENT_AMBULATORY_CARE_PROVIDER_SITE_OTHER): Payer: Medicare Other | Admitting: Podiatry

## 2021-01-25 DIAGNOSIS — L97512 Non-pressure chronic ulcer of other part of right foot with fat layer exposed: Secondary | ICD-10-CM | POA: Diagnosis not present

## 2021-01-25 DIAGNOSIS — I739 Peripheral vascular disease, unspecified: Secondary | ICD-10-CM

## 2021-01-25 NOTE — Progress Notes (Signed)
Subjective:  Patient ID: Jody Lowe, female    DOB: 1945/08/25,  MRN: [id number]  Chief Complaint  Patient presents with   Wound Check    Right foot wound     75 y.o. female presents for wound care.  Patient presents with complaint of right first metatarsophalangeal joint wound.  Patient states been present for few months.  They have been keeping anything on it.  She has a history of vascular stents and severe vascular peripheral artery disease.  She is not a diabetic as her last A1c was 5.8.  She would like to discuss treatment options for this.  It is very painful to touch.  She wears regular shoes.  She denies any other acute complaints.   Review of Systems: Negative except as noted in the HPI. Denies N/V/F/Ch.  Past Medical History:  Diagnosis Date   Anemia    Hypertension    Peripheral artery disease (HCC)    Renal disorder     Current Outpatient Medications:    albuterol (VENTOLIN HFA) 108 (90 Base) MCG/ACT inhaler, Inhale 1-2 puffs into the lungs every 6 (six) hours as needed for wheezing or shortness of breath., Disp: , Rfl:    alendronate (FOSAMAX) 70 MG tablet, Take 70 mg by mouth once a week., Disp: , Rfl:    amitriptyline (ELAVIL) 25 MG tablet, Take 25 mg by mouth at bedtime., Disp: , Rfl:    aspirin EC 81 MG tablet, Take 81 mg by mouth daily., Disp: , Rfl:    atorvastatin (LIPITOR) 20 MG tablet, Take 1 tablet (20 mg total) by mouth at bedtime., Disp: 30 tablet, Rfl: 0   cephALEXin (KEFLEX) 500 MG capsule, Take 1 capsule (500 mg total) by mouth 3 (three) times daily., Disp: 21 capsule, Rfl: 0   Cholecalciferol (D3-1000) 25 MCG (1000 UT) capsule, Take 1,000 Units by mouth daily., Disp: , Rfl:    cyanocobalamin 1000 MCG tablet, Take 1,000 mcg by mouth every other day., Disp: , Rfl:    diclofenac Sodium (VOLTAREN) 1 % GEL, Apply topically 4 (four) times daily as needed., Disp: , Rfl:    esomeprazole (NEXIUM) 40 MG capsule, Take 40 mg by mouth daily., Disp: , Rfl:     ferrous sulfate 325 (65 FE) MG tablet, Take 325 mg by mouth every other day., Disp: , Rfl:    FLUoxetine (PROZAC) 40 MG capsule, Take 2 capsules (80 mg total) by mouth daily. Take one capsule (40 mg) by mouth once daily for one month, then increase to two capsules (80 mg) thereafter, Disp: , Rfl: 3   fluticasone (FLONASE) 50 MCG/ACT nasal spray, Place 1 spray into both nostrils daily as needed for allergies or rhinitis., Disp: , Rfl:    GNP VITAMIN D 25 MCG (1000 UT) tablet, Take 1,000 Units by mouth daily., Disp: , Rfl:    ipratropium (ATROVENT) 0.03 % nasal spray, Place 2 sprays into both nostrils 3 (three) times daily as needed for rhinitis., Disp: , Rfl:    meclizine (ANTIVERT) 25 MG tablet, Take 1 tablet (25 mg total) by mouth 3 (three) times daily as needed for dizziness or nausea., Disp: 30 tablet, Rfl: 1   nystatin (MYCOSTATIN/NYSTOP) powder, Apply 1 application topically 2 (two) times daily as needed., Disp: , Rfl:    oxybutynin (DITROPAN-XL) 10 MG 24 hr tablet, Take 1 tablet by mouth daily., Disp: , Rfl:    oxyCODONE-acetaminophen (PERCOCET/ROXICET) 5-325 MG tablet, Take 1-2 tablets by mouth every 6 (six) hours as needed for  moderate pain or severe pain., Disp: 45 tablet, Rfl: 0   polyethylene glycol (MIRALAX / GLYCOLAX) 17 g packet, Take 17 g by mouth daily as needed for mild constipation., Disp: , Rfl:    prasugrel (EFFIENT) 10 MG TABS tablet, Take 1 tablet (10 mg total) by mouth daily., Disp: 30 tablet, Rfl: 0   senna-docusate (SENOKOT-S) 8.6-50 MG tablet, Take 2 tablets by mouth daily as needed for mild constipation., Disp: , Rfl:   Social History   Tobacco Use  Smoking Status Some Days   Packs/day: 0.10   Types: Cigarettes  Smokeless Tobacco Never    Allergies  Allergen Reactions   Gabapentin Other (See Comments)    Nervous, shaky   Plavix [Clopidogrel Bisulfate] Rash   Objective:  There were no vitals filed for this visit. There is no height or weight on file to  calculate BMI. Constitutional Well developed. Well nourished.  Vascular Dorsalis pedis pulses palpable bilaterally. Posterior tibial pulses palpable bilaterally. Capillary refill normal to all digits.  No cyanosis or clubbing noted. Pedal hair growth normal.  Neurologic Normal speech. Oriented to person, place, and time. Protective sensation absent  Dermatologic Wound Location: Right first MPJ medial wound with fat layer exposed.  Pain on palpation.  No purulent drainage noted no malodor present no erythema noted. Wound Base: Mixed Granular/Fibrotic Peri-wound: Normal Exudate: None: wound tissue dry Wound Measurements: -See below  Orthopedic: No pain to palpation either foot.   Radiographs: None Assessment:   1. Peripheral vascular disease (HCC)   2. 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  Ulcer right first MPJ wound with fat layer exposed -Debridement as below. -Dressed with Betadine wet-to-dry, DSD. -Continue off-loading with surgical shoe. -Patient has extensive history with right femoral artery to below the knee popliteal bypass with common femoral artery and profunda endarterectomy. -Given the nature of the wound, she may benefit from a wound care center I will discuss this further at next clinic visit if not improving.  Procedure: Excisional Debridement of Wound Tool: Sharp chisel blade/tissue nipper Rationale: Removal of non-viable soft tissue from the wound to promote healing.  Anesthesia: none Pre-Debridement Wound Measurements: 1 cm x 1 cm x 0.4 cm  Post-Debridement Wound Measurements: 1.1 cm x 1 cm x 0.4 cm  Type of Debridement: Sharp Excisional Tissue Removed: Non-viable soft tissue Blood loss: Minimal (<50cc) Depth of Debridement: subcutaneous tissue. Technique: Sharp excisional debridement to bleeding, viable wound base.  Wound Progress: This is my initial evaluation I  will continue to monitor the progression of the wound. Site healing conversation 7 Dressing: Dry, sterile, compression dressing. Disposition: Patient tolerated procedure well. Patient to return in 1 week for follow-up.  No follow-ups on file.

## 2021-01-29 ENCOUNTER — Other Ambulatory Visit (INDEPENDENT_AMBULATORY_CARE_PROVIDER_SITE_OTHER): Payer: Self-pay | Admitting: Nurse Practitioner

## 2021-01-29 DIAGNOSIS — Z9889 Other specified postprocedural states: Secondary | ICD-10-CM

## 2021-01-29 DIAGNOSIS — I70339 Atherosclerosis of unspecified type of bypass graft(s) of the right leg with ulceration of unspecified site: Secondary | ICD-10-CM

## 2021-02-01 ENCOUNTER — Ambulatory Visit (INDEPENDENT_AMBULATORY_CARE_PROVIDER_SITE_OTHER): Payer: Medicare Other | Admitting: Nurse Practitioner

## 2021-02-01 ENCOUNTER — Ambulatory Visit (INDEPENDENT_AMBULATORY_CARE_PROVIDER_SITE_OTHER): Payer: Medicare Other

## 2021-02-01 ENCOUNTER — Encounter (INDEPENDENT_AMBULATORY_CARE_PROVIDER_SITE_OTHER): Payer: Self-pay | Admitting: Nurse Practitioner

## 2021-02-01 ENCOUNTER — Other Ambulatory Visit: Payer: Self-pay

## 2021-02-01 VITALS — BP 123/67 | HR 91 | Resp 16

## 2021-02-01 DIAGNOSIS — F17200 Nicotine dependence, unspecified, uncomplicated: Secondary | ICD-10-CM

## 2021-02-01 DIAGNOSIS — E785 Hyperlipidemia, unspecified: Secondary | ICD-10-CM

## 2021-02-01 DIAGNOSIS — Z9889 Other specified postprocedural states: Secondary | ICD-10-CM | POA: Diagnosis not present

## 2021-02-01 DIAGNOSIS — I70339 Atherosclerosis of unspecified type of bypass graft(s) of the right leg with ulceration of unspecified site: Secondary | ICD-10-CM

## 2021-02-11 ENCOUNTER — Encounter (INDEPENDENT_AMBULATORY_CARE_PROVIDER_SITE_OTHER): Payer: Self-pay | Admitting: Nurse Practitioner

## 2021-02-11 NOTE — Progress Notes (Signed)
Subjective:    Patient ID: Jody Lowe, female    DOB: 1946/02/22, 75 y.o.   MRN: [id number] Chief Complaint  Patient presents with   Follow-up    2wk ultrasound follow up    Jody Lowe is a 75 year old female that returns today following recent bypass graft of the right lower extremity.  The patient notes some oozing from the incision to her groin but there is a decrease in swelling.  She recently saw podiatry who is treating the new ulceration on her foot.  Most of the wounds are healing well there is some small areas of dehiscence in the groin as well as in the distal incision.  However overall she is doing well.  Today noninvasive studies show noncompressible ABIs bilaterally however a TBI of 0.69 on the right and 0.80 on the left.  The patient has normal toe waveforms bilaterally.  She has biphasic waveforms on the right with monophasic/biphasic waveforms in the left.   Review of Systems  Cardiovascular:  Positive for leg swelling.  Skin:  Positive for wound.  All other systems reviewed and are negative.     Objective:   Physical Exam Vitals reviewed.  HENT:     Head: Normocephalic.  Cardiovascular:     Rate and Rhythm: Normal rate.     Pulses:          Dorsalis pedis pulses are 1+ on the right side and 1+ on the left side.  Pulmonary:     Effort: Pulmonary effort is normal.  Musculoskeletal:     Left lower leg: Edema present.  Skin:    General: Skin is warm and dry.  Neurological:     Mental Status: She is alert and oriented to person, place, and time.  Psychiatric:        Mood and Affect: Mood normal.        Behavior: Behavior normal.        Thought Content: Thought content normal.        Judgment: Judgment normal.    BP 123/67 (BP Location: Left Arm)   Pulse 91   Resp 16   Past Medical History:  Diagnosis Date   Anemia    Hypertension    Peripheral artery disease (HCC)    Renal disorder     Social History   Socioeconomic History   Marital  status: Single    Spouse name: Not on file   Number of children: Not on file   Years of education: Not on file   Highest education level: Not on file  Occupational History   Not on file  Tobacco Use   Smoking status: Some Days    Packs/day: 0.10    Types: Cigarettes   Smokeless tobacco: Never  Vaping Use   Vaping Use: Former  Substance and Sexual Activity   Alcohol use: No   Drug use: No   Sexual activity: Not on file  Other Topics Concern   Not on file  Social History Narrative   Not on file   Social Determinants of Health   Financial Resource Strain: Not on file  Food Insecurity: Not on file  Transportation Needs: Not on file  Physical Activity: Not on file  Stress: Not on file  Social Connections: Not on file  Intimate Partner Violence: Not on file    Past Surgical History:  Procedure Laterality Date   BREAST REDUCTION SURGERY     CARDIAC CATHETERIZATION     CATARACT EXTRACTION W/PHACO Left  11/11/2019   Procedure: CATARACT EXTRACTION PHACO AND INTRAOCULAR LENS PLACEMENT ([place]) LEFT VISION BLUE;  Surgeon: Marchia Meiers, MD;  Location: [place];  Service: Ophthalmology;  Laterality: Left;  7.94 0:42.4   CATARACT EXTRACTION W/PHACO Right 12/23/2019   Procedure: CATARACT EXTRACTION PHACO AND INTRAOCULAR LENS PLACEMENT ([place]) RIGHT VISION BLUE 5.47  00:31.1;  Surgeon: Marchia Meiers, MD;  Location: [place];  Service: Ophthalmology;  Laterality: Right;   FEMORAL-TIBIAL BYPASS GRAFT Right 01/03/2021   Procedure: BYPASS GRAFT FEMORAL-TIBIAL ARTERY;  Surgeon: Algernon Huxley, MD;  Location: [facility];  Service: Vascular;  Laterality: Right;   LOWER EXTREMITY ANGIOGRAPHY N/A 10/16/2020   Procedure: Lower Extremity Angiography;  Surgeon: Algernon Huxley, MD;  Location: [facility];  Service: Cardiovascular;  Laterality: N/A;   LOWER EXTREMITY ANGIOGRAPHY Right 10/23/2020   Procedure: LOWER EXTREMITY ANGIOGRAPHY;  Surgeon: Algernon Huxley, MD;  Location: [facility];  Service: Cardiovascular;  Laterality: Right;   VEIN HARVEST Right 01/03/2021   Procedure: VEIN HARVEST;  Surgeon: Algernon Huxley, MD;  Location: [facility];  Service: Vascular;  Laterality: Right;    Family History  Problem Relation Age of Onset   Heart attack Mother    Heart disease Sister    Hypertension Sister     Allergies  Allergen Reactions   Gabapentin Other (See Comments)    Nervous, shaky   Plavix [Clopidogrel Bisulfate] Rash    CBC Latest Ref Rng & Units 01/09/2021 01/08/2021 01/07/2021  WBC 4.0 - 10.5 K/uL 12.2(H) 12.6(H) -  Hemoglobin 12.0 - 15.0 g/dL 8.6(L) 8.9(L) 8.7(L)  Hematocrit 36.0 - 46.0 % 26.5(L) 26.2(L) 25.6(L)  Platelets 150 - 400 K/uL 497(H) 455(H) -      CMP     Component Value Date/Time   NA 130 (L) 01/09/2021 0414   K 3.8 01/09/2021 0414   CL 100 01/09/2021 0414   CO2 26 01/09/2021 0414   GLUCOSE 118 (H) 01/09/2021 0414   BUN 25 (H) 01/09/2021 0414   CREATININE 0.97 01/09/2021 0414   CALCIUM 8.5 (L) 01/09/2021 0414   PROT 6.3 (L) 10/15/2020 0250   ALBUMIN 2.9 (L) 10/15/2020 0250   AST 24 10/15/2020 0250   ALT 11 10/15/2020 0250   ALKPHOS 86 10/15/2020 0250   BILITOT 0.7 10/15/2020 0250   GFRNONAA >60 01/09/2021 0414   GFRAA >60 09/20/2019 1312     VAS [place] ABI WITH/WO TBI  Result Date: 02/09/2021  [facility] STUDY Patient Name:  Jody Lowe  Date of Exam:   02/01/2021 Medical Rec #: [id number]       Accession #:    [id number] Date of Birth: [birth year]      Patient Gender: F Patient Age:   87 years Exam Location:  [facility] Procedure:      VAS [place] ABI WITH/WO TBI Referring Phys: --------------------------------------------------------------------------------  Indications: Peripheral artery disease.  Vascular Interventions: 01/08/21 New right fempop redo bypass in situ;. Performing Technologist: Concha Norway RVT  Examination Guidelines: A complete evaluation includes at minimum, Doppler waveform  signals and systolic blood pressure reading at the level of bilateral brachial, anterior tibial, and posterior tibial arteries, when vessel segments are accessible. Bilateral testing is considered an integral part of a complete examination. Photoelectric Plethysmograph (PPG) waveforms and toe systolic pressure readings are included as required and additional duplex testing as needed. Limited examinations for reoccurring indications may be performed as noted.  ABI Findings: +---------+------------------+-----+--------+--------+ Right    Rt  Pressure (mmHg)IndexWaveformComment  +---------+------------------+-----+--------+--------+ Brachial 134                                     +---------+------------------+-----+--------+--------+ PTA                                     NonComp  +---------+------------------+-----+--------+--------+ PERO                                    NonComp  +---------+------------------+-----+--------+--------+ Great Toe92                0.69 Normal           +---------+------------------+-----+--------+--------+ +---------+------------------+-----+--------+-------+ Left     Lt Pressure (mmHg)IndexWaveformComment +---------+------------------+-----+--------+-------+ ATA                                     NonComp +---------+------------------+-----+--------+-------+ PTA                                     NonComp +---------+------------------+-----+--------+-------+ Great Toe107               0.80 Normal          +---------+------------------+-----+--------+-------+  Summary: Right: Resting right ankle-brachial index indicates noncompressible right lower extremity arteries. The right toe-brachial index is normal. Left: Resting left ankle-brachial index indicates noncompressible left lower extremity arteries. The left toe-brachial index is normal.  *See table(s) above for measurements and observations.  Electronically signed by Leotis Pain MD  on 02/09/2021 at 2:03:06 PM.    Final        Assessment & Plan:   1. 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  Patient tolerated this well.  The wound is healing well.  She recently started the antibiotics that were given to her.  We will have patient return in 2 weeks for follow-up wound evaluation.  2. Tobacco use disorder Smoking cessation was discussed, 3-10 minutes spent on this topic specifically   3. Hyperlipidemia, unspecified hyperlipidemia type Continue statin as ordered and reviewed, no changes at this time    Current Outpatient Medications on File Prior to Visit  Medication Sig Dispense Refill   albuterol (VENTOLIN HFA) 108 (90 Base) MCG/ACT inhaler Inhale 1-2 puffs into the lungs every 6 (six) hours as needed for wheezing or shortness of breath.     alendronate (FOSAMAX) 70 MG tablet Take 70 mg by mouth once a week.     amitriptyline (ELAVIL) 25 MG tablet Take 25 mg by mouth at bedtime.     aspirin EC 81 MG tablet Take 81 mg by mouth daily.     atorvastatin (LIPITOR) 20 MG tablet Take 1 tablet (20 mg total) by mouth at bedtime. 30 tablet 0   Cholecalciferol (D3-1000) 25 MCG (1000 UT) capsule Take 1,000 Units by mouth daily.     cyanocobalamin 1000 MCG tablet Take 1,000 mcg by mouth every other day.     diclofenac Sodium (VOLTAREN) 1 % GEL Apply topically 4 (four) times daily as needed.     esomeprazole (Melbeta)  40 MG capsule Take 40 mg by mouth daily.     ferrous sulfate 325 (65 FE) MG tablet Take 325 mg by mouth every other day.     FLUoxetine (PROZAC) 40 MG capsule Take 2 capsules (80 mg total) by mouth daily. Take one capsule (40 mg) by mouth once daily for one month, then increase to two capsules (80 mg) thereafter  3   fluticasone (FLONASE) 50 MCG/ACT nasal spray Place 1 spray into both nostrils daily as needed for allergies or rhinitis.     GNP VITAMIN D  25 MCG (1000 UT) tablet Take 1,000 Units by mouth daily.     ipratropium (ATROVENT) 0.03 % nasal spray Place 2 sprays into both nostrils 3 (three) times daily as needed for rhinitis.     meclizine (ANTIVERT) 25 MG tablet Take 1 tablet (25 mg total) by mouth 3 (three) times daily as needed for dizziness or nausea. 30 tablet 1   nystatin (MYCOSTATIN/NYSTOP) powder Apply 1 application topically 2 (two) times daily as needed.     oxybutynin (DITROPAN-XL) 10 MG 24 hr tablet Take 1 tablet by mouth daily.     oxyCODONE-acetaminophen (PERCOCET/ROXICET) 5-325 MG tablet Take 1-2 tablets by mouth every 6 (six) hours as needed for moderate pain or severe pain. 45 tablet 0   polyethylene glycol (MIRALAX / GLYCOLAX) 17 g packet Take 17 g by mouth daily as needed for mild constipation.     prasugrel (EFFIENT) 10 MG TABS tablet Take 1 tablet (10 mg total) by mouth daily. 30 tablet 0   senna-docusate (SENOKOT-S) 8.6-50 MG tablet Take 2 tablets by mouth daily as needed for mild constipation.     cephALEXin (KEFLEX) 500 MG capsule Take 1 capsule (500 mg total) by mouth 3 (three) times daily. (Patient not taking: Reported on 02/01/2021) 21 capsule 0   No current facility-administered medications on file prior to visit.    There are no Patient Instructions on file for this visit. No follow-ups on file.   Kris Hartmann, NP

## 2021-02-12 ENCOUNTER — Telehealth (INDEPENDENT_AMBULATORY_CARE_PROVIDER_SITE_OTHER): Payer: Self-pay | Admitting: Nurse Practitioner

## 2021-02-12 NOTE — Telephone Encounter (Signed)
ATC pt to schedule a 2 week f/u from her 11.08.22 visit. Unable to LVM - VM full.   2 week wound check. See fb

## 2021-02-13 ENCOUNTER — Ambulatory Visit (INDEPENDENT_AMBULATORY_CARE_PROVIDER_SITE_OTHER): Payer: Medicare Other | Admitting: Podiatry

## 2021-02-13 ENCOUNTER — Other Ambulatory Visit: Payer: Self-pay

## 2021-02-13 DIAGNOSIS — I739 Peripheral vascular disease, unspecified: Secondary | ICD-10-CM | POA: Diagnosis not present

## 2021-02-13 DIAGNOSIS — L97512 Non-pressure chronic ulcer of other part of right foot with fat layer exposed: Secondary | ICD-10-CM | POA: Diagnosis not present

## 2021-02-13 MED ORDER — SANTYL 250 UNIT/GM EX OINT: 1.0000 "application " | TOPICAL_OINTMENT | Freq: Every day | CUTANEOUS | 0 refills | Status: AC

## 2021-02-13 NOTE — Progress Notes (Signed)
Subjective:  Patient ID: Jody Lowe, female    DOB: 21-Jul-1945,  MRN: [id number]  Chief Complaint  Patient presents with   Wound Check    Right foot wound check     75 y.o. female presents for wound care.  Patient patient presents with a follow-up of right first MPJ wound.  Patient states that her leg looks a little bit decreased.  They have been doing Betadine wet-to-dry dressing changes.  She has extensive history of vascular disease.  She has a bypass done to the right leg which is also not feeling well.  I discussed with her to follow back up with her vascular doctor for further management.  She states that she is.   Review of Systems: Negative except as noted in the HPI. Denies N/V/F/Ch.  Past Medical History:  Diagnosis Date   Anemia    Hypertension    Peripheral artery disease (HCC)    Renal disorder     Current Outpatient Medications:    collagenase (SANTYL) ointment, Apply 1 application topically daily., Disp: 15 g, Rfl: 0   albuterol (VENTOLIN HFA) 108 (90 Base) MCG/ACT inhaler, Inhale 1-2 puffs into the lungs every 6 (six) hours as needed for wheezing or shortness of breath., Disp: , Rfl:    alendronate (FOSAMAX) 70 MG tablet, Take 70 mg by mouth once a week., Disp: , Rfl:    amitriptyline (ELAVIL) 25 MG tablet, Take 25 mg by mouth at bedtime., Disp: , Rfl:    aspirin EC 81 MG tablet, Take 81 mg by mouth daily., Disp: , Rfl:    atorvastatin (LIPITOR) 20 MG tablet, Take 1 tablet (20 mg total) by mouth at bedtime., Disp: 30 tablet, Rfl: 0   cephALEXin (KEFLEX) 500 MG capsule, Take 1 capsule (500 mg total) by mouth 3 (three) times daily. (Patient not taking: Reported on 02/01/2021), Disp: 21 capsule, Rfl: 0   Cholecalciferol (D3-1000) 25 MCG (1000 UT) capsule, Take 1,000 Units by mouth daily., Disp: , Rfl:    cyanocobalamin 1000 MCG tablet, Take 1,000 mcg by mouth every other day., Disp: , Rfl:    diclofenac Sodium (VOLTAREN) 1 % GEL, Apply topically 4 (four) times daily  as needed., Disp: , Rfl:    esomeprazole (NEXIUM) 40 MG capsule, Take 40 mg by mouth daily., Disp: , Rfl:    ferrous sulfate 325 (65 FE) MG tablet, Take 325 mg by mouth every other day., Disp: , Rfl:    FLUoxetine (PROZAC) 40 MG capsule, Take 2 capsules (80 mg total) by mouth daily. Take one capsule (40 mg) by mouth once daily for one month, then increase to two capsules (80 mg) thereafter, Disp: , Rfl: 3   fluticasone (FLONASE) 50 MCG/ACT nasal spray, Place 1 spray into both nostrils daily as needed for allergies or rhinitis., Disp: , Rfl:    GNP VITAMIN D 25 MCG (1000 UT) tablet, Take 1,000 Units by mouth daily., Disp: , Rfl:    ipratropium (ATROVENT) 0.03 % nasal spray, Place 2 sprays into both nostrils 3 (three) times daily as needed for rhinitis., Disp: , Rfl:    meclizine (ANTIVERT) 25 MG tablet, Take 1 tablet (25 mg total) by mouth 3 (three) times daily as needed for dizziness or nausea., Disp: 30 tablet, Rfl: 1   nystatin (MYCOSTATIN/NYSTOP) powder, Apply 1 application topically 2 (two) times daily as needed., Disp: , Rfl:    oxybutynin (DITROPAN-XL) 10 MG 24 hr tablet, Take 1 tablet by mouth daily., Disp: , Rfl:  oxyCODONE-acetaminophen (PERCOCET/ROXICET) 5-325 MG tablet, Take 1-2 tablets by mouth every 6 (six) hours as needed for moderate pain or severe pain., Disp: 45 tablet, Rfl: 0   polyethylene glycol (MIRALAX / GLYCOLAX) 17 g packet, Take 17 g by mouth daily as needed for mild constipation., Disp: , Rfl:    prasugrel (EFFIENT) 10 MG TABS tablet, Take 1 tablet (10 mg total) by mouth daily., Disp: 30 tablet, Rfl: 0   senna-docusate (SENOKOT-S) 8.6-50 MG tablet, Take 2 tablets by mouth daily as needed for mild constipation., Disp: , Rfl:   Social History   Tobacco Use  Smoking Status Some Days   Packs/day: 0.10   Types: Cigarettes  Smokeless Tobacco Never    Allergies  Allergen Reactions   Gabapentin Other (See Comments)    Nervous, shaky   Plavix [Clopidogrel Bisulfate]  Rash   Objective:  There were no vitals filed for this visit. There is no height or weight on file to calculate BMI. Constitutional Well developed. Well nourished.  Vascular Dorsalis pedis pulses palpable bilaterally. Posterior tibial pulses palpable bilaterally. Capillary refill normal to all digits.  No cyanosis or clubbing noted. Pedal hair growth normal.  Neurologic Normal speech. Oriented to person, place, and time. Protective sensation absent  Dermatologic Wound Location: Right first MPJ medial wound with fat layer exposed.  Pain on palpation.  No purulent drainage noted no malodor present no erythema noted. Wound Base: Mixed Granular/Fibrotic Peri-wound: Normal Exudate: None: wound tissue dry Wound Measurements: -See below  Orthopedic: No pain to palpation either foot.   Radiographs: None Assessment:   No diagnosis found.  Plan:  Patient was evaluated and treated and all questions answered.  Ulcer right first MPJ wound with fat layer exposed -Debridement as below. -Dressed with Betadine wet-to-dry, DSD. -Continue off-loading with surgical shoe. -Patient has extensive history with right femoral artery to below the knee popliteal bypass with common femoral artery and profunda endarterectomy. -Referral to the wound care center was placed  Procedure: Excisional Debridement of Wound~stagnant Tool: Sharp chisel blade/tissue nipper Rationale: Removal of non-viable soft tissue from the wound to promote healing.  Anesthesia: none Pre-Debridement Wound Measurements: 1 cm x 1 cm x 0.4 cm  Post-Debridement Wound Measurements: 1.1 cm x 1 cm x 0.4 cm  Type of Debridement: Sharp Excisional Tissue Removed: Non-viable soft tissue Blood loss: Minimal (<50cc) Depth of Debridement: subcutaneous tissue. Technique: Sharp excisional debridement to bleeding, viable wound base.  Wound Progress: The wound is stagnant. Dressing: Dry, sterile, compression dressing. Disposition: Patient  tolerated procedure well. Patient to return in 1 week for follow-up.  No follow-ups on file.

## 2021-02-19 ENCOUNTER — Other Ambulatory Visit (INDEPENDENT_AMBULATORY_CARE_PROVIDER_SITE_OTHER): Payer: Self-pay | Admitting: Nurse Practitioner

## 2021-02-19 ENCOUNTER — Ambulatory Visit (INDEPENDENT_AMBULATORY_CARE_PROVIDER_SITE_OTHER): Payer: Medicare Other

## 2021-02-19 ENCOUNTER — Ambulatory Visit (INDEPENDENT_AMBULATORY_CARE_PROVIDER_SITE_OTHER): Payer: Medicare Other | Admitting: Nurse Practitioner

## 2021-02-19 ENCOUNTER — Other Ambulatory Visit: Payer: Self-pay

## 2021-02-19 ENCOUNTER — Encounter (INDEPENDENT_AMBULATORY_CARE_PROVIDER_SITE_OTHER): Payer: Self-pay | Admitting: Nurse Practitioner

## 2021-02-19 VITALS — BP 123/74 | HR 82 | Ht 66.0 in | Wt 200.0 lb

## 2021-02-19 DIAGNOSIS — Z9889 Other specified postprocedural states: Secondary | ICD-10-CM

## 2021-02-19 DIAGNOSIS — M7989 Other specified soft tissue disorders: Secondary | ICD-10-CM | POA: Diagnosis not present

## 2021-02-19 DIAGNOSIS — I70299 Other atherosclerosis of native arteries of extremities, unspecified extremity: Secondary | ICD-10-CM

## 2021-02-19 DIAGNOSIS — L97909 Non-pressure chronic ulcer of unspecified part of unspecified lower leg with unspecified severity: Secondary | ICD-10-CM

## 2021-02-19 DIAGNOSIS — F17200 Nicotine dependence, unspecified, uncomplicated: Secondary | ICD-10-CM

## 2021-02-19 MED ORDER — APIXABAN 2.5 MG PO TABS: 2.5000 mg | ORAL_TABLET | Freq: Two times a day (BID) | ORAL | 5 refills | Status: AC

## 2021-02-19 MED ORDER — SILVER SULFADIAZINE 1 % EX CREA: 1.0000 "application " | TOPICAL_CREAM | Freq: Every day | CUTANEOUS | 0 refills | Status: AC

## 2021-02-19 MED ORDER — PRASUGREL HCL 5 MG PO TABS: 5.0000 mg | ORAL_TABLET | Freq: Every day | ORAL | 6 refills | Status: DC

## 2021-02-20 ENCOUNTER — Telehealth (INDEPENDENT_AMBULATORY_CARE_PROVIDER_SITE_OTHER): Payer: Self-pay

## 2021-02-20 NOTE — Telephone Encounter (Signed)
The pt's Home care nurse called and wanted to make our office aware that she needs new wound  care orders for the pt since she was seen in or office  on 02/19/21. Please advise.

## 2021-02-22 NOTE — Telephone Encounter (Signed)
Sulfasilvadene to wound daily, cover with dry dressing

## 2021-02-23 NOTE — Telephone Encounter (Signed)
Home Care nurse was called and made aware of the order

## 2021-02-24 ENCOUNTER — Encounter (INDEPENDENT_AMBULATORY_CARE_PROVIDER_SITE_OTHER): Payer: Self-pay | Admitting: Nurse Practitioner

## 2021-02-24 NOTE — Progress Notes (Signed)
Subjective:    Patient ID: Jody Lowe, female    DOB: 06/26/1945, 75 y.o.   MRN: [id number] Chief Complaint  Patient presents with   Follow-up    2 week wound chek    Jody Lowe is a 75 year old female that presents today for follow-up evaluation after right femoropopliteal bypass graft.  The most distal incision is healing well.  Now there is only just a few small superficial areas of opening.  The patient continues to have a wound on her right foot being followed by podiatry.  The patient notes that they have advised her to follow-up with wound care.  The patient still has continued swelling in the right lower extremity although it has decreased.  Overall she notes that she is feeling better.  Today ultrasound of the lower extremity showed limited evaluation of the right lower extremity due to edema and body habitus.  There is no evidence of DVT however the study was somewhat limited.  There is a hematoma versus Baker's cyst in the popliteal fossa area which may be causing some compression of the popliteal vein however it is also noted that this area has no internal flow.   Review of Systems  Cardiovascular:  Positive for leg swelling.  Skin:  Positive for wound.  All other systems reviewed and are negative.     Objective:   Physical Exam Vitals reviewed.  HENT:     Head: Normocephalic.  Cardiovascular:     Rate and Rhythm: Normal rate.     Pulses:          Dorsalis pedis pulses are 1+ on the right side.  Pulmonary:     Effort: Pulmonary effort is normal.  Skin:    General: Skin is warm and dry.  Neurological:     Mental Status: She is alert and oriented to person, place, and time.     Motor: Weakness present.     Gait: Gait abnormal.  Psychiatric:        Mood and Affect: Mood normal.        Behavior: Behavior normal.        Thought Content: Thought content normal.        Judgment: Judgment normal.    BP 123/74   Pulse 82   Ht 5\' 6"  (1.676 m)   Wt 200 lb  (90.7 kg)   BMI 32.28 kg/m   Past Medical History:  Diagnosis Date   Anemia    Hypertension    Peripheral artery disease (HCC)    Renal disorder     Social History   Socioeconomic History   Marital status: Single    Spouse name: Not on file   Number of children: Not on file   Years of education: Not on file   Highest education level: Not on file  Occupational History   Not on file  Tobacco Use   Smoking status: Some Days    Packs/day: 0.10    Types: Cigarettes   Smokeless tobacco: Never  Vaping Use   Vaping Use: Former  Substance and Sexual Activity   Alcohol use: No   Drug use: No   Sexual activity: Not on file  Other Topics Concern   Not on file  Social History Narrative   Not on file   Social Determinants of Health   Financial Resource Strain: Not on file  Food Insecurity: Not on file  Transportation Needs: Not on file  Physical Activity: Not on file  Stress: Not  on file  Social Connections: Not on file  Intimate Partner Violence: Not on file    Past Surgical History:  Procedure Laterality Date   BREAST REDUCTION SURGERY     CARDIAC CATHETERIZATION     CATARACT EXTRACTION W/PHACO Left 11/11/2019   Procedure: CATARACT EXTRACTION PHACO AND INTRAOCULAR LENS PLACEMENT (IOC) LEFT VISION BLUE;  Surgeon: Elliot Cousin, MD;  Location: [facility];  Service: Ophthalmology;  Laterality: Left;  7.94 0:42.4   CATARACT EXTRACTION W/PHACO Right 12/23/2019   Procedure: CATARACT EXTRACTION PHACO AND INTRAOCULAR LENS PLACEMENT (IOC) RIGHT VISION BLUE 5.47  00:31.1;  Surgeon: Elliot Cousin, MD;  Location: [facility];  Service: Ophthalmology;  Laterality: Right;   FEMORAL-TIBIAL BYPASS GRAFT Right 01/03/2021   Procedure: BYPASS GRAFT FEMORAL-TIBIAL ARTERY;  Surgeon: Annice Needy, MD;  Location: [facility];  Service: Vascular;  Laterality: Right;   LOWER EXTREMITY ANGIOGRAPHY N/A 10/16/2020   Procedure: Lower Extremity Angiography;  Surgeon: Annice Needy,  MD;  Location: [facility];  Service: Cardiovascular;  Laterality: N/A;   LOWER EXTREMITY ANGIOGRAPHY Right 10/23/2020   Procedure: LOWER EXTREMITY ANGIOGRAPHY;  Surgeon: Annice Needy, MD;  Location: [facility];  Service: Cardiovascular;  Laterality: Right;   VEIN HARVEST Right 01/03/2021   Procedure: VEIN HARVEST;  Surgeon: Annice Needy, MD;  Location: [facility];  Service: Vascular;  Laterality: Right;    Family History  Problem Relation Age of Onset   Heart attack Mother    Heart disease Sister    Hypertension Sister     Allergies  Allergen Reactions   Gabapentin Other (See Comments)    Nervous, shaky   Plavix [Clopidogrel Bisulfate] Rash    CBC Latest Ref Rng & Units 01/09/2021 01/08/2021 01/07/2021  WBC 4.0 - 10.5 K/uL 12.2(H) 12.6(H) -  Hemoglobin 12.0 - 15.0 g/dL 8.3(T) 8.9(L) 8.7(L)  Hematocrit 36.0 - 46.0 % 26.5(L) 26.2(L) 25.6(L)  Platelets 150 - 400 K/uL 497(H) 455(H) -      CMP     Component Value Date/Time   NA 130 (L) 01/09/2021 0414   K 3.8 01/09/2021 0414   CL 100 01/09/2021 0414   CO2 26 01/09/2021 0414   GLUCOSE 118 (H) 01/09/2021 0414   BUN 25 (H) 01/09/2021 0414   CREATININE 0.97 01/09/2021 0414   CALCIUM 8.5 (L) 01/09/2021 0414   PROT 6.3 (L) 10/15/2020 0250   ALBUMIN 2.9 (L) 10/15/2020 0250   AST 24 10/15/2020 0250   ALT 11 10/15/2020 0250   ALKPHOS 86 10/15/2020 0250   BILITOT 0.7 10/15/2020 0250   GFRNONAA >60 01/09/2021 0414   GFRAA >60 09/20/2019 1312     VAS [place] ABI WITH/WO TBI  Result Date: 02/09/2021  LOWER EXTREMITY DOPPLER STUDY Patient Name:  Jody Lowe  Date of Exam:   02/01/2021 Medical Rec #: [id number]       Accession #:    [id number] Date of Birth: Nov 13, 1945      Patient Gender: F Patient Age:   57 years Exam [facility] Procedure:      VAS [place] ABI WITH/WO TBI Referring Phys: --------------------------------------------------------------------------------  Indications: Peripheral  artery disease.  Vascular Interventions: 01/08/21 New right fempop redo bypass in situ;. Performing Technologist: Salvadore Farber RVT  Examination Guidelines: A complete evaluation includes at minimum, Doppler waveform signals and systolic blood pressure reading at the level of bilateral brachial, anterior tibial, and posterior tibial arteries, when vessel segments are accessible. Bilateral testing is considered an  integral part of a complete examination. Photoelectric Plethysmograph (PPG) waveforms and toe systolic pressure readings are included as required and additional duplex testing as needed. Limited examinations for reoccurring indications may be performed as noted.  ABI Findings: +---------+------------------+-----+--------+--------+ Right    Rt Pressure (mmHg)IndexWaveformComment  +---------+------------------+-----+--------+--------+ Brachial 134                                     +---------+------------------+-----+--------+--------+ PTA                                     NonComp  +---------+------------------+-----+--------+--------+ PERO                                    NonComp  +---------+------------------+-----+--------+--------+ Great Toe92                0.69 Normal           +---------+------------------+-----+--------+--------+ +---------+------------------+-----+--------+-------+ Left     Lt Pressure (mmHg)IndexWaveformComment +---------+------------------+-----+--------+-------+ ATA                                     NonComp +---------+------------------+-----+--------+-------+ PTA                                     NonComp +---------+------------------+-----+--------+-------+ Great Toe107               0.80 Normal          +---------+------------------+-----+--------+-------+  Summary: Right: Resting right ankle-brachial index indicates noncompressible right lower extremity arteries. The right toe-brachial index is normal. Left: Resting  left ankle-brachial index indicates noncompressible left lower extremity arteries. The left toe-brachial index is normal.  *See table(s) above for measurements and observations.  Electronically signed by Leotis Pain MD on 02/09/2021 at 2:03:06 PM.    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  There are some superficial areas where we will have the patient placed soft Silvadene covered with a dry dressing.  She also has some continued swelling but it is decreased from previously.  Today noninvasive studies show no DVT but it cannot be completely ruled out either.  On discharge the patient was placed on Effient due to her allergy to Plavix however she has not been taking this due to only being given a 30-day supply.  Instead of refilling Effient we will place the patient on Eliquis 2.5 mg instead.  The swelling is also likely exacerbated by the hematoma/Baker's cyst in the popliteal area.  The patient will return in 3 months with noninvasive studies.  2. Tobacco use disorder Smoking cessation was discussed, 3-10 minutes spent on this topic specifically    Current Outpatient Medications on File Prior to Visit  Medication Sig Dispense Refill   albuterol (VENTOLIN HFA) 108 (90 Base) MCG/ACT inhaler Inhale 1-2 puffs into the lungs every 6 (six) hours as needed for wheezing or shortness of breath.     alendronate (FOSAMAX) 70 MG tablet Take 70 mg by mouth once a week.  amitriptyline (ELAVIL) 25 MG tablet Take 25 mg by mouth at bedtime.     aspirin EC 81 MG tablet Take 81 mg by mouth daily.     atorvastatin (LIPITOR) 20 MG tablet Take 1 tablet (20 mg total) by mouth at bedtime. 30 tablet 0   cephALEXin (KEFLEX) 500 MG capsule Take 1 capsule (500 mg total) by mouth 3 (three) times daily. 21 capsule 0   Cholecalciferol (D3-1000) 25 MCG (1000 UT) capsule Take 1,000 Units by mouth daily.     collagenase  (SANTYL) ointment Apply 1 application topically daily. 15 g 0   cyanocobalamin 1000 MCG tablet Take 1,000 mcg by mouth every other day.     diclofenac Sodium (VOLTAREN) 1 % GEL Apply topically 4 (four) times daily as needed.     esomeprazole (NEXIUM) 40 MG capsule Take 40 mg by mouth daily.     ferrous sulfate 325 (65 FE) MG tablet Take 325 mg by mouth every other day.     FLUoxetine (PROZAC) 40 MG capsule Take 2 capsules (80 mg total) by mouth daily. Take one capsule (40 mg) by mouth once daily for one month, then increase to two capsules (80 mg) thereafter  3   fluticasone (FLONASE) 50 MCG/ACT nasal spray Place 1 spray into both nostrils daily as needed for allergies or rhinitis.     GNP VITAMIN D 25 MCG (1000 UT) tablet Take 1,000 Units by mouth daily.     ipratropium (ATROVENT) 0.03 % nasal spray Place 2 sprays into both nostrils 3 (three) times daily as needed for rhinitis.     meclizine (ANTIVERT) 25 MG tablet Take 1 tablet (25 mg total) by mouth 3 (three) times daily as needed for dizziness or nausea. 30 tablet 1   nystatin (MYCOSTATIN/NYSTOP) powder Apply 1 application topically 2 (two) times daily as needed.     oxybutynin (DITROPAN-XL) 10 MG 24 hr tablet Take 1 tablet by mouth daily.     oxyCODONE-acetaminophen (PERCOCET/ROXICET) 5-325 MG tablet Take 1-2 tablets by mouth every 6 (six) hours as needed for moderate pain or severe pain. 45 tablet 0   polyethylene glycol (MIRALAX / GLYCOLAX) 17 g packet Take 17 g by mouth daily as needed for mild constipation.     senna-docusate (SENOKOT-S) 8.6-50 MG tablet Take 2 tablets by mouth daily as needed for mild constipation.     No current facility-administered medications on file prior to visit.    There are no Patient Instructions on file for this visit. No follow-ups on file.   Kris Hartmann, NP

## 2021-02-27 ENCOUNTER — Encounter: Payer: Medicare Other | Attending: Physician Assistant | Admitting: Physician Assistant

## 2021-02-27 ENCOUNTER — Other Ambulatory Visit: Payer: Self-pay

## 2021-02-27 DIAGNOSIS — I129 Hypertensive chronic kidney disease with stage 1 through stage 4 chronic kidney disease, or unspecified chronic kidney disease: Secondary | ICD-10-CM | POA: Insufficient documentation

## 2021-02-27 DIAGNOSIS — I739 Peripheral vascular disease, unspecified: Secondary | ICD-10-CM | POA: Diagnosis not present

## 2021-02-27 DIAGNOSIS — N189 Chronic kidney disease, unspecified: Secondary | ICD-10-CM | POA: Insufficient documentation

## 2021-02-27 DIAGNOSIS — L89893 Pressure ulcer of other site, stage 3: Secondary | ICD-10-CM | POA: Insufficient documentation

## 2021-02-27 NOTE — Progress Notes (Addendum)
CAPRICE, MCCAFFREY ([id number]) Visit Report for 02/27/2021 Allergy List Details Patient Name: Jody Lowe, PFANNENSTIEL. Date of Service: 02/27/2021 10:00 AM Medical Record Number: [id number] Patient Account Number: [id number] Date of Birth/Sex: Aug 16, 1945 (75 y.o. Female) Treating RN: Hansel Feinstein Primary Care Glenis Musolf: Jeffie Pollock Other Clinician: Referring Rutherford Alarie: Nicholes Rough Treating Manvir Thorson/Extender: Allen Derry Weeks in Treatment: 0 Allergies Active Allergies Plavix Severity: Moderate gabapentin Reaction: confusion Severity: Moderate Allergy Notes Electronic Signature(s) Signed: 02/27/2021 12:02:03 PM By: Hansel Feinstein Entered By: Hansel Feinstein on 02/27/2021 10:01:55 Faubert, [name] ([id number]) -------------------------------------------------------------------------------- Arrival Information Details Patient Name: Jody Lowe Date of Service: 02/27/2021 10:00 AM Medical Record Number: [id number] Patient Account Number: [id number] Date of Birth/Sex: 1945/10/11 (75 y.o. Female) Treating RN: Hansel Feinstein Primary Care Estelle Greenleaf: Jeffie Pollock Other Clinician: Referring Haroun Cotham: Nicholes Rough Treating Noa Constante/Extender: Rowan Blase in Treatment: 0 Visit Information Patient Arrived: Wheel Chair Arrival Time: 09:57 Accompanied By: self Transfer Assistance: EasyPivot Patient Lift Patient Identification Verified: Yes Secondary Verification Process Completed: Yes Patient Requires Transmission-Based No Precautions: Patient Has Alerts: Yes Patient Alerts: Patient on Blood Thinner Eliquis/ASA 81mg  [facility] ABI-N/C Nov '22 TBI-L 0.80; R 0.69 NOT diabetic Electronic Signature(s) Signed: 03/06/2021 11:23:09 AM By: 03/08/2021 Previous Signature: 02/27/2021 12:02:03 PM Version By: 14/08/2020 Entered By: Hansel Feinstein on 03/06/2021 11:23:09 Landen, 03/08/2021 ([name]) -------------------------------------------------------------------------------- Clinic Level of Care  Assessment Details Patient Name: [id number] Date of Service: 02/27/2021 10:00 AM Medical Record Number: 14/08/2020 Patient Account Number: [id number] Date of Birth/Sex: January 12, 1946 (75 y.o. Female) Treating RN: 11-30[birth year]1992 Primary Care Simone Tuckey: Hansel Feinstein Other Clinician: Referring Minnah Llamas: Jeffie Pollock Treating Staton Markey/Extender: Nicholes Rough in Treatment: 0 Clinic Level of Care Assessment Items TOOL 1 Quantity Score X - Use when EandM and Procedure is performed on INITIAL visit 1 0 ASSESSMENTS - Nursing Assessment / Reassessment X - General Physical Exam (combine w/ comprehensive assessment (listed just below) when performed on new 1 20 pt. evals) X- 1 25 Comprehensive Assessment (HX, ROS, Risk Assessments, Wounds Hx, etc.) ASSESSMENTS - Wound and Skin Assessment / Reassessment []  - Dermatologic / Skin Assessment (not related to wound area) 0 ASSESSMENTS - Ostomy and/or Continence Assessment and Care []  - Incontinence Assessment and Management 0 []  - 0 Ostomy Care Assessment and Management (repouching, etc.) PROCESS - Coordination of Care X - Simple Patient / Family Education for ongoing care 1 15 []  - 0 Complex (extensive) Patient / Family Education for ongoing care X- 1 10 Staff obtains Consents, Records, Test Results / Process Orders X- 1 10 Staff telephones HHA, Nursing Homes / Clarify orders / etc []  - 0 Routine Transfer to another Facility (non-emergent condition) []  - 0 Routine Hospital Admission (non-emergent condition) X- 1 15 New Admissions / Rowan Blase / Ordering NPWT, Apligraf, etc. []  - 0 Emergency Hospital Admission (emergent condition) PROCESS - Special Needs []  - Pediatric / Minor Patient Management 0 []  - 0 Isolation Patient Management []  - 0 Hearing / Language / Visual special needs []  - 0 Assessment of Community assistance (transportation, D/C planning, etc.) []  - 0 Additional assistance / Altered mentation []  -  0 Support Surface(s) Assessment (bed, cushion, seat, etc.) INTERVENTIONS - Miscellaneous []  - External ear exam 0 []  - 0 Patient Transfer (multiple staff / / Similar devices) []  - 0 Simple Staple / Suture removal (25 or less) []  - 0 Complex Staple / Suture removal (26 or more) []  - 0 Hypo/Hyperglycemic Management (do not check if billed separately) []  -  0 Ankle / Brachial Index (ABI) - do not check if billed separately Has the patient been seen at the hospital within the last three years: Yes Total Score: 95 Level Of Care: New/Established - Level 3 Rosenbloom, [name] ([id number]) Electronic Signature(s) Signed: 02/27/2021 12:02:03 PM By: Hansel Feinstein Entered By: Hansel Feinstein on 02/27/2021 12:00:46 Berkowitz, [name] ([id number]) -------------------------------------------------------------------------------- Encounter Discharge Information Details Patient Name: Jody Lowe Date of Service: 02/27/2021 10:00 AM Medical Record Number: [id number] Patient Account Number: [id number] Date of Birth/Sex: 1945-07-08 (75 y.o. Female) Treating RN: Hansel Feinstein Primary Care Hilding Quintanar: Jeffie Pollock Other Clinician: Referring Jaxsyn Catalfamo: Nicholes Rough Treating Gildardo Tickner/Extender: Rowan Blase in Treatment: 0 Encounter Discharge Information Items Post Procedure Vitals Discharge Condition: Stable Temperature (F): 98.2 Ambulatory Status: Wheelchair Pulse (bpm): 84 Discharge Destination: Home Respiratory Rate (breaths/min): 16 Transportation: Private Auto Blood Pressure (mmHg): 141/63 Accompanied By: self Schedule Follow-up Appointment: Yes Clinical Summary of Care: Electronic Signature(s) Signed: 02/27/2021 11:59:14 AM By: Hansel Feinstein Entered By: Hansel Feinstein on 02/27/2021 11:59:14 Vollman, [name] ([id number]) -------------------------------------------------------------------------------- Lower Extremity Assessment Details Patient Name: Jody Lowe Date of Service:  02/27/2021 10:00 AM Medical Record Number: [id number] Patient Account Number: [id number] Date of Birth/Sex: [birth year] (75 y.o. Female) Treating RN: Hansel Feinstein Primary Care Carlo Guevarra: Jeffie Pollock Other Clinician: Referring Lylla Eifler: Nicholes Rough Treating Sheria Rosello/Extender: Rowan Blase in Treatment: 0 Edema Assessment Assessed: [Left: No] [Right: Yes] Edema: [Left: N] [Right: o] Calf Left: Right: Point of Measurement: 34 cm From Medial Instep 43 cm Ankle Left: Right: Point of Measurement: 9 cm From Medial Instep 24 cm Knee To Floor Left: Right: From Medial Instep 40 cm Vascular Assessment Pulses: Dorsalis Pedis Palpable: [Right:Yes] Notes [facility] [place] bilateral 02/01/21 TBI L 0.80; R 0.69 Electronic Signature(s) Signed: 02/27/2021 12:02:03 PM By: Hansel Feinstein Entered ByHansel Feinstein on 02/27/2021 10:20:59 Trew, [name] ([id number]) -------------------------------------------------------------------------------- Multi Wound Chart Details Patient Name: Jody Lowe Date of Service: 02/27/2021 10:00 AM Medical Record Number: [id number] Patient Account Number: [id number] Date of Birth/Sex: 07/24/1945 (75 y.o. Female) Treating RN: Hansel Feinstein Primary Care Zahari Xiang: Jeffie Pollock Other Clinician: Referring Shalawn Wynder: Nicholes Rough Treating Shantanique Hodo/Extender: Rowan Blase in Treatment: 0 Vital Signs Height(in): 66 Pulse(bpm): 84 Weight(lbs): 199 Blood Pressure(mmHg): 141/63 Body Mass Index(BMI): 32 Temperature(F): 98.2 Respiratory Rate(breaths/min): 16 Photos: [N/A:N/A] Wound Location: Right, Medial Metatarsal head first N/A N/A Wounding Event: Blister N/A N/A Primary Etiology: Atypical N/A N/A Comorbid History: Cataracts, Anemia, Coronary Artery N/A N/A Disease, Deep Vein Thrombosis, Hypertension, Gout, Osteoarthritis Date Acquired: 11/23/2020 N/A N/A Weeks of Treatment: 0 N/A N/A Wound Status: Open N/A N/A Measurements L x W x D (cm) 0.4x0.4x0.5  N/A N/A Area (cm) : 0.126 N/A N/A Volume (cm) : 0.063 N/A N/A Starting Position 1 (o'clock): 12 Ending Position 1 (o'clock): 12 Maximum Distance 1 (cm): 0.4 Undermining: Yes N/A N/A Classification: Full Thickness Without Exposed N/A N/A Support Structures Exudate Amount: Medium N/A N/A Exudate Type: Serosanguineous N/A N/A Exudate Color: red, brown N/A N/A Wound Margin: Thickened N/A N/A Granulation Amount: None Present (0%) N/A N/A Necrotic Amount: Large (67-100%) N/A N/A Exposed Structures: Fat Layer (Subcutaneous Tissue): N/A N/A Yes Fascia: No Tendon: No Muscle: No Joint: No Bone: No Treatment Notes Electronic Signature(s) Signed: 02/27/2021 12:02:03 PM By: Hansel Feinstein Entered By: Hansel Feinstein on 02/27/2021 11:02:07 Hegeman, [name] ([id number]) -------------------------------------------------------------------------------- Multi-Disciplinary Care Plan Details Patient Name: Jody Lowe Date of Service: 02/27/2021 10:00 AM Medical Record Number: [id number] Patient Account Number: [id number] Date of Birth/Sex: July 03, 1945 (75  y.o. Female) Treating RN: Hansel Feinstein Primary Care Ethelle Ola: Jeffie Pollock Other Clinician: Referring Nickolette Espinola: Nicholes Rough Treating Shreyas Piatkowski/Extender: Rowan Blase in Treatment: 0 Active Inactive Orientation to the Wound Care Program Nursing Diagnoses: Knowledge deficit related to the wound healing center program Goals: Patient/caregiver will verbalize understanding of the Wound Healing Center Program Date Initiated: 02/27/2021 Target Resolution Date: 03/13/2021 Goal Status: Active Interventions: Provide education on orientation to the wound center Notes: Wound/Skin Impairment Nursing Diagnoses: Impaired tissue integrity Knowledge deficit related to smoking impact on wound healing Knowledge deficit related to ulceration/compromised skin integrity Goals: Patient will demonstrate a reduced rate of smoking or cessation of  smoking Date Initiated: 02/27/2021 Target Resolution Date: 04/25/2021 Goal Status: Active Ulcer/skin breakdown will have a volume reduction of 30% by week 4 Date Initiated: 02/27/2021 Target Resolution Date: 03/27/2021 Goal Status: Active Ulcer/skin breakdown will have a volume reduction of 50% by week 8 Date Initiated: 02/27/2021 Target Resolution Date: 04/24/2021 Goal Status: Active Ulcer/skin breakdown will have a volume reduction of 80% by week 12 Date Initiated: 02/27/2021 Target Resolution Date: 05/22/2021 Goal Status: Active Ulcer/skin breakdown will heal within 14 weeks Date Initiated: 02/27/2021 Target Resolution Date: 06/12/2021 Goal Status: Active Interventions: Assess patient/caregiver ability to obtain necessary supplies Assess patient/caregiver ability to perform ulcer/skin care regimen upon admission and as needed Assess ulceration(s) every visit Provide education on smoking Notes: Electronic Signature(s) Signed: 02/27/2021 12:02:03 PM By: Hansel Feinstein Entered ByHansel Feinstein on 02/27/2021 11:01:58 Overbaugh, [name] ([id number]) -------------------------------------------------------------------------------- Pain Assessment Details Patient Name: Jody Lowe Date of Service: 02/27/2021 10:00 AM Medical Record Number: [id number] Patient Account Number: [id number] Date of Birth/Sex: 1945/11/06 (75 y.o. Female) Treating RN: Hansel Feinstein Primary Care Shirin Echeverry: Jeffie Pollock Other Clinician: Referring Jeffey Janssen: Nicholes Rough Treating Gustav Knueppel/Extender: Rowan Blase in Treatment: 0 Active Problems Location of Pain Severity and Description of Pain Patient Has Paino Yes Site Locations Pain Location: Generalized Pain, Pain in Ulcers Rate the pain. Current Pain Level: 4 Pain Management and Medication Current Pain Management: Electronic Signature(s) Signed: 02/27/2021 12:02:03 PM By: Hansel Feinstein Entered By: Hansel Feinstein on 02/27/2021 09:59:53 Maresh, [name]  ([id number]) -------------------------------------------------------------------------------- Patient/Caregiver Education Details Patient Name: Jody Lowe Date of Service: 02/27/2021 10:00 AM Medical Record Number: [id number] Patient Account Number: [id number] Date of Birth/Gender: Nov 22, 1945 (75 y.o. Female) Treating RN: Hansel Feinstein Primary Care Physician: Jeffie Pollock Other Clinician: Referring Physician: Nicholes Rough Treating Physician/Extender: Rowan Blase in Treatment: 0 Education Assessment Education Provided To: Patient Education Topics Provided Basic Hygiene: Infection: Offloading: Pressure: Smoking and Wound Healing: Welcome To The [facility]: Wound/Skin Impairment: Electronic Signature(s) Signed: 02/27/2021 12:02:03 PM By: Hansel Feinstein Entered By: Hansel Feinstein on 02/27/2021 12:01:39 Bruning, [name] ([id number]) -------------------------------------------------------------------------------- Wound Assessment Details Patient Name: Jody Lowe Date of Service: 02/27/2021 10:00 AM Medical Record Number: [id number] Patient Account Number: [id number] Date of Birth/Sex: July 11, 1945 (75 y.o. Female) Treating RN: Hansel Feinstein Primary Care Aerilyn Slee: Jeffie Pollock Other Clinician: Referring Peg Fifer: Nicholes Rough Treating Daion Ginsberg/Extender: Rowan Blase in Treatment: 0 Wound Status Wound Number: 1 Primary Pressure Ulcer Etiology: Wound Location: Right, Medial Metatarsal head first Secondary Arterial Insufficiency Ulcer Wounding Event: Blister Etiology: Date Acquired: 11/23/2020 Wound Open Weeks Of Treatment: 0 Status: Clustered Wound: No Comorbid Cataracts, Anemia, Asthma, Coronary Artery Disease, History: Deep Vein Thrombosis, Hypertension, Gout, Osteoarthritis Photos Wound Measurements Length: (cm) 0.4 % Re Width: (cm) 0.4 % Re Depth: (cm) 0.5 Tunn Area: (cm) 0.126 Und Volume: (cm) 0.063 S En Ma duction in Area:  0% duction in Volume: 0% eling: No ermining: Yes tarting Position (o'clock): 12 ding Position (o'clock): 12 ximum Distance: (cm) 0.4 Wound Description Classification: Category/Stage III Foul Wound Margin: Thickened Slou Exudate Amount: Medium Exudate Type: Serosanguineous Exudate Color: red, brown Odor After Cleansing: No gh/Fibrino Yes Wound Bed Granulation Amount: None Present (0%) Exposed Structure Necrotic Amount: Large (67-100%) Fascia Exposed: No Necrotic Quality: Adherent Slough Fat Layer (Subcutaneous Tissue) Exposed: Yes Tendon Exposed: No Muscle Exposed: No Joint Exposed: No Bone Exposed: No Treatment Notes Wound #1 (Metatarsal head first) Wound Laterality: Right, Medial Nordell, [name] ([id number]) Cleanser Normal Saline Discharge Instruction: Wash your hands with soap and water. Remove old dressing, discard into plastic bag and place into trash. Cleanse the wound with Normal Saline prior to applying a clean dressing using gauze sponges, not tissues or cotton balls. Do not scrub or use excessive force. Pat dry using gauze sponges, not tissue or cotton balls. Soap and Water Discharge Instruction: Gently cleanse wound with antibacterial soap, rinse and pat dry prior to dressing wounds Peri-Wound Care Topical Primary Dressing IODOFLEX 0.9% Cadexomer Iodine Pad Discharge Instruction: Apply Iodoflex to wound bed only as directed. Secondary Dressing Zetuvit Plus Silicone Border Dressing 4x4 (in/in) Secured With Compression Wrap Compression Stockings Add-Ons Electronic Signature(s) Signed: 02/27/2021 12:02:03 PM By: Hansel Feinstein Entered By: Hansel Feinstein on 02/27/2021 11:02:53 Penman, [name] ([id number]) -------------------------------------------------------------------------------- Vitals Details Patient Name: Jody Lowe Date of Service: 02/27/2021 10:00 AM Medical Record Number: [id number] Patient Account Number: [id number] Date of Birth/Sex: May 16, 1945  (75 y.o. Female) Treating RN: Hansel Feinstein Primary Care Dhruv Christina: Jeffie Pollock Other Clinician: Referring Calandra Madura: Nicholes Rough Treating Annelyse Rey/Extender: Rowan Blase in Treatment: 0 Vital Signs Time Taken: 09:58 Temperature (F): 98.2 Height (in): 66 Pulse (bpm): 84 Source: Stated Respiratory Rate (breaths/min): 16 Weight (lbs): 199 Blood Pressure (mmHg): 141/63 Source: Stated Reference Range: 80 - 120 mg / dl Body Mass Index (BMI): 32.1 Notes stated last weight at MD office and unable to safely stay on scale Electronic Signature(s) Signed: 02/27/2021 12:02:03 PM By: Hansel Feinstein Entered ByHansel Feinstein on 02/27/2021 10:01:26

## 2021-02-27 NOTE — Progress Notes (Signed)
Jody Lowe ([id number]) Visit Report for 02/27/2021 Abuse/Suicide Risk Screen Details Patient Name: Jody Lowe, Jody Lowe. Date of Service: 02/27/2021 10:00 AM Medical Record Number: [id number] Patient Account Number: [id number] Date of Birth/Sex: 1945-11-02 (75 y.o. F) Treating RN: Hansel Feinstein Primary Care Dorothy Polhemus: Jeffie Pollock Other Clinician: Referring Shemekia Patane: Nicholes Rough Treating Paz Fuentes/Extender: Rowan Blase in Treatment: 0 Abuse/Suicide Risk Screen Items Answer ABUSE RISK SCREEN: Has anyone close to you tried to hurt or harm you recentlyo No Do you feel uncomfortable with anyone in your familyo No Has anyone forced you do things that you didnot want to doo No Electronic Signature(s) Signed: 02/27/2021 12:02:03 PM By: Hansel Feinstein Entered By: Hansel Feinstein on 02/27/2021 10:05:26 Pazos, [name] ([id number]) -------------------------------------------------------------------------------- Activities of Daily Living Details Patient Name: Jody Lowe Date of Service: 02/27/2021 10:00 AM Medical Record Number: [id number] Patient Account Number: [id number] Date of Birth/Sex: 1945-10-14 (75 y.o. F) Treating RN: Hansel Feinstein Primary Care Rayana Geurin: Jeffie Pollock Other Clinician: Referring Lagena Strand: Nicholes Rough Treating Natoshia Souter/Extender: Rowan Blase in Treatment: 0 Activities of Daily Living Items Answer Activities of Daily Living (Please select one for each item) Drive Automobile Not Able Take Medications Completely Able Use Telephone Completely Able Care for Appearance Completely Able Use Toilet Completely Able Bath / Shower Need Assistance Dress Self Completely Able Feed Self Completely Able Walk Completely Able Get In / Out Bed Completely Able Housework Need Assistance Prepare Meals Need Assistance Handle Money Completely Able Shop for Self Not Able Electronic Signature(s) Signed: 02/27/2021 12:02:03 PM By: Hansel Feinstein Entered By: Hansel Feinstein on 02/27/2021 10:06:38 Redd, [name] ([id number]) -------------------------------------------------------------------------------- Education Screening Details Patient Name: Jody Lowe Date of Service: 02/27/2021 10:00 AM Medical Record Number: [id number] Patient Account Number: [id number] Date of Birth/Sex: [birth year] (75 y.o. F) Treating RN: Hansel Feinstein Primary Care Khang Hannum: Jeffie Pollock Other Clinician: Referring Izael Bessinger: Nicholes Rough Treating Artice Bergerson/Extender: Rowan Blase in Treatment: 0 Primary Learner Assessed: Patient Learning Preferences/Education Level/Primary Language Learning Preference: Explanation Highest Education Level: High School Preferred Language: English Cognitive Barrier Language Barrier: No Translator Needed: No Memory Deficit: No Emotional Barrier: No Cultural/Religious Beliefs Affecting Medical Care: No Physical Barrier Impaired Vision: No Impaired Hearing: No Decreased Hand dexterity: No Knowledge/Comprehension Knowledge Level: High Comprehension Level: High Ability to understand written instructions: High Ability to understand verbal instructions: High Motivation Anxiety Level: Calm Cooperation: Cooperative Education Importance: Acknowledges Need Interest in Health Problems: Asks Questions Perception: Coherent Willingness to Engage in Self-Management High Activities: Readiness to Engage in Self-Management High Activities: Electronic Signature(s) Signed: 02/27/2021 12:02:03 PM By: Hansel Feinstein Entered ByHansel Feinstein on 02/27/2021 10:07:03 Delao, [name] ([id number]) -------------------------------------------------------------------------------- Fall Risk Assessment Details Patient Name: Jody Lowe Date of Service: 02/27/2021 10:00 AM Medical Record Number: [id number] Patient Account Number: [id number] Date of Birth/Sex: 22-Apr-1945 (75 y.o. F) Treating RN: Hansel Feinstein Primary Care Kitti Mcclish: Jeffie Pollock Other Clinician: Referring Claris Guymon: Nicholes Rough Treating Banyan Goodchild/Extender: Rowan Blase in Treatment: 0 Fall Risk Assessment Items Have you had 2 or more falls in the last 12 monthso 0 Yes Have you had any fall that resulted in injury in the last 12 monthso 0 No FALLS RISK SCREEN History of falling - immediate or within 3 months 0 No Secondary diagnosis (Do you have 2 or more medical diagnoseso) 15 Yes Ambulatory aid None/bed rest/wheelchair/nurse 0 No Crutches/cane/walker 15 Yes Furniture 0 No Intravenous therapy Access/Saline/Heparin Lock 0 No Gait/Transferring Normal/ bed rest/ wheelchair 0 No Weak (short steps with or without shuffle, stooped  but able to lift head while walking, may 10 Yes seek support from furniture) Impaired (short steps with shuffle, may have difficulty arising from chair, head down, impaired 0 No balance) Mental Status Oriented to own ability 0 Yes Electronic Signature(s) Signed: 02/27/2021 12:02:03 PM By: Hansel Feinstein Entered By: Hansel Feinstein on 02/27/2021 10:07:30 Walck, [name] ([id number]) -------------------------------------------------------------------------------- Foot Assessment Details Patient Name: Jody Lowe Date of Service: 02/27/2021 10:00 AM Medical Record Number: [id number] Patient Account Number: [id number] Date of Birth/Sex: 1945-04-11 (75 y.o. F) Treating RN: Hansel Feinstein Primary Care Avrey Flanagin: Jeffie Pollock Other Clinician: Referring Romario Tith: Nicholes Rough Treating Alaiya Martindelcampo/Extender: Rowan Blase in Treatment: 0 Foot Assessment Items Site Locations + = Sensation present, - = Sensation absent, C = Callus, U = Ulcer R = Redness, W = Warmth, M = Maceration, PU = Pre-ulcerative lesion F = Fissure, S = Swelling, D = Dryness Assessment Right: Left: Other Deformity: No No Prior Foot Ulcer: No No Prior Amputation: No No Charcot Joint: No No Ambulatory Status: Ambulatory With Help Assistance Device:  Walker Gait: Surveyor, mining) Signed: 02/27/2021 12:02:03 PM By: Hansel Feinstein Entered ByHansel Feinstein on 02/27/2021 10:12:36 Bazin, [name] ([id number]) -------------------------------------------------------------------------------- Nutrition Risk Screening Details Patient Name: Jody Lowe Date of Service: 02/27/2021 10:00 AM Medical Record Number: [id number] Patient Account Number: [id number] Date of Birth/Sex: [birth year] (75 y.o. F) Treating RN: Hansel Feinstein Primary Care Icess Bertoni: Jeffie Pollock Other Clinician: Referring Verdell Kincannon: Nicholes Rough Treating Geovonni Meyerhoff/Extender: Rowan Blase in Treatment: 0 Height (in): 66 Weight (lbs): 199 Body Mass Index (BMI): 32.1 Nutrition Risk Screening Items Score Screening NUTRITION RISK SCREEN: I have an illness or condition that made me change the kind and/or amount of food I eat 0 No I eat fewer than two meals per day 3 Yes I eat few fruits and vegetables, or milk products 2 Yes I have three or more drinks of beer, liquor or wine almost every day 0 No I have tooth or mouth problems that make it hard for me to eat 0 No I don't always have enough money to buy the food I need 0 No I eat alone most of the time 1 Yes I take three or more different prescribed or over-the-counter drugs a day 1 Yes Without wanting to, I have lost or gained 10 pounds in the last six months 0 No I am not always physically able to shop, cook and/or feed myself 0 No Nutrition Protocols Good Risk Protocol Moderate Risk Protocol 0 Provide education on nutrition High Risk Proctocol Risk Level: High Risk Score: 7 Electronic Signature(s) Signed: 02/27/2021 12:02:03 PM By: Hansel Feinstein Entered ByHansel Feinstein on 02/27/2021 10:07:46

## 2021-02-28 ENCOUNTER — Other Ambulatory Visit: Payer: Self-pay | Admitting: Physician Assistant

## 2021-02-28 ENCOUNTER — Ambulatory Visit
Admission: RE | Admit: 2021-02-28 | Discharge: 2021-02-28 | Disposition: A | Discharge status: Home / Self Care | Payer: Medicare Other | Attending: Physician Assistant | Admitting: Physician Assistant

## 2021-02-28 ENCOUNTER — Ambulatory Visit
Admission: RE | Admit: 2021-02-28 | Discharge: 2021-02-28 | Disposition: A | Discharge status: Home / Self Care | Payer: Medicare Other | Source: Ambulatory Visit | Attending: Physician Assistant | Admitting: Physician Assistant

## 2021-02-28 DIAGNOSIS — S81801A Unspecified open wound, right lower leg, initial encounter: Secondary | ICD-10-CM | POA: Diagnosis present

## 2021-02-28 NOTE — Progress Notes (Signed)
KRYSTIN, KEEVEN ([id number]) Visit Report for 02/27/2021 Chief Complaint Document Details Patient Name: Jody Lowe, Jody Lowe. Date of Service: 02/27/2021 10:00 AM Medical Record Number: [id number] Patient Account Number: [id number] Date of Birth/Sex: 12/13/1945 (75 y.o. F) Treating RN: Hansel Feinstein Primary Care Provider: Jeffie Pollock Other Clinician: Referring Provider: Nicholes Rough Treating Provider/Extender: Rowan Blase in Treatment: 0 Information Obtained from: Patient Chief Complaint Right Foot Ulcer Electronic Signature(s) Signed: 02/27/2021 10:57:25 AM By: Lenda Kelp PA-C Entered By: Lenda Kelp on 02/27/2021 10:57:24 [place]stern, [name] ([id number]) -------------------------------------------------------------------------------- Debridement Details Patient Name: Jody Lowe Date of Service: 02/27/2021 10:00 AM Medical Record Number: [id number] Patient Account Number: [id number] Date of Birth/Sex: September 04, 1945 (75 y.o. F) Treating RN: Hansel Feinstein Primary Care Provider: Jeffie Pollock Other Clinician: Referring Provider: Nicholes Rough Treating Provider/Extender: Rowan Blase in Treatment: 0 Debridement Performed for Wound #1 Right,Medial Metatarsal head first Assessment: Performed By: Physician Nelida Meuse., PA-C Debridement Type: Debridement Severity of Tissue Pre Debridement: Fat layer exposed Level of Consciousness (Pre- Awake and Alert procedure): Pre-procedure Verification/Time Out Yes - 11:03 Taken: Start Time: 11:04 Pain Control: Lidocaine Total Area Debrided (L x W): 0.6 (cm) x 0.6 (cm) = 0.36 (cm) Tissue and other material Viable, Non-Viable, Callus, Slough, Subcutaneous, Slough debrided: Level: Skin/Subcutaneous Tissue Debridement Description: Excisional Instrument: Curette Bleeding: Moderate Hemostasis Achieved: Pressure Response to Treatment: Procedure was tolerated well Level of Consciousness (Post- Awake and  Alert procedure): Post Debridement Measurements of Total Wound Length: (cm) 0.6 Stage: Category/Stage III Width: (cm) 0.5 Depth: (cm) 0.5 Volume: (cm) 0.118 Character of Wound/Ulcer Post Debridement: Improved Severity of Tissue Post Debridement: Fat layer exposed Post Procedure Diagnosis Same as Pre-procedure Electronic Signature(s) Signed: 02/27/2021 12:02:03 PM By: Hansel Feinstein Signed: 02/27/2021 4:19:41 PM By: Lenda Kelp PA-C Entered By: Hansel Feinstein on 02/27/2021 11:07:19 Schirtzinger, [name] ([id number]) -------------------------------------------------------------------------------- HPI Details Patient Name: Jody Lowe Date of Service: 02/27/2021 10:00 AM Medical Record Number: [id number] Patient Account Number: [id number] Date of Birth/Sex: 06/12/1945 (75 y.o. F) Treating RN: Hansel Feinstein Primary Care Provider: Jeffie Pollock Other Clinician: Referring Provider: Nicholes Rough Treating Provider/Extender: Rowan Blase in Treatment: 0 History of Present Illness HPI Description: 02/27/2021 upon evaluation today patient presents for inspection in our office today concerning issues that she has been having over the right medial first metatarsal head. This is actually a pressure ulcer that I think developed as result of the patient actually passing out in her driveway and this incident occurred around the beginning of September 2022. She ended up October 10 having bypass surgery in regard to her leg to restore blood flow to the extremity and subsequently I think that this probably contributed to the wound development as with limited blood flow she would not have needed a whole lot of pressure getting to the area to cause a significant issue and passing out in the driveway could have definitely done that. She has been seeing Dr. Allena Katz and right now they have been using Betadine. Overall I think that we probably need to try something a little bit different to see if we can  get this moving in a better direction. With that being said the patient does have a few other medical problems that would be detailed below. Of again she did have peripheral vascular disease and subsequently she has had a bypass surgery on the extremity to restore blood flow and her most recent arterial study shows that she does have noncompressible ABIs bilaterally with a right TBI  of 0.69 and a left TBI of 0.80 again this seems to be doing decently well. I am pleased in that regard. She does have a history of hypertension, chronic kidney disease though I do not know what stage, and generalized muscle weakness. Electronic Signature(s) Signed: 02/27/2021 4:15:00 PM By: Lenda Kelp PA-C Entered By: Lenda Kelp on 02/27/2021 16:15:00 Scheiber, [name] ([id number]) -------------------------------------------------------------------------------- Physical Exam Details Patient Name: Jody Lowe Date of Service: 02/27/2021 10:00 AM Medical Record Number: [id number] Patient Account Number: [id number] Date of Birth/Sex: Aug 12, 1945 (75 y.o. F) Treating RN: Hansel Feinstein Primary Care Provider: Jeffie Pollock Other Clinician: Referring Provider: Nicholes Rough Treating Provider/Extender: Rowan Blase in Treatment: 0 Constitutional patient is hypertensive.. pulse regular and within target range for patient.Marland Kitchen respirations regular, non-labored and within target range for patient.Marland Kitchen temperature within target range for patient.. Well-nourished and well-hydrated in no acute distress. Eyes conjunctiva clear no eyelid edema noted. pupils equal round and reactive to light and accommodation. Ears, Nose, Mouth, and Throat no gross abnormality of ear auricles or external auditory canals. normal hearing noted during conversation. mucus membranes moist. Respiratory normal breathing without difficulty. Cardiovascular 1+ dorsalis pedis/posterior tibialis pulses. no clubbing, cyanosis, significant  edema, <3 sec cap refill. Musculoskeletal normal gait and posture. no significant deformity or arthritic changes, no loss or range of motion, no clubbing. Psychiatric this patient is able to make decisions and demonstrates good insight into disease process. Alert and Oriented x 3. pleasant and cooperative. Notes Upon inspection patient's wound bed is can require some debridement currently to clear with some of the necrotic debris. I discussed that with the patient today. She is in agreement with that plan the good news that she is not having a whole lot of pain which is a good sign as far as what we need to do debridement wise but not so great as far as this potentially could be part of the reason why she is having issues she is experiencing to begin with. She has not had an x-ray that I can find in the system therefore I am going to send her for an x-ray as well of this foot to further evaluate in that regard. Electronic Signature(s) Signed: 02/27/2021 4:16:15 PM By: Lenda Kelp PA-C Entered By: Lenda Kelp on 02/27/2021 16:16:15 [place], [name] ([id number]) -------------------------------------------------------------------------------- Physician Orders Details Patient Name: Jody Lowe Date of Service: 02/27/2021 10:00 AM Medical Record Number: [id number] Patient Account Number: [id number] Date of Birth/Sex: 02-11-1946 (75 y.o. F) Treating RN: Hansel Feinstein Primary Care Provider: Jeffie Pollock Other Clinician: Referring Provider: Nicholes Rough Treating Provider/Extender: Rowan Blase in Treatment: 0 Verbal / Phone Orders: No Diagnosis Coding ICD-10 Coding Code Description I73.89 Other specified peripheral vascular diseases 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, stage 3 I10 Essential (primary) hypertension N18.9 Chronic kidney disease, unspecified M62.81 Muscle weakness (generalized) Follow-up Appointments o Return Appointment in 1 week. o Nurse Visit as  needed Home Health o Home Health Company: - [facility] o ADMIT to Home Health for wound care. May utilize formulary equivalent dressing for wound treatment orders unless otherwise specified. Home Health Nurse may visit PRN to address patientos wound care needs. o CONTINUE Home Health for wound care. May utilize formulary equivalent dressing for wound treatment orders unless otherwise specified. Home Health Nurse may visit PRN to address patientos wound care needs. o Scheduled days for dressing changes to be completed; exception, patient has scheduled wound care visit that day. o **Please direct any NON-WOUND related  issues/requests for orders to patient's Primary Care Physician. **If current dressing causes regression in wound condition, may D/C ordered dressing product/s and apply Normal Saline Moist Dressing daily until next Wound Healing Center or Other MD appointment. **Notify Wound Healing Center of regression in wound condition at [phone]. Bathing/ Shower/ Hygiene o Wash wounds with antibacterial soap and water. - Keep dressing dry or change after shower o No tub bath. Anesthetic (Use 'Patient Medications' Section for Anesthetic Order Entry) o Lidocaine applied to wound bed Edema Control - Lymphedema / Segmental Compressive Device / Other o Elevate leg(s) parallel to the floor when sitting. o DO YOUR BEST to sleep in the bed at night. DO NOT sleep in your recliner. Long hours of sitting in a recliner leads to swelling of the legs and/or potential wounds on your backside. Off-Loading o Open toe surgical shoe - Or alternative/keep pressure off of wound o Turn and reposition every 2 hours Additional Orders / Instructions o Decrease/Stop Smoking o Follow Nutritious Diet and Increase Protein Intake Wound Treatment Wound #1 - Metatarsal head first Wound Laterality: Right, Medial Cleanser: Normal Saline 3 x Per Week/15 Days Discharge Instructions: Wash your  hands with soap and water. Remove old dressing, discard into plastic bag and place into trash. Cleanse the wound with Normal Saline prior to applying a clean dressing using gauze sponges, not tissues or cotton balls. Do not scrub or use excessive force. Pat dry using gauze sponges, not tissue or cotton balls. Cleanser: Soap and Water 3 x Per Week/15 Days Discharge Instructions: Gently cleanse wound with antibacterial soap, rinse and pat dry prior to dressing wounds Primary Dressing: IODOFLEX 0.9% Cadexomer Iodine Pad 3 x Per Week/15 Days Ferreras, [name] ([id number]) Discharge Instructions: Apply Iodoflex to wound bed only as directed. Secondary Dressing: Zetuvit Plus Silicone Border Dressing 4x4 (in/in) 3 x Per Week/15 Days Radiology o X-ray, foot - Take order and get X-ray of right foot Electronic Signature(s) Signed: 02/27/2021 12:02:03 PM By: Hansel Feinstein Signed: 02/27/2021 4:19:41 PM By: Lenda Kelp PA-C Previous Signature: 02/27/2021 11:26:57 AM Version By: Hansel Feinstein Entered By: Hansel Feinstein on 02/27/2021 11:59:48 Schepp, [name] ([id number]) -------------------------------------------------------------------------------- Problem List Details Patient Name: Jody Lowe Date of Service: 02/27/2021 10:00 AM Medical Record Number: [id number] Patient Account Number: [id number] Date of Birth/Sex: Aug 10, 1945 (75 y.o. F) Treating RN: Hansel Feinstein Primary Care Provider: Jeffie Pollock Other Clinician: Referring Provider: Nicholes Rough Treating Provider/Extender: Rowan Blase in Treatment: 0 Active Problems ICD-10 Encounter Code Description Active Date MDM Diagnosis I73.89 Other specified peripheral vascular diseases 02/27/2021 No Yes 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, stage 3 02/27/2021 No Yes I10 Essential (primary) hypertension 02/27/2021 No Yes N18.9 Chronic kidney disease, unspecified 02/27/2021 No Yes M62.81 Muscle weakness (generalized) 02/27/2021 No Yes Inactive  Problems Resolved Problems Electronic Signature(s) Signed: 02/27/2021 10:56:49 AM By: Lenda Kelp PA-C Entered By: Lenda Kelp on 02/27/2021 10:56:48 Buchler, [name] ([id number]) -------------------------------------------------------------------------------- Progress Note Details Patient Name: Jody Lowe Date of Service: 02/27/2021 10:00 AM Medical Record Number: [id number] Patient Account Number: [id number] Date of Birth/Sex: 1945-08-06 (75 y.o. F) Treating RN: Hansel Feinstein Primary Care Provider: Jeffie Pollock Other Clinician: Referring Provider: Nicholes Rough Treating Provider/Extender: Rowan Blase in Treatment: 0 Subjective Chief Complaint Information obtained from Patient Right Foot Ulcer History of Present Illness (HPI) 02/27/2021 upon evaluation today patient presents for inspection in our office today concerning issues that she has been having over the right medial first metatarsal head. This is actually  a pressure ulcer that I think developed as result of the patient actually passing out in her driveway and this incident occurred around the beginning of September 2022. She ended up October 10 having bypass surgery in regard to her leg to restore blood flow to the extremity and subsequently I think that this probably contributed to the wound development as with limited blood flow she would not have needed a whole lot of pressure getting to the area to cause a significant issue and passing out in the driveway could have definitely done that. She has been seeing Dr. Allena Katz and right now they have been using Betadine. Overall I think that we probably need to try something a little bit different to see if we can get this moving in a better direction. With that being said the patient does have a few other medical problems that would be detailed below. Of again she did have peripheral vascular disease and subsequently she has had a bypass surgery on the extremity to  restore blood flow and her most recent arterial study shows that she does have noncompressible ABIs bilaterally with a right TBI of 0.69 and a left TBI of 0.80 again this seems to be doing decently well. I am pleased in that regard. She does have a history of hypertension, chronic kidney disease though I do not know what stage, and generalized muscle weakness. Patient History Information obtained from Patient. Allergies Plavix (Severity: Moderate), gabapentin (Severity: Moderate, Reaction: confusion) Social History Current every day smoker, Marital Status - Single, Alcohol Use - Never, Drug Use - No History, Caffeine Use - Daily. Medical History Eyes Patient has history of Cataracts - surgery Hematologic/Lymphatic Patient has history of Anemia Respiratory Patient has history of Asthma - o/PRN inhaler Cardiovascular Patient has history of Coronary Artery Disease, Deep Vein Thrombosis, Hypertension Musculoskeletal Patient has history of Gout, Osteoarthritis Review of Systems (ROS) Constitutional Symptoms (General Health) Denies complaints or symptoms of Fatigue, Fever, Chills, Marked Weight Change. Ear/Nose/Mouth/Throat Complains or has symptoms of Sinusitis. Denies complaints or symptoms of Difficult clearing ears. Respiratory Complains or has symptoms of Shortness of Breath - with exertion. Denies complaints or symptoms of Chronic or frequent coughs. Gastrointestinal stated GI bleed hx Endocrine Denies complaints or symptoms of Hepatitis, Thyroid disease, Polydypsia (Excessive Thirst). Genitourinary stated not that she knows of-noted on dx renal disorder Immunological Denies complaints or symptoms of Hives, Itching. Integumentary (Skin) Denies complaints or symptoms of Wounds, Bleeding or bruising tendency, Breakdown, Swelling. Neurologic Denies complaints or symptoms of Numbness/parasthesias, Focal/Weakness. Psychiatric Denies complaints or symptoms of Anxiety,  Claustrophobia. [place], Jody Lowe ([id number]) Objective Constitutional patient is hypertensive.. pulse regular and within target range for patient.Marland Kitchen respirations regular, non-labored and within target range for patient.Marland Kitchen temperature within target range for patient.. Well-nourished and well-hydrated in no acute distress. Vitals Time Taken: 9:58 AM, Height: 66 in, Source: Stated, Weight: 199 lbs, Source: Stated, BMI: 32.1, Temperature: 98.2 F, Pulse: 84 bpm, Respiratory Rate: 16 breaths/min, Blood Pressure: 141/63 mmHg. General Notes: stated last weight at MD office and unable to safely stay on scale Eyes conjunctiva clear no eyelid edema noted. pupils equal round and reactive to light and accommodation. Ears, Nose, Mouth, and Throat no gross abnormality of ear auricles or external auditory canals. normal hearing noted during conversation. mucus membranes moist. Respiratory normal breathing without difficulty. Cardiovascular 1+ dorsalis pedis/posterior tibialis pulses. no clubbing, cyanosis, significant edema, Musculoskeletal normal gait and posture. no significant deformity or arthritic changes, no loss or range of motion, no  clubbing. Psychiatric this patient is able to make decisions and demonstrates good insight into disease process. Alert and Oriented x 3. pleasant and cooperative. General Notes: Upon inspection patient's wound bed is can require some debridement currently to clear with some of the necrotic debris. I discussed that with the patient today. She is in agreement with that plan the good news that she is not having a whole lot of pain which is a good sign as far as what we need to do debridement wise but not so great as far as this potentially could be part of the reason why she is having issues she is experiencing to begin with. She has not had an x-ray that I can find in the system therefore I am going to send her for an x-ray as well of this foot to further evaluate in that  regard. Integumentary (Hair, Skin) Wound #1 status is Open. Original cause of wound was Blister. The date acquired was: 11/23/2020. The wound is located on the Right,Medial Metatarsal head first. The wound measures 0.4cm length x 0.4cm width x 0.5cm depth; 0.126cm^2 area and 0.063cm^3 volume. There is Fat Layer (Subcutaneous Tissue) exposed. There is no tunneling noted, however, there is undermining starting at 12:00 and ending at 12:00 with a maximum distance of 0.4cm. There is a medium amount of serosanguineous drainage noted. The wound margin is thickened. There is no granulation within the wound bed. There is a large (67-100%) amount of necrotic tissue within the wound bed including Adherent Slough. Assessment Active Problems ICD-10 Other specified peripheral vascular diseases Pressure ulcer of other site, stage 3 Essential (primary) hypertension Chronic kidney disease, unspecified Muscle weakness (generalized) Procedures Wound #1 Pre-procedure diagnosis of Wound #1 is a Pressure Ulcer located on the Right,Medial Metatarsal head first .Severity of Tissue Pre Debridement is: Fat layer exposed. There was a Excisional Skin/Subcutaneous Tissue Debridement with a total area of 0.36 sq cm performed by Nelida Meuse., Nydam, [name] ([id number]) PA-C. With the following instrument(s): Curette to remove Viable and Non-Viable tissue/material. Material removed includes Callus, Subcutaneous Tissue, and Slough after achieving pain control using Lidocaine. A time out was conducted at 11:03, prior to the start of the procedure. A Moderate amount of bleeding was controlled with Pressure. The procedure was tolerated well. Post Debridement Measurements: 0.6cm length x 0.5cm width x 0.5cm depth; 0.118cm^3 volume. Post debridement Stage noted as Category/Stage III. Character of Wound/Ulcer Post Debridement is improved. Severity of Tissue Post Debridement is: Fat layer exposed. Post procedure Diagnosis Wound  #1: Same as Pre-Procedure Plan Follow-up Appointments: Return Appointment in 1 week. Nurse Visit as needed Home Health: Home Health Company: - [facility] ADMIT to Home Health for wound care. May utilize formulary equivalent dressing for wound treatment orders unless otherwise specified. Home Health Nurse may visit PRN to address patient s wound care needs. CONTINUE Home Health for wound care. May utilize formulary equivalent dressing for wound treatment orders unless otherwise specified. Home Health Nurse may visit PRN to address patient s wound care needs. Scheduled days for dressing changes to be completed; exception, patient has scheduled wound care visit that day. **Please direct any NON-WOUND related issues/requests for orders to patient's Primary Care Physician. **If current dressing causes regression in wound condition, may D/C ordered dressing product/s and apply Normal Saline Moist Dressing daily until next Wound Healing Center or Other MD appointment. **Notify Wound Healing Center of regression in wound condition at [phone]. Bathing/ Shower/ Hygiene: Wash wounds with antibacterial soap and water. -  Keep dressing dry or change after shower No tub bath. Anesthetic (Use 'Patient Medications' Section for Anesthetic Order Entry): Lidocaine applied to wound bed Edema Control - Lymphedema / Segmental Compressive Device / Other: Elevate leg(s) parallel to the floor when sitting. DO YOUR BEST to sleep in the bed at night. DO NOT sleep in your recliner. Long hours of sitting in a recliner leads to swelling of the legs and/or potential wounds on your backside. Off-Loading: Open toe surgical shoe - Or alternative/keep pressure off of wound Turn and reposition every 2 hours Additional Orders / Instructions: Decrease/Stop Smoking Follow Nutritious Diet and Increase Protein Intake Radiology ordered were: X-ray, foot - Take order and get X-ray of right foot WOUND #1: - Metatarsal head  first Wound Laterality: Right, Medial Cleanser: Normal Saline 3 x Per Week/15 Days Discharge Instructions: Wash your hands with soap and water. Remove old dressing, discard into plastic bag and place into trash. Cleanse the wound with Normal Saline prior to applying a clean dressing using gauze sponges, not tissues or cotton balls. Do not scrub or use excessive force. Pat dry using gauze sponges, not tissue or cotton balls. Cleanser: Soap and Water 3 x Per Week/15 Days Discharge Instructions: Gently cleanse wound with antibacterial soap, rinse and pat dry prior to dressing wounds Primary Dressing: IODOFLEX 0.9% Cadexomer Iodine Pad 3 x Per Week/15 Days Discharge Instructions: Apply Iodoflex to wound bed only as directed. Secondary Dressing: Zetuvit Plus Silicone Border Dressing 4x4 (in/in) 3 x Per Week/15 Days 1. I am getting go ahead currently based on what I am seeing and initiate treatment with a Iodoflex dressing which I think is probably can be the best way to go and the patient is in agreement with that plan. 2. I am also can recommend we use a Zetuvit border foam dressing to cover. 3. I would also suggest that she have this changed 3 times per week. She does have home health so that should be beneficial for her as well. 4. We will also continue to monitor for any signs of worsening or infection I am going to send her as well for a x-ray which I think is going to be ideal in order to ensure there is no signs of bone infection. We will see patient back for reevaluation in 1 week here in the clinic. If anything worsens or changes patient will contact our office for additional recommendations. Electronic Signature(s) Signed: 02/27/2021 4:17:01 PM By: Lenda Kelp PA-C Entered By: Lenda Kelp on 02/27/2021 16:17:00 Pe[place]ham, [name] ([id number]) -------------------------------------------------------------------------------- ROS/PFSH Details Patient Name: Jody Lowe Date of  Service: 02/27/2021 10:00 AM Medical Record Number: [id number] Patient Account Number: [id number] Date of Birth/Sex: 07-24-1945 (75 y.o. F) Treating RN: Hansel Feinstein Primary Care Provider: Jeffie Pollock Other Clinician: Referring Provider: Nicholes Rough Treating Provider/Extender: Rowan Blase in Treatment: 0 Information Obtained From Patient Constitutional Symptoms (General Health) Complaints and Symptoms: Negative for: Fatigue; Fever; Chills; Marked Weight Change Ear/Nose/Mouth/Throat Complaints and Symptoms: Positive for: Sinusitis Negative for: Difficult clearing ears Respiratory Complaints and Symptoms: Positive for: Shortness of Breath - with exertion Negative for: Chronic or frequent coughs Medical History: Positive for: Asthma - o/PRN inhaler Endocrine Complaints and Symptoms: Negative for: Hepatitis; Thyroid disease; Polydypsia (Excessive Thirst) Immunological Complaints and Symptoms: Negative for: Hives; Itching Integumentary (Skin) Complaints and Symptoms: Negative for: Wounds; Bleeding or bruising tendency; Breakdown; Swelling Neurologic Complaints and Symptoms: Negative for: Numbness/parasthesias; Focal/Weakness Psychiatric Complaints and Symptoms: Negative for: Anxiety; Claustrophobia Eyes Medical History:  Positive for: Cataracts - surgery Hematologic/Lymphatic Medical History: Positive for: Anemia Duthie, [name] ([id number]) Cardiovascular Medical History: Positive for: Coronary Artery Disease; Deep Vein Thrombosis; Hypertension Gastrointestinal Complaints and Symptoms: Review of System Notes: stated GI bleed hx Genitourinary Complaints and Symptoms: Review of System Notes: stated not that she knows of-noted on dx renal disorder Musculoskeletal Medical History: Positive for: Gout; Osteoarthritis Oncologic HBO Extended History Items Eyes: Cataracts Immunizations Pneumococcal Vaccine: Received Pneumococcal Vaccination:  Yes Received Pneumococcal Vaccination On or After 60th Birthday: Yes Implantable Devices None Family and Social History Current every day smoker; Marital Status - Single; Alcohol Use: Never; Drug Use: No History; Caffeine Use: Daily; Financial Concerns: No; Food, Clothing or Shelter Needs: No; Support System Lacking: No; Transportation Concerns: No Electronic Signature(s) Signed: 02/27/2021 12:02:03 PM By: Hansel Feinstein Signed: 02/27/2021 4:19:41 PM By: Lenda Kelp PA-C Entered By: Hansel Feinstein on 02/27/2021 10:34:08 Stoecker, [name] ([id number]) -------------------------------------------------------------------------------- SuperBill Details Patient Name: Jody Lowe Date of Service: 02/27/2021 Medical Record Number: [id number] Patient Account Number: [id number] Date of Birth/Sex: May 05, 1945 (75 y.o. 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, stage 3 I10 Essential (primary) hypertension N18.9 Chronic kidney disease, unspecified M62.81 Muscle weakness (generalized) Facility Procedures CPT4 Code: [id number] Description: 99213 - WOUND CARE VISIT-LEV 3 EST PT Modifier: Quantity: 1 CPT4 Code: [id number] Description: 11042 - DEB SUBQ TISSUE 20 SQ CM/< Modifier: Quantity: 1 CPT4 Code: Description: ICD-10 Diagnosis Description 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, stage 3 Modifier: Quantity: Physician Procedures CPT4 Code: [id number] Description: 99204 - WC PHYS LEVEL 4 - NEW PT Modifier: 25 Quantity: 1 CPT4 Code: Description: ICD-10 Diagnosis Description I73.89 Other specified peripheral vascular diseases 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

## 2021-03-01 ENCOUNTER — Other Ambulatory Visit: Payer: Self-pay | Admitting: Physician Assistant

## 2021-03-01 DIAGNOSIS — L89893 Pressure ulcer of other site, stage 3: Secondary | ICD-10-CM

## 2021-03-06 ENCOUNTER — Ambulatory Visit: Payer: Medicare Other | Admitting: Podiatry

## 2021-03-07 ENCOUNTER — Other Ambulatory Visit: Payer: Self-pay

## 2021-03-07 ENCOUNTER — Ambulatory Visit
Admission: RE | Admit: 2021-03-07 | Discharge: 2021-03-07 | Disposition: A | Discharge status: Home / Self Care | Payer: Medicare Other | Source: Ambulatory Visit | Attending: Physician Assistant | Admitting: Physician Assistant

## 2021-03-07 DIAGNOSIS — L89893 Pressure ulcer of other site, stage 3: Secondary | ICD-10-CM

## 2021-03-09 ENCOUNTER — Other Ambulatory Visit
Admission: RE | Admit: 2021-03-09 | Discharge: 2021-03-09 | Disposition: A | Discharge status: Home / Self Care | Payer: Medicare Other | Source: Ambulatory Visit | Attending: *Deleted | Admitting: *Deleted

## 2021-03-09 ENCOUNTER — Encounter: Payer: Medicare Other | Admitting: Physician Assistant

## 2021-03-09 ENCOUNTER — Other Ambulatory Visit: Payer: Self-pay

## 2021-03-09 DIAGNOSIS — B999 Unspecified infectious disease: Secondary | ICD-10-CM | POA: Insufficient documentation

## 2021-03-09 DIAGNOSIS — L89893 Pressure ulcer of other site, stage 3: Secondary | ICD-10-CM | POA: Diagnosis not present

## 2021-03-09 NOTE — Progress Notes (Addendum)
SHRIKA, MILOS ([id number]) Visit Report for 03/09/2021 Chief Complaint Document Details Patient Name: Jody Lowe, Jody Lowe. Date of Service: 03/09/2021 1:00 PM Medical Record Number: [id number] Patient Account Number: [id number] Date of Birth/Sex: 1946/03/08 (75 y.o. F) Treating RN: Hansel Feinstein Primary Care Provider: Jeffie Pollock Other Clinician: Referring Provider: Jeffie Pollock Treating Provider/Extender: Rowan Blase in Treatment: 1 Information Obtained from: Patient Chief Complaint Right Foot Ulcer Electronic Signature(s) Signed: 03/09/2021 1:04:44 PM By: Lenda Kelp PA-C Entered By: Lenda Kelp on 03/09/2021 13:04:44 Bidwell, [name] ([id number]) -------------------------------------------------------------------------------- Debridement Details Patient Name: Jody Lowe Date of Service: 03/09/2021 1:00 PM Medical Record Number: [id number] Patient Account Number: [id number] Date of Birth/Sex: 02-04-1946 (75 y.o. F) Treating RN: Hansel Feinstein Primary Care Provider: Jeffie Pollock Other Clinician: Referring Provider: Jeffie Pollock Treating Provider/Extender: Rowan Blase in Treatment: 1 Debridement Performed for Wound #1 Right,Medial Metatarsal head first Assessment: Performed By: Physician Nelida Meuse., PA-C Debridement Type: Debridement Severity of Tissue Pre Debridement: Fat layer exposed Level of Consciousness (Pre- Awake and Alert procedure): Pre-procedure Verification/Time Out Yes - 13:27 Taken: Start Time: 13:28 Pain Control: Lidocaine Total Area Debrided (L x W): 0.5 (cm) x 0.5 (cm) = 0.25 (cm) Tissue and other material Viable, Non-Viable, Callus, Slough, Subcutaneous, Slough debrided: Level: Skin/Subcutaneous Tissue Debridement Description: Excisional Instrument: Curette Bleeding: Minimum Hemostasis Achieved: Pressure Response to Treatment: Procedure was tolerated well Level of Consciousness (Post- Awake and  Alert procedure): Post Debridement Measurements of Total Wound Length: (cm) 0.5 Stage: Category/Stage III Width: (cm) 0.5 Depth: (cm) 0.6 Volume: (cm) 0.118 Character of Wound/Ulcer Post Debridement: Improved Severity of Tissue Post Debridement: Fat layer exposed Post Procedure Diagnosis Same as Pre-procedure Electronic Signature(s) Signed: 03/09/2021 1:47:29 PM By: Hansel Feinstein Signed: 03/09/2021 5:41:04 PM By: Lenda Kelp PA-C Entered By: Hansel Feinstein on 03/09/2021 13:32:04 Yehle, [name] ([id number]) -------------------------------------------------------------------------------- HPI Details Patient Name: Jody Lowe Date of Service: 03/09/2021 1:00 PM Medical Record Number: [id number] Patient Account Number: [id number] Date of Birth/Sex: 08/24/1945 (75 y.o. F) Treating RN: Hansel Feinstein Primary Care Provider: Jeffie Pollock Other Clinician: Referring Provider: Jeffie Pollock Treating Provider/Extender: Rowan Blase in Treatment: 1 History of Present Illness HPI Description: 02/27/2021 upon evaluation today patient presents for inspection in our office today concerning issues that she has been having over the right medial first metatarsal head. This is actually a pressure ulcer that I think developed as result of the patient actually passing out in her driveway and this incident occurred around the beginning of September 2022. She ended up October 10 having bypass surgery in regard to her leg to restore blood flow to the extremity and subsequently I think that this probably contributed to the wound development as with limited blood flow she would not have needed a whole lot of pressure getting to the area to cause a significant issue and passing out in the driveway could have definitely done that. She has been seeing Dr. Allena Katz and right now they have been using Betadine. Overall I think that we probably need to try something a little bit different to see if  we can get this moving in a better direction. With that being said the patient does have a few other medical problems that would be detailed below. Of again she did have peripheral vascular disease and subsequently she has had a bypass surgery on the extremity to restore blood flow and her most recent arterial study shows that she does have noncompressible ABIs bilaterally with a right TBI  of 0.69 and a left TBI of 0.80 again this seems to be doing decently well. I am pleased in that regard. She does have a history of hypertension, chronic kidney disease though I do not know what stage, and generalized muscle weakness. 03/09/2021 upon evaluation today patient appears to be doing about the same in regard to her foot ulcer. Notices the redness has not spread and things seem to be making progress here. Unfortunately she does still have some necrotic tissue in the center part of the wound she does have good arterial flow as noted by her arterial studies that we got back. Fortunately I think that we are good to proceed with some debridement here. I also think that she may be a candidate for hyperbaric oxygen therapy depending on how things go. Electronic Signature(s) Signed: 03/09/2021 4:55:07 PM By: Lenda Kelp PA-C Entered By: Lenda Kelp on 03/09/2021 16:55:06 Averitt, [name] ([id number]) -------------------------------------------------------------------------------- Physical Exam Details Patient Name: Jody Lowe Date of Service: 03/09/2021 1:00 PM Medical Record Number: [id number] Patient Account Number: [id number] Date of Birth/Sex: 1945/08/23 (75 y.o. F) Treating RN: Hansel Feinstein Primary Care Provider: Jeffie Pollock Other Clinician: Referring Provider: Jeffie Pollock Treating Provider/Extender: Rowan Blase in Treatment: 1 Constitutional Well-nourished and well-hydrated in no acute distress. Respiratory normal breathing without difficulty. Psychiatric this  patient is able to make decisions and demonstrates good insight into disease process. Alert and Oriented x 3. pleasant and cooperative. Notes Upon inspection patient's wound bed did require sharp debridement I did actually perform debridement and then postdebridement was able to obtain a culture to check for bacteria so I can target treatment as needed. For now we will place her on doxycycline. Electronic Signature(s) Signed: 03/09/2021 4:55:22 PM By: Lenda Kelp PA-C Entered By: Lenda Kelp on 03/09/2021 16:55:22 Morsch, [name] ([id number]) -------------------------------------------------------------------------------- Physician Orders Details Patient Name: Jody Lowe Date of Service: 03/09/2021 1:00 PM Medical Record Number: [id number] Patient Account Number: [id number] Date of Birth/Sex: October 26, 1945 (75 y.o. 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 o Nurse Visit as needed Home Health o Home Health Company: - [facility] o ADMIT to Home Health for wound care. May utilize formulary equivalent dressing for wound treatment orders unless otherwise specified. Home Health Nurse may visit PRN to address patientos wound care needs. o CONTINUE Home Health for wound care. May utilize formulary equivalent dressing for wound treatment orders unless otherwise specified. Home Health Nurse may visit PRN to address patientos wound care needs. o Scheduled days for dressing changes to be  completed; exception, patient has scheduled wound care visit that day. o **Please direct any NON-WOUND related issues/requests for orders to patient's Primary Care Physician. **If current dressing causes regression in wound condition, may D/C ordered dressing product/s and apply Normal Saline Moist Dressing daily until next Wound Healing Center or Other MD appointment. **Notify Wound Healing Center of regression in wound condition at [phone]. Bathing/ Shower/ Hygiene o Wash wounds with antibacterial soap and water. - Keep dressing dry or change after shower o No tub bath. Anesthetic (Use 'Patient Medications' Section for Anesthetic Order Entry) o Lidocaine applied to  wound bed Edema Control - Lymphedema / Segmental Compressive Device / Other o Elevate leg(s) parallel to the floor when sitting. o DO YOUR BEST to sleep in the bed at night. DO NOT sleep in your recliner. Long hours of sitting in a recliner leads to swelling of the legs and/or potential wounds on your backside. Off-Loading o Open toe surgical shoe - Or alternative/keep pressure off of wound o Turn and reposition every 2 hours Additional Orders / Instructions o Decrease/Stop Smoking o Follow Nutritious Diet and Increase Protein Intake Medications-Please add to medication list. o P.O. Antibiotics - Pick up at pharmacy and start them today and take as prescribed Wound Treatment Wound #1 - Metatarsal head first Wound Laterality: Right, Medial Cleanser: Normal Saline 3 x Per Week/15 Days Discharge Instructions: Wash your hands with soap and water. Remove old dressing, discard into plastic bag and place into trash. Cleanse the wound with Normal Saline prior to applying a clean dressing using gauze sponges, not tissues or cotton balls. Do not scrub or use excessive force. Pat dry using gauze sponges, not tissue or cotton balls. Cleanser: Soap and Water 3 x Per Week/15 Days Borror, JULLIE ARPS  ([id number]) Discharge Instructions: Gently cleanse wound with antibacterial soap, rinse and pat dry prior to dressing wounds Primary Dressing: IODOFLEX 0.9% Cadexomer Iodine Pad 3 x Per Week/15 Days Discharge Instructions: Apply Iodoflex to wound bed only as directed. Secondary Dressing: Zetuvit Plus Silicone Border Dressing 4x4 (in/in) 3 x Per Week/15 Days Laboratory o Bacteria identified in Wound by Culture (MICRO) - right foot - (ICD10 L89.893 - Pressure ulcer of other site, stage 3) oooo LOINC Code: 6462-6 oooo Convenience Name: Wound culture routine Patient Medications Allergies: Plavix, gabapentin Notifications Medication Indication Start End doxycycline hyclate 03/09/2021 DOSE 1 - oral 100 mg capsule - 1 capsule oral taken 2 times per day for 30 days. Do not take iron within 2 hours of taking this medication before or after Electronic Signature(s) Signed: 03/09/2021 4:57:16 PM By: Lenda Kelp PA-C Previous Signature: 03/09/2021 1:47:29 PM Version By: Hansel Feinstein Entered By: Lenda Kelp on 03/09/2021 16:57:16 Bisesi, [name] ([id number]) -------------------------------------------------------------------------------- Problem List Details Patient Name: Jody Lowe Date of Service: 03/09/2021 1:00 PM Medical Record Number: [id number] Patient Account Number: [id number] Date of Birth/Sex: [birth year] (75 y.o. 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, stage 3 02/27/2021 No Yes I10 Essential (primary) hypertension 02/27/2021 No Yes N18.9 Chronic kidney disease, unspecified 02/27/2021 No Yes M62.81 Muscle weakness (generalized) 02/27/2021 No  Yes Inactive Problems Resolved Problems Electronic Signature(s) Signed: 03/09/2021 1:04:39 PM By: Lenda Kelp PA-C Entered By: Lenda Kelp on 03/09/2021 13:04:39 Rinke, [name] ([id number]) -------------------------------------------------------------------------------- Progress Note Details Patient Name: Jody Lowe Date of Service: 03/09/2021 1:00 PM Medical Record Number: [id number] Patient Account Number: [id number] Date of Birth/Sex: 1945-08-16 (75 y.o. F) Treating RN: Hansel Feinstein Primary Care Provider: Jeffie Pollock Other Clinician: Referring Provider: Jeffie Pollock Treating Provider/Extender: Rowan Blase in Treatment: 1 Subjective Chief Complaint Information obtained from Patient Right Foot Ulcer History of Present Illness (HPI) 02/27/2021 upon evaluation today patient presents for inspection in our office today concerning issues that she has been having over the right medial first metatarsal head. This is actually a pressure ulcer  that I think developed as result of the patient actually passing out in her driveway and this incident occurred around the beginning of September 2022. She ended up October 10 having bypass surgery in regard to her leg to restore blood flow to the extremity and subsequently I think that this probably contributed to the wound development as with limited blood flow she would not have needed a whole lot of pressure getting to the area to cause a significant issue and passing out in the driveway could have definitely done that. She has been seeing Dr. Allena Katz and right now they have been using Betadine. Overall I think that we probably need to try something a little bit different to see if we can get this moving in a better direction. With that being said the patient does have a few other medical problems that would be detailed below. Of again she did have peripheral vascular disease and subsequently she has had a bypass  surgery on the extremity to restore blood flow and her most recent arterial study shows that she does have noncompressible ABIs bilaterally with a right TBI of 0.69 and a left TBI of 0.80 again this seems to be doing decently well. I am pleased in that regard. She does have a history of hypertension, chronic kidney disease though I do not know what stage, and generalized muscle weakness. 03/09/2021 upon evaluation today patient appears to be doing about the same in regard to her foot ulcer. Notices the redness has not spread and things seem to be making progress here. Unfortunately she does still have some necrotic tissue in the center part of the wound she does have good arterial flow as noted by her arterial studies that we got back. Fortunately I think that we are good to proceed with some debridement here. I also think that she may be a candidate for hyperbaric oxygen therapy depending on how things go. Objective Constitutional Well-nourished and well-hydrated in no acute distress. Vitals Time Taken: 1:05 PM, Height: 66 in, Weight: 199 lbs, BMI: 32.1, Temperature: 98.1 F, Pulse: 81 bpm, Respiratory Rate: 16 breaths/min, Blood Pressure: 148/76 mmHg. Respiratory normal breathing without difficulty. Psychiatric this patient is able to make decisions and demonstrates good insight into disease process. Alert and Oriented x 3. pleasant and cooperative. General Notes: Upon inspection patient's wound bed did require sharp debridement I did actually perform debridement and then postdebridement was able to obtain a culture to check for bacteria so I can target treatment as needed. For now we will place her on doxycycline. Integumentary (Hair, Skin) Wound #1 status is Open. Original cause of wound was Blister. The date acquired was: 11/23/2020. The wound has been in treatment 1 weeks. The wound is located on the Right,Medial Metatarsal head first. The wound measures 0.5cm length x 0.5cm width x 0.6cm  depth; 0.196cm^2 area and 0.118cm^3 volume. There is Fat Layer (Subcutaneous Tissue) exposed. There is no tunneling noted, however, there is undermining starting at 12:00 and ending at 12:00 with a maximum distance of 0.5cm. There is a medium amount of serosanguineous drainage noted. The wound margin is thickened. There is no granulation within the wound bed. There is a large (67-100%) amount of necrotic tissue within the wound bed including Adherent Slough. Dugo, [name] ([id number]) Assessment Active Problems ICD-10 Other specified peripheral vascular diseases Pressure ulcer of other site, stage 3 Essential (primary) hypertension Chronic kidney disease, unspecified Muscle weakness (generalized) Procedures Wound #1 Pre-procedure diagnosis of Wound #1 is a Pressure Ulcer  located on the Right,Medial Metatarsal head first .Severity of Tissue Pre Debridement is: Fat layer exposed. There was a Excisional Skin/Subcutaneous Tissue Debridement with a total area of 0.25 sq cm performed by Nelida Meuse., PA-C. With the following instrument(s): Curette to remove Viable and Non-Viable tissue/material. Material removed includes Callus, Subcutaneous Tissue, and Slough after achieving pain control using Lidocaine. A time out was conducted at 13:27, prior to the start of the procedure. A Minimum amount of bleeding was controlled with Pressure. The procedure was tolerated well. Post Debridement Measurements: 0.5cm length x 0.5cm width x 0.6cm depth; 0.118cm^3 volume. Post debridement Stage noted as Category/Stage III. Character of Wound/Ulcer Post Debridement is improved. Severity of Tissue Post Debridement is: Fat layer exposed. Post procedure Diagnosis Wound #1: Same as Pre-Procedure Plan Follow-up Appointments: Return Appointment in 1 week. Nurse Visit as needed Home Health: Home Health Company: - [facility] ADMIT to Home Health for wound care. May utilize formulary equivalent dressing for wound  treatment orders unless otherwise specified. Home Health Nurse may visit PRN to address patient s wound care needs. CONTINUE Home Health for wound care. May utilize formulary equivalent dressing for wound treatment orders unless otherwise specified. Home Health Nurse may visit PRN to address patient s wound care needs. Scheduled days for dressing changes to be completed; exception, patient has scheduled wound care visit that day. **Please direct any NON-WOUND related issues/requests for orders to patient's Primary Care Physician. **If current dressing causes regression in wound condition, may D/C ordered dressing product/s and apply Normal Saline Moist Dressing daily until next Wound Healing Center or Other MD appointment. **Notify Wound Healing Center of regression in wound condition at [phone]. Bathing/ Shower/ Hygiene: Wash wounds with antibacterial soap and water. - Keep dressing dry or change after shower No tub bath. Anesthetic (Use 'Patient Medications' Section for Anesthetic Order Entry): Lidocaine applied to wound bed Edema Control - Lymphedema / Segmental Compressive Device / Other: Elevate leg(s) parallel to the floor when sitting. DO YOUR BEST to sleep in the bed at night. DO NOT sleep in your recliner. Long hours of sitting in a recliner leads to swelling of the legs and/or potential wounds on your backside. Off-Loading: Open toe surgical shoe - Or alternative/keep pressure off of wound Turn and reposition every 2 hours Additional Orders / Instructions: Decrease/Stop Smoking Follow Nutritious Diet and Increase Protein Intake Medications-Please add to medication list.: P.O. Antibiotics - Pick up at pharmacy and start them today and take as prescribed Laboratory ordered were: Wound culture routine - right foot The following medication(s) was prescribed: doxycycline hyclate oral 100 mg capsule 1 1 capsule oral taken 2 times per day for 30 days. Do not take iron within 2  hours of taking this medication before or after starting 03/09/2021 WOUND #1: - Metatarsal head first Wound Laterality: Right, Medial Cleanser: Normal Saline 3 x Per Week/15 Days Discharge Instructions: Wash your hands with soap and water. Remove old dressing, discard into plastic bag and place into trash. Cleanse the wound with Normal Saline prior to applying a clean dressing using gauze sponges, not tissues or cotton balls. Do not scrub or use excessive force. Pat dry using gauze sponges, not tissue or cotton balls. Cleanser: Soap and Water 3 x Per Week/15 Days Jobst, BRENLEIGH COLLET ([id number]) Discharge Instructions: Gently cleanse wound with antibacterial soap, rinse and pat dry prior to dressing wounds Primary Dressing: IODOFLEX 0.9% Cadexomer Iodine Pad 3 x Per Week/15 Days Discharge Instructions: Apply Iodoflex to wound bed only as  directed. Secondary Dressing: Zetuvit Plus Silicone Border Dressing 4x4 (in/in) 3 x Per Week/15 Days 1. Would recommend currently that we go ahead and initiate treatment with doxycycline and the patient is in agreement with that plan. This includes initially a 30-day treatment although I may end up switching this if we need to based on the culture results or even adding something to the regimen again depending on culture results. 2. I am also can recommend at this time that we have the patient continue with the Iodoflex. 3. I am also can recommend that we have her continue with his Zetuvit border foam dressing. 4. I did discuss with her the possibility of going forward with hyperbaric oxygen therapy but again a big part of this will depend on how things proceed with standard wound care and antibiotics over the next several weeks. We will see patient back for reevaluation in 1 week here in the clinic. If anything worsens or changes patient will contact our office for additional recommendations. Electronic Signature(s) Signed: 03/09/2021 4:58:04 PM By: Lenda Kelp PA-C Entered By: Lenda Kelp on 03/09/2021 16:58:04 Artman, [name] ([id number]) -------------------------------------------------------------------------------- SuperBill Details Patient Name: Jody Lowe Date of Service: 03/09/2021 Medical Record Number: [id number] Patient Account Number: [id number] Date of Birth/Sex: 1945/07/07 (75 y.o. 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, stage 3 I10 Essential (primary) hypertension N18.9 Chronic kidney disease, unspecified M62.81 Muscle weakness (generalized) Facility Procedures CPT4 Code: [id number] Description: 11042 - DEB SUBQ TISSUE 20 SQ CM/< Modifier: Quantity: 1 CPT4 Code: Description: ICD-10 Diagnosis Description 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, stage 3 Modifier: Quantity: Physician Procedures CPT4 Code: 8099833 Description: 99214 - WC PHYS LEVEL 4 - EST PT Modifier: 25 Quantity: 1 CPT4 Code: Description: ICD-10 Diagnosis Description I73.89 Other specified peripheral vascular diseases 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

## 2021-03-09 NOTE — Progress Notes (Addendum)
Jody Lowe, Jody Lowe ([id number]) Visit Report for 03/09/2021 Arrival Information Details Patient Name: Jody Lowe, Jody Lowe. Date of Service: 03/09/2021 1:00 PM Medical Record Number: [id number] Patient Account Number: [id number] Date of Birth/Sex: 26-Jan-1946 (75 y.o. F) Treating RN: Hansel Feinstein Primary Care Chrstopher Malenfant: Jeffie Pollock Other Clinician: Referring Corona Popovich: Jeffie Pollock Treating Faatimah Spielberg/Extender: Rowan Blase in Treatment: 1 Visit Information History Since Last Visit Added or deleted any medications: No Patient Arrived: Wheel Chair Had a fall or experienced change in No Arrival Time: 13:03 activities of daily living that may affect Accompanied By: self risk of falls: Transfer Assistance: EasyPivot Patient Lift Hospitalized since last visit: No Patient Identification Verified: Yes Has Dressing in Place as Prescribed: Yes Secondary Verification Process Completed: Yes Pain Present Now: No Patient Requires Transmission-Based No Precautions: Patient Has Alerts: Yes Patient Alerts: Patient on Blood Thinner Eliquis/ASA 81mg  [facility] ABI-N/C Nov '22 TBI-L 0.80; R 0.69 NOT diabetic Electronic Signature(s) Signed: 03/09/2021 1:47:29 PM By: 03/11/2021 Entered By: Hansel Feinstein on 03/09/2021 13:07:09 Allmon, 03/11/2021 ([name]) -------------------------------------------------------------------------------- Encounter Discharge Information Details Patient Name: [id number] Date of Service: 03/09/2021 1:00 PM Medical Record Number: 03/11/2021 Patient Account Number: [id number] Date of Birth/Sex: 22-May-1945 (75 y.o. F) Treating RN: [birth year]1992 Primary Care Ahmod Gillespie: Hansel Feinstein Other Clinician: Referring Sariah Henkin: Jeffie Pollock Treating Taisley Mordan/Extender: Jeffie Pollock in Treatment: 1 Encounter Discharge Information Items Post Procedure Vitals Discharge Condition: Stable Temperature (F): 98.1 Ambulatory Status: Wheelchair Pulse (bpm):  81 Discharge Destination: Home Respiratory Rate (breaths/min): 16 Transportation: Private Auto Blood Pressure (mmHg): 148/76 Accompanied By: self Schedule Follow-up Appointment: No Clinical Summary of Care: Electronic Signature(s) Signed: 03/09/2021 1:47:29 PM By: 03/11/2021 Entered ByHansel Feinstein on 03/09/2021 13:42:22 Steinmiller, 03/11/2021 ([name]) -------------------------------------------------------------------------------- Lower Extremity Assessment Details Patient Name: [id number] Date of Service: 03/09/2021 1:00 PM Medical Record Number: 03/11/2021 Patient Account Number: [id number] Date of Birth/Sex: [birth year] (75 y.o. F) Treating RN: 11-30-1992 Primary Care Charlann Wayne: Hansel Feinstein Other Clinician: Referring Bhumi Godbey: Jeffie Pollock Treating Temitope Flammer/Extender: Jeffie Pollock in Treatment: 1 Edema Assessment Assessed: [Left: No] [Right: Yes] Edema: [Left: N] [Right: o] Calf Left: Right: Point of Measurement: From Medial Instep 43 cm Ankle Left: Right: Point of Measurement: From Medial Instep 24 cm Vascular Assessment Pulses: Dorsalis Pedis Palpable: [Right:Yes] Electronic Signature(s) Signed: 03/09/2021 1:47:29 PM By: 03/11/2021 Entered ByHansel Feinstein on 03/09/2021 13:14:22 Farrington, 03/11/2021 ([name]) -------------------------------------------------------------------------------- Multi Wound Chart Details Patient Name: [id number] Date of Service: 03/09/2021 1:00 PM Medical Record Number: 03/11/2021 Patient Account Number: [id number] Date of Birth/Sex: [birth year] (75 y.o. F) Treating RN: 11-30[birth year]1992 Primary Care Shelaine Frie: Hansel Feinstein Other Clinician: Referring Charne Mcbrien: Jeffie Pollock Treating Etienne Millward/Extender: Jeffie Pollock in Treatment: 1 Vital Signs Height(in): 66 Pulse(bpm): 81 Weight(lbs): 199 Blood Pressure(mmHg): 148/76 Body Mass Index(BMI): 32 Temperature(F): 98.1 Respiratory Rate(breaths/min):  16 Photos: [N/A:N/A] Wound Location: Right, Medial Metatarsal head first N/A N/A Wounding Event: Blister N/A N/A Primary Etiology: Pressure Ulcer N/A N/A Secondary Etiology: Arterial Insufficiency Ulcer N/A N/A Comorbid History: Cataracts, Anemia, Asthma, N/A N/A Coronary Artery Disease, Deep Vein Thrombosis, Hypertension, Gout, Osteoarthritis Date Acquired: 11/23/2020 N/A N/A Weeks of Treatment: 1 N/A N/A Wound Status: Open N/A N/A Measurements L x W x D (cm) 0.5x0.5x0.6 N/A N/A Area (cm) : 0.196 N/A N/A Volume (cm) : 0.118 N/A N/A % Reduction in Area: -55.60% N/A N/A % Reduction in Volume: -87.30% N/A N/A Starting Position 1 (o'clock): 12 Ending Position 1 (o'clock): 12 Maximum Distance 1 (cm): 0.5 Undermining: Yes  N/A N/A Classification: Category/Stage III N/A N/A Exudate Amount: Medium N/A N/A Exudate Type: Serosanguineous N/A N/A Exudate Color: red, brown N/A N/A Wound Margin: Thickened N/A N/A Granulation Amount: None Present (0%) N/A N/A Necrotic Amount: Large (67-100%) N/A N/A Exposed Structures: Fat Layer (Subcutaneous Tissue): N/A N/A Yes Fascia: No Tendon: No Muscle: No Joint: No Bone: No Treatment Notes Electronic Signature(s) Signed: 03/09/2021 1:47:29 PM By: Early Chars ([id number]) Entered By: Hansel Feinstein on 03/09/2021 13:28:52 Ungaro, [name] ([id number]) -------------------------------------------------------------------------------- Multi-Disciplinary Care Plan Details Patient Name: Jody Lowe Date of Service: 03/09/2021 1:00 PM Medical Record Number: [id number] Patient Account Number: [id number] Date of Birth/Sex: [birth year] (75 y.o. F) Treating RN: Hansel Feinstein Primary Care Tunya Held: Jeffie Pollock Other Clinician: Referring Omeed Osuna: Jeffie Pollock Treating Tyjai Matuszak/Extender: Rowan Blase in Treatment: 1 Active Inactive Wound/Skin Impairment Nursing Diagnoses: Impaired tissue integrity Knowledge deficit  related to smoking impact on wound healing Knowledge deficit related to ulceration/compromised skin integrity Goals: Patient will demonstrate a reduced rate of smoking or cessation of smoking Date Initiated: 02/27/2021 Target Resolution Date: 04/25/2021 Goal Status: Active Ulcer/skin breakdown will have a volume reduction of 30% by week 4 Date Initiated: 02/27/2021 Target Resolution Date: 03/27/2021 Goal Status: Active Ulcer/skin breakdown will have a volume reduction of 50% by week 8 Date Initiated: 02/27/2021 Target Resolution Date: 04/24/2021 Goal Status: Active Ulcer/skin breakdown will have a volume reduction of 80% by week 12 Date Initiated: 02/27/2021 Target Resolution Date: 05/22/2021 Goal Status: Active Ulcer/skin breakdown will heal within 14 weeks Date Initiated: 02/27/2021 Target Resolution Date: 06/12/2021 Goal Status: Active Interventions: Assess patient/caregiver ability to obtain necessary supplies Assess patient/caregiver ability to perform ulcer/skin care regimen upon admission and as needed Assess ulceration(s) every visit Provide education on smoking Notes: Electronic Signature(s) Signed: 03/09/2021 1:47:29 PM By: Hansel Feinstein Entered ByHansel Feinstein on 03/09/2021 13:25:57 Adel, [name] ([id number]) -------------------------------------------------------------------------------- Pain Assessment Details Patient Name: Jody Lowe Date of Service: 03/09/2021 1:00 PM Medical Record Number: [id number] Patient Account Number: [id number] Date of Birth/Sex: Jul 08, 1945 (75 y.o. F) Treating RN: Hansel Feinstein Primary Care Trevious Rampey: Jeffie Pollock Other Clinician: Referring Madolin Twaddle: Jeffie Pollock Treating Embry Huss/Extender: Rowan Blase in Treatment: 1 Active Problems Location of Pain Severity and Description of Pain Patient Has Paino No Site Locations Rate the pain. Current Pain Level: 0 Pain Management and Medication Current Pain  Management: Electronic Signature(s) Signed: 03/09/2021 1:47:29 PM By: Hansel Feinstein Entered By: Hansel Feinstein on 03/09/2021 13:07:40 Nedd, [name] ([id number]) -------------------------------------------------------------------------------- Patient/Caregiver Education Details Patient Name: Jody Lowe Date of Service: 03/09/2021 1:00 PM Medical Record Number: [id number] Patient Account Number: [id number] Date of Birth/Gender: March 27, 1945 (75 y.o. F) Treating RN: Hansel Feinstein Primary Care Physician: Jeffie Pollock Other Clinician: Referring Physician: Jeffie Pollock Treating Physician/Extender: Rowan Blase in Treatment: 1 Education Assessment Education Provided To: Patient Education Topics Provided Basic Hygiene: Infection: Smoking and Wound Healing: Wound Debridement: Wound/Skin Impairment: Electronic Signature(s) Signed: 03/09/2021 1:47:29 PM By: Hansel Feinstein Entered By: Hansel Feinstein on 03/09/2021 13:36:25 Petitfrere, [name] ([id number]) -------------------------------------------------------------------------------- Wound Assessment Details Patient Name: Jody Lowe Date of Service: 03/09/2021 1:00 PM Medical Record Number: [id number] Patient Account Number: [id number] Date of Birth/Sex: [birth year] (75 y.o. F) Treating RN: Hansel Feinstein Primary Care Johnelle Tafolla: Jeffie Pollock Other Clinician: Referring Adelyna Brockman: Jeffie Pollock Treating Mattia Liford/Extender: Rowan Blase in Treatment: 1 Wound Status Wound Number: 1 Primary Pressure Ulcer Etiology: Wound Location: Right, Medial Metatarsal head first Secondary Arterial Insufficiency Ulcer Wounding Event: Blister Etiology: Date Acquired: 11/23/2020 Wound  Open Weeks Of Treatment: 1 Status: Clustered Wound: No Comorbid Cataracts, Anemia, Asthma, Coronary Artery Disease, History: Deep Vein Thrombosis, Hypertension, Gout, Osteoarthritis Photos Wound Measurements Length: (cm) 0.5 % R Width: (cm)  0.5 % R Depth: (cm) 0.6 Tun Area: (cm) 0.196 Un Volume: (cm) 0.118 E M eduction in Area: -55.6% eduction in Volume: -87.3% neling: No dermining: Yes Starting Position (o'clock): 12 nding Position (o'clock): 12 aximum Distance: (cm) 0.5 Wound Description Classification: Category/Stage III Fo Wound Margin: Thickened Sl Exudate Amount: Medium Exudate Type: Serosanguineous Exudate Color: red, brown ul Odor After Cleansing: No ough/Fibrino Yes Wound Bed Granulation Amount: None Present (0%) Exposed Structure Necrotic Amount: Large (67-100%) Fascia Exposed: No Necrotic Quality: Adherent Slough Fat Layer (Subcutaneous Tissue) Exposed: Yes Tendon Exposed: No Muscle Exposed: No Joint Exposed: No Bone Exposed: No Treatment Notes Wound #1 (Metatarsal head first) Wound Laterality: Right, Medial Borgwardt, [name] ([id number]) Cleanser Normal Saline Discharge Instruction: Wash your hands with soap and water. Remove old dressing, discard into plastic bag and place into trash. Cleanse the wound with Normal Saline prior to applying a clean dressing using gauze sponges, not tissues or cotton balls. Do not scrub or use excessive force. Pat dry using gauze sponges, not tissue or cotton balls. Soap and Water Discharge Instruction: Gently cleanse wound with antibacterial soap, rinse and pat dry prior to dressing wounds Peri-Wound Care Topical Primary Dressing IODOFLEX 0.9% Cadexomer Iodine Pad Discharge Instruction: Apply Iodoflex to wound bed only as directed. Secondary Dressing Zetuvit Plus Silicone Border Dressing 4x4 (in/in) Secured With Compression Wrap Compression Stockings Add-Ons Electronic Signature(s) Signed: 03/09/2021 1:47:29 PM By: Hansel Feinstein Entered By: Hansel Feinstein on 03/09/2021 13:12:46 Koors, [name] ([id number]) -------------------------------------------------------------------------------- Vitals Details Patient Name: Jody Lowe Date of Service:  03/09/2021 1:00 PM Medical Record Number: [id number] Patient Account Number: [id number] Date of Birth/Sex: 1945-11-13 (75 y.o. F) Treating RN: Hansel Feinstein Primary Care Arriana Lohmann: Jeffie Pollock Other Clinician: Referring Breda Bond: Jeffie Pollock Treating Giles Currie/Extender: Rowan Blase in Treatment: 1 Vital Signs Time Taken: 13:05 Temperature (F): 98.1 Height (in): 66 Pulse (bpm): 81 Weight (lbs): 199 Respiratory Rate (breaths/min): 16 Body Mass Index (BMI): 32.1 Blood Pressure (mmHg): 148/76 Reference Range: 80 - 120 mg / dl Electronic Signature(s) Signed: 03/09/2021 1:47:29 PM By: Hansel Feinstein Entered ByHansel Feinstein on 03/09/2021 13:07:27

## 2021-03-13 LAB — AEROBIC CULTURE W GRAM STAIN (SUPERFICIAL SPECIMEN): Gram Stain: NONE SEEN

## 2021-03-16 ENCOUNTER — Encounter: Payer: Medicare Other | Admitting: Physician Assistant

## 2021-03-16 ENCOUNTER — Other Ambulatory Visit: Payer: Self-pay

## 2021-03-16 DIAGNOSIS — L89893 Pressure ulcer of other site, stage 3: Secondary | ICD-10-CM | POA: Diagnosis not present

## 2021-03-16 NOTE — Progress Notes (Addendum)
Jody, Lowe ([id number]) Visit Report for 03/16/2021 Chief Complaint Document Details Patient Name: Jody, Lowe. Date of Service: 03/16/2021 11:15 AM Medical Record Number: [id number] Patient Account Number: [id number] Date of Birth/Sex: 1945-10-15 (75 y.o. F) Treating RN: Jody Lowe Primary Care Provider: Jeffie Lowe Other Clinician: Referring Provider: Jeffie Lowe Treating Provider/Extender: Jody Lowe in Treatment: 2 Information Obtained from: Patient Chief Complaint Right Foot Ulcer Electronic Signature(s) Signed: 03/16/2021 11:25:23 AM By: Jody Kelp PA-C Entered By: Jody Lowe on 03/16/2021 11:25:22 Jody Lowe ([id number]) -------------------------------------------------------------------------------- Debridement Details Patient Name: Jody Lowe Date of Service: 03/16/2021 11:15 AM Medical Record Number: [id number] Patient Account Number: [id number] Date of Birth/Sex: Jul 28, 1945 (75 y.o. F) Treating RN: Jody Lowe Primary Care Provider: Jeffie Lowe Other Clinician: Referring Provider: Jeffie Lowe Treating Provider/Extender: Jody Lowe in Treatment: 2 Debridement Performed for Wound #1 Right,Medial Metatarsal head first Assessment: Performed By: Physician Jody Lowe., PA-C Debridement Type: Debridement Severity of Tissue Pre Debridement: Fat layer exposed Level of Consciousness (Pre- Awake and Alert procedure): Pre-procedure Verification/Time Out Yes - 11:56 Taken: Start Time: 11:57 Pain Control: Lidocaine Total Area Debrided (L x W): 0.7 (cm) x 0.6 (cm) = 0.42 (cm) Tissue and other material Viable, Non-Viable, Slough, Subcutaneous, Slough debrided: Level: Skin/Subcutaneous Tissue Debridement Description: Excisional Instrument: Curette Bleeding: Minimum Hemostasis Achieved: Pressure Response to Treatment: Procedure was tolerated well Level of Consciousness (Post- Awake and  Alert procedure): Post Debridement Measurements of Total Wound Length: (cm) 0.7 Stage: Category/Stage III Width: (cm) 0.6 Depth: (cm) 0.5 Volume: (cm) 0.165 Character of Wound/Ulcer Post Debridement: Improved Severity of Tissue Post Debridement: Fat layer exposed Post Procedure Diagnosis Same as Pre-procedure Electronic Signature(s) Signed: 03/16/2021 12:08:57 PM By: Jody Lowe Signed: 03/16/2021 12:10:22 PM By: Jody Kelp PA-C Entered By: Jody Lowe on 03/16/2021 11:57:38 Jody Lowe ([id number]) -------------------------------------------------------------------------------- HPI Details Patient Name: Jody Lowe Date of Service: 03/16/2021 11:15 AM Medical Record Number: [id number] Patient Account Number: [id number] Date of Birth/Sex: Aug 13, 1945 (75 y.o. F) Treating RN: Jody Lowe Primary Care Provider: Jeffie Lowe Other Clinician: Referring Provider: Jeffie Lowe Treating Provider/Extender: Jody Lowe in Treatment: 2 History of Present Illness HPI Description: 02/27/2021 upon evaluation today patient presents for inspection in our office today concerning issues that she has been having over the right medial first metatarsal head. This is actually a pressure ulcer that I think developed as result of the patient actually passing out in her driveway and this incident occurred around the beginning of September 2022. She ended up October 10 having bypass surgery in regard to her leg to restore blood flow to the extremity and subsequently I think that this probably contributed to the wound development as with limited blood flow she would not have needed a whole lot of pressure getting to the area to cause a significant issue and passing out in the driveway could have definitely done that. She has been seeing Jody Lowe and right now they have been using Betadine. Overall I think that we probably need to try something a little bit different to see if  we can get this moving in a better direction. With that being said the patient does have a few other medical problems that would be detailed below. Of again she did have peripheral vascular disease and subsequently she has had a bypass surgery on the extremity to restore blood flow and her most recent arterial study shows that she does have noncompressible ABIs bilaterally with a right TBI of  0.69 and a left TBI of 0.80 again this seems to be doing decently well. I am pleased in that regard. She does have a history of hypertension, chronic kidney disease though I do not know what stage, and generalized muscle weakness. 03/09/2021 upon evaluation today patient appears to be doing about the same in regard to her foot ulcer. Notices the redness has not spread and things seem to be making progress here. Unfortunately she does still have some necrotic tissue in the center part of the wound she does have good arterial flow as noted by her arterial studies that we got back. Fortunately I think that we are good to proceed with some debridement here. I also think that she may be a candidate for hyperbaric oxygen therapy depending on how things go. 03/16/2021 upon evaluation today patient appears to be doing well with regard to her foot ulcer. She has been tolerating the dressing changes without complication. Fortunately there is no signs of active infection at this time. No fevers, chills, nausea, vomiting, or diarrhea. Electronic Signature(s) Signed: 03/16/2021 12:00:41 PM By: Jody Kelp PA-C Entered By: Jody Lowe on 03/16/2021 12:00:41 Jody Lowe ([id number]) -------------------------------------------------------------------------------- Physical Exam Details Patient Name: Jody Lowe Date of Service: 03/16/2021 11:15 AM Medical Record Number: [id number] Patient Account Number: [id number] Date of Birth/Sex: Jul 24, 1945 (75 y.o. F) Treating RN: Jody Lowe Primary Care Provider:  Jeffie Lowe Other Clinician: Referring Provider: Jeffie Lowe Treating Provider/Extender: Jody Lowe in Treatment: 2 Constitutional Well-nourished and well-hydrated in no acute distress. Respiratory normal breathing without difficulty. Psychiatric this patient is able to make decisions and demonstrates good insight into disease process. Alert and Oriented x 3. pleasant and cooperative. Notes Patient's wound bed showed signs of good granulation and epithelization at this point. I do not see any evidence of infection at this time which is great and overall she did have some slough I did perform sharp debridement to clear this away and I think this is significantly improved compared to last time I saw her. I am very happy with what we see. Electronic Signature(s) Signed: 03/16/2021 12:01:01 PM By: Jody Kelp PA-C Entered By: Jody Lowe on 03/16/2021 12:01:01 Clapper, Daine Lowe ([id number]) -------------------------------------------------------------------------------- Physician Orders Details Patient Name: Jody Lowe Date of Service: 03/16/2021 11:15 AM Medical Record Number: [id number] Patient Account Number: [id number] Date of Birth/Sex: 1945/09/01 (75 y.o. 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 o Nurse Visit as needed Home Health o Home Health Company: - [place] o [facility] for wound care. May utilize formulary equivalent dressing for  wound treatment orders unless otherwise specified. Home Health Nurse may visit PRN to address patientos wound care needs. o Scheduled days for dressing changes to be completed; exception, patient has scheduled wound care visit that day. o **Please direct any NON-WOUND related issues/requests for orders to patient's Primary Care Physician. **If current dressing causes regression in wound condition, may D/C ordered dressing product/s and apply Normal Saline Moist Dressing daily until next Wound Healing Center or Other MD appointment. **Notify Wound Healing Center of regression in wound condition at [phone]. Bathing/ Sport and exercise psychologist  o Wash wounds with antibacterial soap and water. - Keep dressing dry or change after shower o No tub bath. Anesthetic (Use 'Patient Medications' Section for Anesthetic Order Entry) o Lidocaine applied to wound bed Edema Control - Lymphedema / Segmental Compressive Device / Other o Elevate leg(s) parallel to the floor when sitting. o DO YOUR BEST to sleep in the bed at night. DO NOT sleep in your recliner. Long hours of sitting in a recliner leads to swelling of the legs and/or potential wounds on your backside. Off-Loading o Open toe surgical shoe - Or alternative/keep pressure off of wound o Turn and reposition every 2 hours Additional Orders / Instructions o Decrease/Stop Smoking o Follow Nutritious Diet and Increase Protein Intake Medications-Please add to medication list. o P.O. Antibiotics - take as prescribed Wound Treatment Wound #1 - Metatarsal head first Wound Laterality: Right, Medial Cleanser: Normal Saline 3 x Per Week/15 Days Discharge Instructions: Wash your hands with soap and water. Remove old dressing, discard into plastic bag and place into trash. Cleanse the wound with Normal Saline prior to applying a clean dressing using gauze sponges, not tissues or cotton balls. Do not scrub or use excessive force. Pat dry  using gauze sponges, not tissue or cotton balls. Cleanser: Soap and Water 3 x Per Week/15 Days Discharge Instructions: Gently cleanse wound with antibacterial soap, rinse and pat dry prior to dressing wounds Starry, Tenley M. ([id number]) Primary Dressing: IODOFLEX 0.9% Cadexomer Iodine Pad 3 x Per Week/15 Days Discharge Instructions: Apply Iodoflex to wound bed only as directed. Secondary Dressing: Zetuvit Plus Silicone Border Dressing 4x4 (in/in) 3 x Per Week/15 Days Electronic Signature(s) Signed: 03/16/2021 12:08:57 PM By: Jody Lowe Signed: 03/16/2021 12:10:22 PM By: Jody Kelp PA-C Entered By: Jody Lowe on 03/16/2021 11:59:22 Dombrosky, Daine Lowe ([id number]) -------------------------------------------------------------------------------- Problem List Details Patient Name: Jody Lowe Date of Service: 03/16/2021 11:15 AM Medical Record Number: [id number] Patient Account Number: [id number] Date of Birth/Sex: Feb 13, 1946 (75 y.o. 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, stage 3 02/27/2021 No Yes I10 Essential (primary) hypertension 02/27/2021 No Yes N18.9 Chronic kidney disease, unspecified 02/27/2021 No Yes M62.81 Muscle weakness (generalized) 02/27/2021 No Yes Inactive Problems Resolved Problems Electronic Signature(s) Signed: 03/16/2021 11:25:18 AM By: Jody Kelp PA-C Entered By: Jody Lowe on 03/16/2021 11:25:18 Stahl, Daine Lowe ([id number]) -------------------------------------------------------------------------------- Progress Note Details Patient Name: Jody Lowe Date of Service: 03/16/2021 11:15 AM Medical Record Number:  [id number] Patient Account Number: [id number] Date of Birth/Sex: 06-23-1945 (75 y.o. F) Treating RN: Jody Lowe Primary Care Provider: Jeffie Lowe Other Clinician: Referring Provider: Jeffie Lowe Treating Provider/Extender: Jody Lowe in Treatment: 2 Subjective Chief Complaint Information obtained from Patient Right Foot Ulcer History of Present Illness (HPI) 02/27/2021 upon evaluation today patient presents for inspection in our office today concerning issues that she has been having over the right medial first metatarsal head. This is actually a pressure ulcer that I think developed as result of the patient actually passing out in her driveway and this incident occurred around the beginning of September 2022. She ended up October 10 having bypass surgery in regard to her leg to restore blood flow to the extremity and subsequently I think that this probably contributed to the wound development as  with limited blood flow she would not have needed a whole lot of pressure getting to the area to cause a significant issue and passing out in the driveway could have definitely done that. She has been seeing Jody Lowe and right now they have been using Betadine. Overall I think that we probably need to try something a little bit different to see if we can get this moving in a better direction. With that being said the patient does have a few other medical problems that would be detailed below. 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 I am pleased in that regard. She does have a history of hypertension, chronic kidney disease though I do not know what stage, and generalized muscle weakness. 03/09/2021 upon  evaluation today patient appears to be doing about the same in regard to her foot ulcer. Notices the redness has not spread and things seem to be making progress here. Unfortunately she does still have some necrotic tissue in the center part of the wound she does have good arterial flow as noted by her arterial studies that we got back. Fortunately I think that we are good to proceed with some debridement here. I also think that she may be a candidate for hyperbaric oxygen therapy depending on how things go. 03/16/2021 upon evaluation today patient appears to be doing well with regard to her foot ulcer. She has been tolerating the dressing changes without complication. Fortunately there is no signs of active infection at this time. No fevers, chills, nausea, vomiting, or diarrhea. Objective Constitutional Well-nourished and well-hydrated in no acute distress. Vitals Time Taken: 11:42 AM, Height: 66 in, Weight: 199 lbs, BMI: 32.1, Temperature: 98.3 F, Pulse: 76 bpm, Respiratory Rate: 16 breaths/min, Blood Pressure: 95/62 mmHg. Respiratory normal breathing without difficulty. Psychiatric this patient is able to make decisions and demonstrates good insight into disease process. Alert and Oriented x 3. pleasant and cooperative. General Notes: Patient's wound bed showed signs of good granulation and epithelization at this point. I do not see any evidence of infection at this time which is great and overall she did have some slough I did perform sharp debridement to clear this away and I think this is significantly improved compared to last time I saw her. I am very happy with what we see. Integumentary (Hair, Skin) Wound #1 status is Open. Original cause of wound was Blister. The date acquired was: 11/23/2020. The wound has been in treatment 2 weeks. The wound is located on the Right,Medial Metatarsal head first. The wound measures 0.7cm length x 0.6cm width x 0.5cm depth; 0.33cm^2 area and 0.165cm^3  volume. There is a medium amount of serosanguineous drainage noted. Krienke, Jody Lowe ([id number]) Assessment Active Problems ICD-10 Other specified peripheral vascular diseases Pressure ulcer of other site, stage 3 Essential (primary) hypertension Chronic kidney disease, unspecified Muscle weakness (generalized) Procedures Wound #1 Pre-procedure diagnosis of Wound #1 is a Pressure Ulcer located on the Right,Medial Metatarsal head first .Severity of Tissue Pre Debridement is: Fat layer exposed. There was a Excisional Skin/Subcutaneous Tissue Debridement with a total area of 0.42 sq cm performed by Jody Lowe., PA-C. With the following instrument(s): Curette to remove Viable and Non-Viable tissue/material. Material removed  includes Subcutaneous Tissue and Slough and after achieving pain control using Lidocaine. A time out was conducted at 11:56, prior to the start of the procedure. A Minimum amount of bleeding was controlled with Pressure. The procedure was tolerated well. Post Debridement Measurements: 0.7cm length x 0.6cm width x 0.5cm depth; 0.165cm^3 volume. Post debridement Stage noted as Category/Stage III. Character of Wound/Ulcer Post Debridement is improved. Severity of Tissue Post Debridement is: Fat layer exposed. Post procedure Diagnosis Wound #1: Same as Pre-Procedure Plan Follow-up Appointments: Return Appointment in 2 weeks. Nurse Visit as needed Home Health: Home Health Company: - [phone] [facility] for wound care. May utilize formulary equivalent dressing for wound treatment orders unless otherwise specified. Home Health Nurse may visit PRN to address patient s wound care needs. Scheduled days for dressing changes to be completed; exception, patient has scheduled wound care visit that day. **Please direct any NON-WOUND related issues/requests for orders to patient's Primary Care Physician. **If current dressing causes regression in wound condition, may D/C  ordered dressing product/s and apply Normal Saline Moist Dressing daily until next Wound Healing Center or Other MD appointment. **Notify Wound Healing Center of regression in wound condition at [phone]. Bathing/ Shower/ Hygiene: Wash wounds with antibacterial soap and water. - Keep dressing dry or change after shower No tub bath. Anesthetic (Use 'Patient Medications' Section for Anesthetic Order Entry): Lidocaine applied to wound bed Edema Control - Lymphedema / Segmental Compressive Device / Other: Elevate leg(s) parallel to the floor when sitting. DO YOUR BEST to sleep in the bed at night. DO NOT sleep in your recliner. Long hours of sitting in a recliner leads to swelling of the legs and/or potential wounds on your backside. Off-Loading: Open toe surgical shoe - Or alternative/keep pressure off of wound Turn and reposition every 2 hours Additional Orders / Instructions: Decrease/Stop Smoking Follow Nutritious Diet and Increase Protein Intake Medications-Please add to medication list.: P.O. Antibiotics - take as prescribed WOUND #1: - Metatarsal head first Wound Laterality: Right, Medial Cleanser: Normal Saline 3 x Per Week/15 Days Discharge Instructions: Wash your hands with soap and water. Remove old dressing, discard into plastic bag and place into trash. Cleanse the wound with Normal Saline prior to applying a clean dressing using gauze sponges, not tissues or cotton balls. Do not scrub or use excessive force. Pat dry using gauze sponges, not tissue or cotton balls. Cleanser: Soap and Water 3 x Per Week/15 Days Discharge Instructions: Gently cleanse wound with antibacterial soap, rinse and pat dry prior to dressing wounds Primary Dressing: IODOFLEX 0.9% Cadexomer Iodine Pad 3 x Per Week/15 Days Discharge Instructions: Apply Iodoflex to wound bed only as directed. Secondary Dressing: Zetuvit Plus Silicone Border Dressing 4x4 (in/in) 3 x Per Week/15 Days Witherow, Zoi M.  ([id number]) 1. I am going to recommend that we go ahead and continue with the Iodosorb for the next couple weeks. 2. I am okay with home health coming out 2 times a week I think that is appropriate at this point as well I see no issues in that regard they did call about that yesterday. 3. I am also going to suggest that we continue with the border foam dressing to cover. Obviously I think that needs to be the main dressing here not roll gauze and ABD pads which is too bulky for her. We will see patient back for reevaluation in 1 week here in the clinic. If anything worsens or changes patient will contact our office for additional recommendations. Electronic  Signature(s) Signed: 03/16/2021 12:01:40 PM By: Jody Kelp PA-C Entered By: Jody Lowe on 03/16/2021 12:01:39 Quiggle, Daine Lowe ([id number]) -------------------------------------------------------------------------------- SuperBill Details Patient Name: Jody Lowe Date of Service: 03/16/2021 Medical Record Number: [id number] Patient Account Number: [id number] Date of Birth/Sex: 05-11-1945 (75 y.o. 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

## 2021-03-16 NOTE — Progress Notes (Addendum)
KENDALL, ARNELL ([id number]) Visit Report for 03/16/2021 Arrival Information Details Patient Name: Jody Lowe, Jody Lowe. Date of Service: 03/16/2021 11:15 AM Medical Record Number: [id number] Patient Account Number: [id number] Date of Birth/Sex: Nov 01, 1945 (75 y.o. F) Treating RN: Huel Coventry Primary Care Horice Carrero: Jeffie Pollock Other Clinician: Referring Wasif Simonich: Jeffie Pollock Treating Jadasia Haws/Extender: Rowan Blase in Treatment: 2 Visit Information History Since Last Visit Added or deleted any medications: No Patient Arrived: Wheel Chair Has Dressing in Place as Prescribed: Yes Arrival Time: 11:36 Pain Present Now: No Accompanied By: son in lobby Transfer Assistance: None Patient Identification Verified: Yes Secondary Verification Process Completed: Yes Patient Requires Transmission-Based No Precautions: Patient Has Alerts: Yes Patient Alerts: Patient on Blood Thinner Eliquis/ASA 81mg  [facility] ABI-N/C Nov '22 TBI-L 0.80; R 0.69 NOT diabetic Notes Son states he does not go any further than the lobby door. Electronic Signature(s) Signed: 03/16/2021 12:21:29 PM By: 03/18/2021, BSN, RN, CWS, Kim RN, BSN Entered By: Elliot Gurney, BSN, RN, CWS, Kim on 03/16/2021 11:39:29 Kloosterman, 03/18/2021 (Jody Lowe) -------------------------------------------------------------------------------- Encounter Discharge Information Details Patient Name: [id number] Date of Service: 03/16/2021 11:15 AM Medical Record Number: 03/18/2021 Patient Account Number: [id number] Date of Birth/Sex: 1945/09/17 (75 y.o. F) Treating RN: 11-30-1992 Primary Care Emilea Goga: Hansel Feinstein Other Clinician: Referring Angeles Zehner: Jeffie Pollock Treating Treniyah Lynn/Extender: Jeffie Pollock in Treatment: 2 Encounter Discharge Information Items Post Procedure Vitals Discharge Condition: Stable Temperature (F): 98.3 Ambulatory Status: Wheelchair Pulse (bpm): 76 Discharge Destination: Home Respiratory  Rate (breaths/min): 16 Transportation: Private Auto Blood Pressure (mmHg): 95/62 Accompanied By: self Schedule Follow-up Appointment: Yes Clinical Summary of Care: Electronic Signature(s) Signed: 03/16/2021 12:08:57 PM By: 03/18/2021 Entered By: Hansel Feinstein on 03/16/2021 12:06:22 Bonneau, 03/18/2021 (Jody Lowe) -------------------------------------------------------------------------------- Lower Extremity Assessment Details Patient Name: [id number] Date of Service: 03/16/2021 11:15 AM Medical Record Number: 03/18/2021 Patient Account Number: [id number] Date of Birth/Sex: [birth year] (75 y.o. F) Treating RN: 11-30-1992 Primary Care Ryden Wainer: Hansel Feinstein Other Clinician: Referring Somaly Marteney: Jeffie Pollock Treating Jermell Holeman/Extender: Jeffie Pollock in Treatment: 2 Edema Assessment Assessed: [Left: No] [Right: Yes] Edema: [Left: N] [Right: o] Calf Left: Right: Point of Measurement: From Medial Instep 43 cm Ankle Left: Right: Point of Measurement: From Medial Instep 24 cm Vascular Assessment Pulses: Dorsalis Pedis Palpable: [Right:Yes] Electronic Signature(s) Signed: 03/16/2021 12:08:57 PM By: 03/18/2021 Entered ByHansel Feinstein on 03/16/2021 11:56:14 Cotroneo, 03/18/2021 (Jody Lowe) -------------------------------------------------------------------------------- Multi Wound Chart Details Patient Name: [id number] Date of Service: 03/16/2021 11:15 AM Medical Record Number: 03/18/2021 Patient Account Number: [id number] Date of Birth/Sex: 05/06/1945 (75 y.o. F) Treating RN: 11-30-1992 Primary Care Hadlee Burback: Hansel Feinstein Other Clinician: Referring Quill Grinder: Jeffie Pollock Treating Pinchos Topel/Extender: Jeffie Pollock in Treatment: 2 Vital Signs Height(in): 66 Pulse(bpm): 76 Weight(lbs): 199 Blood Pressure(mmHg): 95/62 Body Mass Index(BMI): 32 Temperature(F): 98.3 Respiratory Rate(breaths/min): 16 Photos: [1:No Photos]  [N/A:N/A] Wound Location: [1:Right, Medial Metatarsal head first] [N/A:N/A] Wounding Event: [1:Blister] [N/A:N/A] Primary Etiology: [1:Pressure Ulcer] [N/A:N/A] Secondary Etiology: [1:Arterial Insufficiency Ulcer] [N/A:N/A] Date Acquired: [1:11/23/2020] [N/A:N/A] Weeks of Treatment: [1:2] [N/A:N/A] Wound Status: [1:Open] [N/A:N/A] Measurements L x W x D (cm) [1:0.7x0.6x0.5] [N/A:N/A] Area (cm) : [1:0.33] [N/A:N/A] Volume (cm) : [1:0.165] [N/A:N/A] % Reduction in Area: [1:-161.90%] [N/A:N/A] % Reduction in Volume: [1:-161.90%] [N/A:N/A] 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 F) Treating RN: Hansel Feinstein Primary Care Mykaela Arena: Jeffie Pollock Other Clinician: Referring Haevyn Ury: Jeffie Pollock Treating Yohann Curl/Extender: Rowan Blase in Treatment: 2 Active Inactive Wound/Skin Impairment Nursing Diagnoses: Impaired tissue integrity Knowledge deficit related to smoking impact on wound healing Knowledge deficit related to ulceration/compromised skin integrity Goals: Patient will demonstrate a reduced rate of smoking or cessation of smoking Date Initiated: 02/27/2021 Target Resolution Date: 04/25/2021 Goal Status: Active Ulcer/skin breakdown will have a volume reduction of 30% by week 4 Date Initiated: 02/27/2021 Target Resolution Date: 03/27/2021 Goal Status: Active Ulcer/skin breakdown will have a volume reduction  of 50% by week 8 Date Initiated: 02/27/2021 Target Resolution Date: 04/24/2021 Goal Status: Active Ulcer/skin breakdown will have a volume reduction of 80% by week 12 Date Initiated: 02/27/2021 Target Resolution Date: 05/22/2021 Goal Status: Active Ulcer/skin breakdown will heal within 14 weeks Date Initiated: 02/27/2021 Target Resolution Date: 06/12/2021 Goal Status: Active Interventions: Assess patient/caregiver ability to obtain necessary supplies Assess patient/caregiver ability to perform ulcer/skin care regimen upon admission and as needed Assess ulceration(s) every visit Provide education on smoking Notes: Electronic Signature(s) Signed: 03/16/2021 12:08:57 PM By: Hansel Feinstein Entered ByHansel Feinstein on 03/16/2021 11:56:22 Brooking, Jody Lowe ([id number]) -------------------------------------------------------------------------------- Pain Assessment Details Patient Name: Jody Lowe Date of Service: 03/16/2021 11:15 AM Medical Record Number: [id number] Patient Account Number: [id number] Date of Birth/Sex: 08-19-1945 (75 y.o. F) Treating RN: Huel Coventry Primary Care Delmer Kowalski: Jeffie Pollock Other Clinician: Referring Mikelle Myrick: Jeffie Pollock Treating Juvenal Umar/Extender: Rowan Blase in Treatment: 2 Active Problems Location of Pain Severity and Description of Pain Patient Has Paino No Site Locations Pain Management and Medication Current Pain Management: Electronic Signature(s) Signed: 03/16/2021 12:21:29 PM By: Elliot Gurney, BSN, RN, CWS, Kim RN, BSN Entered By: Elliot Gurney, BSN, RN, CWS, Kim on 03/16/2021 11:43:55 Warehime, Jody Lowe ([id number]) -------------------------------------------------------------------------------- Patient/Caregiver Education Details Patient Name: Jody Lowe Date of Service: 03/16/2021 11:15 AM Medical Record Number: [id number] Patient Account Number: [id number] Date of Birth/Gender: January 24, 1946 (75 y.o. F) Treating RN: Hansel Feinstein Primary Care Physician: Jeffie Pollock Other Clinician: Referring Physician: Jeffie Pollock Treating Physician/Extender: Rowan Blase in Treatment: 2 Education Assessment Education Provided To: Patient Education Topics Provided Basic Hygiene: Smoking and Wound Healing: Wound Debridement: Wound/Skin Impairment: Electronic Signature(s) Signed: 03/16/2021 12:08:57 PM By: Hansel Feinstein Entered By: Hansel Feinstein on 03/16/2021 12:00:08 Gobin, Jody Lowe ([id number]) -------------------------------------------------------------------------------- Wound Assessment Details Patient Name: Jody Lowe Date of Service: 03/16/2021 11:15 AM Medical Record Number: [id number] Patient Account Number: [id number] Date of Birth/Sex: June 17, 1945 (75 y.o. F) Treating RN: Huel Coventry Primary Care Abdirizak Richison: Jeffie Pollock Other Clinician: Referring Aleisa Howk: Jeffie Pollock Treating Makahla Kiser/Extender: Rowan Blase in Treatment: 2 Wound Status Wound Number: 1 Primary Etiology: Pressure Ulcer Wound Location: Right, Medial Metatarsal head first Secondary Etiology: Arterial Insufficiency Ulcer Wounding Event: Blister Wound Status: Open Date Acquired: 11/23/2020 Weeks Of Treatment: 2 Clustered Wound: No Photos Photo Uploaded By: Elliot Gurney, BSN, RN, CWS, Kim on 03/16/2021 12:11:58 Wound Measurements Length: (cm) 0.7 Width: (cm) 0.6 Depth: (cm) 0.5 Area: (cm) 0.33 Volume: (cm) 0.165 % Reduction in Area: -161.9% % Reduction in Volume: -161.9% Wound Description Classification: Category/Stage III Exudate Amount: Medium Exudate Type: Serosanguineous Exudate Color: red, brown Treatment Notes Wound #1 (Metatarsal head first) Wound Laterality: Right, Medial Cleanser Normal Saline Discharge Instruction: Wash your hands with soap and water. Remove old dressing, discard into plastic bag and  place into trash. Cleanse the wound with Normal Saline prior to applying a clean  dressing using gauze sponges, not tissues or cotton balls. Do not scrub or use excessive force. Pat dry using gauze sponges, not tissue or cotton balls. Soap and Water Discharge Instruction: Gently cleanse wound with antibacterial soap, rinse and pat dry prior to dressing wounds Peri-Wound Care Topical Primary Dressing IODOFLEX 0.9% Cadexomer Iodine Pad Bressman, Jody Lowe ([id number]) Discharge Instruction: Apply Iodoflex to wound bed only as directed. Secondary Dressing Zetuvit Plus Silicone Border Dressing 4x4 (in/in) Secured With Compression Wrap Compression Stockings Facilities manager) Signed: 03/16/2021 12:21:29 PM By: Elliot Gurney, BSN, RN, CWS, Kim RN, BSN Entered By: Elliot Gurney, BSN, RN, CWS, Kim on 03/16/2021 11:47:12 Norlander, Jody Lowe ([id number]) -------------------------------------------------------------------------------- Vitals Details Patient Name: Jody Lowe Date of Service: 03/16/2021 11:15 AM Medical Record Number: [id number] Patient Account Number: [id number] Date of Birth/Sex: [birth year] (75 y.o. F) Treating RN: Huel Coventry Primary Care Dionna Wiedemann: Jeffie Pollock Other Clinician: Referring Lochlyn Zullo: Jeffie Pollock Treating Darrious Youman/Extender: Rowan Blase in Treatment: 2 Vital Signs Time Taken: 11:42 Temperature (F): 98.3 Height (in): 66 Pulse (bpm): 76 Weight (lbs): 199 Respiratory Rate (breaths/min): 16 Body Mass Index (BMI): 32.1 Blood Pressure (mmHg): 95/62 Reference Range: 80 - 120 mg / dl Electronic Signature(s) Signed: 03/16/2021 12:21:29 PM By: Elliot Gurney, BSN, RN, CWS, Kim RN, BSN Entered By: Elliot Gurney, BSN, RN, CWS, Kim on 03/16/2021 11:42:41

## 2021-03-30 ENCOUNTER — Other Ambulatory Visit: Payer: Self-pay

## 2021-03-30 ENCOUNTER — Encounter: Payer: Medicare Other | Attending: Physician Assistant | Admitting: Physician Assistant

## 2021-03-30 DIAGNOSIS — I129 Hypertensive chronic kidney disease with stage 1 through stage 4 chronic kidney disease, or unspecified chronic kidney disease: Secondary | ICD-10-CM | POA: Diagnosis not present

## 2021-03-30 DIAGNOSIS — L89893 Pressure ulcer of other site, stage 3: Secondary | ICD-10-CM | POA: Diagnosis present

## 2021-03-30 DIAGNOSIS — N189 Chronic kidney disease, unspecified: Secondary | ICD-10-CM | POA: Insufficient documentation

## 2021-03-30 DIAGNOSIS — D631 Anemia in chronic kidney disease: Secondary | ICD-10-CM | POA: Diagnosis not present

## 2021-03-30 NOTE — Progress Notes (Addendum)
[name], [name] ([id number]) Visit Report for 03/30/2021 Chief Complaint Document Details Patient Name: Jody Lowe, Jody Lowe. Date of Service: 03/30/2021 11:15 AM Medical Record Number: [id number] Patient Account Number: [id number] Date of Birth/Sex: 1945/05/28 (76 y.o. F) Treating RN: Angelina Pih Primary Care Provider: Jeffie Pollock Other Clinician: Referring Provider: Jeffie Pollock Treating Provider/Extender: Rowan Blase in Treatment: 4 Information Obtained from: Patient Chief Complaint Right Foot Ulcer Electronic Signature(s) Signed: 03/30/2021 11:42:59 AM By: Lenda Kelp PA-C Entered By: Lenda Kelp on 03/30/2021 11:42:58 Melena, [name] ([id number]) -------------------------------------------------------------------------------- Debridement Details Patient Name: Jody Lowe Date of Service: 03/30/2021 11:15 AM Medical Record Number: [id number] Patient Account Number: [id number] Date of Birth/Sex: [birth year] (76 y.o. F) Treating RN: Angelina Pih Primary Care Provider: Jeffie Pollock Other Clinician: Referring Provider: Jeffie Pollock Treating Provider/Extender: Rowan Blase in Treatment: 4 Debridement Performed for Wound #1 Right,Medial Metatarsal head first Assessment: Performed By: Physician Nelida Meuse., PA-C Debridement Type: Debridement Severity of Tissue Pre Debridement: Fat layer exposed Level of Consciousness (Pre- Awake and Alert procedure): Pre-procedure Verification/Time Out Yes - 12:02 Taken: Total Area Debrided (L x W): 0.3 (cm) x 0.4 (cm) = 0.12 (cm) Tissue and other material Viable, Non-Viable, Callus, Slough, Subcutaneous, Slough debrided: Level: Skin/Subcutaneous Tissue Debridement Description: Excisional Instrument: Curette Bleeding: Minimum Hemostasis Achieved: Pressure Response to Treatment: Procedure was tolerated well Level of Consciousness (Post- Awake and Alert procedure): Post Debridement  Measurements of Total Wound Length: (cm) 0.3 Stage: Category/Stage III Width: (cm) 0.4 Depth: (cm) 0.4 Volume: (cm) 0.038 Character of Wound/Ulcer Post Debridement: Stable Severity of Tissue Post Debridement: Fat layer exposed Post Procedure Diagnosis Same as Pre-procedure Electronic Signature(s) Signed: 03/30/2021 5:21:10 PM By: Lenda Kelp PA-C Signed: 04/02/2021 9:27:28 AM By: Angelina Pih Entered By: Angelina Pih on 03/30/2021 12:05:27 Patch, [name] ([id number]) -------------------------------------------------------------------------------- HPI Details Patient Name: Jody Lowe Date of Service: 03/30/2021 11:15 AM Medical Record Number: [id number] Patient Account Number: [id number] Date of Birth/Sex: 1946/03/01 (76 y.o. F) Treating RN: Angelina Pih Primary Care Provider: Jeffie Pollock Other Clinician: Referring Provider: Jeffie Pollock Treating Provider/Extender: Rowan Blase in Treatment: 4 History of Present Illness HPI Description: 02/27/2021 upon evaluation today patient presents for inspection in our office today concerning issues that she has been having over the right medial first metatarsal head. This is actually a pressure ulcer that I think developed as result of the patient actually passing out in her driveway and this incident occurred around the beginning of September 2022. She ended up October 10 having bypass surgery in regard to her leg to restore blood flow to the extremity and subsequently I think that this probably contributed to the wound development as with limited blood flow she would not have needed a whole lot of pressure getting to the area to cause a significant issue and passing out in the driveway could have definitely done that. She has been seeing Dr. Allena Katz and right now they have been using Betadine. Overall I think that we probably need to try something a little bit different to see if we can get this moving in a  better direction. With that being said the patient does have a few other medical problems that would be detailed below. Of again she did have peripheral vascular disease and subsequently she has had a bypass surgery on the extremity to restore blood flow and her most recent arterial study shows that she does have noncompressible ABIs bilaterally with a right TBI of 0.69 and a left TBI  of 0.80 again this seems to be doing decently well. I am pleased in that regard. She does have a history of hypertension, chronic kidney disease though I do not know what stage, and generalized muscle weakness. 03/09/2021 upon evaluation today patient appears to be doing about the same in regard to her foot ulcer. Notices the redness has not spread and things seem to be making progress here. Unfortunately she does still have some necrotic tissue in the center part of the wound she does have good arterial flow as noted by her arterial studies that we got back. Fortunately I think that we are good to proceed with some debridement here. I also think that she may be a candidate for hyperbaric oxygen therapy depending on how things go. 03/16/2021 upon evaluation today patient appears to be doing well with regard to her foot ulcer. She has been tolerating the dressing changes without complication. Fortunately there is no signs of active infection at this time. No fevers, chills, nausea, vomiting, or diarrhea. 03/30/2021 upon evaluation today patient appears to be doing excellent in regard to her foot ulcer. I definitely think that she is showing signs of good improvement without any complication today. I do think the Iodoflex is doing a great job for her. Electronic Signature(s) Signed: 03/30/2021 1:27:20 PM By: Lenda Kelp PA-C Entered By: Lenda Kelp on 03/30/2021 13:27:19 Hilscher, [name] ([id number]) -------------------------------------------------------------------------------- Physical Exam Details Patient Name:  Jody Lowe Date of Service: 03/30/2021 11:15 AM Medical Record Number: [id number] Patient Account Number: [id number] Date of Birth/Sex: July 04, 1945 (76 y.o. F) Treating RN: Angelina Pih Primary Care Provider: Jeffie Pollock Other Clinician: Referring Provider: Jeffie Pollock Treating Provider/Extender: Rowan Blase in Treatment: 4 Constitutional Well-nourished and well-hydrated in no acute distress. Respiratory normal breathing without difficulty. Psychiatric this patient is able to make decisions and demonstrates good insight into disease process. Alert and Oriented x 3. pleasant and cooperative. Notes Upon inspection patient's wound bed actually showed signs of good granulation and epithelization at this point. Fortunately I do not see any evidence of infection currently and overall I think the patient is managing quite nicely from the standpoint of this wound. The base of the wound once I debrided it actually appears to be very healthy. Electronic Signature(s) Signed: 03/30/2021 1:27:41 PM By: Lenda Kelp PA-C Entered By: Lenda Kelp on 03/30/2021 13:27:40 Stempel, [name] ([id number]) -------------------------------------------------------------------------------- Physician Orders Details Patient Name: Jody Lowe Date of Service: 03/30/2021 11:15 AM Medical Record Number: [id number] Patient Account Number: [id number] Date of Birth/Sex: [birth year] (76 y.o. 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 o Nurse Visit as needed Home Health o Home Health Company: - [place] o [facility] for wound care. May utilize formulary equivalent dressing for wound treatment orders unless otherwise specified. Home Health Nurse may visit PRN to address patientos wound care needs. o Scheduled days for dressing changes to be completed; exception, patient has scheduled wound care visit that day. o **Please direct any NON-WOUND related issues/requests for orders to patient's Primary Care Physician. **If current dressing causes regression in wound condition, may D/C ordered dressing product/s  and apply Normal Saline Moist Dressing daily until next Wound Healing Center or Other MD appointment. **Notify Wound Healing Center of regression in wound condition at [phone]. Bathing/ Shower/ Hygiene o Wash wounds with antibacterial soap and water. - Keep dressing dry or change after shower o No tub bath. Anesthetic (Use 'Patient Medications' Section for Anesthetic Order Entry) o Lidocaine applied to wound bed Edema Control - Lymphedema / Segmental Compressive Device / Other o Elevate leg(s) parallel to the floor when sitting. o DO YOUR BEST to sleep in the bed at night. DO NOT sleep in your recliner. Long hours of sitting in a recliner leads to swelling of the legs and/or potential wounds on your backside. Off-Loading o Open toe surgical shoe - Or alternative/keep pressure off of wound o Turn and reposition every 2 hours Additional Orders / Instructions o Decrease/Stop Smoking o Follow Nutritious Diet and Increase Protein Intake Medications-Please add to medication list. o P.O. Antibiotics - take as prescribed Wound Treatment Wound #1 - Metatarsal head first Wound Laterality: Right, Medial Cleanser: Normal Saline 3 x Per Week/15 Days Discharge Instructions: Wash your hands with soap and water. Remove old dressing, discard  into plastic bag and place into trash. Cleanse the wound with Normal Saline prior to applying a clean dressing using gauze sponges, not tissues or cotton balls. Do not scrub or use excessive force. Pat dry using gauze sponges, not tissue or cotton balls. Cleanser: Soap and Water 3 x Per Week/15 Days Discharge Instructions: Gently cleanse wound with antibacterial soap, rinse and pat dry prior to dressing wounds Appleton, Mardell M. ([id number]) Primary Dressing: IODOFLEX 0.9% Cadexomer Iodine Pad 3 x Per Week/15 Days Discharge Instructions: Apply Iodoflex to wound bed only as directed. Secondary Dressing: Zetuvit Plus Silicone Border Dressing 4x4 (in/in) 3 x Per Week/15 Days Electronic Signature(s) Signed: 03/30/2021 5:21:10 PM By: Lenda KelpStone III, Keylah Darwish PA-C Signed: 04/02/2021 9:27:28 AM By: Angelina PihGordon, Caitlin Entered By: Angelina PihGordon, Caitlin on 03/30/2021 12:16:45 Rufo, Daine GravelSHIRLEY M. ([id number]) -------------------------------------------------------------------------------- Problem List Details Patient Name: Jody SinnerATE, Yeimi M. Date of Service: 03/30/2021 11:15 AM Medical Record Number: [id number] Patient Account Number: [id number] Date of Birth/Sex: Oct 14, 1945 (76 y.o. 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  ([id number]) -------------------------------------------------------------------------------- Progress Note Details Patient Name: Jody SinnerATE, Adalyne M. Date of Service: 03/30/2021 11:15 AM Medical Record Number: [id number] Patient Account Number: [id number] Date of Birth/Sex: Oct 14, 1945 (76 y.o. F) Treating RN: Angelina PihGordon, Caitlin Primary Care Provider: Jeffie PollockSAINT-SURIN, TAMARA Other Clinician: Referring Provider: Jeffie PollockSAINT-SURIN, TAMARA Treating Provider/Extender: Rowan BlaseStone, Aniza Shor Weeks in Treatment: 4 Subjective Chief Complaint Information obtained from Patient Right Foot Ulcer History of Present Illness (HPI) 02/27/2021 upon evaluation today patient presents for inspection in our office today concerning issues that she has been having over the right medial first metatarsal head. This is actually a pressure ulcer that I think developed as result of the patient actually passing out in her driveway and this incident occurred around the beginning of September 2022. She ended up  October 10 having bypass surgery in regard to her leg to restore blood flow to the extremity and subsequently I think that this probably contributed to the wound development as with limited blood flow she would not have needed a whole lot of pressure getting to the area to cause a significant issue and passing out in the driveway could have definitely done that. She has been seeing Dr. Allena Katz and right now they have been using Betadine. Overall I think that we probably need to try something a little bit different to see if we can get this moving in a better direction. With that being said the patient does have a few other medical problems that would be detailed below. Of again she did have peripheral vascular disease and subsequently she has had a bypass surgery on the extremity to restore blood flow and her most recent arterial study shows that she does have noncompressible ABIs bilaterally with a right TBI of 0.69 and a left TBI of 0.80 again  this seems to be doing decently well. I am pleased in that regard. She does have a history of hypertension, chronic kidney disease though I do not know what stage, and generalized muscle weakness. 03/09/2021 upon evaluation today patient appears to be doing about the same in regard to her foot ulcer. Notices the redness has not spread and things seem to be making progress here. Unfortunately she does still have some necrotic tissue in the center part of the wound she does have good arterial flow as noted by her arterial studies that we got back. Fortunately I think that we are good to proceed with some debridement here. I also think that she may be a candidate for hyperbaric oxygen therapy depending on how things go. 03/16/2021 upon evaluation today patient appears to be doing well with regard to her foot ulcer. She has been tolerating the dressing changes without complication. Fortunately there is no signs of active infection at this time. No fevers, chills, nausea, vomiting, or diarrhea. 03/30/2021 upon evaluation today patient appears to be doing excellent in regard to her foot ulcer. I definitely think that she is showing signs of good improvement without any complication today. I do think the Iodoflex is doing a great job for her. Objective Constitutional Well-nourished and well-hydrated in no acute distress. Vitals Time Taken: 11:49 AM, Height: 66 in, Weight: 199 lbs, BMI: 32.1, Temperature: 97.9 F, Pulse: 89 bpm, Respiratory Rate: 18 breaths/min, Blood Pressure: 125/68 mmHg. Respiratory normal breathing without difficulty. Psychiatric this patient is able to make decisions and demonstrates good insight into disease process. Alert and Oriented x 3. pleasant and cooperative. General Notes: Upon inspection patient's wound bed actually showed signs of good granulation and epithelization at this point. Fortunately I do not see any evidence of infection currently and overall I think the patient  is managing quite nicely from the standpoint of this wound. The base of the wound once I debrided it actually appears to be very healthy. Integumentary (Hair, Skin) Wound #1 status is Open. Original cause of wound was Blister. The date acquired was: 11/23/2020. The wound has been in treatment 4 weeks. The wound is located on the Right,Medial Metatarsal head first. The wound measures 0.3cm length x 0.4cm width x 0.3cm depth; 0.094cm^2 area and 0.028cm^3 volume. There is Fat Layer (Subcutaneous Tissue) exposed. There is no tunneling or undermining noted. There is a medium amount of serosanguineous drainage noted. There is medium (34-66%) pale granulation within the wound bed. There is a  medium (34-66%) Broers, Kellina M. ([id number]) amount of necrotic tissue within the wound bed including Eschar and Adherent Slough. Assessment Active Problems ICD-10 Other specified peripheral vascular diseases Pressure ulcer of other site, stage 3 Essential (primary) hypertension Chronic kidney disease, unspecified Muscle weakness (generalized) Procedures Wound #1 Pre-procedure diagnosis of Wound #1 is a Pressure Ulcer located on the Right,Medial Metatarsal head first .Severity of Tissue Pre Debridement is: Fat layer exposed. There was a Excisional Skin/Subcutaneous Tissue Debridement with a total area of 0.12 sq cm performed by Nelida Meuse., PA-C. With the following instrument(s): Curette to remove Viable and Non-Viable tissue/material. Material removed includes Callus, Subcutaneous Tissue, and Slough. No specimens were taken. A time out was conducted at 12:02, prior to the start of the procedure. A Minimum amount of bleeding was controlled with Pressure. The procedure was tolerated well. Post Debridement Measurements: 0.3cm length x 0.4cm width x 0.4cm depth; 0.038cm^3 volume. Post debridement Stage noted as Category/Stage III. Character of Wound/Ulcer Post Debridement is stable. Severity of Tissue Post  Debridement is: Fat layer exposed. Post procedure Diagnosis Wound #1: Same as Pre-Procedure Plan Follow-up Appointments: Return Appointment in 2 weeks. Nurse Visit as needed Home Health: Home Health Company: - [phone] [facility] for wound care. May utilize formulary equivalent dressing for wound treatment orders unless otherwise specified. Home Health Nurse may visit PRN to address patient s wound care needs. Scheduled days for dressing changes to be completed; exception, patient has scheduled wound care visit that day. **Please direct any NON-WOUND related issues/requests for orders to patient's Primary Care Physician. **If current dressing causes regression in wound condition, may D/C ordered dressing product/s and apply Normal Saline Moist Dressing daily until next Wound Healing Center or Other MD appointment. **Notify Wound Healing Center of regression in wound condition at [phone]. Bathing/ Shower/ Hygiene: Wash wounds with antibacterial soap and water. - Keep dressing dry or change after shower No tub bath. Anesthetic (Use 'Patient Medications' Section for Anesthetic Order Entry): Lidocaine applied to wound bed Edema Control - Lymphedema / Segmental Compressive Device / Other: Elevate leg(s) parallel to the floor when sitting. DO YOUR BEST to sleep in the bed at night. DO NOT sleep in your recliner. Long hours of sitting in a recliner leads to swelling of the legs and/or potential wounds on your backside. Off-Loading: Open toe surgical shoe - Or alternative/keep pressure off of wound Turn and reposition every 2 hours Additional Orders / Instructions: Decrease/Stop Smoking Follow Nutritious Diet and Increase Protein Intake Medications-Please add to medication list.: P.O. Antibiotics - take as prescribed WOUND #1: - Metatarsal head first Wound Laterality: Right, Medial Cleanser: Normal Saline 3 x Per Week/15 Days Discharge Instructions: Wash your hands with soap  and water. Remove old dressing, discard into plastic bag and place into trash. Cleanse the wound with Normal Saline prior to applying a clean dressing using gauze sponges, not tissues or cotton balls. Do not scrub or use excessive force. Pat dry using gauze sponges, not tissue or cotton balls. Cleanser: Soap and Water 3 x Per Week/15 Days Discharge Instructions: Gently cleanse wound with antibacterial soap, rinse and pat dry prior to dressing wounds Caffee, Karmina M. ([id number]) Primary Dressing: IODOFLEX 0.9% Cadexomer Iodine Pad 3 x Per Week/15 Days Discharge Instructions: Apply Iodoflex to wound bed only as directed. Secondary Dressing: Zetuvit Plus Silicone Border Dressing 4x4 (in/in) 3 x Per Week/15 Days 1. Would recommend that we going to continue with the wound care measures as before and the patient is  in agreement the plan this includes the use of the Iodoflex which I think is doing an awesome job. 2. I am also can recommend that we have the patient continue with the border foam dressing to cover which I think is also doing quite well. We will see patient back for reevaluation in 2 weeks here in the clinic. If anything worsens or changes patient will contact our office for additional recommendations. This is patient's preference and she does have home health taking care of this on the off weeks as well. Electronic Signature(s) Signed: 03/30/2021 1:28:19 PM By: Lenda KelpStone III, Sydelle Sherfield PA-C Entered By: Lenda KelpStone III, Vale Peraza on 03/30/2021 13:28:18 Cendejas, Daine GravelSHIRLEY M. ([id number]) -------------------------------------------------------------------------------- SuperBill Details Patient Name: Jody SinnerATE, Hillarie M. Date of Service: 03/30/2021 Medical Record Number: [id number] Patient Account Number: [id number] Date of Birth/Sex: 1945-10-03 (76 y.o. 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, stage 3 I10 Essential (primary) hypertension N18.9 Chronic kidney disease, unspecified M62.81 Muscle weakness (generalized) Facility Procedures CPT4 Code: [id number] Description: 11042 - DEB SUBQ TISSUE 20 SQ CM/< Modifier: Quantity: 1 CPT4 Code: Description: ICD-10 Diagnosis Description 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

## 2021-03-30 NOTE — Progress Notes (Addendum)
Jody Lowe, Jody M. ([id number]) Visit Report for 03/30/2021 Arrival Information Details Patient Name: Jody Lowe, Jody M. Date of Service: 03/30/2021 11:15 AM Medical Record Number: [id number] Patient Account Number: [id number] Date of Birth/Sex: March 02, 1946 (76 y.o. F) Treating RN: Jody Lowe Primary Care Jody Lowe: Jody Lowe Other Clinician: Referring Jody Lowe: Jody Lowe Treating Jody Lowe/Extender: Jody Lowe in Lowe: 4 Visit Information History Since Last Visit Added or deleted any medications: No Patient Arrived: Wheel Chair Any new allergies or adverse reactions: No Arrival Time: 11:45 Had a fall or experienced change in Yes Accompanied By: son activities of daily living that may affect Transfer Assistance: EasyPivot Patient Lift risk of falls: Patient Identification Verified: Yes Hospitalized since last visit: No Secondary Verification Process Completed: Yes Has Dressing in Place as Prescribed: Yes Patient Requires Transmission-Based No Pain Present Now: Yes Precautions: Patient Has Alerts: Yes Patient Alerts: Patient on Blood Thinner Eliquis/ASA 81mg  [facility] ABI-N/C Nov '22 TBI-L 0.80; R 0.69 NOT diabetic Electronic Signature(s) Signed: 04/02/2021 9:27:28 AM By: Jody Lowe Entered By: Jody Lowe on 03/30/2021 11:47:51 Jody Lowe, [name] ([id number]) -------------------------------------------------------------------------------- Encounter Discharge Information Details Patient Name: Jody Lowe, Jody M. Date of Service: 03/30/2021 11:15 AM Medical Record Number: [id number] Patient Account Number: [id number] Date of Birth/Sex: March 02, 1946 (76 y.o. F) Treating RN: Jody Lowe Primary Care Jody Lowe: Jody Lowe Other Clinician: Referring Jourdyn Hasler: Jody Lowe Treating Jody Lowe: Jody Lowe in Lowe: 4 Encounter Discharge Information Items Post Procedure Vitals Discharge Condition: Stable Temperature (F):  97.9 Ambulatory Status: Wheelchair Pulse (bpm): 89 Discharge Destination: Home Respiratory Rate (breaths/min): 18 Transportation: Private Auto Blood Pressure (mmHg): 125/68 Accompanied By: son Schedule Follow-up Appointment: Yes Clinical Summary of Care: Electronic Signature(s) Signed: 03/30/2021 1:23:27 PM By: Jody Lowe Entered By: Jody Lowe on 03/30/2021 13:23:27 [name], [name] -------------------------------------------------------------------------------- Lower Extremity Assessment Details Patient Name: Jody Lowe, Jody M. Date of Service: 03/30/2021 11:15 AM Medical Record Number: [id number] Patient Account Number: [id number] Date of Birth/Sex: March 02, 1946 (76 y.o. F) Treating RN: Jody Lowe Primary Care Jody Lowe: Jody Lowe Other Clinician: Referring Airi Copado: Jody Lowe Treating Jody Lowe in Lowe: 4 Edema Assessment Assessed: [Left: No] [Right: No] Edema: [Left: Ye] [Right: s] Calf Left: Right: Point of Measurement: 36 cm From Medial Instep 40 cm Ankle Left: Right: Point of Measurement: 10 cm From Medial Instep 24 cm Vascular Assessment Pulses: Dorsalis Pedis Palpable: [Right:Yes] Electronic Signature(s) Signed: 04/02/2021 9:27:28 AM By: Jody Lowe Entered By: Jody Lowe on 03/30/2021 11:56:58 [name], [name] -------------------------------------------------------------------------------- Multi Wound Chart Details Patient Name: Jody Lowe, Jody M. Date of Service: 03/30/2021 11:15 AM Medical Record Number: [id number] Patient Account Number: [id number] Date of Birth/Sex: March 02, 1946 (76 y.o. F) Treating RN: Jody Lowe Primary Care Jody Lowe: Jody Lowe Other Clinician: Referring Jody Lowe: Jody Lowe Treating Jody Lowe: Jody Lowe in Lowe: 4 Vital Signs Height(in): 66 Pulse(bpm): 89 Weight(lbs): 199 Blood Pressure(mmHg):  125/68 Body Mass Index(BMI): 32 Temperature(F): 97.9 Respiratory Rate(breaths/min): 18 Photos: [N/A:N/A] Wound Location: Right, Medial Metatarsal head first N/A N/A Wounding Event: Blister N/A N/A Primary Etiology: Pressure Ulcer N/A N/A Secondary Etiology: Arterial Insufficiency Ulcer N/A N/A Comorbid History: Cataracts, Anemia, Asthma, N/A N/A Coronary Artery Disease, Deep Vein Thrombosis, Hypertension, Gout, Osteoarthritis Date Acquired: 11/23/2020 N/A N/A Lowe of Lowe: 4 N/A N/A Wound Status: Open N/A N/A Measurements L x W x D (cm) 0.3x0.4x0.3 N/A N/A Area (cm) : 0.094 N/A N/A Volume (cm) : 0.028 N/A N/A % Reduction in Area: 25.40% N/A N/A % Reduction in Volume: 55.60% N/A N/A Classification: Category/Stage III N/A N/A Exudate  Amount: Medium N/A N/A Exudate Type: Serosanguineous N/A N/A Exudate Color: red, brown N/A N/A Granulation Amount: Medium (34-66%) N/A N/A Granulation Quality: Pale N/A N/A Necrotic Amount: Medium (34-66%) N/A N/A Necrotic Tissue: Eschar, Adherent Slough N/A N/A Exposed Structures: Fat Layer (Subcutaneous Tissue): N/A N/A Yes Epithelialization: None N/A N/A Lowe Notes Electronic Signature(s) Signed: 04/02/2021 9:27:28 AM By: Jody Pih Entered By: Jody Pih on 03/30/2021 12:01:01 [name], [name] -------------------------------------------------------------------------------- Multi-Disciplinary Care Plan Details Patient Name: Jody Lowe Date of Service: 03/30/2021 11:15 AM Medical Record Number: [id number] Patient Account Number: [id number] Date of Birth/Sex: 09/22/1945 (76 y.o. F) Treating RN: Jody Pih Primary Care Arneda Sappington: Jody Pollock Other Clinician: Referring Claretta Kendra: Jody Pollock Treating Brinly Maietta/Extender: Jody Blase in Lowe: 4 Active Inactive Wound/Skin Impairment Nursing Diagnoses: Impaired tissue integrity Knowledge deficit related to smoking impact on  wound healing Knowledge deficit related to ulceration/compromised skin integrity Goals: Patient will demonstrate a reduced rate of smoking or cessation of smoking Date Initiated: 02/27/2021 Target Resolution Date: 04/25/2021 Goal Status: Active Ulcer/skin breakdown will have a volume reduction of 30% by week 4 Date Initiated: 02/27/2021 Target Resolution Date: 03/27/2021 Goal Status: Active Ulcer/skin breakdown will have a volume reduction of 50% by week 8 Date Initiated: 02/27/2021 Target Resolution Date: 04/24/2021 Goal Status: Active Ulcer/skin breakdown will have a volume reduction of 80% by week 12 Date Initiated: 02/27/2021 Target Resolution Date: 05/22/2021 Goal Status: Active Ulcer/skin breakdown will heal within 14 Lowe Date Initiated: 02/27/2021 Target Resolution Date: 06/12/2021 Goal Status: Active Interventions: Assess patient/caregiver ability to obtain necessary supplies Assess patient/caregiver ability to perform ulcer/skin care regimen upon admission and as needed Assess ulceration(s) every visit Provide education on smoking Notes: Electronic Signature(s) Signed: 04/02/2021 9:27:28 AM By: Jody Pih Entered By: Jody Pih on 03/30/2021 12:00:47 Jody Lowe, Jody Lowe ([id number]) -------------------------------------------------------------------------------- Pain Assessment Details Patient Name: Jody Lowe Date of Service: 03/30/2021 11:15 AM Medical Record Number: [id number] Patient Account Number: [id number] Date of Birth/Sex: 1945-08-10 (75 y.o. F) Treating RN: Jody Pih Primary Care Jshaun Abernathy: Jody Pollock Other Clinician: Referring Arianie Couse: Jody Pollock Treating Kerith Sherley/Extender: Jody Blase in Lowe: 4 Active Problems Location of Pain Severity and Description of Pain Patient Has Paino Yes Site Locations Rate the pain. Current Pain Level: 5 Pain Management and Medication Current Pain Management: Notes patient states  fell twice last week and has pain on her right side of rib cage but that it is improving Electronic Signature(s) Signed: 04/02/2021 9:27:28 AM By: Jody Pih Entered By: Jody Pih on 03/30/2021 11:50:19 Jody Lowe, Jody Lowe ([id number]) -------------------------------------------------------------------------------- Patient/Caregiver Education Details Patient Name: Jody Lowe Date of Service: 03/30/2021 11:15 AM Medical Record Number: [id number] Patient Account Number: [id number] Date of Birth/Gender: 1946-01-31 (75 y.o. F) Treating RN: Jody Pih Primary Care Physician: Jody Pollock Other Clinician: Referring Physician: Jeffie Pollock Treating Physician/Extender: Jody Blase in Lowe: 4 Education Assessment Education Provided To: Patient Education Topics Provided Wound/Skin Impairment: Handouts: Caring for Your Ulcer Methods: Explain/Verbal Responses: State content correctly Electronic Signature(s) Signed: 04/02/2021 9:27:28 AM By: Jody Pih Entered By: Jody Pih on 03/30/2021 13:21:42 Jody Lowe, Jody Lowe ([id number]) -------------------------------------------------------------------------------- Wound Assessment Details Patient Name: Jody Lowe Date of Service: 03/30/2021 11:15 AM Medical Record Number: [id number] Patient Account Number: [id number] Date of Birth/Sex: 1945-05-02 (75 y.o. F) Treating RN: Jody Pih Primary Care Enrique Weiss: Jody Pollock Other Clinician: Referring Lars Jeziorski: Jody Pollock Treating Dailyn Reith/Extender: Jody Blase in Lowe: 4 Wound Status Wound Number: 1 Primary Pressure Ulcer Etiology: Wound Location: Right, Medial Metatarsal head first  Secondary Arterial Insufficiency Ulcer Wounding Event: Blister Etiology: Date Acquired: 11/23/2020 Wound Open Lowe Of Lowe: 4 Status: Clustered Wound: No Comorbid Cataracts, Anemia, Asthma, Coronary Artery Disease, History: Deep  Vein Thrombosis, Hypertension, Gout, Osteoarthritis Photos Wound Measurements Length: (cm) 0.3 Width: (cm) 0.4 Depth: (cm) 0.3 Area: (cm) 0.094 Volume: (cm) 0.028 % Reduction in Area: 25.4% % Reduction in Volume: 55.6% Epithelialization: None Tunneling: No Undermining: No Wound Description Classification: Category/Stage III Exudate Amount: Medium Exudate Type: Serosanguineous Exudate Color: red, brown Foul Odor After Cleansing: No Slough/Fibrino Yes Wound Bed Granulation Amount: Medium (34-66%) Exposed Structure Granulation Quality: Pale Fat Layer (Subcutaneous Tissue) Exposed: Yes Necrotic Amount: Medium (34-66%) Necrotic Quality: Eschar, Adherent Slough Lowe Notes Wound #1 (Metatarsal head first) Wound Laterality: Right, Medial Cleanser Normal Saline Discharge Instruction: Wash your hands with soap and water. Remove old dressing, discard into plastic bag and place into trash. Cleanse the wound with Normal Saline prior to applying a clean dressing using gauze sponges, not tissues or cotton balls. Do not scrub or use excessive force. Pat dry using gauze sponges, not tissue or cotton balls. Soap and Water Lamke, SOFIAH LYNE ([id number]) Discharge Instruction: Gently cleanse wound with antibacterial soap, rinse and pat dry prior to dressing wounds Peri-Wound Care Topical Primary Dressing IODOFLEX 0.9% Cadexomer Iodine Pad Discharge Instruction: Apply Iodoflex to wound bed only as directed. Secondary Dressing Zetuvit Plus Silicone Border Dressing 4x4 (in/in) Secured With Compression Wrap Compression Stockings Add-Ons Electronic Signature(s) Signed: 04/02/2021 9:27:28 AM By: Jody Pih Entered By: Jody Pih on 03/30/2021 11:55:43 Jody Lowe, Jody Lowe ([id number]) -------------------------------------------------------------------------------- Vitals Details Patient Name: Jody Lowe Date of Service: 03/30/2021 11:15 AM Medical Record Number:  [id number] Patient Account Number: [id number] Date of Birth/Sex: [birth year] (75 y.o. F) Treating RN: Jody Pih Primary Care Getsemani Lindon: Jody Pollock Other Clinician: Referring Kamyrah Feeser: Jody Pollock Treating Ritaj Dullea/Extender: Jody Blase in Lowe: 4 Vital Signs Time Taken: 11:49 Temperature (F): 97.9 Height (in): 66 Pulse (bpm): 89 Weight (lbs): 199 Respiratory Rate (breaths/min): 18 Body Mass Index (BMI): 32.1 Blood Pressure (mmHg): 125/68 Reference Range: 80 - 120 mg / dl Electronic Signature(s) Signed: 04/02/2021 9:27:28 AM By: Jody Pih Entered By: Jody Pih on 03/30/2021 11:49:37

## 2021-04-13 ENCOUNTER — Ambulatory Visit: Payer: Medicare Other | Admitting: Internal Medicine

## 2021-04-20 ENCOUNTER — Other Ambulatory Visit: Payer: Self-pay

## 2021-04-20 ENCOUNTER — Encounter: Payer: Medicare Other | Admitting: Physician Assistant

## 2021-04-20 DIAGNOSIS — L89893 Pressure ulcer of other site, stage 3: Secondary | ICD-10-CM | POA: Diagnosis not present

## 2021-04-20 NOTE — Progress Notes (Addendum)
Jody Lowe, Jody M. ([id number]) Visit Report for 04/20/2021 Chief Complaint Document Details Patient Name: Jody Lowe, Jody M. Date of Service: 04/20/2021 2:30 PM Medical Record Number: [id number] Patient Account Number: [id number] Date of Birth/Sex: [birth year] (76 y.o. F) Treating RN: Hansel FeinsteinBishop, Joy Primary Care Provider: Jeffie PollockSaint-Surin, Tamara Other Clinician: Referring Provider: Jeffie PollockSaint-Surin, Tamara Treating Provider/Extender: Rowan BlaseStone, Shaune Westfall Weeks in Treatment: 7 Information Obtained from: Patient Chief Complaint Right Foot Ulcer Electronic Signature(s) Signed: 04/20/2021 2:43:36 PM By: Lenda KelpStone III, Seung Nidiffer PA-C Entered By: Lenda KelpStone III, Aleece Loyd on 04/20/2021 14:43:35 Harkins, [name] ([id number]) -------------------------------------------------------------------------------- Debridement Details Patient Name: Jody Lowe, Jody M. Date of Service: 04/20/2021 2:30 PM Medical Record Number: [id number] Patient Account Number: [id number] Date of Birth/Sex: [birth year] (76 y.o. F) Treating RN: Hansel FeinsteinBishop, Joy Primary Care Provider: Jeffie PollockSaint-Surin, Tamara Other Clinician: Referring Provider: Jeffie PollockSaint-Surin, Tamara Treating Provider/Extender: Rowan BlaseStone, Lillah Standre Weeks in Treatment: 7 Debridement Performed for Wound #1 Right,Medial Metatarsal head first Assessment: Performed By: Physician Nelida MeuseStone, Ramina Hulet E., PA-C Debridement Type: Debridement Severity of Tissue Pre Debridement: Fat layer exposed Level of Consciousness (Pre- Awake and Alert procedure): Pre-procedure Verification/Time Out Yes - 15:05 Taken: Start Time: 15:06 Pain Control: Lidocaine Total Area Debrided (L x W): 0.5 (cm) x 0.5 (cm) = 0.25 (cm) Tissue and other material Viable, Non-Viable, Slough, Subcutaneous, Skin: Dermis , Slough debrided: Level: Skin/Subcutaneous Tissue Debridement Description: Excisional Instrument: Curette Bleeding: Minimum Hemostasis Achieved: Pressure Response to Treatment: Procedure was tolerated well Level of Consciousness (Post- Awake  and Alert procedure): Post Debridement Measurements of Total Wound Length: (cm) 0.3 Stage: Category/Stage III Width: (cm) 0.3 Depth: (cm) 0.3 Volume: (cm) 0.021 Character of Wound/Ulcer Post Debridement: Improved Severity of Tissue Post Debridement: Fat layer exposed Post Procedure Diagnosis Same as Pre-procedure Electronic Signature(s) Signed: 04/20/2021 4:05:14 PM By: Hansel FeinsteinBishop, Joy Signed: 04/20/2021 4:30:05 PM By: Lenda KelpStone III, Anup Brigham PA-C Entered By: Hansel FeinsteinBishop, Joy on 04/20/2021 15:08:35 [name], [name] ([id number]) -------------------------------------------------------------------------------- HPI Details Patient Name: Jody Lowe, Smt. M. Date of Service: 04/20/2021 2:30 PM Medical Record Number: [id number] Patient Account Number: [id number] Date of Birth/Sex: [birth year] (76 y.o. F) Treating RN: Hansel FeinsteinBishop, Joy Primary Care Provider: Jeffie PollockSaint-Surin, Tamara Other Clinician: Referring Provider: Jeffie PollockSaint-Surin, Tamara Treating Provider/Extender: Rowan BlaseStone, Zyren Sevigny Weeks in Treatment: 7 History of Present Illness HPI Description: 02/27/2021 upon evaluation today patient presents for inspection in our office today concerning issues that she has been having over the right medial first metatarsal head. This is actually a pressure ulcer that I think developed as result of the patient actually passing out in her driveway and this incident occurred around the beginning of September 2022. She ended up October 10 having bypass surgery in regard to her leg to restore blood flow to the extremity and subsequently I think that this probably contributed to the wound development as with limited blood flow she would not have needed a whole lot of pressure getting to the area to cause a significant issue and passing out in the driveway could have definitely done that. She has been seeing Dr. Allena KatzPatel and right now they have been using Betadine. Overall I think that we probably need to try something a little bit different to see if  we can get this moving in a better direction. With that being said the patient does have a few other medical problems that would be detailed below. Of again she did have peripheral vascular disease and subsequently she has had a bypass surgery on the extremity to restore blood flow and her most recent arterial study shows that she does have noncompressible ABIs bilaterally with a  right TBI of 0.69 and a left TBI of 0.80 again this seems to be doing decently well. I am pleased in that regard. She does have a history of hypertension, chronic kidney disease though I do not know what stage, and generalized muscle weakness. 03/09/2021 upon evaluation today patient appears to be doing about the same in regard to her foot ulcer. Notices the redness has not spread and things seem to be making progress here. Unfortunately she does still have some necrotic tissue in the center part of the wound she does have good arterial flow as noted by her arterial studies that we got back. Fortunately I think that we are good to proceed with some debridement here. I also think that she may be a candidate for hyperbaric oxygen therapy depending on how things go. 03/16/2021 upon evaluation today patient appears to be doing well with regard to her foot ulcer. She has been tolerating the dressing changes without complication. Fortunately there is no signs of active infection at this time. No fevers, chills, nausea, vomiting, or diarrhea. 03/30/2021 upon evaluation today patient appears to be doing excellent in regard to her foot ulcer. I definitely think that she is showing signs of good improvement without any complication today. I do think the Iodoflex is doing a great job for her. 04/20/2021 upon evaluation today patient appears to be doing well with regard to her wound. Fortunately there does not appear to be any signs of active infection at this time locally nor systemically. Overall I am actually pleased with where things  stand she does have some callus around the edge of the wound but underneath there seems to be a lot of new skin which is great news. Electronic Signature(s) Signed: 04/20/2021 3:13:37 PM By: Lenda KelpStone III, Nitasha Jewel PA-C Entered By: Lenda KelpStone III, Kemonie Cutillo on 04/20/2021 15:13:37 Dommer, [name] ([id number]) -------------------------------------------------------------------------------- Physical Exam Details Patient Name: Jody Lowe, Jody M. Date of Service: 04/20/2021 2:30 PM Medical Record Number: [id number] Patient Account Number: [id number] Date of Birth/Sex: [birth year] (76 y.o. F) Treating RN: Hansel FeinsteinBishop, Joy Primary Care Provider: Jeffie PollockSaint-Surin, Tamara Other Clinician: Referring Provider: Jeffie PollockSaint-Surin, Tamara Treating Provider/Extender: Rowan BlaseStone, Amirrah Quigley Weeks in Treatment: 7 Constitutional Well-nourished and well-hydrated in no acute distress. Respiratory normal breathing without difficulty. Psychiatric this patient is able to make decisions and demonstrates good insight into disease process. Alert and Oriented x 3. pleasant and cooperative. Notes Upon inspection patient's wound bed showed signs of good granulation and epithelization I did remove some of the necrotic debris today which she tolerated without complication and postdebridement the wound bed actually appears to be doing much better I think were getting very close to complete resolution. She is having some issues with swelling of her right leg in particular and some small little blisters nothing is legitimately open right now but nonetheless I think She probably does need some compression socks ongoing. Electronic Signature(s) Signed: 04/20/2021 3:14:00 PM By: Lenda KelpStone III, Okey Zelek PA-C Entered By: Lenda KelpStone III, Gabrille Kilbride on 04/20/2021 15:13:59 Granito, [name] ([id number]) -------------------------------------------------------------------------------- Physician Orders Details Patient Name: Jody Lowe, Taurus M. Date of Service: 04/20/2021 2:30 PM Medical Record  Number: [id number] Patient Account Number: [id number] Date of Birth/Sex: [birth year] (76 y.o. 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 o Nurse Visit as needed Home Health o Home Health Company: - [facility] o [facility] for wound care. May utilize formulary equivalent dressing for wound treatment orders unless otherwise specified. Home Health Nurse may visit PRN to address patientos wound care needs. - Wrap needs changed twice weekly- o Scheduled days for dressing changes to be completed; exception, patient has scheduled wound care visit that day. o **Please direct any NON-WOUND related issues/requests for orders to patient's Primary Care Physician. **If current dressing causes regression in wound condition, may D/C ordered dressing product/s and apply Normal Saline Moist Dressing daily until next Wound Healing Center or Other MD appointment. **Notify Wound Healing Center of regression in wound condition at [phone]. Bathing/ Shower/ Hygiene o Wash wounds with antibacterial soap and water. - Keep dressing dry or change after shower o May shower with wound dressing protected with water repellent cover or cast protector. o No tub bath. Anesthetic (Use 'Patient Medications' Section for Anesthetic Order Entry) o Lidocaine applied to wound bed Edema Control - Lymphedema / Segmental Compressive Device / Other o Patient to wear own compression stockings. Remove compression  stockings every night before going to bed and put on every morning when getting up. - see measurements to buy or order them -use on left leg now and then on right leg after compression wrap is off o Elevate leg(s) parallel to the floor when sitting. o DO YOUR BEST to sleep in the bed at night. DO NOT sleep in your recliner. Long hours of sitting in a recliner leads to swelling of the legs and/or potential wounds on your backside. Off-Loading o Open toe surgical shoe - Or alternative/keep pressure off of wound o Turn and reposition every 2 hours Additional Orders / Instructions o Decrease/Stop Smoking o Follow Nutritious Diet and Increase Protein Intake Wound Treatment Wound #1 - Metatarsal head first Wound Laterality: Right, Medial Cleanser: Normal Saline 2 x Per Week/15 Days Discharge Instructions: Wash your hands with soap and water. Remove old dressing, discard into plastic bag and place into trash. Cleanse the wound with Normal Saline prior to applying a clean dressing using gauze sponges, not tissues or cotton balls. Do not scrub or use excessive force. Pat dry using gauze sponges, not tissue or cotton balls. Cleanser: Soap and Water 2 x Per Week/15 Days Discharge Instructions: Gently cleanse wound with antibacterial soap, rinse and pat dry prior to dressing wounds Curl, Malynda M. ([id number]) Primary Dressing: IODOFLEX 0.9% Cadexomer Iodine Pad 2 x Per Week/15 Days Discharge Instructions: Apply Iodoflex to wound bed only as directed. Secondary Dressing: ABD Pad 5x9 (in/in) 2 x Per Week/15 Days Discharge Instructions: Cover with ABD pad or gauze Secondary Dressing: Gauze 2 x Per Week/15 Days Compression Wrap: Profore Lite LF 3 Multilayer Compression Bandaging System 2 x Per Week/15 Days Discharge Instructions: Apply 3 multi-layer wrap as prescribed. Electronic Signature(s) Signed: 04/20/2021 4:05:14 PM By: Hansel Feinstein Signed: 04/20/2021 4:30:05 PM By: Lenda Kelp  PA-C Entered By: Hansel Feinstein on 04/20/2021 15:26:00 Sandate, Jody Gravel ([id number]) -------------------------------------------------------------------------------- Problem List Details Patient Name: Jody Lowe Date of Service: 04/20/2021 2:30 PM Medical Record Number: [id number] Patient Account Number: [id number] Date of Birth/Sex: 10/15/1945 (76 y.o. 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, stage 3 02/27/2021 No Yes I10 Essential (primary) hypertension 02/27/2021 No Yes N18.9 Chronic kidney disease, unspecified 02/27/2021 No Yes M62.81 Muscle weakness (generalized) 02/27/2021 No Yes Inactive Problems Resolved Problems Electronic Signature(s) Signed: 04/20/2021 2:43:12 PM By: Lenda Kelp PA-C Entered By: Lenda Kelp on 04/20/2021 14:43:12 Greif, Jody Gravel ([id number]) -------------------------------------------------------------------------------- Progress Note Details Patient Name: Jody Lowe Date of Service: 04/20/2021 2:30 PM Medical Record Number: [id number] Patient Account Number: [id number] Date of Birth/Sex: Apr 15, 1945 (76 y.o. F) Treating RN: Hansel Feinstein Primary Care Provider: Jeffie Pollock Other Clinician: Referring Provider: Jeffie Pollock Treating Provider/Extender: Rowan Blase in Treatment: 7 Subjective Chief Complaint Information obtained from Patient Right Foot Ulcer History of Present Illness (HPI) 02/27/2021 upon evaluation today patient presents for inspection in our office today concerning issues that she has been having over the right medial first metatarsal head. This is actually a pressure ulcer that  I think developed as result of the patient actually passing out in her driveway and this incident occurred around the beginning of September 2022. She ended up October 10 having bypass surgery in regard to her leg to restore blood flow to the extremity and subsequently I think that this probably contributed to the wound development as with limited blood flow she would not have needed a whole lot of pressure getting to the area to cause a significant issue and passing out in the driveway could have definitely done that. She has been seeing Dr. Allena Katz and right now they have been using Betadine. Overall I think that we probably need to try something a little bit different to see if we can get this moving in a better direction. With that being said the patient does have a few other medical problems that would be detailed below. 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 I am pleased in that regard. She does have a history of hypertension, chronic kidney disease though I do not know what stage, and generalized muscle weakness. 03/09/2021 upon evaluation today patient appears to be doing about the same in regard to her foot ulcer. Notices the redness has not spread and things seem to be making progress here. Unfortunately she does still have some necrotic tissue in the center part of the wound she does have good arterial flow as noted by her arterial studies that we got back. Fortunately I think that we are good to proceed with some debridement here. I also think that she may be a candidate for hyperbaric oxygen therapy depending on how things go. 03/16/2021 upon evaluation today patient appears to be doing well with regard to her foot  ulcer. She has been tolerating the dressing changes without complication. Fortunately there is no signs of active infection at this time. No fevers, chills, nausea, vomiting, or diarrhea. 03/30/2021 upon evaluation today patient appears to be doing excellent in regard to her foot ulcer. I definitely think that she is showing signs of good improvement without any complication today. I do think the Iodoflex is doing a great job for her. 04/20/2021 upon evaluation today patient appears to be doing well with regard to her wound. Fortunately there does not  appear to be any signs of active infection at this time locally nor systemically. Overall I am actually pleased with where things stand she does have some callus around the edge of the wound but underneath there seems to be a lot of new skin which is great news. Objective Constitutional Well-nourished and well-hydrated in no acute distress. Vitals Time Taken: 2:50 PM, Height: 66 in, Weight: 199 lbs, BMI: 32.1, Temperature: 98.4 F, Pulse: 103 bpm, Respiratory Rate: 16 breaths/min, Blood Pressure: 99/66 mmHg. General Notes: stated saw PCP 04/13/21 about low BP and being monitored by [facility]; denies dizzy; education for safety for transition of position Respiratory normal breathing without difficulty. Psychiatric this patient is able to make decisions and demonstrates good insight into disease process. Alert and Oriented x 3. pleasant and cooperative. General Notes: Upon inspection patient's wound bed showed signs of good granulation and epithelization I did remove some of the necrotic debris today which she tolerated without complication and postdebridement the wound bed actually appears to be doing much better I think were getting very close to complete resolution. She is having some issues with swelling of her right leg in particular and some small little blisters nothing is legitimately open right now but nonetheless I think She probably does need some  compression socks ongoing. Tondreau, Jody Gravel ([id number]) Integumentary (Hair, Skin) Wound #1 status is Open. Original cause of wound was Blister. The date acquired was: 11/23/2020. The wound has been in treatment 7 weeks. The wound is located on the Right,Medial Metatarsal head first. The wound measures 0.3cm length x 0.3cm width x 0.4cm depth; 0.071cm^2 area and 0.028cm^3 volume. There is Fat Layer (Subcutaneous Tissue) exposed. There is no tunneling noted, however, there is undermining starting at 12:00 and ending at 12:00 with a maximum distance of 0.3cm. There is a medium amount of serosanguineous drainage noted. The wound margin is thickened. There is large (67-100%) pale granulation within the wound bed. There is a small (1-33%) amount of necrotic tissue within the wound bed including Eschar and Adherent Slough. Assessment Active Problems ICD-10 Other specified peripheral vascular diseases Pressure ulcer of other site, stage 3 Essential (primary) hypertension Chronic kidney disease, unspecified Muscle weakness (generalized) Procedures Wound #1 Pre-procedure diagnosis of Wound #1 is a Pressure Ulcer located on the Right,Medial Metatarsal head first .Severity of Tissue Pre Debridement is: Fat layer exposed. There was a Excisional Skin/Subcutaneous Tissue Debridement with a total area of 0.25 sq cm performed by Nelida Meuse., PA-C. With the following instrument(s): Curette to remove Viable and Non-Viable tissue/material. Material removed includes Subcutaneous Tissue, Slough, and Skin: Dermis after achieving pain control using Lidocaine. A time out was conducted at 15:05, prior to the start of the procedure. A Minimum amount of bleeding was controlled with Pressure. The procedure was tolerated well. Post Debridement Measurements: 0.3cm length x 0.3cm width x 0.3cm depth; 0.021cm^3 volume. Post debridement Stage noted as Category/Stage III. Character of Wound/Ulcer Post Debridement is  improved. Severity of Tissue Post Debridement is: Fat layer exposed. Post procedure Diagnosis Wound #1: Same as Pre-Procedure Plan Follow-up Appointments: Return Appointment in 2 weeks. Nurse Visit as needed Home Health: Home Health Company: - [phone] [facility] for wound care. May utilize formulary equivalent dressing for wound treatment orders unless otherwise specified. Home Health Nurse may visit PRN to address patient s wound care needs. - Wrap needs changed twice weekly- Scheduled days for dressing changes to be completed; exception, patient has scheduled wound care visit that day. **Please direct any  NON-WOUND related issues/requests for orders to patient's Primary Care Physician. **If current dressing causes regression in wound condition, may D/C ordered dressing product/s and apply Normal Saline Moist Dressing daily until next Wound Healing Center or Other MD appointment. **Notify Wound Healing Center of regression in wound condition at [phone]. Bathing/ Shower/ Hygiene: Wash wounds with antibacterial soap and water. - Keep dressing dry or change after shower May shower with wound dressing protected with water repellent cover or cast protector. No tub bath. Anesthetic (Use 'Patient Medications' Section for Anesthetic Order Entry): Lidocaine applied to wound bed Edema Control - Lymphedema / Segmental Compressive Device / Other: Elevate leg(s) parallel to the floor when sitting. DO YOUR BEST to sleep in the bed at night. DO NOT sleep in your recliner. Long hours of sitting in a recliner leads to swelling of the legs and/or potential wounds on your backside. Off-Loading: Open toe surgical shoe - Or alternative/keep pressure off of wound Turn and reposition every 2 hours Additional Orders / Instructions: Decrease/Stop Smoking Follow Nutritious Diet and Increase Protein Intake WOUND #1: - Metatarsal head first Wound Laterality: Right, Medial Steenson, Keerstin M.  ([id number]) Cleanser: Normal Saline 2 x Per Week/15 Days Discharge Instructions: Wash your hands with soap and water. Remove old dressing, discard into plastic bag and place into trash. Cleanse the wound with Normal Saline prior to applying a clean dressing using gauze sponges, not tissues or cotton balls. Do not scrub or use excessive force. Pat dry using gauze sponges, not tissue or cotton balls. Cleanser: Soap and Water 2 x Per Week/15 Days Discharge Instructions: Gently cleanse wound with antibacterial soap, rinse and pat dry prior to dressing wounds Primary Dressing: IODOFLEX 0.9% Cadexomer Iodine Pad 2 x Per Week/15 Days Discharge Instructions: Apply Iodoflex to wound bed only as directed. Secondary Dressing: Zetuvit Plus Silicone Non-bordered 5x5 (in/in) 2 x Per Week/15 Days Discharge Instructions: or alternative absorbent dressing Compression Wrap: Profore Lite LF 3 Multilayer Compression Bandaging System 2 x Per Week/15 Days Discharge Instructions: Apply 3 multi-layer wrap as prescribed. 1. At this time we will get a continue with the Iodoflex since the patient seems to still be doing quite well I think this is appropriate. 2. I am also can recommend that we have the patient continue with the Zetuvit dressing to cover which I think will do well for her. 3. We are going to initiate a 3 layer compression wrap to help with edema control since she is having some issues here with swelling of the leg this may also help get the wound healed a little bit more quickly I think that would be appropriate both ways. We will see patient back for reevaluation in 2 weeks here in the clinic. If anything worsens or changes patient will contact our office for additional recommendations. Electronic Signature(s) Signed: 04/20/2021 3:14:43 PM By: Lenda Kelp PA-C Entered By: Lenda Kelp on 04/20/2021 15:14:43 Prins, Jody Gravel  ([id number]) -------------------------------------------------------------------------------- SuperBill Details Patient Name: Jody Lowe Date of Service: 04/20/2021 Medical Record Number: [id number] Patient Account Number: [id number] Date of Birth/Sex: 1945/07/13 (76 y.o. 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, stage 3 I10 Essential (primary) hypertension N18.9 Chronic kidney disease, unspecified M62.81 Muscle weakness (generalized) Facility Procedures CPT4 Code: 97673419 Description: 11042 - DEB SUBQ TISSUE 20 SQ  CM/< Modifier: Quantity: 1 CPT4 Code: Description: ICD-10 Diagnosis Description 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

## 2021-04-20 NOTE — Progress Notes (Addendum)
Jody Lowe, Jody Lowe ([id number]) Visit Report for 04/20/2021 Arrival Information Details Patient Name: Jody Lowe, Jody Lowe. Date of Service: 04/20/2021 2:30 PM Medical Record Number: [id number] Patient Account Number: [id number] Date of Birth/Sex: Sep 10, 1945 (75 y.o. F) Treating RN: Hansel Feinstein Primary Care Marven Veley: Jeffie Pollock Other Clinician: Referring Kynadi Dragos: Jeffie Pollock Treating Anneka Studer/Extender: Rowan Blase in Treatment: 7 Visit Information History Since Last Visit Added or deleted any medications: No Patient Arrived: Wheel Chair Had a fall or experienced change in No Arrival Time: 14:42 activities of daily living that may affect Accompanied By: self risk of falls: Transfer Assistance: None Hospitalized since last visit: No Patient Identification Verified: Yes Has Dressing in Place as Prescribed: Yes Secondary Verification Process Completed: Yes Pain Present Now: No Patient Requires Transmission-Based No Precautions: Patient Has Alerts: Yes Patient Alerts: Patient on Blood Thinner Eliquis/ASA 81mg  AVVS ABI-N/C Nov '22 TBI-L 0.80; R 0.69 NOT diabetic Electronic Signature(s) Signed: 04/20/2021 4:05:14 PM By: 04/22/2021 Entered By: Hansel Feinstein on 04/20/2021 14:51:15 Finkel, 04/22/2021 ([name]) -------------------------------------------------------------------------------- Encounter Discharge Information Details Patient Name: [id number] Date of Service: 04/20/2021 2:30 PM Medical Record Number: 04/22/2021 Patient Account Number: [id number] Date of Birth/Sex: 1945-06-13 (75 y.o. F) Treating RN: 11-30-1994 Primary Care Latressa Harries: Hansel Feinstein Other Clinician: Referring Ryane Canavan: Jeffie Pollock Treating Kalyan Barabas/Extender: Jeffie Pollock in Treatment: 7 Encounter Discharge Information Items Post Procedure Vitals Discharge Condition: Stable Temperature (F): 98.4 Ambulatory Status: Wheelchair Pulse (bpm): 100 Discharge  Destination: Home Respiratory Rate (breaths/min): 16 Transportation: Private Auto Blood Pressure (mmHg): 99/66 Accompanied By: self Schedule Follow-up Appointment: Yes Clinical Summary of Care: Electronic Signature(s) Signed: 04/20/2021 4:05:14 PM By: 04/22/2021 Entered By: Hansel Feinstein on 04/20/2021 15:28:16 Aldrete, 04/22/2021 ([name]) -------------------------------------------------------------------------------- Lower Extremity Assessment Details Patient Name: [id number] Date of Service: 04/20/2021 2:30 PM Medical Record Number: 04/22/2021 Patient Account Number: [id number] Date of Birth/Sex: Nov 28, 1945 (75 y.o. F) Treating RN: 11-30[birth year]1992 Primary Care Drequan Ironside: Hansel Feinstein Other Clinician: Referring Cindi Ghazarian: Jeffie Pollock Treating Hortense Cantrall/Extender: Jeffie Pollock in Treatment: 7 Edema Assessment Assessed: [Left: No] [Right: Yes] Edema: [Left: Ye] [Right: s] Calf Left: Right: Point of Measurement: 36 cm From Medial Instep 44 cm Ankle Left: Right: Point of Measurement: 10 cm From Medial Instep 25 cm Knee To Floor Left: Right: From Medial Instep 41 cm Electronic Signature(s) Signed: 04/20/2021 4:05:14 PM By: 04/22/2021 Entered ByHansel Feinstein on 04/20/2021 15:09:49 Pitera, 04/22/2021 ([name]) -------------------------------------------------------------------------------- Multi Wound Chart Details Patient Name: [id number] Date of Service: 04/20/2021 2:30 PM Medical Record Number: 04/22/2021 Patient Account Number: [id number] Date of Birth/Sex: [birth year] (75 y.o. F) Treating RN: 11-30-1994 Primary Care Rishawn Walck: Hansel Feinstein Other Clinician: Referring Mikias Lanz: Jeffie Pollock Treating Marjorie Deprey/Extender: Jeffie Pollock in Treatment: 7 Vital Signs Height(in): 66 Pulse(bpm): 103 Weight(lbs): 199 Blood Pressure(mmHg): 99/66 Body Mass Index(BMI): 32.1 Temperature(F): 98.4 Respiratory Rate(breaths/min): 16 Photos:  [N/A:N/A] Wound Location: Right, Medial Metatarsal head first N/A N/A Wounding Event: Blister N/A N/A Primary Etiology: Pressure Ulcer N/A N/A Secondary Etiology: Arterial Insufficiency Ulcer N/A N/A Comorbid History: Cataracts, Anemia, Asthma, N/A N/A Coronary Artery Disease, Deep Vein Thrombosis, Hypertension, Gout, Osteoarthritis Date Acquired: 11/23/2020 N/A N/A Weeks of Treatment: 7 N/A N/A Wound Status: Open N/A N/A Wound Recurrence: No N/A N/A Measurements L x W x D (cm) 0.3x0.3x0.4 N/A N/A Area (cm) : 0.071 N/A N/A Volume (cm) : 0.028 N/A N/A % Reduction in Area: 43.70% N/A N/A % Reduction in Volume: 55.60% N/A N/A Starting Position 1 (o'clock): 12 Ending Position  1 (o'clock): 12 Maximum Distance 1 (cm): 0.3 Undermining: Yes N/A N/A Classification: Category/Stage III N/A N/A Exudate Amount: Medium N/A N/A Exudate Type: Serosanguineous N/A N/A Exudate Color: red, brown N/A N/A Wound Margin: Thickened N/A N/A Granulation Amount: Large (67-100%) N/A N/A Granulation Quality: Pale N/A N/A Necrotic Amount: Small (1-33%) N/A N/A Necrotic Tissue: Eschar, Adherent Slough N/A N/A Exposed Structures: Fat Layer (Subcutaneous Tissue): N/A N/A Yes Epithelialization: None N/A N/A Treatment Notes Electronic Signature(s) Signed: 04/20/2021 4:05:14 PM By: Hansel FeinsteinBishop, Joy Entered By: Hansel FeinsteinBishop, Joy on 04/20/2021 15:06:15 Masaki, Daine GravelSHIRLEY M. ([id number]) Wan, Daine GravelSHIRLEY M. ([id number]) -------------------------------------------------------------------------------- Multi-Disciplinary Care Plan Details Patient Name: [name], [name] Date of Service: 04/20/2021 2:30 PM Medical Record Number: [id number] Patient Account Number: [id number] Date of Birth/Sex: [birth year] (75 y.o. F) Treating RN: Hansel FeinsteinBishop, Joy Primary Care Keyron Pokorski: Jeffie PollockSaint-Surin, Tamara Other Clinician: Referring Jelissa Espiritu: Jeffie PollockSaint-Surin, Tamara Treating Quinette Hentges/Extender: Rowan BlaseStone, Hoyt Weeks in Treatment: 7 Active Inactive Electronic  Signature(s) Signed: 05/25/2021 11:39:48 AM By: Elliot GurneyWoody, BSN, RN, CWS, Kim RN, BSN Signed: 05/30/2021 12:56:32 PM By: Hansel FeinsteinBishop, Joy Previous Signature: 04/20/2021 4:05:14 PM Version By: Hansel FeinsteinBishop, Joy Entered By: Elliot GurneyWoody, BSN, RN, CWS, Kim on 05/25/2021 11:39:48 Askren, Daine GravelSHIRLEY M. ([id number]) -------------------------------------------------------------------------------- Pain Assessment Details Patient Name: [name], Mildreth M. Date of Service: 04/20/2021 2:30 PM Medical Record Number: [id number] Patient Account Number: [id number] Date of Birth/Sex: [birth year] (75 y.o. F) Treating RN: Hansel FeinsteinBishop, Joy Primary Care Breeana Sawtelle: Jeffie PollockSaint-Surin, Tamara Other Clinician: Referring Tameeka Luo: Jeffie PollockSaint-Surin, Tamara Treating Tashina Credit/Extender: Rowan BlaseStone, Hoyt Weeks in Treatment: 7 Active Problems Location of Pain Severity and Description of Pain Patient Has Paino No Site Locations Rate the pain. Current Pain Level: 0 Pain Management and Medication Current Pain Management: Electronic Signature(s) Signed: 04/20/2021 4:05:14 PM By: Hansel FeinsteinBishop, Joy Entered By: Hansel FeinsteinBishop, Joy on 04/20/2021 14:53:10 Cowans, Daine GravelSHIRLEY M. ([id number]) -------------------------------------------------------------------------------- Patient/Caregiver Education Details Patient Name: [name], Yazmyn M. Date of Service: 04/20/2021 2:30 PM Medical Record Number: [id number] Patient Account Number: [id number] Date of Birth/Gender: [birth year] (75 y.o. F) Treating RN: Hansel FeinsteinBishop, Joy Primary Care Physician: Jeffie PollockSaint-Surin, Tamara Other Clinician: Referring Physician: Jeffie PollockSaint-Surin, Tamara Treating Physician/Extender: Rowan BlaseStone, Hoyt Weeks in Treatment: 7 Education Assessment Education Provided To: Patient Education Topics Provided Basic Hygiene: Smoking and Wound Healing: Venous: Wound/Skin Impairment: Electronic Signature(s) Signed: 04/20/2021 4:05:14 PM By: Hansel FeinsteinBishop, Joy Entered By: Hansel FeinsteinBishop, Joy on 04/20/2021 15:14:31 Machia, Daine GravelSHIRLEY M.  ([id number]) -------------------------------------------------------------------------------- Wound Assessment Details Patient Name: [name], Marieelena M. Date of Service: 04/20/2021 2:30 PM Medical Record Number: [id number] Patient Account Number: [id number] Date of Birth/Sex: [birth year] (75 y.o. F) Treating RN: Hansel FeinsteinBishop, Joy Primary Care Jahid Weida: Jeffie PollockSaint-Surin, Tamara Other Clinician: Referring Christina Gintz: Jeffie PollockSaint-Surin, Tamara Treating Lekesha Claw/Extender: Rowan BlaseStone, Hoyt Weeks in Treatment: 7 Wound Status Wound Number: 1 Primary Pressure Ulcer Etiology: Wound Location: Right, Medial Metatarsal head first Secondary Arterial Insufficiency Ulcer Wounding Event: Blister Etiology: Date Acquired: 11/23/2020 Wound Open Weeks Of Treatment: 7 Status: Clustered Wound: No Comorbid Cataracts, Anemia, Asthma, Coronary Artery Disease, History: Deep Vein Thrombosis, Hypertension, Gout, Osteoarthritis Photos Wound Measurements Length: (cm) 0.3 Width: (cm) 0.3 Depth: (cm) 0.4 Area: (cm) 0.071 Volume: (cm) 0.028 % Reduction in Area: 43.7% % Reduction in Volume: 55.6% Epithelialization: None Tunneling: No Undermining: Yes Starting Position (o'clock): 12 Ending Position (o'clock): 12 Maximum Distance: (cm) 0.3 Wound Description Classification: Category/Stage III Wound Margin: Thickened Exudate Amount: Medium Exudate Type: Serosanguineous Exudate Color: red, brown Foul Odor After Cleansing: No Slough/Fibrino Yes Wound Bed Granulation Amount: Large (67-100%) Exposed Structure Granulation Quality: Pale Fat Layer (Subcutaneous Tissue) Exposed: Yes Necrotic Amount: Small (1-33%) Necrotic Quality: Eschar, Adherent Scientist, physiologicallough Electronic Signature(s)  Signed: 04/20/2021 4:05:14 PM By: Hansel Feinstein Entered By: Hansel Feinstein on 04/20/2021 14:57:51 Speich, [name] ([id number]) -------------------------------------------------------------------------------- Vitals Details Patient Name: [name] Date of  Service: 04/20/2021 2:30 PM Medical Record Number: [id number] Patient Account Number: [id number] Date of Birth/Sex: [birth year] (75 y.o. F) Treating RN: Hansel Feinstein Primary Care Tacori Kvamme: Jeffie Pollock Other Clinician: Referring Akiba Melfi: Jeffie Pollock Treating Brandy Zuba/Extender: Rowan Blase in Treatment: 7 Vital Signs Time Taken: 14:50 Temperature (F): 98.4 Height (in): 66 Pulse (bpm): 103 Weight (lbs): 199 Respiratory Rate (breaths/min): 16 Body Mass Index (BMI): 32.1 Blood Pressure (mmHg): 99/66 Reference Range: 80 - 120 mg / dl Notes stated saw PCP 1/63/84 about low BP and being monitored by [facility]; denies dizzy; education for safety for transition of position Electronic Signature(s) Signed: 04/20/2021 4:05:14 PM By: Hansel Feinstein Entered ByHansel Feinstein on 04/20/2021 14:52:59

## 2021-05-04 ENCOUNTER — Ambulatory Visit: Payer: Medicare Other | Admitting: Physician Assistant

## 2021-05-05 ENCOUNTER — Encounter: Payer: Self-pay | Admitting: Emergency Medicine

## 2021-05-05 ENCOUNTER — Other Ambulatory Visit: Payer: Self-pay

## 2021-05-05 ENCOUNTER — Inpatient Hospital Stay
Admission: EM | Admit: 2021-05-05 | Discharge: 2021-05-12 | DRG: 378 | Disposition: A | Discharge status: Skilled Nursing Facility | Payer: Medicare Other | Attending: Student | Admitting: Student

## 2021-05-05 ENCOUNTER — Emergency Department: Payer: Medicare Other

## 2021-05-05 DIAGNOSIS — F17210 Nicotine dependence, cigarettes, uncomplicated: Secondary | ICD-10-CM | POA: Diagnosis present

## 2021-05-05 DIAGNOSIS — Z7982 Long term (current) use of aspirin: Secondary | ICD-10-CM

## 2021-05-05 DIAGNOSIS — K921 Melena: Secondary | ICD-10-CM | POA: Diagnosis present

## 2021-05-05 DIAGNOSIS — Z7983 Long term (current) use of bisphosphonates: Secondary | ICD-10-CM

## 2021-05-05 DIAGNOSIS — I480 Paroxysmal atrial fibrillation: Secondary | ICD-10-CM | POA: Diagnosis present

## 2021-05-05 DIAGNOSIS — G8929 Other chronic pain: Secondary | ICD-10-CM | POA: Diagnosis present

## 2021-05-05 DIAGNOSIS — N179 Acute kidney failure, unspecified: Secondary | ICD-10-CM | POA: Diagnosis present

## 2021-05-05 DIAGNOSIS — E538 Deficiency of other specified B group vitamins: Secondary | ICD-10-CM | POA: Diagnosis present

## 2021-05-05 DIAGNOSIS — D75838 Other thrombocytosis: Secondary | ICD-10-CM | POA: Diagnosis present

## 2021-05-05 DIAGNOSIS — F17200 Nicotine dependence, unspecified, uncomplicated: Secondary | ICD-10-CM | POA: Diagnosis not present

## 2021-05-05 DIAGNOSIS — E669 Obesity, unspecified: Secondary | ICD-10-CM | POA: Diagnosis present

## 2021-05-05 DIAGNOSIS — I340 Nonrheumatic mitral (valve) insufficiency: Secondary | ICD-10-CM | POA: Diagnosis not present

## 2021-05-05 DIAGNOSIS — R0600 Dyspnea, unspecified: Secondary | ICD-10-CM | POA: Diagnosis not present

## 2021-05-05 DIAGNOSIS — Z9049 Acquired absence of other specified parts of digestive tract: Secondary | ICD-10-CM | POA: Diagnosis not present

## 2021-05-05 DIAGNOSIS — R7989 Other specified abnormal findings of blood chemistry: Secondary | ICD-10-CM

## 2021-05-05 DIAGNOSIS — Z5309 Procedure and treatment not carried out because of other contraindication: Secondary | ICD-10-CM | POA: Diagnosis not present

## 2021-05-05 DIAGNOSIS — M25561 Pain in right knee: Secondary | ICD-10-CM | POA: Diagnosis not present

## 2021-05-05 DIAGNOSIS — R000 Tachycardia, unspecified: Secondary | ICD-10-CM | POA: Diagnosis not present

## 2021-05-05 DIAGNOSIS — K922 Gastrointestinal hemorrhage, unspecified: Secondary | ICD-10-CM | POA: Diagnosis not present

## 2021-05-05 DIAGNOSIS — Z20822 Contact with and (suspected) exposure to covid-19: Secondary | ICD-10-CM | POA: Diagnosis present

## 2021-05-05 DIAGNOSIS — R778 Other specified abnormalities of plasma proteins: Secondary | ICD-10-CM | POA: Diagnosis present

## 2021-05-05 DIAGNOSIS — D631 Anemia in chronic kidney disease: Secondary | ICD-10-CM | POA: Diagnosis present

## 2021-05-05 DIAGNOSIS — D75839 Thrombocytosis, unspecified: Secondary | ICD-10-CM | POA: Diagnosis present

## 2021-05-05 DIAGNOSIS — E876 Hypokalemia: Secondary | ICD-10-CM | POA: Diagnosis present

## 2021-05-05 DIAGNOSIS — D509 Iron deficiency anemia, unspecified: Secondary | ICD-10-CM | POA: Diagnosis not present

## 2021-05-05 DIAGNOSIS — D72828 Other elevated white blood cell count: Secondary | ICD-10-CM | POA: Diagnosis present

## 2021-05-05 DIAGNOSIS — R531 Weakness: Secondary | ICD-10-CM | POA: Diagnosis not present

## 2021-05-05 DIAGNOSIS — I10 Essential (primary) hypertension: Secondary | ICD-10-CM | POA: Diagnosis not present

## 2021-05-05 DIAGNOSIS — Z8249 Family history of ischemic heart disease and other diseases of the circulatory system: Secondary | ICD-10-CM

## 2021-05-05 DIAGNOSIS — F32A Depression, unspecified: Secondary | ICD-10-CM | POA: Diagnosis present

## 2021-05-05 DIAGNOSIS — Z8673 Personal history of transient ischemic attack (TIA), and cerebral infarction without residual deficits: Secondary | ICD-10-CM

## 2021-05-05 DIAGNOSIS — N1831 Chronic kidney disease, stage 3a: Secondary | ICD-10-CM | POA: Diagnosis present

## 2021-05-05 DIAGNOSIS — D500 Iron deficiency anemia secondary to blood loss (chronic): Secondary | ICD-10-CM | POA: Diagnosis not present

## 2021-05-05 DIAGNOSIS — D649 Anemia, unspecified: Secondary | ICD-10-CM

## 2021-05-05 DIAGNOSIS — Z7901 Long term (current) use of anticoagulants: Secondary | ICD-10-CM

## 2021-05-05 DIAGNOSIS — I4891 Unspecified atrial fibrillation: Secondary | ICD-10-CM | POA: Diagnosis not present

## 2021-05-05 DIAGNOSIS — D62 Acute posthemorrhagic anemia: Secondary | ICD-10-CM | POA: Diagnosis present

## 2021-05-05 DIAGNOSIS — E785 Hyperlipidemia, unspecified: Secondary | ICD-10-CM | POA: Diagnosis present

## 2021-05-05 DIAGNOSIS — I129 Hypertensive chronic kidney disease with stage 1 through stage 4 chronic kidney disease, or unspecified chronic kidney disease: Secondary | ICD-10-CM | POA: Diagnosis present

## 2021-05-05 DIAGNOSIS — I4892 Unspecified atrial flutter: Secondary | ICD-10-CM | POA: Diagnosis not present

## 2021-05-05 DIAGNOSIS — D72829 Elevated white blood cell count, unspecified: Secondary | ICD-10-CM

## 2021-05-05 DIAGNOSIS — E8721 Acute metabolic acidosis: Secondary | ICD-10-CM | POA: Diagnosis present

## 2021-05-05 DIAGNOSIS — J811 Chronic pulmonary edema: Secondary | ICD-10-CM | POA: Diagnosis not present

## 2021-05-05 DIAGNOSIS — E871 Hypo-osmolality and hyponatremia: Secondary | ICD-10-CM | POA: Diagnosis present

## 2021-05-05 DIAGNOSIS — I739 Peripheral vascular disease, unspecified: Secondary | ICD-10-CM | POA: Diagnosis present

## 2021-05-05 DIAGNOSIS — K5521 Angiodysplasia of colon with hemorrhage: Secondary | ICD-10-CM | POA: Diagnosis present

## 2021-05-05 DIAGNOSIS — I429 Cardiomyopathy, unspecified: Secondary | ICD-10-CM | POA: Diagnosis present

## 2021-05-05 DIAGNOSIS — Z79899 Other long term (current) drug therapy: Secondary | ICD-10-CM

## 2021-05-05 DIAGNOSIS — R079 Chest pain, unspecified: Secondary | ICD-10-CM

## 2021-05-05 HISTORY — DX: Hyperlipidemia, unspecified: E78.5

## 2021-05-05 HISTORY — DX: Other forms of angina pectoris: I20.89

## 2021-05-05 HISTORY — DX: Unspecified atrial flutter: I48.92

## 2021-05-05 HISTORY — DX: Personal history of nicotine dependence: Z87.891

## 2021-05-05 HISTORY — DX: Deficiency of other specified B group vitamins: E53.8

## 2021-05-05 HISTORY — DX: Other forms of angina pectoris: I20.8

## 2021-05-05 HISTORY — DX: Gastrointestinal hemorrhage, unspecified: K92.2

## 2021-05-05 HISTORY — DX: Transient cerebral ischemic attack, unspecified: G45.9

## 2021-05-05 HISTORY — DX: Morbid (severe) obesity due to excess calories: E66.01

## 2021-05-05 HISTORY — DX: Cardiomyopathy, unspecified: I42.9

## 2021-05-05 HISTORY — DX: Nonrheumatic mitral (valve) insufficiency: I34.0

## 2021-05-05 HISTORY — DX: Iron deficiency anemia, unspecified: D50.9

## 2021-05-05 HISTORY — DX: Depression, unspecified: F32.A

## 2021-05-05 HISTORY — DX: Chronic kidney disease, stage 3 unspecified: N18.30

## 2021-05-05 LAB — COMPREHENSIVE METABOLIC PANEL
ALT: 33 U/L (ref 0–44)
AST: 48 U/L — ABNORMAL HIGH (ref 15–41)
Albumin: 3 g/dL — ABNORMAL LOW (ref 3.5–5.0)
Alkaline Phosphatase: 121 U/L (ref 38–126)
Anion gap: 13 (ref 5–15)
BUN: 27 mg/dL — ABNORMAL HIGH (ref 8–23)
CO2: 13 mmol/L — ABNORMAL LOW (ref 22–32)
Calcium: 8.7 mg/dL — ABNORMAL LOW (ref 8.9–10.3)
Chloride: 103 mmol/L (ref 98–111)
Creatinine, Ser: 1.6 mg/dL — ABNORMAL HIGH (ref 0.44–1.00)
GFR, Estimated: 33 mL/min — ABNORMAL LOW (ref >60)
Glucose, Bld: 118 mg/dL — ABNORMAL HIGH (ref 70–99)
Potassium: 3.9 mmol/L (ref 3.5–5.1)
Sodium: 129 mmol/L — ABNORMAL LOW (ref 135–145)
Total Bilirubin: 0.5 mg/dL (ref 0.3–1.2)
Total Protein: 7 g/dL (ref 6.5–8.1)

## 2021-05-05 LAB — CBC WITH DIFFERENTIAL/PLATELET
Abs Immature Granulocytes: 0.09 10*3/uL — ABNORMAL HIGH (ref 0.00–0.07)
Basophils Absolute: 0 10*3/uL (ref 0.0–0.1)
Basophils Relative: 0 %
Eosinophils Absolute: 0 10*3/uL (ref 0.0–0.5)
Eosinophils Relative: 0 %
HCT: 10.9 % — CL (ref 36.0–46.0)
Hemoglobin: 2.9 g/dL — CL (ref 12.0–15.0)
Immature Granulocytes: 1 %
Lymphocytes Relative: 9 %
Lymphs Abs: 0.8 10*3/uL (ref 0.7–4.0)
MCH: 18.1 pg — ABNORMAL LOW (ref 26.0–34.0)
MCHC: 26.6 g/dL — ABNORMAL LOW (ref 30.0–36.0)
MCV: 68.1 fL — ABNORMAL LOW (ref 80.0–100.0)
Monocytes Absolute: 0.8 10*3/uL (ref 0.1–1.0)
Monocytes Relative: 9 %
Neutro Abs: 7.4 10*3/uL (ref 1.7–7.7)
Neutrophils Relative %: 81 %
Platelets: 638 10*3/uL — ABNORMAL HIGH (ref 150–400)
RBC: 1.6 MIL/uL — ABNORMAL LOW (ref 3.87–5.11)
RDW: 17.8 % — ABNORMAL HIGH (ref 11.5–15.5)
Smear Review: NORMAL
WBC: 9 10*3/uL (ref 4.0–10.5)
nRBC: 2.5 % — ABNORMAL HIGH (ref 0.0–0.2)

## 2021-05-05 LAB — RESP PANEL BY RT-PCR (FLU A&B, COVID) ARPGX2
Influenza A by PCR: NEGATIVE
Influenza B by PCR: NEGATIVE
SARS Coronavirus 2 by RT PCR: NEGATIVE

## 2021-05-05 LAB — TROPONIN I (HIGH SENSITIVITY)
Troponin I (High Sensitivity): 475 ng/L (ref <18)
Troponin I (High Sensitivity): 396 ng/L (ref <18)

## 2021-05-05 LAB — MAGNESIUM: Magnesium: 1.9 mg/dL (ref 1.7–2.4)

## 2021-05-05 LAB — PHOSPHORUS: Phosphorus: 4.4 mg/dL (ref 2.5–4.6)

## 2021-05-05 MED ORDER — ATORVASTATIN CALCIUM 20 MG PO TABS: 20.0000 mg | ORAL_TABLET | Freq: Every day | ORAL | Status: DC

## 2021-05-05 MED ORDER — PANTOPRAZOLE INFUSION (NEW) - SIMPLE MED
8.0000 mg/h | INTRAVENOUS | Status: DC
  Administered 2021-05-05: 8 mg/h via INTRAVENOUS
  Filled 2021-05-05 (×3): qty 100

## 2021-05-05 MED ORDER — ACETAMINOPHEN 325 MG PO TABS
650.0000 mg | ORAL_TABLET | Freq: Four times a day (QID) | ORAL | Status: AC | PRN
  Administered 2021-05-06: 650 mg via ORAL
  Filled 2021-05-05: qty 2

## 2021-05-05 MED ORDER — FLUTICASONE PROPIONATE 50 MCG/ACT NA SUSP
1.0000 | Freq: Every day | NASAL | Status: DC | PRN
  Filled 2021-05-05: qty 16

## 2021-05-05 MED ORDER — PANTOPRAZOLE 80MG IVPB - SIMPLE MED
80.0000 mg | Freq: Once | INTRAVENOUS | Status: AC
  Administered 2021-05-05: 80 mg via INTRAVENOUS
  Filled 2021-05-05: qty 100

## 2021-05-05 MED ORDER — SENNOSIDES-DOCUSATE SODIUM 8.6-50 MG PO TABS: 2.0000 | ORAL_TABLET | Freq: Every day | ORAL | Status: DC | PRN

## 2021-05-05 MED ORDER — PANTOPRAZOLE SODIUM 40 MG IV SOLR
40.0000 mg | Freq: Two times a day (BID) | INTRAVENOUS | Status: DC
  Filled 2021-05-05: qty 10

## 2021-05-05 MED ORDER — ONDANSETRON HCL 4 MG/2ML IJ SOLN
4.0000 mg | Freq: Once | INTRAMUSCULAR | Status: AC
Start: 2021-05-05 — End: 2021-05-05
  Administered 2021-05-05: 4 mg via INTRAVENOUS
  Filled 2021-05-05: qty 2

## 2021-05-05 MED ORDER — OXYBUTYNIN CHLORIDE ER 10 MG PO TB24
10.0000 mg | ORAL_TABLET | Freq: Every day | ORAL | Status: DC
  Administered 2021-05-06 – 2021-05-12 (×6): 10 mg via ORAL
  Filled 2021-05-05 (×7): qty 1

## 2021-05-05 MED ORDER — MECLIZINE HCL 25 MG PO TABS
25.0000 mg | ORAL_TABLET | Freq: Three times a day (TID) | ORAL | Status: DC | PRN
  Filled 2021-05-05: qty 1

## 2021-05-05 MED ORDER — ACETAMINOPHEN 650 MG RE SUPP: 650.0000 mg | Freq: Four times a day (QID) | RECTAL | Status: AC | PRN

## 2021-05-05 MED ORDER — VITAMIN B-12 1000 MCG PO TABS
1000.0000 ug | ORAL_TABLET | ORAL | Status: DC
  Administered 2021-05-09: 1000 ug via ORAL
  Filled 2021-05-05 (×2): qty 1

## 2021-05-05 MED ORDER — ALBUTEROL SULFATE HFA 108 (90 BASE) MCG/ACT IN AERS: 1.0000 | INHALATION_SPRAY | Freq: Four times a day (QID) | RESPIRATORY_TRACT | Status: DC | PRN

## 2021-05-05 MED ORDER — POLYETHYLENE GLYCOL 3350 17 G PO PACK
17.0000 g | PACK | Freq: Every day | ORAL | Status: DC | PRN
  Administered 2021-05-07: 17 g via ORAL
  Filled 2021-05-05: qty 1

## 2021-05-05 MED ORDER — VITAMIN D 25 MCG (1000 UNIT) PO TABS
1000.0000 [IU] | ORAL_TABLET | Freq: Every day | ORAL | Status: DC
  Administered 2021-05-06 – 2021-05-12 (×6): 1000 [IU] via ORAL
  Filled 2021-05-05 (×6): qty 1

## 2021-05-05 MED ORDER — ALBUTEROL SULFATE (2.5 MG/3ML) 0.083% IN NEBU: 2.5000 mg | INHALATION_SOLUTION | Freq: Four times a day (QID) | RESPIRATORY_TRACT | Status: DC | PRN

## 2021-05-05 MED ORDER — ONDANSETRON HCL 4 MG/2ML IJ SOLN: 4.0000 mg | Freq: Four times a day (QID) | INTRAMUSCULAR | Status: DC | PRN

## 2021-05-05 MED ORDER — FLUOXETINE HCL 20 MG PO CAPS
80.0000 mg | ORAL_CAPSULE | Freq: Every day | ORAL | Status: DC
  Administered 2021-05-06 – 2021-05-12 (×6): 80 mg via ORAL
  Filled 2021-05-05 (×6): qty 4

## 2021-05-05 MED ORDER — SODIUM CHLORIDE 0.9 % IV SOLN: 10.0000 mL/h | Freq: Once | INTRAVENOUS | Status: DC

## 2021-05-05 MED ORDER — AMITRIPTYLINE HCL 25 MG PO TABS
25.0000 mg | ORAL_TABLET | Freq: Every day | ORAL | Status: DC
  Administered 2021-05-06 – 2021-05-11 (×6): 25 mg via ORAL
  Filled 2021-05-05: qty 0.5
  Filled 2021-05-05 (×3): qty 1
  Filled 2021-05-05: qty 0.5
  Filled 2021-05-05 (×2): qty 1
  Filled 2021-05-05: qty 0.5

## 2021-05-05 MED ORDER — ONDANSETRON HCL 4 MG PO TABS: 4.0000 mg | ORAL_TABLET | Freq: Four times a day (QID) | ORAL | Status: DC | PRN

## 2021-05-05 MED ORDER — DICLOFENAC SODIUM 1 % EX GEL
2.0000 g | Freq: Four times a day (QID) | CUTANEOUS | Status: DC | PRN
  Filled 2021-05-05: qty 100

## 2021-05-05 NOTE — Assessment & Plan Note (Signed)
-   Resumed home supplement dosing

## 2021-05-05 NOTE — Assessment & Plan Note (Addendum)
-   Acute on chronic severe anemia; hemoglobin of 2.9 on admission - EDP has ordered 2 units of PRBC - Called blood bank and patient is pending antibody evaluation - Presumed secondary to upper GI bleed - Protonix bolus and GGT ordered - GI service, Dr. Mia Creek has been consulted for EGD - Repeat CBC timed for 2300 on day of admission - Admit to stepdown, inpatient, telemetry

## 2021-05-05 NOTE — Assessment & Plan Note (Signed)
-   Voltaren cream as needed

## 2021-05-05 NOTE — Assessment & Plan Note (Signed)
-   Presume prerenal in setting of acute on chronic blood loss anemia - Transfused with packed red blood - Hemoglobin too low to give IV fluid - BMP in the a.m.

## 2021-05-05 NOTE — ED Provider Notes (Signed)
[facility] Provider Note  Patient Contact: 4:01 PM (approximate)   History   Anxiety   HPI  Jody Lowe is a 76 y.o. female with history of peripheral vascular disease anemia of chronic disease, hypertension and nicotine dependence, presents to the emergency department with mid central chest pressure and shortness of breath.  Patient states that her chest pain has occurred for the past month and is nonradiating but states that her shortness of breath worsened today.  Patient is accompanied by her son who states that her symptoms started approximately 4 days after she had a viral URI.  Patient denies fever and chills at home.  No vomiting or diarrhea.  Patient is currently anticoagulated with Eliquis.  She states that she has not taken her medication today but states that she has been compliant in the past.      Physical Exam   Triage Vital Signs: ED Triage Vitals  Enc Vitals Group     BP 05/05/21 1545 (!) 115/51     Pulse Rate 05/05/21 1545 93     Resp 05/05/21 1545 18     Temp 05/05/21 1545 97.7 F (36.5 C)     Temp Source 05/05/21 1545 Oral     SpO2 05/05/21 1545 100 %     Weight 05/05/21 1547 200 lb (90.7 kg)     Height 05/05/21 1547 5\' 6"  (1.676 m)     Head Circumference --      Peak Flow --      Pain Score 05/05/21 1547 0     Pain Loc --      Pain Edu? --      Excl. in GC? --     Most recent vital signs: Vitals:   05/05/21 1900 05/05/21 1915  BP: 130/80   Pulse:    Resp:  19  Temp:    SpO2:       General: Alert and in no acute distress. Eyes:  PERRL. EOMI. Head: No acute traumatic findings ENT:      Nose: No congestion/rhinnorhea.      Mouth/Throat: Mucous membranes are moist. Neck: No stridor. No cervical spine tenderness to palpation. Cardiovascular:  Good peripheral perfusion Respiratory: Normal respiratory effort without tachypnea or retractions. Lungs CTAB. Good air entry to the bases with no decreased or absent breath  sounds. Gastrointestinal: Bowel sounds 4 quadrants. Soft and nontender to palpation. No guarding or rigidity. No palpable masses. No distention. No CVA tenderness. Musculoskeletal: Full range of motion to all extremities.  Neurologic:  No gross focal neurologic deficits are appreciated.  Skin:   No rash noted   ED Results / Procedures / Treatments   Labs (all labs ordered are listed, but only abnormal results are displayed) Labs Reviewed  CBC WITH DIFFERENTIAL/PLATELET - Abnormal; Notable for the following components:      Result Value   RBC 1.60 (*)    Hemoglobin 2.9 (*)    HCT 10.9 (*)    MCV 68.1 (*)    MCH 18.1 (*)    MCHC 26.6 (*)    RDW 17.8 (*)    Platelets 638 (*)    nRBC 2.5 (*)    Abs Immature Granulocytes 0.09 (*)    All other components within normal limits  COMPREHENSIVE METABOLIC PANEL - Abnormal; Notable for the following components:   Sodium 129 (*)    CO2 13 (*)    Glucose, Bld 118 (*)    BUN 27 (*)  Creatinine, Ser 1.60 (*)    Calcium 8.7 (*)    Albumin 3.0 (*)    AST 48 (*)    GFR, Estimated 33 (*)    All other components within normal limits  TROPONIN I (HIGH SENSITIVITY) - Abnormal; Notable for the following components:   Troponin I (High Sensitivity) 475 (*)    All other components within normal limits  RESP PANEL BY RT-PCR (FLU A&B, COVID) ARPGX2  PHOSPHORUS  MAGNESIUM  PATHOLOGIST SMEAR REVIEW  CBC  H PYLORI, IGM, IGG, IGA AB  COMPREHENSIVE METABOLIC PANEL  TYPE AND SCREEN  PREPARE RBC (CROSSMATCH)  TYPE AND SCREEN  TROPONIN I (HIGH SENSITIVITY)     EKG  Ventricular rate 100. ST segment depression.  Normal sinus rhythm without ST segment elevation.   RADIOLOGY  I personally viewed and evaluated these images as part of my medical decision making, as well as reviewing the written report by the radiologist.  ED Provider Interpretation: I personally reviewed chest x-ray.  Reticular opacities visualized  bilaterally.   PROCEDURES:  Critical Care performed: Yes, see critical care procedure note(s)  .Critical Care Performed by: Orvil Feil, PA-C Authorized by: Orvil Feil, PA-C   Critical care provider statement:    Critical care time (minutes):  60   Critical care was necessary to treat or prevent imminent or life-threatening deterioration of the following conditions:  Circulatory failure   Critical care was time spent personally by me on the following activities:  Blood draw for specimens, development of treatment plan with patient or surrogate, discussions with consultants, discussions with primary provider, evaluation of patient's response to treatment, examination of patient, interpretation of cardiac output measurements, ordering and performing treatments and interventions, ordering and review of laboratory studies, ordering and review of radiographic studies, re-evaluation of patient's condition and review of old charts   Care discussed with: admitting provider     MEDICATIONS ORDERED IN ED: Medications  0.9 %  sodium chloride infusion (has no administration in time range)  ondansetron (ZOFRAN) injection 4 mg (4 mg Intravenous Incomplete 05/05/21 1807)  atorvastatin (LIPITOR) tablet 20 mg (has no administration in time range)  acetaminophen (TYLENOL) tablet 650 mg (has no administration in time range)    Or  acetaminophen (TYLENOL) suppository 650 mg (has no administration in time range)  ondansetron (ZOFRAN) tablet 4 mg (has no administration in time range)    Or  ondansetron (ZOFRAN) injection 4 mg (has no administration in time range)  pantoprazole (PROTONIX) 80 mg /NS 100 mL IVPB (80 mg Intravenous New Bag/Given 05/05/21 1910)  pantoprozole (PROTONIX) 80 mg /NS 100 mL infusion (has no administration in time range)  pantoprazole (PROTONIX) injection 40 mg (has no administration in time range)     IMPRESSION / MDM / ASSESSMENT AND PLAN / ED COURSE  I reviewed the  triage vital signs and the nursing notes.                              Differential diagnosis includes, but is not limited to, STEMI, PE, community-acquired pneumonia, symptomatic anemia...  Assessment and Plan: Chest pain:  Symptomatic anemia AKI 76 year old female presents to the emergency department with mid central chest pain described as heaviness with associated shortness of breath for the past month with worsening shortness of breath today.  Patient was mildly hypotensive at triage but vital signs were otherwise reassuring.  She was alert, active and nontoxic-appearing was able to  provide historical information.  Patient's hemoglobin returned at 2.9.  Initial troponin elevated at 475.  Last troponin was 412 6 months ago.  Patient has global ST segment depression on EKG without other arrhythmia.    Patient's creatinine also elevated at 1.6 compared to patient's labs obtained 3 months ago.  Consent was given to receive blood products and 4 units of packed red blood cells were ordered to be transfused.  Patient was accepted for admission under the care of Dr. Sedalia Muta.   FINAL CLINICAL IMPRESSION(S) / ED DIAGNOSES   Final diagnoses:  Chest pain, unspecified type  Symptomatic anemia  AKI (acute kidney injury) (HCC)     Rx / DC Orders   ED Discharge Orders     None        Note:  This document was prepared using Dragon voice recognition software and may include unintentional dictation errors.   Pia Mau [place], Cordelia Poche 05/05/21 1917    Chesley Noon, MD 05/05/21 1958

## 2021-05-05 NOTE — Hospital Course (Addendum)
Jody Lowe is a 76 year old female with history of iron deficiency anemia, previously on iron tablets, B12 deficiency anemia, generalized weakness, hyperlipidemia, depressive disorder, hypertension, tobacco use disorder, PAD, obesity, history of TIA, CKD 3A, who presents to the emergency department for chief concerns of "having an anxiety attack".  Initial vitals in the emergency department show temperature of 97.7, respiration rate of 18, heart rate of 93, blood pressure 115/51, SPO2 100% on room air.  Serum sodium 129, potassium 3.9, chloride of 103, bicarb of 13, BUN of 27, serum creatinine of 1.60, GFR 30, nonfasting blood glucose 118.  WBC was 9, hemoglobin was 2.9, hematocrit 10.9, platelets of 638.  High-sensitivity troponin was elevated at 475.  COVID/influenza A/influenza B PCR were pending.  ED treatment: 2 units of packed red blood cell for transfusion; ondansetron 4 mg IV.

## 2021-05-05 NOTE — Assessment & Plan Note (Signed)
-   Currently low normotensive and normotensive at this time.

## 2021-05-05 NOTE — ED Notes (Signed)
Per charge RN, report to CCU was attempted- CCU refused report at this time do to unit events

## 2021-05-05 NOTE — ED Notes (Signed)
Blood bank's ETA for blood units is approximately 2040.

## 2021-05-05 NOTE — Assessment & Plan Note (Signed)
-   Presumed demand ischemia in the setting of acute on chronic blood loss anemia - Continue to follow - Treat with blood transfusion as above

## 2021-05-05 NOTE — ED Notes (Signed)
Attempted to give report to CCU- extension given for call-back.

## 2021-05-05 NOTE — ED Notes (Signed)
Pt given pillow and ice chips. Repositioned. Purewick applied.

## 2021-05-05 NOTE — Assessment & Plan Note (Signed)
-   Atorvastatin 20 mg nightly resumed 

## 2021-05-05 NOTE — ED Triage Notes (Signed)
Pt to ER via EMS with reports of "having an anxiety attack."

## 2021-05-05 NOTE — H&P (Addendum)
History and Physical   Jody Lowe [id number] DOB: April 04, 1945 DOA: 05/05/2021  PCP: Tomasita Morrow, MD  Outpatient Specialists: Dr. Lucky Cowboy Patient coming from: home via EMS  I have personally briefly reviewed patient's old medical records in [place].  Chief Concern: Anxiety attack  HPI: Jody Lowe is a 76 year old female with history of iron deficiency anemia, previously on iron tablets, B12 deficiency anemia, generalized weakness, hyperlipidemia, depressive disorder, hypertension, tobacco use disorder, PAD, obesity, history of TIA, CKD 3A, who presents to the emergency department for chief concerns of "having an anxiety attack".  Initial vitals in the emergency department show temperature of 97.7, respiration rate of 18, heart rate of 93, blood pressure 115/51, SPO2 100% on room air.  Serum sodium 129, potassium 3.9, chloride of 103, bicarb of 13, BUN of 27, serum creatinine of 1.60, GFR 30, nonfasting blood glucose 118.  WBC was 9, hemoglobin was 2.9, hematocrit 10.9, platelets of 638.  High-sensitivity troponin was elevated at 475.  COVID/influenza A/influenza B PCR were pending.  ED treatment: 2 units of packed red blood cell for transfusion; ondansetron 4 mg IV. ________________  At bedside, patient was able to tell me her full name, her age, the current calendar year and she knows she is in the hospital.  She is able to identify her son at bedside.  She reports that she has been having anxiety and shortness of breath for about 1 week.  She attributes this to her history of panic attacks.    She denies chest pain, diarrhea, dysuria, syncope, loss of consciousness she endorses chronic bilateral lower extremity swelling in her legs that is unchanged from baseline.  She endorses black melena stool for several years.  She endorses that previously she took iron tablets daily however has not been able to take it in the last 2 to 3 weeks.  She states that her  prescription for the iron tablets ran out, and she has called the pharmacy and her outpatient provider however has not been able to fill the iron tablets.  She denies taking NSAIDs including Motrin, naproxen, ibuprofen, BC powder, Goody powder.  She denies EtOH use.  She further endorses nausea and vomiting 2 or 3 times on 05/04/2021.  She states they were clear phlegm.  She denies black or blood vomitus.  Vaccination history: She is vaccinated for COVID-19 with at least 2 doses of Moderna (05/06/2019 and 06/03/2019)  ROS: Constitutional: no weight change, no fever ENT/Mouth: no sore throat, no rhinorrhea Eyes: no eye pain, no vision changes Cardiovascular: no chest pain, + dyspnea,  no edema, no palpitations Respiratory: no cough, no sputum, no wheezing Gastrointestinal: + nausea, + vomiting, no diarrhea, no constipation Genitourinary: no urinary incontinence, no dysuria, no hematuria Musculoskeletal: no arthralgias, no myalgias Skin: no skin lesions, no pruritus, Neuro: + weakness, no loss of consciousness, no syncope Psych: no anxiety, no depression, + decrease appetite Heme/Lymph: no bruising, no bleeding  ED Course: Discussed with emergency medicine provider, patient requiring hospitalization for chief concerns of severe acute anemia.  Assessment/Plan  Principal Problem:   Acute anemia Active Problems:   Tobacco use disorder   PAD (peripheral artery disease) (HCC)   Iron deficiency anemia due to chronic blood loss   Hypertension, benign   Depressive disorder   AKI (acute kidney injury) ([place])   Elevated troponin   Chronic pain of both knees   B12 deficiency   Weakness   Generalized weakness   Hyperlipidemia   Obesity   *  Acute anemia Assessment & Plan - Acute on chronic severe anemia; hemoglobin of 2.9 on admission - Hemoglobin was 5.4 on 04/13/2021 outpatient - EDP has ordered 2 units of PRBC - Called blood bank and patient is pending antibody evaluation - Presumed  secondary to upper GI bleed - Protonix bolus and GGT ordered - GI service, Dr. Haig Prophet has been consulted for EGD - Repeat CBC timed for 2300 on day of admission - Check H. pylori antibody and antigen - Maintain 2 large-bore IV needles - Admit to stepdown, inpatient, telemetry  Hypertension, benign Assessment & Plan - Currently low normotensive and normotensive at this time.  AKI (acute kidney injury) ([place]) Assessment & Plan - Presume prerenal in setting of acute on chronic blood loss anemia - Transfused with packed red blood - Hemoglobin too low to give IV fluid - BMP in the a.m.  Hyperlipidemia Assessment & Plan - Atorvastatin 20 mg nightly resumed  B12 deficiency Assessment & Plan - Resumed home supplement dosing  Chronic pain of both knees Assessment & Plan - Voltaren cream as needed  Elevated troponin Assessment & Plan - Presumed demand ischemia in the setting of acute on chronic blood loss anemia - Continue to follow - Treat with blood transfusion as above  Pending med reconciliation/AM team to complete med reconciliation  DVT prophylaxis-pharmacologic DVT prophylaxis has not been started by myself due to severe acute on chronic anemia requiring blood transfusion  Chart reviewed.   DVT prophylaxis: TED hose Code Status: Full code Diet: N.p.o. except for sips for meds and ice chips. Family Communication: Updated son at bedside. Disposition Plan: Pending clinical course, anticipate greater than 2 night stay Consults called: GI service Admission status: Stepdown, inpatient, telemetry  Past Medical History:  Diagnosis Date   Anemia    Hypertension    Peripheral artery disease ([place])    Renal disorder    Past Surgical History:  Procedure Laterality Date   BREAST REDUCTION SURGERY     CARDIAC CATHETERIZATION     CATARACT EXTRACTION W/PHACO Left 11/11/2019   Procedure: CATARACT EXTRACTION PHACO AND INTRAOCULAR LENS PLACEMENT ([place]) LEFT VISION BLUE;   Surgeon: Marchia Meiers, MD;  Location: [facility];  Service: Ophthalmology;  Laterality: Left;  7.94 0:42.4   CATARACT EXTRACTION W/PHACO Right 12/23/2019   Procedure: CATARACT EXTRACTION PHACO AND INTRAOCULAR LENS PLACEMENT ([place]) RIGHT VISION BLUE 5.47  00:31.1;  Surgeon: Marchia Meiers, MD;  Location: [facility];  Service: Ophthalmology;  Laterality: Right;   FEMORAL-TIBIAL BYPASS GRAFT Right 01/03/2021   Procedure: BYPASS GRAFT FEMORAL-TIBIAL ARTERY;  Surgeon: Algernon Huxley, MD;  Location: [facility];  Service: Vascular;  Laterality: Right;   LOWER EXTREMITY ANGIOGRAPHY N/A 10/16/2020   Procedure: Lower Extremity Angiography;  Surgeon: Algernon Huxley, MD;  Location: [facility];  Service: Cardiovascular;  Laterality: N/A;   LOWER EXTREMITY ANGIOGRAPHY Right 10/23/2020   Procedure: LOWER EXTREMITY ANGIOGRAPHY;  Surgeon: Algernon Huxley, MD;  Location: [facility];  Service: Cardiovascular;  Laterality: Right;   VEIN HARVEST Right 01/03/2021   Procedure: VEIN HARVEST;  Surgeon: Algernon Huxley, MD;  Location: [facility];  Service: Vascular;  Laterality: Right;   Social History:  reports that she has been smoking cigarettes. She has been smoking an average of .1 packs per day. She has never used smokeless tobacco. She reports that she does not drink alcohol and does not use drugs.  Allergies  Allergen Reactions   Gabapentin Other (See Comments)  Nervous, shaky   Plavix [Clopidogrel Bisulfate] Rash   Family History  Problem Relation Age of Onset   Heart attack Mother    Heart disease Sister    Hypertension Sister    Family history: Family history reviewed and not pertinent.  Prior to Admission medications   Medication Sig Start Date End Date Taking? Authorizing Provider  albuterol (VENTOLIN HFA) 108 (90 Base) MCG/ACT inhaler Inhale 1-2 puffs into the lungs every 6 (six) hours as needed for wheezing or shortness of breath.    [provider]  alendronate  (FOSAMAX) 70 MG tablet Take 70 mg by mouth once a week. 04/20/19   [provider]  amitriptyline (ELAVIL) 25 MG tablet Take 25 mg by mouth at bedtime.    [provider]  apixaban (ELIQUIS) 2.5 MG TABS tablet Take 1 tablet (2.5 mg total) by mouth 2 (two) times daily. 02/19/21   Kris Hartmann, NP  aspirin EC 81 MG tablet Take 81 mg by mouth daily.    [provider]  atorvastatin (LIPITOR) 20 MG tablet Take 1 tablet (20 mg total) by mouth at bedtime. 10/17/20   Loletha Grayer, MD  cephALEXin (KEFLEX) 500 MG capsule Take 1 capsule (500 mg total) by mouth 3 (three) times daily. 01/12/21   Kris Hartmann, NP  Cholecalciferol (D3-1000) 25 MCG (1000 UT) capsule Take 1,000 Units by mouth daily.    [provider]  collagenase (SANTYL) ointment Apply 1 application topically daily. 02/13/21   Felipa Furnace, DPM  cyanocobalamin 1000 MCG tablet Take 1,000 mcg by mouth every other day.    [provider]  diclofenac Sodium (VOLTAREN) 1 % GEL Apply topically 4 (four) times daily as needed.    [provider]  esomeprazole (NEXIUM) 40 MG capsule Take 40 mg by mouth daily.    [provider]  ferrous sulfate 325 (65 FE) MG tablet Take 325 mg by mouth every other day.    [provider]  FLUoxetine (PROZAC) 40 MG capsule Take 2 capsules (80 mg total) by mouth daily. Take one capsule (40 mg) by mouth once daily for one month, then increase to two capsules (80 mg) thereafter 10/17/20   Loletha Grayer, MD  fluticasone [facility]) 50 MCG/ACT nasal spray Place 1 spray into both nostrils daily as needed for allergies or rhinitis.    [provider]  GNP VITAMIN D 25 MCG (1000 UT) tablet Take 1,000 Units by mouth daily. 11/10/20   [provider]  ipratropium (ATROVENT) 0.03 % nasal spray Place 2 sprays into both nostrils 3 (three) times daily as needed for rhinitis.    [provider]  meclizine (ANTIVERT) 25 MG tablet  Take 1 tablet (25 mg total) by mouth 3 (three) times daily as needed for dizziness or nausea. 09/20/19   Earleen Newport, MD  nystatin (MYCOSTATIN/NYSTOP) powder Apply 1 application topically 2 (two) times daily as needed.    [provider]  oxybutynin (DITROPAN-XL) 10 MG 24 hr tablet Take 1 tablet by mouth daily. 11/16/20 11/16/21  [provider]  oxyCODONE-acetaminophen (PERCOCET/ROXICET) 5-325 MG tablet Take 1-2 tablets by mouth every 6 (six) hours as needed for moderate pain or severe pain. 01/09/21   Stegmayer, Joelene Millin A, PA-C  polyethylene glycol (MIRALAX / GLYCOLAX) 17 g packet Take 17 g by mouth daily as needed for mild constipation.    [provider]  senna-docusate (SENOKOT-S) 8.6-50 MG tablet Take 2 tablets by mouth daily as needed for mild  constipation.    [provider]  silver sulfADIAZINE (SILVADENE) 1 % cream Apply 1 application topically daily. 02/19/21   Kris Hartmann, NP   Physical Exam: Vitals:   05/05/21 1845 05/05/21 1846 05/05/21 1900 05/05/21 1915  BP: 112/64 112/64 130/80   Pulse:  98    Resp: 15 18  19   Temp:      TempSrc:      SpO2:  95%    Weight:      Height:       Constitutional: appears age-appropriate, frail, NAD, calm, comfortable Eyes: PERRL, lids and conjunctivae normal ENMT: Mucous membranes are moist. Posterior pharynx clear of any exudate or lesions. Age-appropriate dentition. Hearing appropriate Neck: normal, supple, no masses, no thyromegaly Respiratory: clear to auscultation bilaterally, no wheezing, no crackles. Normal respiratory effort. No accessory muscle use.  Cardiovascular: Regular rate and rhythm, no murmurs / rubs / gallops.  1+ bilateral lower extremity pitting edema. 2+ pedal pulses. No carotid bruits.  Abdomen: Obese abdomen with pannus, no tenderness, no masses palpated, no hepatosplenomegaly. Bowel sounds positive.  Musculoskeletal: no clubbing / cyanosis. No joint deformity upper and lower  extremities. Good ROM, no contractures, no atrophy. Normal muscle tone.  Skin: no rashes, lesions, ulcers. No induration Neurologic: Sensation intact. Strength 5/5 in all 4.  Psychiatric: Normal judgment and insight. Alert and oriented x 3. Normal mood.   EKG: independently reviewed, showing sinus rhythm of 100, QTc 412 with ST depression in V3 to V6, V1, V2, V3, and aVF  Chest x-ray on Admission: I personally reviewed and I agree with radiologist reading as below.  DG Chest 2 View  Result Date: 05/05/2021 CLINICAL DATA:  Shortness of breath.  Anxiety. EXAM: CHEST - 2 VIEW COMPARISON:  02/22/2020 FINDINGS: Stable cardiomediastinal contours. No signs of pleural effusion. No airspace consolidation. Increased reticular interstitial opacities are identified bilaterally. Findings may reflect mild interstitial edema, atypical infection or chronic lung disease. IMPRESSION: Increased reticular interstitial opacities are noted bilaterally with differential as discussed above. Electronically Signed   By: Kerby Moors M.D.   On: 05/05/2021 16:47    Labs on Admission: I have personally reviewed following labs  CBC: Recent Labs  Lab 05/05/21 1634  WBC 9.0  NEUTROABS 7.4  HGB 2.9*  HCT 10.9*  MCV 68.1*  PLT [id number]*   Basic Metabolic Panel: Recent Labs  Lab 05/05/21 1634 05/05/21 1843  NA 129*  --   K 3.9  --   CL 103  --   CO2 13*  --   GLUCOSE 118*  --   BUN 27*  --   CREATININE 1.60*  --   CALCIUM 8.7*  --   MG  --  1.9  PHOS  --  4.4   GFR: Estimated Creatinine Clearance: 33.4 mL/min (A) (by C-G formula based on SCr of 1.6 mg/dL (H)).  Liver Function Tests: Recent Labs  Lab 05/05/21 1634  AST 48*  ALT 33  ALKPHOS 121  BILITOT 0.5  PROT 7.0  ALBUMIN 3.0*   Urine analysis:    Component Value Date/Time   COLORURINE YELLOW (A) 10/14/2020 1745   APPEARANCEUR CLEAR (A) 10/14/2020 1745   LABSPEC 1.009 10/14/2020 1745   PHURINE 6.0 10/14/2020 1745   GLUCOSEU NEGATIVE  10/14/2020 1745   HGBUR NEGATIVE 10/14/2020 1745   BILIRUBINUR NEGATIVE 10/14/2020 1745   KETONESUR NEGATIVE 10/14/2020 1745   PROTEINUR NEGATIVE 10/14/2020 1745   NITRITE NEGATIVE 10/14/2020 1745   LEUKOCYTESUR NEGATIVE 10/14/2020 1745   CRITICAL  CARE Performed by: Criss Alvine  Total critical care time: 40 minutes  Critical care time was exclusive of separately billable procedures and treating other patients.  Critical care was necessary to treat or prevent imminent or life-threatening deterioration.  Critical care was time spent personally by me on the following activities: development of treatment plan with patient and/or surrogate as well as nursing, discussions with consultants, evaluation of patient's response to treatment, examination of patient, obtaining history from patient or surrogate, ordering and performing treatments and interventions, ordering and review of laboratory studies, ordering and review of radiographic studies, pulse oximetry and re-evaluation of patient's condition.  Dr. Tobie Poet [facility]  If 7PM-7AM, please contact overnight-coverage provider If 7AM-7PM, please contact day coverage provider [email or web address]  05/05/2021, 7:33 PM

## 2021-05-06 ENCOUNTER — Inpatient Hospital Stay: Payer: Medicare Other

## 2021-05-06 DIAGNOSIS — D649 Anemia, unspecified: Secondary | ICD-10-CM | POA: Diagnosis not present

## 2021-05-06 DIAGNOSIS — E8721 Acute metabolic acidosis: Secondary | ICD-10-CM

## 2021-05-06 DIAGNOSIS — I10 Essential (primary) hypertension: Secondary | ICD-10-CM

## 2021-05-06 DIAGNOSIS — R531 Weakness: Secondary | ICD-10-CM

## 2021-05-06 DIAGNOSIS — E538 Deficiency of other specified B group vitamins: Secondary | ICD-10-CM

## 2021-05-06 DIAGNOSIS — D75839 Thrombocytosis, unspecified: Secondary | ICD-10-CM | POA: Diagnosis present

## 2021-05-06 DIAGNOSIS — E871 Hypo-osmolality and hyponatremia: Secondary | ICD-10-CM | POA: Diagnosis present

## 2021-05-06 DIAGNOSIS — K922 Gastrointestinal hemorrhage, unspecified: Secondary | ICD-10-CM | POA: Diagnosis present

## 2021-05-06 DIAGNOSIS — N179 Acute kidney failure, unspecified: Secondary | ICD-10-CM | POA: Diagnosis not present

## 2021-05-06 DIAGNOSIS — F32A Depression, unspecified: Secondary | ICD-10-CM

## 2021-05-06 DIAGNOSIS — R7989 Other specified abnormal findings of blood chemistry: Secondary | ICD-10-CM | POA: Diagnosis present

## 2021-05-06 DIAGNOSIS — R778 Other specified abnormalities of plasma proteins: Secondary | ICD-10-CM

## 2021-05-06 LAB — MRSA NEXT GEN BY PCR, NASAL: MRSA by PCR Next Gen: NOT DETECTED

## 2021-05-06 LAB — CBC
HCT: 17.5 % — ABNORMAL LOW (ref 36.0–46.0)
Hemoglobin: 5.6 g/dL — ABNORMAL LOW (ref 12.0–15.0)
MCH: 23 pg — ABNORMAL LOW (ref 26.0–34.0)
MCHC: 32 g/dL (ref 30.0–36.0)
MCV: 72 fL — ABNORMAL LOW (ref 80.0–100.0)
Platelets: 546 10*3/uL — ABNORMAL HIGH (ref 150–400)
RBC: 2.43 MIL/uL — ABNORMAL LOW (ref 3.87–5.11)
RDW: 20.2 % — ABNORMAL HIGH (ref 11.5–15.5)
WBC: 9.6 10*3/uL (ref 4.0–10.5)
nRBC: 1.5 % — ABNORMAL HIGH (ref 0.0–0.2)

## 2021-05-06 LAB — COMPREHENSIVE METABOLIC PANEL
ALT: 296 U/L — ABNORMAL HIGH (ref 0–44)
AST: 192 U/L — ABNORMAL HIGH (ref 15–41)
Albumin: 3.1 g/dL — ABNORMAL LOW (ref 3.5–5.0)
Alkaline Phosphatase: 180 U/L — ABNORMAL HIGH (ref 38–126)
Anion gap: 9 (ref 5–15)
BUN: 33 mg/dL — ABNORMAL HIGH (ref 8–23)
CO2: 20 mmol/L — ABNORMAL LOW (ref 22–32)
Calcium: 8.7 mg/dL — ABNORMAL LOW (ref 8.9–10.3)
Chloride: 102 mmol/L (ref 98–111)
Creatinine, Ser: 1.62 mg/dL — ABNORMAL HIGH (ref 0.44–1.00)
GFR, Estimated: 33 mL/min — ABNORMAL LOW (ref >60)
Glucose, Bld: 103 mg/dL — ABNORMAL HIGH (ref 70–99)
Potassium: 4 mmol/L (ref 3.5–5.1)
Sodium: 131 mmol/L — ABNORMAL LOW (ref 135–145)
Total Bilirubin: 1.5 mg/dL — ABNORMAL HIGH (ref 0.3–1.2)
Total Protein: 6.5 g/dL (ref 6.5–8.1)

## 2021-05-06 LAB — GLUCOSE, CAPILLARY: Glucose-Capillary: 110 mg/dL — ABNORMAL HIGH (ref 70–99)

## 2021-05-06 LAB — HEMOGLOBIN AND HEMATOCRIT, BLOOD
HCT: 26.9 % — ABNORMAL LOW (ref 36.0–46.0)
Hemoglobin: 8.5 g/dL — ABNORMAL LOW (ref 12.0–15.0)

## 2021-05-06 LAB — PREPARE RBC (CROSSMATCH)

## 2021-05-06 MED ORDER — SODIUM CHLORIDE 0.9% IV SOLUTION: Freq: Once | INTRAVENOUS | Status: AC

## 2021-05-06 MED ORDER — PEG 3350-KCL-NA BICARB-NACL 420 G PO SOLR
4000.0000 mL | Freq: Once | ORAL | Status: AC
  Administered 2021-05-06: 4000 mL via ORAL
  Filled 2021-05-06: qty 4000

## 2021-05-06 MED ORDER — SODIUM CHLORIDE 0.9 % IV SOLN: INTRAVENOUS | Status: DC

## 2021-05-06 MED ORDER — CHLORHEXIDINE GLUCONATE CLOTH 2 % EX PADS
6.0000 | MEDICATED_PAD | Freq: Every day | CUTANEOUS | Status: DC
  Administered 2021-05-06 – 2021-05-09 (×4): 6 via TOPICAL

## 2021-05-06 MED ORDER — BLISTEX MEDICATED EX OINT
TOPICAL_OINTMENT | CUTANEOUS | Status: DC | PRN
  Filled 2021-05-06: qty 6.3

## 2021-05-06 NOTE — Assessment & Plan Note (Signed)
Continue supplementation  ?

## 2021-05-06 NOTE — Progress Notes (Signed)
Progress Note   Patient: Jody Lowe [id number] DOB: 1945/05/28 DOA: 05/05/2021     1 DOS: the patient was seen and examined on 05/06/2021   Brief hospital course: 76 year old female with history of iron deficiency anemia, previously on iron tablets, B12 deficiency anemia, generalized weakness, hyperlipidemia, depressive disorder, hypertension, tobacco use disorder, PAD, obesity, history of TIA, CKD 3A presented with concern for anxiety attack along with melena for several years.  On presentation, sodium was 129, creatinine of 1.60, bicarb of 13, WBC of 9, hemoglobin of 2.9 with hematocrit of 10.9 and platelets of 638.  High-sensitivity troponin was 475.  She received packed red cell transfusion.  She was started on IV Protonix and GI was consulted.  Assessment and Plan: * Acute anemia- (present on admission) Acute on chronic severe anemia -Hemoglobin 2.9 on admission.  Hemoglobin 5.6 this morning after 2 unit packed red cell transfusion.  2 more units have been ordered.  Monitor H&H.  GI bleed- (present on admission) -Patient apparently has had melena for years - GI evaluation pending.  Continue Protonix drip.  NPO.  Hyponatremia- (present on admission) - Possibly from poor oral intake.  Sodium 131 today.  Continue IV fluids.  Monitor.  AKI (acute kidney injury) ([place])- (present on admission) - Possibly prerenal.  Creatinine still elevated.  Continue IV fluids.  Repeat a.m. labs.  Elevated troponin- (present on admission) - Possibly from demand ischemia from anemia.  Troponin 475 on presentation, did not trend upwards and repeat troponin was 396. -No chest pain.  No further work-up needed.  B12 deficiency- (present on admission) - Continue supplementation.  Hyperlipidemia- (present on admission) - Hold statin due to elevated LFTs  Depressive disorder- (present on admission) - Continue home regimen.  Outpatient follow-up with PCP or psychiatry  Generalized weakness - Will  need PT evaluation once more stable.  Acute metabolic acidosis- (present on admission) - Possibly from AKI and poor oral intake.  Improving.  Continue IV fluids.  Repeat a.m. labs.  Elevated LFTs- (present on admission) - Questionable cause.  Right upper quadrant ultrasound.  Repeat a.m. LFTs.  Check hepatitis panel in the a.m. -Hold statin.  Hypertension, benign- (present on admission) - Blood pressure on the lower side.  Monitor.        Subjective:  Patient seen and examined at bedside.  Poor historian.  Denies worsening abdominal pain.  No fever, vomiting, agitation reported.  Physical Exam: Vitals:   05/06/21 0815 05/06/21 0900 05/06/21 1001 05/06/21 1008  BP: 122/63 (!) 121/54 121/63   Pulse: 86 90 91 90  Resp: 14 14 20  (!) 23  Temp: 98.8 F (37.1 C)  98.8 F (37.1 C)   TempSrc: Oral  Oral   SpO2: 98% 97% 90% 94%  Weight:      Height:       General: No acute distress, currently on room air.  Elderly female lying in bed. ENT/neck: No elevated JVD.  No obvious masses  respiratory: Bilateral decreased breath sounds at bases with some scattered crackles CVS: S1-S2 heard, rate controlled Abdominal: Soft, nontender, nondistended, no organomegaly, bowel sounds heard Extremities: No cyanosis, clubbing, edema CNS: Sleepy, wakes up slightly, extremely slow to respond.  Poor historian.  No focal neurologic deficit.  Moving extremities. Lymph: No cervical lymphadenopathy Skin: No rashes, lesions, ulcers Psych: Affect is mostly flat.  No signs of agitation currently.  Does not participate in conversation much. Musculoskeletal: No obvious joint deformity/tenderness/swelling   Data Reviewed: I have reviewed all the  lab since admission myself.  2 days sodium is 131, creatinine of 1.62, bicarb of 20, AST of 192, ALT of 296, total bili of 1.5, alkaline phosphatase of 180, hemoglobin of 5.6, platelets of 546   Family Communication: None at bedside  Disposition: Status is:  Inpatient Remains inpatient appropriate because: Of need for transfusion, IV fluids and GI evaluation.    Planned Discharge Destination: Home     Time spent: 50 minutes  Author: Aline August, MD 05/06/2021 10:20 AM  For on call review [email or web address].

## 2021-05-06 NOTE — Hospital Course (Signed)
76 year old female with history of iron deficiency anemia, previously on iron tablets, B12 deficiency anemia, generalized weakness, hyperlipidemia, depressive disorder, hypertension, tobacco use disorder, PAD, obesity, history of TIA, CKD 3A presented with concern for anxiety attack along with melena for several years.  On presentation, sodium was 129, creatinine of 1.60, bicarb of 13, WBC of 9, hemoglobin of 2.9 with hematocrit of 10.9 and platelets of 638.  High-sensitivity troponin was 475.  She received packed red cell transfusion.  She was started on IV Protonix and GI was consulted.

## 2021-05-06 NOTE — Progress Notes (Signed)
Date and time results received: 05/06/21 0515   Test: Hgb Critical Value: 5.6  Name of Provider Notified: Manuela Schwartz, NP  Orders Received? Or Actions Taken?: Actions Taken: Transfuse 2 units PRBC

## 2021-05-06 NOTE — Assessment & Plan Note (Addendum)
-   Possibly from poor oral intake.  Sodium improving to 134 today.  Off IV fluids.  Repeat a.m. labs.

## 2021-05-06 NOTE — Consult Note (Signed)
Consultation  Referring Provider:  Hospitalist Admit date: 2/11 Consult date      2/11   Reason for Consultation:     IDA         HPI:   Jody Lowe is a 76 y.o. lady with history of PVD on NOAC, hypertension, and history of colonic AVM's here for IDA and melenic stools. Patient was seen at her PCP with labs showing hemoglobin of 5.9 but unclear if this was relayed to patient. Patient had progressive fatigue and shortness of breath till presentation yesterday which showed hemoglobin of 2.9. Patient has history of presenting similar and had colonic avms a few years ago. Her last dose of eliquis was two days ago. No significant abdominal surgeries. No NSAIDS.  Past Medical History:  Diagnosis Date   Anemia    Hypertension    Peripheral artery disease (HCC)    Renal disorder     Past Surgical History:  Procedure Laterality Date   BREAST REDUCTION SURGERY     CARDIAC CATHETERIZATION     CATARACT EXTRACTION W/PHACO Left 11/11/2019   Procedure: CATARACT EXTRACTION PHACO AND INTRAOCULAR LENS PLACEMENT (IOC) LEFT VISION BLUE;  Surgeon: Elliot Cousin, MD;  Location: [facility];  Service: Ophthalmology;  Laterality: Left;  7.94 0:42.4   CATARACT EXTRACTION W/PHACO Right 12/23/2019   Procedure: CATARACT EXTRACTION PHACO AND INTRAOCULAR LENS PLACEMENT (IOC) RIGHT VISION BLUE 5.47  00:31.1;  Surgeon: Elliot Cousin, MD;  Location: [facility];  Service: Ophthalmology;  Laterality: Right;   FEMORAL-TIBIAL BYPASS GRAFT Right 01/03/2021   Procedure: BYPASS GRAFT FEMORAL-TIBIAL ARTERY;  Surgeon: Annice Needy, MD;  Location: [facility];  Service: Vascular;  Laterality: Right;   LOWER EXTREMITY ANGIOGRAPHY N/A 10/16/2020   Procedure: Lower Extremity Angiography;  Surgeon: Annice Needy, MD;  Location: [facility];  Service: Cardiovascular;  Laterality: N/A;   LOWER EXTREMITY ANGIOGRAPHY Right 10/23/2020   Procedure: LOWER EXTREMITY ANGIOGRAPHY;  Surgeon: Annice Needy, MD;   Location: [facility];  Service: Cardiovascular;  Laterality: Right;   VEIN HARVEST Right 01/03/2021   Procedure: VEIN HARVEST;  Surgeon: Annice Needy, MD;  Location: [facility];  Service: Vascular;  Laterality: Right;    Family History  Problem Relation Age of Onset   Heart attack Mother    Heart disease Sister    Hypertension Sister     Social History   Tobacco Use   Smoking status: Some Days    Packs/day: 0.10    Types: Cigarettes   Smokeless tobacco: Never  Vaping Use   Vaping Use: Former  Substance Use Topics   Alcohol use: No   Drug use: No    Prior to Admission medications   Medication Sig Start Date End Date Taking? Authorizing Provider  albuterol (VENTOLIN HFA) 108 (90 Base) MCG/ACT inhaler Inhale 1-2 puffs into the lungs every 6 (six) hours as needed for wheezing or shortness of breath.    [provider]  alendronate (FOSAMAX) 70 MG tablet Take 70 mg by mouth once a week. 04/20/19   [provider]  amitriptyline (ELAVIL) 25 MG tablet Take 25 mg by mouth at bedtime.    [provider]  apixaban (ELIQUIS) 2.5 MG TABS tablet Take 1 tablet (2.5 mg total) by mouth 2 (two) times daily. 02/19/21   Georgiana Spinner, NP  aspirin EC 81 MG tablet Take 81 mg by mouth daily.    [provider]  atorvastatin (LIPITOR) 20 MG tablet Take  1 tablet (20 mg total) by mouth at bedtime. 10/17/20   Alford Highland, MD  cephALEXin (KEFLEX) 500 MG capsule Take 1 capsule (500 mg total) by mouth 3 (three) times daily. 01/12/21   Georgiana Spinner, NP  Cholecalciferol (D3-1000) 25 MCG (1000 UT) capsule Take 1,000 Units by mouth daily.    [provider]  collagenase (SANTYL) ointment Apply 1 application topically daily. 02/13/21   Candelaria Stagers, DPM  cyanocobalamin 1000 MCG tablet Take 1,000 mcg by mouth every other day.    [provider]  diclofenac Sodium (VOLTAREN) 1 % GEL Apply topically 4 (four) times daily as needed.    [provider]  esomeprazole (NEXIUM) 40 MG capsule Take 40 mg by mouth daily.    [provider]  ferrous sulfate 325 (65 FE) MG tablet Take 325 mg by mouth every other day.    [provider]  FLUoxetine (PROZAC) 40 MG capsule Take 2 capsules (80 mg total) by mouth daily. Take one capsule (40 mg) by mouth once daily for one month, then increase to two capsules (80 mg) thereafter 10/17/20   Alford Highland, MD  fluticasone [facility]) 50 MCG/ACT nasal spray Place 1 spray into both nostrils daily as needed for allergies or rhinitis.    [provider]  GNP VITAMIN D 25 MCG (1000 UT) tablet Take 1,000 Units by mouth daily. 11/10/20   [provider]  ipratropium (ATROVENT) 0.03 % nasal spray Place 2 sprays into both nostrils 3 (three) times daily as needed for rhinitis.    [provider]  meclizine (ANTIVERT) 25 MG tablet Take 1 tablet (25 mg total) by mouth 3 (three) times daily as needed for dizziness or nausea. 09/20/19   Emily Filbert, MD  nystatin (MYCOSTATIN/NYSTOP) powder Apply 1 application topically 2 (two) times daily as needed.    [provider]  oxybutynin (DITROPAN-XL) 10 MG 24 hr tablet Take 1 tablet by mouth daily. 11/16/20 11/16/21  [provider]  oxyCODONE-acetaminophen (PERCOCET/ROXICET) 5-325 MG tablet Take 1-2 tablets by mouth every 6 (six) hours as needed for moderate pain or severe pain. 01/09/21   Stegmayer, Cala Bradford A, PA-C  polyethylene glycol (MIRALAX / GLYCOLAX) 17 g packet Take 17 g by mouth daily as needed for mild constipation.    [provider]  senna-docusate (SENOKOT-S) 8.6-50 MG tablet Take 2 tablets by mouth daily as needed for mild constipation.    [provider]  silver sulfADIAZINE (SILVADENE) 1 % cream Apply 1 application topically daily. 02/19/21   Georgiana Spinner, NP    Current Facility-Administered Medications  Medication Dose Route Frequency Provider Last Rate Last Admin    0.9 %  sodium chloride infusion  10 mL/hr Intravenous Once Cox, Amy N, DO   Stopped at 05/05/21 2345   0.9 %  sodium chloride infusion   Intravenous Continuous Glade Lloyd, MD 75 mL/hr at 05/06/21 1345 New Bag at 05/06/21 1345   acetaminophen (TYLENOL) tablet 650 mg  650 mg Oral Q6H PRN Cox, Amy N, DO       Or   acetaminophen (TYLENOL) suppository 650 mg  650 mg Rectal Q6H PRN Cox, Amy N, DO       albuterol (PROVENTIL) (2.5 MG/3ML) 0.083% nebulizer solution 2.5 mg  2.5 mg Nebulization Q6H PRN Otelia Sergeant, RPH       amitriptyline (ELAVIL) tablet 25 mg  25 mg Oral QHS Cox, Amy N, DO       Chlorhexidine Gluconate  Cloth 2 % PADS 6 each  6 each Topical Daily Cox, Amy N, DO       cholecalciferol (VITAMIN D3) tablet 1,000 Units  1,000 Units Oral Daily Cox, Amy N, DO   1,000 Units at 05/06/21 1142   diclofenac Sodium (VOLTAREN) 1 % topical gel 2 g  2 g Topical QID PRN Cox, Amy N, DO       FLUoxetine (PROZAC) capsule 80 mg  80 mg Oral Daily Cox, Amy N, DO   80 mg at 05/06/21 1142   fluticasone (FLONASE) 50 MCG/ACT nasal spray 1 spray  1 spray Each Nare Daily PRN Cox, Amy N, DO       lip balm (BLISTEX) ointment   Topical PRN Cox, Amy N, DO       meclizine (ANTIVERT) tablet 25 mg  25 mg Oral TID PRN Cox, Amy N, DO       ondansetron (ZOFRAN) injection 4 mg  4 mg Intravenous Once Chesley NoonJessup, Charles, MD       ondansetron [facility](ZOFRAN) tablet 4 mg  4 mg Oral Q6H PRN Cox, Amy N, DO       Or   ondansetron (ZOFRAN) injection 4 mg  4 mg Intravenous Q6H PRN Cox, Amy N, DO       oxybutynin (DITROPAN-XL) 24 hr tablet 10 mg  10 mg Oral Daily Cox, Amy N, DO   10 mg at 05/06/21 1142   [START ON 05/09/2021] pantoprazole (PROTONIX) injection 40 mg  40 mg Intravenous Q12H Cox, Amy N, DO       pantoprozole (PROTONIX) 80 mg /NS 100 mL infusion  8 mg/hr Intravenous Continuous Cox, Amy N, DO 10 mL/hr at 05/06/21 1017 8 mg/hr at 05/06/21 1017   polyethylene glycol (MIRALAX / GLYCOLAX) packet 17 g  17 g Oral Daily PRN Cox, Amy  N, DO       polyethylene glycol-electrolytes (NuLYTELY) solution 4,000 mL  4,000 mL Oral Once Regis BillLocklear, Godwin Tedesco T, MD       senna-docusate (Senokot-S) tablet 2 tablet  2 tablet Oral Daily PRN Cox, Amy N, DO       vitamin B-12 (CYANOCOBALAMIN) tablet 1,000 mcg  1,000 mcg Oral QODAY Cox, Amy N, DO        Allergies as of 05/05/2021 - Review Complete 05/05/2021  Allergen Reaction Noted   Gabapentin Other (See Comments) 07/26/2012   Plavix [clopidogrel bisulfate] Rash 09/27/2014     Review of Systems:     Review of Systems  Constitutional:  Positive for malaise/fatigue. Negative for chills and fever.  Cardiovascular:  Negative for chest pain.  Gastrointestinal:  Negative for abdominal pain and diarrhea.  Musculoskeletal:  Positive for joint pain.  Neurological:  Negative for focal weakness.  Psychiatric/Behavioral:  Negative for substance abuse.   All other systems reviewed and are negative.     Physical Exam:  Vital signs in last 24 hours: Temp:  [97.7 F (36.5 C)-98.8 F (37.1 C)] 98.7 F (37.1 C) (02/12 1332) Pulse Rate:  [86-98] 89 (02/12 1332) Resp:  [10-32] 17 (02/12 1332) BP: (89-130)/(47-83) 116/52 (02/12 1332) SpO2:  [16 %-100 %] 93 % (02/12 1332) Weight:  [90.7 kg] 90.7 kg (02/11 1547)   General:   Pleasant in NAD Head:  Normocephalic and atraumatic. Eyes:   No icterus.   Conjunctiva pink. Mouth: Mucosa pink moist, no lesions. Neck:  Supple; no masses felt Lungs:  No respiratory distress Heart:  RRR Abdomen:   Flat, soft, nondistended, nontender. Normal bowel sounds. No  appreciable masses or hepatomegaly. No rebound signs or other peritoneal signs. Msk:  MAEW x4, No clubbing or cyanosis. Strength 5/5. Symmetrical without gross deformities. Neurologic:  Alert and  oriented x4;  Cranial nerves II-XII intact.  Skin:  Warm, dry, pink without significant lesions or rashes. Psych:  Alert and cooperative. Normal affect.  LAB RESULTS: Recent Labs    05/05/21 1634  05/06/21 0418  WBC 9.0 9.6  HGB 2.9* 5.6*  HCT 10.9* 17.5*  PLT 638* 546*   BMET Recent Labs    05/05/21 1634 05/06/21 0418  NA 129* 131*  K 3.9 4.0  CL 103 102  CO2 13* 20*  GLUCOSE 118* 103*  BUN 27* 33*  CREATININE 1.60* 1.62*  CALCIUM 8.7* 8.7*   LFT Recent Labs    05/06/21 0418  PROT 6.5  ALBUMIN 3.1*  AST 192*  ALT 296*  ALKPHOS 180*  BILITOT 1.5*   PT/INR No results for input(s): LABPROT, INR in the last 72 hours.  STUDIES: DG Chest 2 View  Result Date: 05/05/2021 CLINICAL DATA:  Shortness of breath.  Anxiety. EXAM: CHEST - 2 VIEW COMPARISON:  02/22/2020 FINDINGS: Stable cardiomediastinal contours. No signs of pleural effusion. No airspace consolidation. Increased reticular interstitial opacities are identified bilaterally. Findings may reflect mild interstitial edema, atypical infection or chronic lung disease. IMPRESSION: Increased reticular interstitial opacities are noted bilaterally with differential as discussed above. Electronically Signed   By: Signa Kell M.D.   On: 05/05/2021 16:47       Impression / Plan:   76 y/o lady with PVD on eliquis who presents with anemia of 2.9, melenic stools, and history of colonic AVM's  - will plan on EGD/Colonoscopy tomorrow - continue clear liquid diet - NPO at midnight, except prep - prep ordered - transfuse if hemoglobin < 7 - monitor for any overt GI bleeding - monitor LFT's, were unremarkable on presentation, could be secondary to blood transfusions  Further recs after EGD/Colonoscopy tomorrow. Please call with any questions or concerns.  Merlyn Lot MD, MPH [facility]

## 2021-05-06 NOTE — Progress Notes (Addendum)
PT Cancellation Note  Patient Details Name: Jody Lowe MRN: [id number] DOB: September 18, 1945   Cancelled Treatment:    Reason Eval/Treat Not Completed: Patient not medically ready. Chart reviewed. Patient with hemoglobin of 5.6 after 2 unit packed red cell transfusion with more units ordered. Notes also indicated patient will need PT evaluation once more stable. PT will hold and follow up tomorrow as appropriate.   Donna Bernard, PT, MPT  Ina Homes 05/06/2021, 3:05 PM

## 2021-05-06 NOTE — Assessment & Plan Note (Addendum)
-  Patient apparently has had melena for years GI consulted, s/p EGD negative, colonoscopy poor prep, GI recommended to continue DOAC and aspirin, benefit outweighs the risk of bleeding.  GI bleeding could be secondary to prior history of AVM Follow CBC daily Follow GI as an outpatient for repeat colonoscopy S/p IV Protonix, switch to pantoprazole 40 mg p.o. daily

## 2021-05-06 NOTE — Assessment & Plan Note (Addendum)
-   Possibly from AKI and poor oral intake. -Resolved

## 2021-05-06 NOTE — Plan of Care (Signed)
Continuing with plan of care. 

## 2021-05-06 NOTE — Progress Notes (Signed)
Pt was awake so Ch asked to visit. Wonderful conversation, singing and prayer. So happy to see that Pt had finished entire lunch tray.

## 2021-05-06 NOTE — Progress Notes (Signed)
Pt asleep. Ch prayed. Ch aware of AD request; procured form, looked for spouse, has not located him yet. Will followup.

## 2021-05-06 NOTE — Assessment & Plan Note (Addendum)
-   Possibly from demand ischemia from anemia.  Troponin 475 on presentation, did not trend upwards and repeat troponin was 396. -No chest pain.  No further work-up needed. 2/17 cardiology consulted for further recommendation

## 2021-05-06 NOTE — Assessment & Plan Note (Signed)
-   Possibly reactive.  Monitor

## 2021-05-06 NOTE — Assessment & Plan Note (Addendum)
Acute on chronic severe anemia -Hemoglobin 2.9 on admission.  Status post 4 units packed red cell transfusion.  Hemoglobin 8.2 today. Monitor H&H.

## 2021-05-06 NOTE — Assessment & Plan Note (Addendum)
-   Questionable cause.  Right upper quadrant ultrasound showed liver appearance suspicious for cirrhosis but no masses along with trace ascites.  LFTs pending for today.  Repeat a.m. LFTs.  Follow GI recommendations.  Hepatitis panel negative. -Statin on hold

## 2021-05-06 NOTE — Assessment & Plan Note (Signed)
-   Continue home regimen.  Outpatient follow-up with PCP or psychiatry

## 2021-05-06 NOTE — Assessment & Plan Note (Addendum)
-   Blood pressure intermittently on the lower side..  Monitor. 2/15 started Lopressor 25 mg twice daily due to A-fib with RVR, patient converted back to normal sinus rhythm

## 2021-05-06 NOTE — Assessment & Plan Note (Addendum)
-   PT recommends SNF placement.  Social worker consult.

## 2021-05-06 NOTE — Assessment & Plan Note (Addendum)
-   Possibly prerenal.  Creatinine improving.  Repeat a.m. labs

## 2021-05-06 NOTE — Assessment & Plan Note (Addendum)
Hold statin due to elevated LFTs. 

## 2021-05-07 ENCOUNTER — Encounter: Payer: Self-pay | Admitting: Anesthesiology

## 2021-05-07 DIAGNOSIS — E538 Deficiency of other specified B group vitamins: Secondary | ICD-10-CM | POA: Diagnosis not present

## 2021-05-07 DIAGNOSIS — N179 Acute kidney failure, unspecified: Secondary | ICD-10-CM | POA: Diagnosis not present

## 2021-05-07 DIAGNOSIS — E876 Hypokalemia: Secondary | ICD-10-CM

## 2021-05-07 DIAGNOSIS — D649 Anemia, unspecified: Secondary | ICD-10-CM | POA: Diagnosis not present

## 2021-05-07 DIAGNOSIS — D72829 Elevated white blood cell count, unspecified: Secondary | ICD-10-CM

## 2021-05-07 DIAGNOSIS — E8721 Acute metabolic acidosis: Secondary | ICD-10-CM | POA: Diagnosis not present

## 2021-05-07 LAB — COMPREHENSIVE METABOLIC PANEL
ALT: 252 U/L — ABNORMAL HIGH (ref 0–44)
AST: 204 U/L — ABNORMAL HIGH (ref 15–41)
Albumin: 2.9 g/dL — ABNORMAL LOW (ref 3.5–5.0)
Alkaline Phosphatase: 192 U/L — ABNORMAL HIGH (ref 38–126)
Anion gap: 6 (ref 5–15)
BUN: 26 mg/dL — ABNORMAL HIGH (ref 8–23)
CO2: 19 mmol/L — ABNORMAL LOW (ref 22–32)
Calcium: 8.3 mg/dL — ABNORMAL LOW (ref 8.9–10.3)
Chloride: 104 mmol/L (ref 98–111)
Creatinine, Ser: 1.3 mg/dL — ABNORMAL HIGH (ref 0.44–1.00)
GFR, Estimated: 42 mL/min — ABNORMAL LOW (ref >60)
Glucose, Bld: 105 mg/dL — ABNORMAL HIGH (ref 70–99)
Potassium: 3.3 mmol/L — ABNORMAL LOW (ref 3.5–5.1)
Sodium: 129 mmol/L — ABNORMAL LOW (ref 135–145)
Total Bilirubin: 1.9 mg/dL — ABNORMAL HIGH (ref 0.3–1.2)
Total Protein: 6.5 g/dL (ref 6.5–8.1)

## 2021-05-07 LAB — HEPATITIS PANEL, ACUTE
HCV Ab: NONREACTIVE
Hep A IgM: NONREACTIVE
Hep B C IgM: NONREACTIVE
Hepatitis B Surface Ag: NONREACTIVE

## 2021-05-07 LAB — CBC WITH DIFFERENTIAL/PLATELET
Abs Immature Granulocytes: 0.34 10*3/uL — ABNORMAL HIGH (ref 0.00–0.07)
Basophils Absolute: 0.1 10*3/uL (ref 0.0–0.1)
Basophils Relative: 0 %
Eosinophils Absolute: 0 10*3/uL (ref 0.0–0.5)
Eosinophils Relative: 0 %
HCT: 25.1 % — ABNORMAL LOW (ref 36.0–46.0)
Hemoglobin: 8 g/dL — ABNORMAL LOW (ref 12.0–15.0)
Immature Granulocytes: 2 %
Lymphocytes Relative: 3 %
Lymphs Abs: 0.5 10*3/uL — ABNORMAL LOW (ref 0.7–4.0)
MCH: 24.4 pg — ABNORMAL LOW (ref 26.0–34.0)
MCHC: 31.9 g/dL (ref 30.0–36.0)
MCV: 76.5 fL — ABNORMAL LOW (ref 80.0–100.0)
Monocytes Absolute: 1.9 10*3/uL — ABNORMAL HIGH (ref 0.1–1.0)
Monocytes Relative: 11 %
Neutro Abs: 15.5 10*3/uL — ABNORMAL HIGH (ref 1.7–7.7)
Neutrophils Relative %: 84 %
Platelets: 485 10*3/uL — ABNORMAL HIGH (ref 150–400)
RBC: 3.28 MIL/uL — ABNORMAL LOW (ref 3.87–5.11)
RDW: 20.5 % — ABNORMAL HIGH (ref 11.5–15.5)
WBC: 18.3 10*3/uL — ABNORMAL HIGH (ref 4.0–10.5)
nRBC: 1.1 % — ABNORMAL HIGH (ref 0.0–0.2)

## 2021-05-07 LAB — H PYLORI, IGM, IGG, IGA AB
H Pylori IgG: 0.26 Index Value (ref 0.00–0.79)
H. Pylogi, Iga Abs: <9 units (ref 0.0–8.9)
H. Pylogi, Igm Abs: <9 units (ref 0.0–8.9)

## 2021-05-07 LAB — PREPARE RBC (CROSSMATCH)

## 2021-05-07 LAB — PATHOLOGIST SMEAR REVIEW

## 2021-05-07 LAB — MAGNESIUM: Magnesium: 1.7 mg/dL (ref 1.7–2.4)

## 2021-05-07 MED ORDER — MAGNESIUM SULFATE 2 GM/50ML IV SOLN
2.0000 g | Freq: Once | INTRAVENOUS | Status: AC
  Administered 2021-05-07: 2 g via INTRAVENOUS
  Filled 2021-05-07: qty 50

## 2021-05-07 MED ORDER — PANTOPRAZOLE SODIUM 40 MG IV SOLR
40.0000 mg | Freq: Two times a day (BID) | INTRAVENOUS | Status: DC
  Administered 2021-05-07 – 2021-05-08 (×3): 40 mg via INTRAVENOUS
  Filled 2021-05-07 (×3): qty 10

## 2021-05-07 MED ORDER — ORAL CARE MOUTH RINSE
15.0000 mL | Freq: Two times a day (BID) | OROMUCOSAL | Status: DC
  Administered 2021-05-07 – 2021-05-12 (×10): 15 mL via OROMUCOSAL

## 2021-05-07 MED ORDER — POTASSIUM CHLORIDE 10 MEQ/100ML IV SOLN
10.0000 meq | INTRAVENOUS | Status: AC
  Administered 2021-05-07 (×6): 10 meq via INTRAVENOUS
  Filled 2021-05-07 (×6): qty 100

## 2021-05-07 NOTE — Progress Notes (Signed)
Initial Nutrition Assessment  DOCUMENTATION CODES:   Obesity unspecified  INTERVENTION:   RD will add supplements once pt's diet is advanced  Recommend MVI po daily   Recommend check iron/anemia labs, folic acid and F68  NUTRITION DIAGNOSIS:   Inadequate oral intake related to acute illness as evidenced by NPO status.  GOAL:   Patient will meet greater than or equal to 90% of their needs  MONITOR:   Diet advancement, Labs, Weight trends, Skin, I & O's  REASON FOR ASSESSMENT:   Malnutrition Screening Tool    ASSESSMENT:   76 year old female with history of iron deficiency anemia, B12 deficiency anemia, hyperlipidemia, depressive disorder, hypertension, tobacco use disorder, PAD, obesity, TIA and CKD III who is admitted with GIB, AKI and anemia.  Met with pt in room today. Pt reports that she is not feeling well today. Pt reports good appetite and oral intake at baseline. Pt reports that she drinks vanilla Ensure daily at home. Pt currently NPO for EGD/colonoscopy today. RD will add supplements once pt's diet is advanced. Pt with h/o iron, L27 and folic acid deficiency; recommend checking labs as pt admitted with anemia. Spoke with MD, who recommends follow-up to check labs as an outpatient. Per chart review, pt appears weight stable pta if her admit weight is correct.   Medications reviewed and include: D3, zofran, protonix, B12, KCl  Labs reviewed: Na 129(L), K 3.3(L), BUN 26(H), creat 1.30(H), Mg 1.7 wnl Wbc- 18.3(H), Hgb 8.0(L), Hct 25.1(L), MC 76.5(L), MCH 24.4(L)  NUTRITION - FOCUSED PHYSICAL EXAM:  Flowsheet Row Most Recent Value  Orbital Region No depletion  Upper Arm Region No depletion  Thoracic and Lumbar Region No depletion  Buccal Region No depletion  Temple Region No depletion  Clavicle Bone Region No depletion  Clavicle and Acromion Bone Region No depletion  Scapular Bone Region No depletion  Dorsal Hand No depletion  Patellar Region No depletion   Anterior Thigh Region No depletion  Posterior Calf Region No depletion  Edema (RD Assessment) None  Hair Reviewed  Eyes Reviewed  Mouth Reviewed  Skin Reviewed  Nails Reviewed   Diet Order:   Diet Order             Diet NPO time specified Except for: Other (See Comments)  Diet effective midnight                  EDUCATION NEEDS:   Education needs have been addressed  Skin:  Skin Assessment: Reviewed RN Assessment (R foot wound)  Last BM:  pta  Height:   Ht Readings from Last 1 Encounters:  05/05/21 5' 6" (1.676 m)    Weight:   Wt Readings from Last 1 Encounters:  05/05/21 90.7 kg    Ideal Body Weight:  59 kg  BMI:  Body mass index is 32.28 kg/m.  Estimated Nutritional Needs:   Kcal:  1700-2000kcal/day  Protein:  85-100g/day protein  Fluid:  1.5-1.8L/day  Koleen Distance MS, RD, LDN Please refer to [facility] for RD and/or RD on-call/weekend/after hours pager

## 2021-05-07 NOTE — Progress Notes (Signed)
Progress Note   Patient: Jody Lowe:[id number] DOB: [birth year] DOA: 05/05/2021     2 DOS: the patient was seen and examined on 05/07/2021   Brief hospital course: 76 year old female with history of iron deficiency anemia, previously on iron tablets, B12 deficiency anemia, generalized weakness, hyperlipidemia, depressive disorder, hypertension, tobacco use disorder, PAD, obesity, history of TIA, CKD 3A presented with concern for anxiety attack along with melena for several years.  On presentation, sodium was 129, creatinine of 1.60, bicarb of 13, WBC of 9, hemoglobin of 2.9 with hematocrit of 10.9 and platelets of 638.  High-sensitivity troponin was 475.  She received packed red cell transfusion.  She was started on IV Protonix and GI was consulted.  Assessment and Plan: * Acute anemia- (present on admission) Acute on chronic severe anemia -Hemoglobin 2.9 on admission.  Status post 4 units packed red cell transfusion.  Hemoglobin 8 today. Monitor H&H.  GI bleed- (present on admission) -Patient apparently has had melena for years - GI following and planning for EGD and colonoscopy today.  Continue Protonix drip.  NPO.  Hyponatremia- (present on admission) - Possibly from poor oral intake.  Sodium 129 today.  DC IV fluids.  Might have to give 1 dose of IV Lasix.  Repeat a.m. labs.  AKI (acute kidney injury) (HCC)- (present on admission) - Possibly prerenal.  Creatinine improving.  Repeat a.m. labs  Elevated troponin- (present on admission) - Possibly from demand ischemia from anemia.  Troponin 475 on presentation, did not trend upwards and repeat troponin was 396. -No chest pain.  No further work-up needed.  B12 deficiency- (present on admission) - Continue supplementation.  Hyperlipidemia- (present on admission) - Hold statin due to elevated LFTs  Depressive disorder- (present on admission) - Continue home regimen.  Outpatient follow-up with PCP or psychiatry  Generalized  weakness - Will need PT evaluation once more stable.  Acute metabolic acidosis- (present on admission) - Possibly from AKI and poor oral intake.  Plan as above.  Monitor.  Elevated LFTs- (present on admission) - Questionable cause.  Right upper quadrant ultrasound showed liver appearance suspicious for cirrhosis but no masses along with trace ascites.  LFTs worsening.  Hepatitis panel pending. -Statin on hold  Leukocytosis - Possibly reactive.  Repeat a.m. labs.  Hypokalemia - Replace.  Repeat a.m. labs.  Thrombocytosis- (present on admission) - Possibly reactive.  Monitor  Hypertension, benign- (present on admission) - Blood pressure on the lower side.  Monitor.        Subjective:  Patient seen and examined at bedside.  Poor historian.  No overnight fever, vomiting, worsening abdominal pain reported.  Feels weak.  Physical Exam: Vitals:   05/07/21 0400 05/07/21 0500 05/07/21 0600 05/07/21 0700  BP: 116/62 111/65 110/62 104/61  Pulse: 99 99 100 (!) 102  Resp: 17 16 17 17   Temp:      TempSrc:      SpO2: 96% 97% 99% 96%  Weight:      Height:       General: Currently on 2 L oxygen via nasal cannula.  No distress.  Elderly female lying in bed. ENT/neck: No thyromegaly.  JVD is not elevated  respiratory: Decreased breath sounds at bases bilaterally with some crackles  CVS: Currently rate controlled; S1-S2 heard  abdominal: Soft, nontender, distended slightly; normal bowel sounds are heard no organomegaly, bowel sounds heard Extremities: Mild lower extremity edema present; no clubbing CNS: Still extremely slow to respond; alert.  Poor historian.  No focal  neurologic deficit.  Moves extremities  lymph: No palpable lymphadenopathy Skin: No obvious ecchymosis/rashes Psych: Not agitated.  Very flat affect.  Does not participate in conversation much. Musculoskeletal: No obvious joint swelling or tenderness  Data Reviewed: I have reviewed the labs myself.  This morning,  sodium is 129, potassium 3.3, creatinine 1.3, alkaline phosphatase 192, AST of 204, ALT of 252, total bili of 1.9, WBC 18.3, hemoglobin of 8, platelets of 485  Family Communication: None at bedside  Disposition: Status is: Inpatient Remains inpatient appropriate because: Of severity of illness    Planned Discharge Destination: Home     Time spent: 50 minutes  Author: Aline August, MD 05/07/2021 8:11 AM  For on call review [email or web address].

## 2021-05-07 NOTE — Evaluation (Signed)
Occupational Therapy Evaluation Patient Details Name: Jody Lowe MRN: [id number] DOB: December 03, 1945 Today's Date: 05/07/2021   History of Present Illness The patient is a 76 year old female with history of iron deficiency anemia, previously on iron tablets, B12 deficiency anemia, generalized weakness, hyperlipidemia, depressive disorder, hypertension, tobacco use disorder, PAD, obesity, history of TIA, CKD 3A presented with concern for anxiety attack along with melena.   Clinical Impression   Pt seen for OT evaluation this date. Upon arrival to room, pt awake and seated upright in bed on 2L of O2 via [place]. Prior to admission, pt was MOD-I with 3WW for functional mobility and ADLs (occassionally received assist from son for donning socks/shoes when fatigued), living alone in a 1-level apartment with level entry. Pt reports that she has family that can assist following d/c. Pt reports that she does not use supplemental O2 at baseline.  Pt currently requires MIN A for bed mobility, MIN A for transfer bed>recliner via step pivot transfer with RW, MIN A for seated LB dressing, and SET-UP assist for seated grooming tasks d/t decreased strength and activity tolerance. While donning/doffing socks during seated LB dressing, pt required x3 rest breaks and was observed to be wheezing (SpO2 >95%). Pt also reporting mild chest pain following transfer and during LB dressing; RN informed and further mobility deferred. Pt left sitting upright in recliner, in no acute distress. Pt would benefit from additional skilled OT services to maximize return to PLOF and minimize risk of future falls, injury, caregiver burden, and readmission. Upon discharge, recommend SNF (although pt declining at this time).     Recommendations for follow up therapy are one component of a multi-disciplinary discharge planning process, led by the attending physician.  Recommendations may be updated based on patient status, additional functional  criteria and insurance authorization.   Follow Up Recommendations  Skilled nursing-short term rehab (<3 hours/day) (although pt declining at this time)    Assistance Recommended at Discharge Frequent or constant Supervision/Assistance  Patient can return home with the following A lot of help with walking and/or transfers;A lot of help with bathing/dressing/bathroom;Help with stairs or ramp for entrance;Assistance with cooking/housework    Functional Status Assessment  Patient has had a recent decline in their functional status and demonstrates the ability to make significant improvements in function in a reasonable and predictable amount of time.  Equipment Recommendations          Precautions / Restrictions Precautions Precautions: Fall Restrictions Weight Bearing Restrictions: No      Mobility Bed Mobility Overal bed mobility: Needs Assistance Bed Mobility: Supine to Sit     Supine to sit: Min assist, HOB elevated     General bed mobility comments: Requires MIN A to bring hips towards EOB    Transfers Overall transfer level: Needs assistance Equipment used: Rolling walker (2 wheels) Transfers: Sit to/from Stand, Bed to chair/wheelchair/BSC Sit to Stand: Min assist     Step pivot transfers: Min assist     General transfer comment: pt requires momentum and MIN A for sit>stand. Requires cues for safe hand placement with RW use and for positioning body with recliner prior to reaching for armrests      Balance Overall balance assessment: Needs assistance Sitting-balance support: No upper extremity supported, Feet supported Sitting balance-Leahy Scale: Fair Sitting balance - Comments: Able to reach outside BOS to don/doff socks while seated in recliner however fatigues quickly   Standing balance support: Bilateral upper extremity supported, During functional activity, Reliant on assistive device  for balance Standing balance-Leahy Scale: Poor Standing balance comment:  Requires MIN A for step pivot transfers                           ADL either performed or assessed with clinical judgement   ADL Overall ADL's : Needs assistance/impaired     Grooming: Oral care;Set up;Sitting Grooming Details (indicate cue type and reason): requires set-up assist to apply oral gel to lips             Lower Body Dressing: Sitting/lateral leans;Minimal assistance Lower Body Dressing Details (indicate cue type and reason): to don/doff socks. Pt able to doff socks without physical assistance, however requires physical assist to don second sock in setting of fatigue                      Pertinent Vitals/Pain Pain Assessment Pain Assessment: Faces Faces Pain Scale: Hurts a little bit Pain Location: chest (during physical exertion) Pain Descriptors / Indicators: Discomfort, Pressure Pain Intervention(s): Limited activity within patient's tolerance, Monitored during session, Repositioned     Hand Dominance Right   Extremity/Trunk Assessment Upper Extremity Assessment Upper Extremity Assessment: Generalized weakness   Lower Extremity Assessment Lower Extremity Assessment: Generalized weakness       Communication Communication Communication: No difficulties   Cognition Arousal/Alertness: Awake/alert Behavior During Therapy: WFL for tasks assessed/performed Overall Cognitive Status: Within Functional Limits for tasks assessed                                 General Comments: Alert however fatigues quickly                Home Living Family/patient expects to be discharged to:: Private residence Living Arrangements: Alone Available Help at Discharge: Family;Friend(s);Available PRN/intermittently (She as a son and sister that can assist her at discharge.) Type of Home: Apartment Home Access: Level entry     Home Layout: One level     Bathroom Shower/Tub: Chief Strategy Officer: Standard     Home  Equipment: Information systems manager;Wheelchair - Careers adviser (comment);Toilet riser (3 wheeled walker)          Prior Functioning/Environment Prior Level of Function : Needs assist             Mobility Comments: pt ambulatory with 3 wheeled walker at baseline ADLs Comments: Pt reports that she is typically independent with ADLs, but occassionally assist for assist from son for donning socks/shoes. Son assists with IADLs (groceries, errands, etc)        OT Problem List: Decreased strength;Decreased activity tolerance;Impaired balance (sitting and/or standing)      OT Treatment/Interventions: Self-care/ADL training;Therapeutic exercise;Energy conservation;DME and/or AE instruction;Therapeutic activities;Patient/family education;Balance training    OT Goals(Current goals can be found in the care plan section) Acute Rehab OT Goals Patient Stated Goal: to go home OT Goal Formulation: With patient Time For Goal Achievement: May 30, 2021 Potential to Achieve Goals: Good ADL Goals Pt Will Perform Grooming: with min guard assist;standing Pt Will Perform Lower Body Dressing: with min guard assist;with adaptive equipment;sitting/lateral leans Pt Will Transfer to Toilet: with min guard assist;ambulating;bedside commode  OT Frequency: Min 2X/week       AM-PAC OT "6 Clicks" Daily Activity     Outcome Measure Help from another person eating meals?: None Help from another person taking care of personal grooming?: A Little Help from another  person toileting, which includes using toliet, bedpan, or urinal?: A Lot Help from another person bathing (including washing, rinsing, drying)?: A Lot Help from another person to put on and taking off regular upper body clothing?: A Little Help from another person to put on and taking off regular lower body clothing?: A Lot 6 Click Score: 16   End of Session Equipment Utilized During Treatment: Rolling walker (2 wheels);Oxygen Nurse Communication: Mobility  status  Activity Tolerance: Patient tolerated treatment well Patient left: in chair;with call bell/phone within reach  OT Visit Diagnosis: Unsteadiness on feet (R26.81);Muscle weakness (generalized) (M62.81)                Time: 5056-9794 OT Time Calculation (min): 29 min Charges:  OT General Charges $OT Visit: 1 Visit OT Evaluation $OT Eval Moderate Complexity: 1 Mod OT Treatments $Self Care/Home Management : 8-22 mins  Matthew Folks, OTR/L [facility] [phone]

## 2021-05-07 NOTE — Progress Notes (Signed)
Patient has only been able to consume about 5 cups of bowel prep left at side of bed. Patient tolerating bowel prep and encouraged to finish. Patient denies the need to have a bowel movement at the present. Patient is aware that she is NPO until her procedures are complete.

## 2021-05-07 NOTE — Assessment & Plan Note (Addendum)
Improved

## 2021-05-07 NOTE — Progress Notes (Signed)
IVT consulted for difficult PIV placement.  Upon arrival, pt is in chair.  RN will have pt placed back in bed and IVT will return in approx .

## 2021-05-07 NOTE — Care Plan (Signed)
Patient did not have any bowel movements as she only had minimal amount of prep. Will reschedule her procedures for tomorrow.  Merlyn Lot MD, MPH [facility] GI

## 2021-05-07 NOTE — Progress Notes (Signed)
°   05/07/21 1000  Clinical Encounter Type  Visited With Patient  Visit Type Initial;Other (Comment) (Advance Directive)  Advance Directives (For Healthcare)  Does Patient Have a Medical Advance Directive? No  Would patient like information on creating a medical advance directive? Yes (Inpatient - patient requests chaplain consult to create a medical advance directive) (Chaplain has given and explained AD. Chaplain will follow up)   Chaplain has given and explained AD and was asked by patient to follow up later. Chaplain will follow up again either this afternoon or tomorrow.

## 2021-05-07 NOTE — Plan of Care (Signed)
S[place] encouraged patient's continued participation with bowel prep. No BM this shift.   Problem: Education: Goal: Knowledge of General Education information will improve Description: Including pain rating scale, medication(s)/side effects and non-pharmacologic comfort measures Outcome: Progressing   Problem: Health Behavior/Discharge Planning: Goal: Ability to manage health-related needs will improve Outcome: Progressing   Problem: Clinical Measurements: Goal: Ability to maintain clinical measurements within normal limits will improve Outcome: Progressing Goal: Will remain free from infection Outcome: Progressing Goal: Diagnostic test results will improve Outcome: Progressing Goal: Respiratory complications will improve Outcome: Progressing Goal: Cardiovascular complication will be avoided Outcome: Progressing   Problem: Activity: Goal: Risk for activity intolerance will decrease Outcome: Progressing   Problem: Nutrition: Goal: Adequate nutrition will be maintained Outcome: Progressing   Problem: Coping: Goal: Level of anxiety will decrease Outcome: Progressing   Problem: Elimination: Goal: Will not experience complications related to bowel motility Outcome: Progressing Goal: Will not experience complications related to urinary retention Outcome: Progressing   Problem: Pain Managment: Goal: General experience of comfort will improve Outcome: Progressing   Problem: Safety: Goal: Ability to remain free from injury will improve Outcome: Progressing   Problem: Skin Integrity: Goal: Risk for impaired skin integrity will decrease Outcome: Progressing

## 2021-05-07 NOTE — Progress Notes (Addendum)
Patient began producing stool at ~ 2200 hrs. This RN spoke with the Pharmacist on staff who stated they were unable to resend additional bowel prep as there were no active orders for bowel prep. Bishop Limbo, NP was paged [facility] covering provider) at 2245 hrs to request new order for bowel prep. No new orders were entered and RN was directed to page GI team. Dr. Norma Fredrickson (GI Attending) was paged at 2315 hrs with a request for orders for bowel prep. No response has been received at the time of writing and the patient's stools are not clear for procedure.

## 2021-05-07 NOTE — Evaluation (Signed)
Physical Therapy Evaluation Patient Details Name: Jody Lowe MRN: [id number] DOB: 02-24-1946 Today's Date: 05/07/2021  History of Present Illness  The patient is a 76 year old female with history of iron deficiency anemia, previously on iron tablets, B12 deficiency anemia, generalized weakness, hyperlipidemia, depressive disorder, hypertension, tobacco use disorder, PAD, obesity, history of TIA, CKD 3A presented with concern for anxiety attack along with melena.   Clinical Impression  Patient wakes to voice/touch, does appear lethargy/sleepy for majority of session, but does improve over time. The patient stated at baseline she is modI in her apartment, uses a three wheeled walker for mobility, has family that checks in as able.   The patient was able to perform supine to sit with rails ,extended time and minA for trunk elevation and positioning into midline. Pt reliant on UE support initially for balance, did progress to supervision with feet supported. Sit <> Stand with RW and CGA-minA, pt needed momentum and several attempts to come up into standing. Step pivot to recliner, pt cued for safety (leaves RW outside base of support) and minA for eccentric control. Pt reported fatigue and declined further mobility.  Overall the patient demonstrated deficits (see "PT Problem List") that impede the patient's functional abilities, safety, and mobility and would benefit from skilled PT intervention. Recommendation is SNF due to current level of assistance needed and decline from PLOF.       Recommendations for follow up therapy are one component of a multi-disciplinary discharge planning process, led by the attending physician.  Recommendations may be updated based on patient status, additional functional criteria and insurance authorization.  Follow Up Recommendations Skilled nursing-short term rehab (<3 hours/day)    Assistance Recommended at Discharge    Patient can return home with the  following  A lot of help with walking and/or transfers;A lot of help with bathing/dressing/bathroom;Assistance with cooking/housework;Assist for transportation;Help with stairs or ramp for entrance    Equipment Recommendations Rolling walker (2 wheels)  Recommendations for Other Services       Functional Status Assessment Patient has had a recent decline in their functional status and demonstrates the ability to make significant improvements in function in a reasonable and predictable amount of time.     Precautions / Restrictions Precautions Precautions: Fall Restrictions Weight Bearing Restrictions: No      Mobility  Bed Mobility Overal bed mobility: Needs Assistance Bed Mobility: Supine to Sit     Supine to sit: Min assist, HOB elevated     General bed mobility comments: use of bed rails    Transfers Overall transfer level: Needs assistance Equipment used: Rolling walker (2 wheels) Transfers: Sit to/from Stand, Bed to chair/wheelchair/BSC Sit to Stand: Min guard, Min assist   Step pivot transfers: Min assist       General transfer comment: extended time, pt gathered momentum, PT stabilizing RW as needed    Ambulation/Gait                  Stairs            Wheelchair Mobility    Modified Rankin (Stroke Patients Only)       Balance Overall balance assessment: Needs assistance Sitting-balance support: Feet supported, Bilateral upper extremity supported Sitting balance-Leahy Scale: Fair     Standing balance support: Reliant on assistive device for balance Standing balance-Leahy Scale: Poor  Pertinent Vitals/Pain Pain Assessment Pain Assessment: No/denies pain    Home Living Family/patient expects to be discharged to:: Private residence Living Arrangements: Alone Available Help at Discharge: Family;Friend(s);Available PRN/intermittently Type of Home: Apartment Home Access: Level entry        Home Layout: One level Home Equipment: Shower seat;Wheelchair - Careers adviser (comment) (3 wheeled walker) Additional Comments: She as a son and sister that can assist her at discharge.    Prior Function Prior Level of Function : Needs assist             Mobility Comments: pt ambulatory with 3 wheeled walker at baseline, son assists with IADLs (groceries, errands, etc) ADLs Comments: modI per pt report     Hand Dominance   Dominant Hand: Right    Extremity/Trunk Assessment   Upper Extremity Assessment Upper Extremity Assessment: Generalized weakness    Lower Extremity Assessment Lower Extremity Assessment: Generalized weakness    Cervical / Trunk Assessment Cervical / Trunk Assessment: Kyphotic  Communication   Communication: No difficulties  Cognition Arousal/Alertness: Lethargic Behavior During Therapy: WFL for tasks assessed/performed Overall Cognitive Status: Within Functional Limits for tasks assessed                                 General Comments: pt did become more alert as session progressed but did remain sleepy        General Comments      Exercises     Assessment/Plan    PT Assessment Patient needs continued PT services  PT Problem List Decreased strength;Decreased mobility;Decreased range of motion;Decreased knowledge of precautions;Decreased activity tolerance;Decreased balance;Decreased knowledge of use of DME       PT Treatment Interventions DME instruction;Therapeutic exercise;Gait training;Balance training;Stair training;Neuromuscular re-education;Functional mobility training;Therapeutic activities;Patient/family education    PT Goals (Current goals can be found in the Care Plan section)  Acute Rehab PT Goals Patient Stated Goal: to go home PT Goal Formulation: With patient Time For Goal Achievement: 05/26/2021 Potential to Achieve Goals: Good    Frequency Min 2X/week     Co-evaluation               AM-PAC  PT "6 Clicks" Mobility  Outcome Measure Help needed turning from your back to your side while in a flat bed without using bedrails?: A Lot Help needed moving from lying on your back to sitting on the side of a flat bed without using bedrails?: A Lot Help needed moving to and from a bed to a chair (including a wheelchair)?: A Little Help needed standing up from a chair using your arms (e.g., wheelchair or bedside chair)?: A Little Help needed to walk in hospital room?: A Little Help needed climbing 3-5 steps with a railing? : Total 6 Click Score: 14    End of Session Equipment Utilized During Treatment: Gait belt Activity Tolerance: Patient limited by lethargy Patient left: in chair;with call bell/phone within reach Nurse Communication: Mobility status PT Visit Diagnosis: Other abnormalities of gait and mobility (R26.89);Difficulty in walking, not elsewhere classified (R26.2);Muscle weakness (generalized) (M62.81)    Time: 5537-4827 PT Time Calculation (min) (ACUTE ONLY): 35 min   Charges:   PT Evaluation $PT Eval Low Complexity: 1 Low PT Treatments $Therapeutic Activity: 23-37 mins        Olga Coaster PT, DPT 11:18 AM,05/07/21

## 2021-05-07 NOTE — Assessment & Plan Note (Addendum)
-   Possibly reactive.  No signs of infection.  WBC count trended down to normal, blood culture NGTD

## 2021-05-08 ENCOUNTER — Inpatient Hospital Stay: Payer: Medicare Other

## 2021-05-08 ENCOUNTER — Encounter: Payer: Self-pay | Admitting: Internal Medicine

## 2021-05-08 ENCOUNTER — Inpatient Hospital Stay: Payer: Medicare Other | Admitting: Anesthesiology

## 2021-05-08 ENCOUNTER — Encounter
Admission: EM | Disposition: A | Discharge status: Skilled Nursing Facility | Payer: Self-pay | Source: Home / Self Care | Attending: Student

## 2021-05-08 ENCOUNTER — Ambulatory Visit: Payer: Medicare Other

## 2021-05-08 DIAGNOSIS — E538 Deficiency of other specified B group vitamins: Secondary | ICD-10-CM | POA: Diagnosis not present

## 2021-05-08 DIAGNOSIS — N179 Acute kidney failure, unspecified: Secondary | ICD-10-CM | POA: Diagnosis not present

## 2021-05-08 DIAGNOSIS — D649 Anemia, unspecified: Secondary | ICD-10-CM | POA: Diagnosis not present

## 2021-05-08 DIAGNOSIS — E8721 Acute metabolic acidosis: Secondary | ICD-10-CM | POA: Diagnosis not present

## 2021-05-08 HISTORY — PX: COLONOSCOPY WITH PROPOFOL: SHX5780

## 2021-05-08 HISTORY — PX: ESOPHAGOGASTRODUODENOSCOPY: SHX5428

## 2021-05-08 LAB — CBC WITH DIFFERENTIAL/PLATELET
Abs Immature Granulocytes: 0.17 K/uL — ABNORMAL HIGH (ref 0.00–0.07)
Basophils Absolute: 0.1 K/uL (ref 0.0–0.1)
Basophils Relative: 0 %
Eosinophils Absolute: 0 K/uL (ref 0.0–0.5)
Eosinophils Relative: 0 %
HCT: 26.8 % — ABNORMAL LOW (ref 36.0–46.0)
Hemoglobin: 8.4 g/dL — ABNORMAL LOW (ref 12.0–15.0)
Immature Granulocytes: 1 %
Lymphocytes Relative: 5 %
Lymphs Abs: 0.9 K/uL (ref 0.7–4.0)
MCH: 24.3 pg — ABNORMAL LOW (ref 26.0–34.0)
MCHC: 31.3 g/dL (ref 30.0–36.0)
MCV: 77.5 fL — ABNORMAL LOW (ref 80.0–100.0)
Monocytes Absolute: 1.8 K/uL — ABNORMAL HIGH (ref 0.1–1.0)
Monocytes Relative: 9 %
Neutro Abs: 16.5 K/uL — ABNORMAL HIGH (ref 1.7–7.7)
Neutrophils Relative %: 85 %
Platelets: 486 K/uL — ABNORMAL HIGH (ref 150–400)
RBC: 3.46 MIL/uL — ABNORMAL LOW (ref 3.87–5.11)
RDW: 21.7 % — ABNORMAL HIGH (ref 11.5–15.5)
WBC: 19.4 K/uL — ABNORMAL HIGH (ref 4.0–10.5)
nRBC: 0.2 % (ref 0.0–0.2)

## 2021-05-08 LAB — BASIC METABOLIC PANEL
Anion gap: 4 — ABNORMAL LOW (ref 5–15)
BUN: 24 mg/dL — ABNORMAL HIGH (ref 8–23)
CO2: 23 mmol/L (ref 22–32)
Calcium: 8.9 mg/dL (ref 8.9–10.3)
Chloride: 107 mmol/L (ref 98–111)
Creatinine, Ser: 1.25 mg/dL — ABNORMAL HIGH (ref 0.44–1.00)
GFR, Estimated: 44 mL/min — ABNORMAL LOW (ref >60)
Glucose, Bld: 99 mg/dL (ref 70–99)
Potassium: 4.1 mmol/L (ref 3.5–5.1)
Sodium: 134 mmol/L — ABNORMAL LOW (ref 135–145)

## 2021-05-08 LAB — MAGNESIUM: Magnesium: 2.2 mg/dL (ref 1.7–2.4)

## 2021-05-08 SURGERY — COLONOSCOPY WITH PROPOFOL
Anesthesia: General

## 2021-05-08 SURGERY — ESOPHAGOGASTRODUODENOSCOPY (EGD) WITH PROPOFOL
Anesthesia: Monitor Anesthesia Care

## 2021-05-08 MED ORDER — PROPOFOL 10 MG/ML IV BOLUS
INTRAVENOUS | Status: DC | PRN
  Administered 2021-05-08: 20 mg via INTRAVENOUS

## 2021-05-08 MED ORDER — PHENYLEPHRINE HCL (PRESSORS) 10 MG/ML IV SOLN
INTRAVENOUS | Status: DC | PRN
Start: 2021-05-08 — End: 2021-05-08
  Administered 2021-05-08 (×2): 80 ug via INTRAVENOUS

## 2021-05-08 MED ORDER — SODIUM CHLORIDE 0.9 % IV SOLN
INTRAVENOUS | Status: DC
  Administered 2021-05-08: 1000 mL via INTRAVENOUS

## 2021-05-08 MED ORDER — PROPOFOL 500 MG/50ML IV EMUL
INTRAVENOUS | Status: DC | PRN
  Administered 2021-05-08: 150 ug/kg/min via INTRAVENOUS

## 2021-05-08 MED ORDER — PEG 3350-KCL-NA BICARB-NACL 420 G PO SOLR
4000.0000 mL | Freq: Once | ORAL | Status: AC
  Administered 2021-05-08: 4000 mL via ORAL
  Filled 2021-05-08: qty 4000

## 2021-05-08 NOTE — Progress Notes (Signed)
OT Cancellation Note  Patient Details Name: Jody Lowe MRN: [id number] DOB: [birth year]   Cancelled Treatment:    Reason Eval/Treat Not Completed: Patient at procedure or test/ unavailable. Pt currently off the floor for EGD. OT to re-attempt at later time/date as able.   Matthew Folks, OTR/L [facility] [phone]

## 2021-05-08 NOTE — Anesthesia Preprocedure Evaluation (Addendum)
Anesthesia Evaluation  Patient identified by MRN, date of birth, ID band Patient awake    Reviewed: Allergy & Precautions, NPO status , Patient's Chart, lab work & pertinent test results  History of Anesthesia Complications Negative for: history of anesthetic complications  Airway Mallampati: III  TM Distance: >3 FB Neck ROM: full    Dental  (+) Edentulous Upper, Edentulous Lower   Pulmonary shortness of breath, neg sleep apnea, neg COPD, Current Smoker and Patient abstained from smoking.,    Pulmonary exam normal breath sounds clear to auscultation       Cardiovascular Exercise Tolerance: Poor METS: 3 - Mets hypertension, + Peripheral Vascular Disease (s/p fem-tib bypass 2022)  (-) CAD and (-) Past MI Normal cardiovascular exam(-) dysrhythmias  Rhythm:Regular Rate:Normal  Troponemia - Troponin 475 on presentation, did not trend upwards and repeat troponin was 396  EKG 05/05/21: Normal sinus rhythm Cannot rule out Anterior infarct , age undetermined Marked ST abnormality, possible inferior subendocardial injury Abnormal ECG When compared with ECG of 19-Oct-2020 23:23, Minimal criteria for Anterior infarct are now Present ST now depressed in Inferior leads ST now depressed in Anterolateral leads T wave inversion now evident in Inferior leads T wave inversion now evident in Anterolateral leads ...  ECHO 7/22: 1. Left ventricular ejection fraction, by estimation, is 60 to 65%. The  left ventricle has normal function. The left ventricle has no regional  wall motion abnormalities. Left ventricular diastolic parameters are  consistent with Grade I diastolic  dysfunction (impaired relaxation).  2. Right ventricular systolic function is normal. The right ventricular  size is normal. There is normal pulmonary artery systolic pressure.  3. The mitral valve is normal in structure. Mild mitral valve  regurgitation. No evidence of  mitral stenosis.  4. The aortic valve is normal in structure. Aortic valve regurgitation is  not visualized. No aortic stenosis is present.  5. The inferior vena cava is normal in size with greater than 50%  respiratory variability, suggesting right atrial pressure of 3 mmHg.   Neuro/Psych PSYCHIATRIC DISORDERS Depression TIA   GI/Hepatic neg GERD  ,(+)     (-) substance abuse  , Elevated LFTs IDA & MELENA   Endo/Other  negative endocrine ROSneg diabetes  Renal/GU CRFRenal disease  negative genitourinary   Musculoskeletal   Abdominal (+) + obese,   Peds  Hematology  (+) Blood dyscrasia, anemia , H/o B12 deficiency anemia Acute on chronic severe anemia  -Hemoglobin 2.9 on admission.  Status post 4 units packed red cell transfusion  Thrombocytosis Leukocytosis   Anesthesia Other Findings  Generalized weakness Past Medical History: No date: Anemia No date: Hypertension No date: Peripheral artery disease (HCC) No date: Renal disorder  Past Surgical History: No date: BREAST REDUCTION SURGERY No date: CARDIAC CATHETERIZATION 11/11/2019: CATARACT EXTRACTION W/PHACO; Left     Comment:  Procedure: CATARACT EXTRACTION PHACO AND INTRAOCULAR               LENS PLACEMENT ([place]) LEFT VISION BLUE;  Surgeon: Marchia Meiers, MD;  Location: [place];  Service:               Ophthalmology;  Laterality: Left;  7.94 0:42.4 12/23/2019: CATARACT EXTRACTION W/PHACO; Right     Comment:  Procedure: CATARACT EXTRACTION PHACO AND INTRAOCULAR               LENS PLACEMENT ([facility]) RIGHT VISION BLUE 5.47  00:31.1;  Surgeon: Marchia Meiers, MD;  Location: [place];  Service: Ophthalmology;  Laterality: Right; 01/03/2021: FEMORAL-TIBIAL BYPASS GRAFT; Right     Comment:  Procedure: BYPASS GRAFT FEMORAL-TIBIAL ARTERY;  Surgeon:              Algernon Huxley, MD;  Location: [facility];  Service:               Vascular;  Laterality:  Right; 10/16/2020: LOWER EXTREMITY ANGIOGRAPHY; N/A     Comment:  Procedure: Lower Extremity Angiography;  Surgeon: Algernon Huxley, MD;  Location: [place] [facility];  Service:               Cardiovascular;  Laterality: N/A; 10/23/2020: LOWER EXTREMITY ANGIOGRAPHY; Right     Comment:  Procedure: LOWER EXTREMITY ANGIOGRAPHY;  Surgeon: Algernon Huxley, MD;  Location: [place] [facility];  Service:               Cardiovascular;  Laterality: Right; 01/03/2021: VEIN HARVEST; Right     Comment:  Procedure: VEIN HARVEST;  Surgeon: Algernon Huxley, MD;                Location: [facility];  Service: Vascular;  Laterality:               Right;  BMI    Body Mass Index: 32.28 kg/m      Reproductive/Obstetrics negative OB ROS                           Anesthesia Physical Anesthesia Plan  ASA: 3  Anesthesia Plan: General   Post-op Pain Management: Minimal or no pain anticipated   Induction: Intravenous  PONV Risk Score and Plan: 2 and Propofol infusion and TIVA  Airway Management Planned: Simple Face Mask and Natural Airway  Additional Equipment: None  Intra-op Plan:   Post-operative Plan:   Informed Consent: I have reviewed the patients History and Physical, chart, labs and discussed the procedure including the risks, benefits and alternatives for the proposed anesthesia with the patient or authorized representative who has indicated his/her understanding and acceptance.     Dental advisory given  Plan Discussed with: Anesthesiologist, CRNA and Surgeon  Anesthesia Plan Comments: (Discussed risks of anesthesia with patient, including possibility of difficulty with spontaneous ventilation under anesthesia necessitating airway intervention, PONV, and rare risks such as cardiac or respiratory or neurological events, and allergic reactions. Discussed the role of CRNA in patient's perioperative care. Patient understands.)      Anesthesia  Quick Evaluation

## 2021-05-08 NOTE — Anesthesia Procedure Notes (Signed)
Date/Time: 05/08/2021 3:09 PM Performed by: Junious Silk, CRNA Pre-anesthesia Checklist: Patient identified, Emergency Drugs available, Suction available, Patient being monitored and Timeout performed Oxygen Delivery Method: Nasal cannula

## 2021-05-08 NOTE — Progress Notes (Signed)
Patient returned at this time.  No s/s of distress noted.  Denies pain.  Noted transfer orders for med surg.  Will call son and let him know she is back on unit.

## 2021-05-08 NOTE — Transfer of Care (Signed)
Immediate Anesthesia Transfer of Care Note  Patient: Jody Lowe  Procedure(s) Performed: COLONOSCOPY WITH PROPOFOL ESOPHAGOGASTRODUODENOSCOPY (EGD)  Patient Location: PACU  Anesthesia Type:General  Level of Consciousness: sedated  Airway & Oxygen Therapy: Patient Spontanous Breathing and Patient connected to nasal cannula oxygen  Post-op Assessment: Report given to RN and Post -op Vital signs reviewed and stable  Post vital signs: Reviewed and stable  Last Vitals:  Vitals Value Taken Time  BP 95/42 05/08/21 1529  Temp    Pulse 90 05/08/21 1531  Resp 13 05/08/21 1531  SpO2 95 % 05/08/21 1531  Vitals shown include unvalidated device data.  Last Pain:  Vitals:   05/08/21 1349  TempSrc:   PainSc: 0-No pain      Patients Stated Pain Goal: 0 ([id number])  Complications: No notable events documented.

## 2021-05-08 NOTE — Progress Notes (Signed)
Progress Note   Patient: Jody Lowe MEQ:[id number] DOB: 27-Jan-1946 DOA: 05/05/2021     3 DOS: the patient was seen and examined on 05/08/2021   Brief hospital course: 76 year old female with history of iron deficiency anemia, previously on iron tablets, B12 deficiency anemia, generalized weakness, hyperlipidemia, depressive disorder, hypertension, tobacco use disorder, PAD, obesity, history of TIA, CKD 3A presented with concern for anxiety attack along with melena for several years.  On presentation, sodium was 129, creatinine of 1.60, bicarb of 13, WBC of 9, hemoglobin of 2.9 with hematocrit of 10.9 and platelets of 638.  High-sensitivity troponin was 475.  She received packed red cell transfusion.  She was started on IV Protonix and GI was consulted.  Assessment and Plan: * Acute anemia- (present on admission) Acute on chronic severe anemia -Hemoglobin 2.9 on admission.  Status post 4 units packed red cell transfusion.  Hemoglobin 8.4 today. Monitor H&H.  GI bleed- (present on admission) -Patient apparently has had melena for years - GI has rescheduled EGD and colonoscopy for today since he did not have bowel movements yesterday.  Continue IV Protonix.  N.p.o. for now.  Hyponatremia- (present on admission) - Possibly from poor oral intake.  Sodium improving to 134 today.  Off IV fluids.  Repeat a.m. labs.  AKI (acute kidney injury) (HCC)- (present on admission) - Possibly prerenal.  Creatinine improving.  Repeat a.m. labs  Elevated troponin- (present on admission) - Possibly from demand ischemia from anemia.  Troponin 475 on presentation, did not trend upwards and repeat troponin was 396. -No chest pain.  No further work-up needed.  B12 deficiency- (present on admission) - Continue supplementation.  Hyperlipidemia- (present on admission) - Hold statin due to elevated LFTs  Depressive disorder- (present on admission) - Continue home regimen.  Outpatient follow-up with PCP or  psychiatry  Generalized weakness - PT recommends SNF placement.  Social worker consult.  Acute metabolic acidosis- (present on admission) - Possibly from AKI and poor oral intake. -Resolved  Elevated LFTs- (present on admission) - Questionable cause.  Right upper quadrant ultrasound showed liver appearance suspicious for cirrhosis but no masses along with trace ascites.  LFTs pending for today.  Repeat a.m. LFTs.  Follow GI recommendations.  Hepatitis panel negative. -Statin on hold  Leukocytosis - Possibly reactive.  White count still going up.  No signs of infection.  We will check blood cultures, chest x-ray and UA.  Repeat a.m. labs.  Hypokalemia - Improved  Thrombocytosis- (present on admission) - Possibly reactive.  Monitor  Hypertension, benign- (present on admission) - Blood pressure intermittently on the lower side..  Monitor.        Subjective:  Patient seen and examined at bedside.  Poor historian.  No chest pain, fever, worsening shortness of breath reported.  Still feels very weak.  Physical Exam: Vitals:   05/08/21 0600 05/08/21 0700 05/08/21 0800 05/08/21 0815  BP: (!) 99/58 121/69 (!) 114/57   Pulse: 87 93  87  Resp: 13 (!) 23 13 13   Temp:      TempSrc:      SpO2: 100% 100%  99%  Weight:      Height:       General: No acute distress.  Still on 2 L oxygen via nasal cannula.  Looks chronically ill and deconditioned.  Elderly female lying in bed. ENT/neck: No elevated JVD.  No palpable thyromegaly. respiratory: Bilateral decreased breath sounds at bases with scattered crackles with intermittent tachypnea  CVS: S1-S2 heard; rate  controlled currently  abdominal: Soft, nontender, mildly distended; normal bowel sounds are heard no organomegaly; normal bowel sounds are heard extremities: No cyanosis; trace lower extremity edema present  CNS: Awake; very slow to respond.  Poor historian.  No focal neurologic deficit.  Moving extremities  lymph: No cervical  lymphadenopathy Skin: No obvious petechiae/lesions  psych: Affect is flat.  No signs of agitation.   Musculoskeletal: No obvious joint tenderness or deformity  Data Reviewed: I have reviewed investigations myself including this morning's sodium of 134, creatinine of 1.25, potassium of 4.1, magnesium of 2.2, WBCs of 19.4, hemoglobin 8.4, platelets of 486   Family Communication: None at bedside  Disposition: Status is: Inpatient Remains inpatient appropriate because: Of need for GI intervention.  Will need SNF placement    Planned Discharge Destination: Skilled nursing facility     Time spent: 50 minutes  Author: Aline August, MD 05/08/2021 10:30 AM  For on call review [email or web address].

## 2021-05-08 NOTE — Op Note (Signed)
[facility] Patient Name: Jody Lowe Procedure Date: 05/08/2021 2:47 PM MRN: [id number] Account #: [id number] Date of Birth: December 18, 1945 Admit Type: Outpatient Age: 76 Room: [facility] ENDO ROOM 1 Gender: Female Note Status: Finalized Instrument Name: Altamese Cabal Endoscope [id number] Procedure:             Upper GI endoscopy Indications:           Melena Providers:             Andrey Farmer MD, MD Medicines:             Monitored Anesthesia Care Complications:         No immediate complications. Procedure:             Pre-Anesthesia Assessment:                        - Prior to the procedure, a History and Physical was                         performed, and patient medications and allergies were                         reviewed. The patient is competent. The risks and                         benefits of the procedure and the sedation options and                         risks were discussed with the patient. All questions                         were answered and informed consent was obtained.                         Patient identification and proposed procedure were                         verified by the physician, the nurse, the                         anesthesiologist, the anesthetist and the technician                         in the endoscopy suite. Mental Status Examination:                         alert and oriented. Airway Examination: normal                         oropharyngeal airway and neck mobility. Respiratory                         Examination: clear to auscultation. CV Examination:                         normal. Prophylactic Antibiotics: The patient does not                         require prophylactic antibiotics. Prior  Anticoagulants: The patient has taken Eliquis                         (apixaban), last dose was 3 days prior to procedure.                         ASA Grade Assessment: III - A patient with severe                          systemic disease. After reviewing the risks and                         benefits, the patient was deemed in satisfactory                         condition to undergo the procedure. The anesthesia                         plan was to use monitored anesthesia care (MAC).                         Immediately prior to administration of medications,                         the patient was re-assessed for adequacy to receive                         sedatives. The heart rate, respiratory rate, oxygen                         saturations, blood pressure, adequacy of pulmonary                         ventilation, and response to care were monitored                         throughout the procedure. The physical status of the                         patient was re-assessed after the procedure.                        After obtaining informed consent, the endoscope was                         passed under direct vision. Throughout the procedure,                         the patient's blood pressure, pulse, and oxygen                         saturations were monitored continuously. The Endoscope                         was introduced through the mouth, and advanced to the                         second part of duodenum. The upper GI  endoscopy was                         accomplished without difficulty. The patient tolerated                         the procedure well. Findings:      The examined esophagus was normal.      The entire examined stomach was normal.      The examined duodenum was normal. Impression:            - Normal esophagus.                        - Normal stomach.                        - Normal examined duodenum.                        - No specimens collected. Recommendation:        - Perform a colonoscopy today. Procedure Code(s):     --- Professional ---                        [phone], Esophagogastroduodenoscopy, flexible,                         transoral;  diagnostic, including collection of                         specimen(s) by brushing or washing, when performed                         (separate procedure) Diagnosis Code(s):     --- Professional ---                        K92.1, Melena (includes Hematochezia) CPT copyright 2019 American Medical Association. All rights reserved. The codes documented in this report are preliminary and upon coder review may  be revised to meet current compliance requirements. Andrey Farmer MD, MD 05/08/2021 3:28:41 PM Number of Addenda: 0 Note Initiated On: 05/08/2021 2:47 PM Estimated Blood Loss:  Estimated blood loss: none.      [facility]

## 2021-05-08 NOTE — Plan of Care (Signed)
  Problem: Health Behavior/Discharge Planning: Goal: Ability to manage health-related needs will improve Outcome: Progressing   Problem: Clinical Measurements: Goal: Diagnostic test results will improve Outcome: Progressing   

## 2021-05-08 NOTE — Progress Notes (Signed)
Procedure consent signed at this time for EGD and colonoscopy.  Patient transported off unit with transport down to endo suite at this time for procedure.  Hand off report given.  No s/s of distress noted denies pain.

## 2021-05-08 NOTE — Progress Notes (Signed)
PT Cancellation Note  Patient Details Name: Jody Lowe MRN: [id number] DOB: 1945-06-19   Cancelled Treatment:    Reason Eval/Treat Not Completed: Patient at procedure or test/unavailable Pt has been out of room for endo/colonoscopy most of the afternoon, PT unable to work with pt  this date.  Kreg Shropshire, DPT 05/08/2021, 3:40 PM

## 2021-05-08 NOTE — Care Plan (Signed)
EGD negative. Colonoscopy with extremely poor prep so aborted at ascending colon but no blood seen. 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 Ok to resume solid diet. Benefits of restarting anticoagulation outweigh the risks. Will need IV iron replacement and PO iron at discharge with close follow-up after discharge.  Merlyn Lot MD, MPH [facility]

## 2021-05-08 NOTE — Op Note (Signed)
[facility]astro[facility] Patient Name: Jody Lowe Procedure Date: 05/08/2021 3:05 PM MRN: [id number] Account #: [id number] Date of Birth: 12-09-1945 Admit Type: Inpatient Age: 76 Room: [facility] ENDO ROOM 1 Gender: Female Note Status: Finalized Instrument Name: Jasper Riling [id number] Procedure:             Colonoscopy Indications:           Melena Providers:             Andrey Farmer MD, MD Medicines:             Monitored Anesthesia Care Complications:         No immediate complications. Procedure:             Pre-Anesthesia Assessment:                        - Prior to the procedure, a History and Physical was                         performed, and patient medications and allergies were                         reviewed. The patient is competent. The risks and                         benefits of the procedure and the sedation options and                         risks were discussed with the patient. All questions                         were answered and informed consent was obtained.                         Patient identification and proposed procedure were                         verified by the physician, the nurse, the                         anesthesiologist, the anesthetist and the technician                         in the endoscopy suite. Mental Status Examination:                         alert and oriented. Airway Examination: normal                         oropharyngeal airway and neck mobility. Respiratory                         Examination: clear to auscultation. CV Examination:                         normal. Prophylactic Antibiotics: The patient does not                         require prophylactic antibiotics. Prior  Anticoagulants: The patient has taken Eliquis                         (apixaban), last dose was 3 days prior to procedure.                         ASA Grade Assessment: III - A patient with severe                          systemic disease. After reviewing the risks and                         benefits, the patient was deemed in satisfactory                         condition to undergo the procedure. The anesthesia                         plan was to use monitored anesthesia care (MAC).                         Immediately prior to administration of medications,                         the patient was re-assessed for adequacy to receive                         sedatives. The heart rate, respiratory rate, oxygen                         saturations, blood pressure, adequacy of pulmonary                         ventilation, and response to care were monitored                         throughout the procedure. The physical status of the                         patient was re-assessed after the procedure.                        After obtaining informed consent, the colonoscope was                         passed under direct vision. Throughout the procedure,                         the patient's blood pressure, pulse, and oxygen                         saturations were monitored continuously. The                         Colonoscope was introduced through the anus with the                         intention of advancing to the cecum. The scope was  advanced to the ascending colon before the procedure                         was aborted. Medications were given. The colonoscopy                         was aborted due to poor bowel prep. The colonoscopy                         was technically difficult and complex due to poor                         bowel prep. The patient tolerated the procedure well.                         The quality of the bowel preparation was poor. Findings:      The perianal and digital rectal examinations were normal.      A moderate amount of stool was found in the entire colon, precluding       visualization. Impression:            - The procedure was aborted  due to poor bowel prep.                        - Preparation of the colon was poor.                        - Stool in the entire examined colon.                        - No specimens collected. Recommendation:        - Return patient to hospital ward for ongoing care.                        - Advance diet as tolerated.                        - Will likely need outpatient evaluation. 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 No large lesions                         seen on colonoscopy. Recommend monitoring hemoglobin.                         Given history of PVD the benefits of restarting                         anticoagulation likely outweigh any risks. Procedure Code(s):     --- Professional ---                        [phone], 53, Colonoscopy, flexible; diagnostic,                         including collection of specimen(s) by brushing or  washing, when performed (separate procedure) Diagnosis Code(s):     --- Professional ---                        K92.1, Melena (includes Hematochezia) CPT copyright 2019 American Medical Association. All rights reserved. The codes documented in this report are preliminary and upon coder review may  be revised to meet current compliance requirements. Andrey Farmer MD, MD 05/08/2021 3:32:47 PM Number of Addenda: 0 Note Initiated On: 05/08/2021 3:05 PM Total Procedure Duration: 0 hours 15 minutes 8 seconds  Estimated Blood Loss:  Estimated blood loss: none.      [facility]

## 2021-05-08 NOTE — Progress Notes (Signed)
New orders acknowledged for bowel prep. Continuing to  encourage the patient's participation in bowel prep.

## 2021-05-08 NOTE — Anesthesia Postprocedure Evaluation (Signed)
Anesthesia Post Note  Patient: Jody Lowe  Procedure(s) Performed: COLONOSCOPY WITH PROPOFOL ESOPHAGOGASTRODUODENOSCOPY (EGD)  Patient location during evaluation: Endoscopy Anesthesia Type: General Level of consciousness: awake and alert Pain management: pain level controlled Vital Signs Assessment: post-procedure vital signs reviewed and stable Respiratory status: spontaneous breathing, nonlabored ventilation, respiratory function stable and patient connected to nasal cannula oxygen Cardiovascular status: blood pressure returned to baseline and stable Postop Assessment: no apparent nausea or vomiting Anesthetic complications: no   No notable events documented.   Last Vitals:  Vitals:   05/08/21 1531 05/08/21 1550  BP:  114/68  Pulse:    Resp:    Temp: (!) 35.9 C   SpO2:      Last Pain:  Vitals:   05/08/21 1550  TempSrc:   PainSc: 0-No pain                 Corinda Gubler

## 2021-05-09 ENCOUNTER — Encounter: Payer: Self-pay | Admitting: Gastroenterology

## 2021-05-09 ENCOUNTER — Inpatient Hospital Stay: Payer: Medicare Other

## 2021-05-09 DIAGNOSIS — E538 Deficiency of other specified B group vitamins: Secondary | ICD-10-CM

## 2021-05-09 LAB — TYPE AND SCREEN
ABO/RH(D): O POS
Antibody Screen: POSITIVE
Donor AG Type: NEGATIVE
Donor AG Type: NEGATIVE
Donor AG Type: NEGATIVE
Donor AG Type: NEGATIVE
Unit division: 0
Unit division: 0
Unit division: 0
Unit division: 0
Unit division: 0
Unit division: 0
Unit division: 0
Unit division: 0

## 2021-05-09 LAB — TROPONIN I (HIGH SENSITIVITY)
Troponin I (High Sensitivity): 63 ng/L — ABNORMAL HIGH (ref <18)
Troponin I (High Sensitivity): 65 ng/L — ABNORMAL HIGH (ref <18)

## 2021-05-09 LAB — CBC WITH DIFFERENTIAL/PLATELET
Abs Immature Granulocytes: 0.1 10*3/uL — ABNORMAL HIGH (ref 0.00–0.07)
Basophils Absolute: 0.1 10*3/uL (ref 0.0–0.1)
Basophils Relative: 1 %
Eosinophils Absolute: 0 10*3/uL (ref 0.0–0.5)
Eosinophils Relative: 0 %
HCT: 26.1 % — ABNORMAL LOW (ref 36.0–46.0)
Hemoglobin: 8.2 g/dL — ABNORMAL LOW (ref 12.0–15.0)
Immature Granulocytes: 1 %
Lymphocytes Relative: 8 %
Lymphs Abs: 1 10*3/uL (ref 0.7–4.0)
MCH: 24.2 pg — ABNORMAL LOW (ref 26.0–34.0)
MCHC: 31.4 g/dL (ref 30.0–36.0)
MCV: 77 fL — ABNORMAL LOW (ref 80.0–100.0)
Monocytes Absolute: 1.2 10*3/uL — ABNORMAL HIGH (ref 0.1–1.0)
Monocytes Relative: 9 %
Neutro Abs: 11 10*3/uL — ABNORMAL HIGH (ref 1.7–7.7)
Neutrophils Relative %: 81 %
Platelets: 506 10*3/uL — ABNORMAL HIGH (ref 150–400)
RBC: 3.39 MIL/uL — ABNORMAL LOW (ref 3.87–5.11)
RDW: 22.2 % — ABNORMAL HIGH (ref 11.5–15.5)
WBC: 13.3 10*3/uL — ABNORMAL HIGH (ref 4.0–10.5)
nRBC: 0.3 % — ABNORMAL HIGH (ref 0.0–0.2)

## 2021-05-09 LAB — COMPREHENSIVE METABOLIC PANEL
ALT: 125 U/L — ABNORMAL HIGH (ref 0–44)
AST: 31 U/L (ref 15–41)
Albumin: 2.6 g/dL — ABNORMAL LOW (ref 3.5–5.0)
Alkaline Phosphatase: 143 U/L — ABNORMAL HIGH (ref 38–126)
Anion gap: 7 (ref 5–15)
BUN: 23 mg/dL (ref 8–23)
CO2: 20 mmol/L — ABNORMAL LOW (ref 22–32)
Calcium: 8.9 mg/dL (ref 8.9–10.3)
Chloride: 105 mmol/L (ref 98–111)
Creatinine, Ser: 1.06 mg/dL — ABNORMAL HIGH (ref 0.44–1.00)
GFR, Estimated: 54 mL/min — ABNORMAL LOW (ref >60)
Glucose, Bld: 98 mg/dL (ref 70–99)
Potassium: 3.8 mmol/L (ref 3.5–5.1)
Sodium: 132 mmol/L — ABNORMAL LOW (ref 135–145)
Total Bilirubin: 0.8 mg/dL (ref 0.3–1.2)
Total Protein: 6.4 g/dL — ABNORMAL LOW (ref 6.5–8.1)

## 2021-05-09 LAB — BPAM RBC
Blood Product Expiration Date: 202303072359
Blood Product Expiration Date: 202303072359
Blood Product Expiration Date: 202303112359
Blood Product Expiration Date: 202303112359
Blood Product Expiration Date: 202303112359
Blood Product Expiration Date: 202303112359
Blood Product Expiration Date: 202303162359
Blood Product Expiration Date: 202303162359
ISSUE DATE / TIME: 202302112056
ISSUE DATE / TIME: 202302120041
ISSUE DATE / TIME: 202302120540
ISSUE DATE / TIME: 202302120955
Unit Type and Rh: 5100
Unit Type and Rh: 5100
Unit Type and Rh: 5100
Unit Type and Rh: 5100
Unit Type and Rh: 5100
Unit Type and Rh: 5100
Unit Type and Rh: 5100
Unit Type and Rh: 5100

## 2021-05-09 LAB — VITAMIN B12: Vitamin B-12: 1350 pg/mL — ABNORMAL HIGH (ref 180–914)

## 2021-05-09 LAB — BRAIN NATRIURETIC PEPTIDE: B Natriuretic Peptide: 2589.6 pg/mL — ABNORMAL HIGH (ref 0.0–100.0)

## 2021-05-09 LAB — FOLATE: Folate: 5.8 ng/mL — ABNORMAL LOW (ref >5.9)

## 2021-05-09 LAB — MAGNESIUM: Magnesium: 1.9 mg/dL (ref 1.7–2.4)

## 2021-05-09 LAB — IRON AND TIBC
Iron: 14 ug/dL — ABNORMAL LOW (ref 28–170)
Saturation Ratios: 4 % — ABNORMAL LOW (ref 10.4–31.8)
TIBC: 340 ug/dL (ref 250–450)
UIBC: 326 ug/dL

## 2021-05-09 MED ORDER — PANTOPRAZOLE SODIUM 40 MG IV SOLR
40.0000 mg | Freq: Two times a day (BID) | INTRAVENOUS | Status: DC
  Administered 2021-05-09: 40 mg via INTRAVENOUS
  Filled 2021-05-09: qty 10

## 2021-05-09 MED ORDER — ASPIRIN EC 81 MG PO TBEC
81.0000 mg | DELAYED_RELEASE_TABLET | Freq: Every day | ORAL | Status: DC
  Administered 2021-05-09 – 2021-05-12 (×4): 81 mg via ORAL
  Filled 2021-05-09 (×4): qty 1

## 2021-05-09 MED ORDER — FOLIC ACID 1 MG PO TABS
1.0000 mg | ORAL_TABLET | Freq: Every day | ORAL | Status: DC
  Administered 2021-05-09 – 2021-05-12 (×4): 1 mg via ORAL
  Filled 2021-05-09 (×4): qty 1

## 2021-05-09 MED ORDER — PANTOPRAZOLE SODIUM 40 MG PO TBEC
40.0000 mg | DELAYED_RELEASE_TABLET | Freq: Every day | ORAL | Status: DC
  Administered 2021-05-10 – 2021-05-11 (×2): 40 mg via ORAL
  Filled 2021-05-09 (×2): qty 1

## 2021-05-09 MED ORDER — DILTIAZEM HCL 25 MG/5ML IV SOLN
10.0000 mg | Freq: Once | INTRAVENOUS | Status: AC
  Administered 2021-05-09: 10 mg via INTRAVENOUS
  Filled 2021-05-09: qty 5

## 2021-05-09 MED ORDER — ENSURE ENLIVE PO LIQD
237.0000 mL | Freq: Three times a day (TID) | ORAL | Status: DC
  Administered 2021-05-10 – 2021-05-12 (×6): 237 mL via ORAL

## 2021-05-09 MED ORDER — ADULT MULTIVITAMIN W/MINERALS CH
1.0000 | ORAL_TABLET | Freq: Every day | ORAL | Status: DC
  Administered 2021-05-10 – 2021-05-12 (×3): 1 via ORAL
  Filled 2021-05-09 (×3): qty 1

## 2021-05-09 MED ORDER — METOPROLOL TARTRATE 25 MG PO TABS
25.0000 mg | ORAL_TABLET | Freq: Two times a day (BID) | ORAL | Status: AC
  Administered 2021-05-09 – 2021-05-11 (×5): 25 mg via ORAL
  Filled 2021-05-09 (×5): qty 1

## 2021-05-09 MED ORDER — APIXABAN 2.5 MG PO TABS
2.5000 mg | ORAL_TABLET | Freq: Two times a day (BID) | ORAL | Status: DC
  Administered 2021-05-09 – 2021-05-12 (×7): 2.5 mg via ORAL
  Filled 2021-05-09 (×7): qty 1

## 2021-05-09 MED ORDER — PANTOPRAZOLE SODIUM 40 MG PO TBEC: 40.0000 mg | DELAYED_RELEASE_TABLET | Freq: Two times a day (BID) | ORAL | Status: DC

## 2021-05-09 MED ORDER — SODIUM CHLORIDE 0.9 % IV SOLN
200.0000 mg | INTRAVENOUS | Status: DC
  Administered 2021-05-09 – 2021-05-11 (×3): 200 mg via INTRAVENOUS
  Filled 2021-05-09 (×3): qty 200
  Filled 2021-05-09: qty 10

## 2021-05-09 MED ORDER — DILTIAZEM HCL-DEXTROSE 125-5 MG/125ML-% IV SOLN (PREMIX)
5.0000 mg/h | INTRAVENOUS | Status: DC
  Filled 2021-05-09: qty 125

## 2021-05-09 NOTE — Progress Notes (Signed)
Mobility Specialist - Progress Note   05/09/21 1400  Mobility  Activity Refused mobility     Pt sleeping supine upon arrival using RA. Pt refused session due to soreness in BLE, despite encouragement and education. Will attempt another date and time.  Filiberto Pinks Mobility Specialist 05/09/21, 2:44 PM

## 2021-05-09 NOTE — Progress Notes (Signed)
°   05/09/21 1746  Assess: MEWS Score  Pulse Rate (!) 214  Assess: MEWS Score  MEWS Temp 0  MEWS Systolic 0  MEWS Pulse 3  MEWS RR 0  MEWS LOC 0  MEWS Score 3  MEWS Score Color Yellow  Assess: if the MEWS score is Yellow or Red  Were vital signs taken at a resting state? Yes  Focused Assessment Change from prior assessment (see assessment flowsheet)  Does the patient meet 2 or more of the SIRS criteria? No  MEWS guidelines implemented *See Row Information* No, vital signs rechecked  Treat  Pain Scale 0-10  Pain Score 0  Take Vital Signs  Increase Vital Sign Frequency  Yellow: Q 2hr X 2 then Q 4hr X 2, if remains yellow, continue Q 4hrs  Escalate  MEWS: Escalate Yellow: discuss with charge nurse/RN and consider discussing with provider and RRT  Notify: Charge Nurse/RN  Name of Charge Nurse/RN Notified [place], RN  Date Charge Nurse/RN Notified 05/09/21  Time Charge Nurse/RN Notified 1748  Notify: Provider  Provider Name/Title Dr Lucianne Muss  Date Provider Notified 05/09/21  Time Provider Notified [phone]  Notification Type Page  Notification Reason Change in status  Provider response At bedside;No new orders  Date of Provider Response 05/09/21  Time of Provider Response 1800  Notify: Rapid Response  Name of Rapid Response RN Notified ICU  Date Rapid Response Notified 05/09/21  Time Rapid Response Notified 1748  Assess: SIRS CRITERIA  SIRS Temperature  0  SIRS Pulse 1  SIRS Respirations  0  SIRS WBC 0  SIRS Score Sum  1

## 2021-05-09 NOTE — Progress Notes (Signed)
Patient heart rate 130-140's Dr. Lucianne Muss notified  new orders received to transfer patient to Progressive care and initiate Cardizem drip.

## 2021-05-09 NOTE — Progress Notes (Signed)
Occupational Therapy Treatment Patient Details Name: Jody Lowe MRN: [id number] DOB: February 29, 1946 Today's Date: 05/09/2021   History of present illness The patient is a 76 year old female with history of iron deficiency anemia, previously on iron tablets, B12 deficiency anemia, generalized weakness, hyperlipidemia, depressive disorder, hypertension, tobacco use disorder, PAD, obesity, history of TIA, CKD 3A presented with concern for anxiety attack along with melena.   OT comments  Pt seen for OT treatment on this date. Upon arrival to room, pt asleep in bed however easily awoken following touch. Pt agreeable to OT tx. Pt performed supine>sit transfer requiring only SUPERVISION. Once seated EOB, pt demonstrated fair sitting balance. Pt required MIN A for donning socks d/t decreased activity tolerance. Pt required MOD A for sit>stand transfer with bed slightly elevated and MIN A for step pivot transfer bed>recliner d/t decreased balance, strength, and activity tolerance. Once seated in recliner, pt performed seated grooming tasks following set-up assist. Pt is making good progress toward goals and continues to benefit from skilled OT services to maximize return to PLOF and minimize risk of future falls, injury, caregiver burden, and readmission. Will continue to follow POC. Discharge recommendation remains appropriate.     Recommendations for follow up therapy are one component of a multi-disciplinary discharge planning process, led by the attending physician.  Recommendations may be updated based on patient status, additional functional criteria and insurance authorization.    Follow Up Recommendations  Skilled nursing-short term rehab (<3 hours/day)    Assistance Recommended at Discharge Frequent or constant Supervision/Assistance  Patient can return home with the following  A lot of help with walking and/or transfers;A lot of help with bathing/dressing/bathroom;Help with stairs or ramp for  entrance;Assistance with cooking/housework   Equipment Recommendations  Other (comment) (defer to next venue of care)       Precautions / Restrictions Precautions Precautions: Fall Restrictions Weight Bearing Restrictions: No       Mobility Bed Mobility Overal bed mobility: Needs Assistance Bed Mobility: Supine to Sit     Supine to sit: Supervision, HOB elevated     General bed mobility comments: With use of bedrails    Transfers Overall transfer level: Needs assistance Equipment used: Rolling walker (2 wheels) Transfers: Sit to/from Stand, Bed to chair/wheelchair/BSC Sit to Stand: Mod assist     Step pivot transfers: Min assist     General transfer comment: pt requires momentum and MOD A for sit>stand. Requires cues for safe hand placement with RW use     Balance Overall balance assessment: Needs assistance Sitting-balance support: No upper extremity supported, Feet supported Sitting balance-Leahy Scale: Fair Sitting balance - Comments: Able to reach outside BOS to don/doff socks while seated in recliner however fatigues quickly   Standing balance support: Bilateral upper extremity supported, During functional activity, Reliant on assistive device for balance Standing balance-Leahy Scale: Poor Standing balance comment: Requires MIN A for step pivot transfers                           ADL either performed or assessed with clinical judgement   ADL Overall ADL's : Needs assistance/impaired     Grooming: Wash/dry hands;Wash/dry face;Set up;Sitting               Lower Body Dressing: Sitting/lateral leans;Minimal assistance Lower Body Dressing Details (indicate cue type and reason): to don/doff socks. Pt requires physical assist to don second sock in setting of fatigue  Cognition Arousal/Alertness: Awake/alert Behavior During Therapy: WFL for tasks assessed/performed Overall Cognitive Status: Within Functional  Limits for tasks assessed                                 General Comments: Alert however fatigues quickly                   Pertinent Vitals/ Pain       Pain Assessment Pain Assessment: Faces Faces Pain Scale: Hurts a little bit Pain Location: R shoulder Pain Descriptors / Indicators: Aching Pain Intervention(s): Limited activity within patient's tolerance, Monitored during session, Repositioned         Frequency  Min 2X/week        Progress Toward Goals  OT Goals(current goals can now be found in the care plan section)     Acute Rehab OT Goals Patient Stated Goal: to go home OT Goal Formulation: With patient Time For Goal Achievement: 05/08/2021 Potential to Achieve Goals: Good  Plan         AM-PAC OT "6 Clicks" Daily Activity     Outcome Measure   Help from another person eating meals?: None Help from another person taking care of personal grooming?: A Little Help from another person toileting, which includes using toliet, bedpan, or urinal?: A Lot Help from another person bathing (including washing, rinsing, drying)?: A Lot Help from another person to put on and taking off regular upper body clothing?: A Little Help from another person to put on and taking off regular lower body clothing?: A Lot 6 Click Score: 16    End of Session Equipment Utilized During Treatment: Rolling walker (2 wheels)  OT Visit Diagnosis: Unsteadiness on feet (R26.81);Muscle weakness (generalized) (M62.81)   Activity Tolerance Patient tolerated treatment well   Patient Left in chair;with call bell/phone within reach   Nurse Communication Mobility status        Time: [id number] OT Time Calculation (min): 25 min  Charges: OT General Charges $OT Visit: 1 Visit OT Treatments $Self Care/Home Management : 23-37 mins  Fredirick Maudlin, OTR/L [place]

## 2021-05-09 NOTE — Progress Notes (Addendum)
Cross Cover Cardizem infusion ordered 1840 for afib with RVR, not initiated.  Patient currently in SR with rate of 79. Drip and transfer cancelled Chest xray 2/14 with findings consistent with pulmonary edema, now diuretic therapy ordered  DG chest IMPRESSION: 1. Cardiomegaly with vascular congestion mild diffuse interstitial opacity most likely edema 2. Increasing airspace disease at left lung base could reflect superimposed pneumonia  -40 mg IV lasix -check procal

## 2021-05-09 NOTE — Progress Notes (Signed)
Patient heart rate noted to be in 160's. Patient denied chest pain or pressor at that time. EKG done Dr. Lianne Cure notified and new orders received.  Rapid response nurse notified  and at bedside  with patient. Patient lost IV access and in process Heart rate increased 180's -200 briefly .Patient states she felt chest pressure.  IV reestablished Dr. Lucianne Muss at bedside to assess patient . Medications given per order. Patient heart rate 120 with no pain or pressure. Will continue to monitor.

## 2021-05-09 NOTE — Progress Notes (Addendum)
PT Cancellation Note  Patient Details Name: Jody Lowe MRN: [id number] DOB: [birth year]   Cancelled Treatment:    Reason Eval/Treat Not Completed: Patient declined, no reason specified Pt asleep in bed upon PT entrance into room and asked if she was willing to work with PT, in which she declined due to increased fatigue and already working with OT prior.    Jeralyn Bennett, SPT 05/09/2021, 2:48 PM

## 2021-05-09 NOTE — TOC Initial Note (Signed)
Transition of Care [facility]) - Initial/Assessment Note    Patient Details  Name: Jody Lowe MRN: [id number] Date of Birth: [birth year]  [facility] CM/SW Contact:    Chapman Fitch, RN Phone Number: 05/09/2021, 4:20 PM  Clinical Narrative:                  Admitted [id number] Admitted from: [facility] PCP: Burns Spain -[facility]in [facility]acy: [facility] Current home health/prior home health/DME: Shower seat, wC, 3 wheel walker  Therapy recommending SNF.  Patient and family agreeable Existing PASRR Fl2 sent for signature Bed search initiated           Patient Goals and CMS Choice        Expected Discharge Plan and Services                                                Prior Living Arrangements/Services                       Activities of Daily Living Home Assistive Devices/Equipment: Grab bars around toilet, Grab bars in shower, Walker (specify type), Wheelchair ADL Screening (condition at time of admission) Patient's cognitive ability adequate to safely complete daily activities?: Yes Is the patient deaf or have difficulty hearing?: No Does the patient have difficulty seeing, even when wearing glasses/contacts?: No Does the patient have difficulty concentrating, remembering, or making decisions?: No Patient able to express need for assistance with ADLs?: Yes Does the patient have difficulty dressing or bathing?: No Independently performs ADLs?: No Communication: Independent Dressing (OT): Independent Grooming: Independent Feeding: Independent Bathing: Needs assistance Is this a change from baseline?: Change from baseline, expected to last <3 days Toileting: Independent In/Out Bed: Independent Walks in Home: Independent with device (comment) Does the patient have difficulty walking or climbing stairs?: Yes Weakness of Legs: Both Weakness of Arms/Hands: None  Permission Sought/Granted                  Emotional Assessment               Admission diagnosis:  AKI (acute kidney injury) (HCC) [N17.9] Symptomatic anemia [D64.9] Chest pain, unspecified type [R07.9] Acute anemia [D64.9] Patient Active Problem List   Diagnosis Date Noted   Folate deficiency 05/09/2021   Hypokalemia 05/07/2021   Leukocytosis 05/07/2021   GI bleed 05/06/2021   Hyponatremia 05/06/2021   Acute metabolic acidosis 05/06/2021   Elevated LFTs 05/06/2021   Thrombocytosis 05/06/2021   Acute anemia 05/05/2021   Atherosclerosis of artery of extremity with ulceration (HCC) 01/03/2021   Shortness of breath 12/07/2020   Critical lower limb ischemia (HCC) 12/07/2020   Generalized weakness 10/20/2020   Weakness    Syncope 10/16/2020   Drop in hemoglobin    Elevated troponin    Pain in both lower extremities    Chronic pain of both knees    Syncope and collapse 10/14/2020   Anemia 10/14/2020   AKI (acute kidney injury) (HCC) 10/14/2020   Claudication in peripheral vascular disease (HCC) 09/04/2020   Falls frequently 09/04/2020   Stage 3a chronic kidney disease (HCC) 02/29/2020   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   TIA (transient ischemic attack) 05/01/2019   Hyperlipidemia 06/23/2017   Nasal congestion 05/08/2017   Tinnitus 05/08/2017   Back pain 04/21/2017   Shoulder pain 04/21/2017   Carpal  tunnel syndrome 08/23/2016   Age-related osteoporosis without current pathological fracture 06/27/2016   Intention tremor 06/27/2016   Risk for falls 02/20/2016   Tobacco use disorder 05/09/2015   Dizziness 10/19/2014   Subclavian steal syndrome 10/19/2014   Allergic rhinitis 12/01/2012   Macromastia 10/06/2012   Neck pain 10/06/2012   Autoantibody titer positive 04/06/2009   Iron deficiency anemia due to chronic blood loss 03/15/2009   PAD (peripheral artery disease) (HCC) 07/10/2005   Hypertension, benign 03/29/2003   Depressive disorder 10/06/2001   Osteoarthrosis 03/24/2001   B-complex deficiency 05/01/1995    Esophagitis 08/18/1990   Obesity 11/18/1989   PCP:  Idelia Salm, MD Pharmacy:   [facility] [place], [place] - [address] [place] [address] Phone: [phone] Fax: [phone]     Social Determinants of Health (SDOH) Interventions    Readmission Risk Interventions Readmission Risk Prevention Plan 01/05/2021  Transportation Screening Complete  PCP or Specialist Appt within 3-5 Days Complete  HRI or Home Care Consult Complete  Social Work Consult for Recovery Care Planning/Counseling Complete  Palliative Care Screening Not Applicable  Medication Review Oceanographer) Complete  Some recent data might be hidden

## 2021-05-09 NOTE — Progress Notes (Signed)
Progress Note   Patient: Jody Lowe [id number] DOB: 29-Sep-1945 DOA: 05/05/2021     4 DOS: the patient was seen and examined on 05/09/2021   Brief hospital course: 76 year old female with history of iron deficiency anemia, previously on iron tablets, B12 deficiency anemia, generalized weakness, hyperlipidemia, depressive disorder, hypertension, tobacco use disorder, PAD, obesity, history of TIA, CKD 3A presented with concern for anxiety attack along with melena for several years.  On presentation, sodium was 129, creatinine of 1.60, bicarb of 13, WBC of 9, hemoglobin of 2.9 with hematocrit of 10.9 and platelets of 638.  High-sensitivity troponin was 475.  She received packed red cell transfusion.  She was started on IV Protonix and GI was consulted.  Assessment and Plan: * Acute anemia- (present on admission) Acute on chronic severe anemia -Hemoglobin 2.9 on admission.  Status post 4 units packed red cell transfusion.  Hemoglobin 8.2 today. Monitor H&H.  Folate deficiency Folic acid level 5.8, started folic acid 1 mg p.o. daily, follow with PCP to repeat folate level after 3 months.  Leukocytosis - Possibly reactive.  White count still going up.  No signs of infection.  We will check blood cultures, chest x-ray and UA.  Repeat a.m. labs.  Hypokalemia - Improved  Thrombocytosis- (present on admission) - Possibly reactive.  Monitor  Elevated LFTs- (present on admission) - Questionable cause.  Right upper quadrant ultrasound showed liver appearance suspicious for cirrhosis but no masses along with trace ascites.  LFTs pending for today.  Repeat a.m. LFTs.  Follow GI recommendations.  Hepatitis panel negative. -Statin on hold  Acute metabolic acidosis- (present on admission) - Possibly from AKI and poor oral intake. -Resolved  Hyponatremia- (present on admission) - Possibly from poor oral intake.  Sodium improving to 134 today.  Off IV fluids.  Repeat a.m. labs.  GI bleed-  (present on admission) -Patient apparently has had melena for years GI consulted, s/p EGD negative, colonoscopy poor prep, GI recommended to continue DOAC and aspirin, benefit outweighs the risk of bleeding.  GI bleeding could be secondary to prior history of AVM Follow CBC daily Follow GI as an outpatient for repeat colonoscopy Continue IV Protonix.     Hyperlipidemia- (present on admission) - Hold statin due to elevated LFTs  Generalized weakness - PT recommends SNF placement.  Social worker consult.  Elevated troponin- (present on admission) - Possibly from demand ischemia from anemia.  Troponin 475 on presentation, did not trend upwards and repeat troponin was 396. -No chest pain.  No further work-up needed.  AKI (acute kidney injury) ([place])- (present on admission) - Possibly prerenal.  Creatinine improving.  Repeat a.m. labs  Depressive disorder- (present on admission) - Continue home regimen.  Outpatient follow-up with PCP or psychiatry  Hypertension, benign- (present on admission) - Blood pressure intermittently on the lower side..  Monitor.  Iron deficiency anemia due to chronic blood loss- (present on admission) Iron saturation 4%, iron level 14 Started Venofer 200 mg IV during hospital stay, start oral supplement on discharge with vitamin C.  Follow with PCP and repeat iron profile after 3 months.  B12 deficiency-resolved as of 05/09/2021, (present on admission) - Continue supplementation.  Discontinued due to a supplement, B12 level is elevated 1350, follow with PCP to repeat vitamin B12 level after 6 months      Subjective: No significant overnight events, patient denied any GI bleeding.  Denied any chest pain or palpitation, no nausea vomiting or diarrhea.  Physical Exam: Vitals:  05/08/21 2045 05/09/21 0427 05/09/21 0722 05/09/21 1527  BP: 118/60 134/69 (!) 110/56 (!) 110/59  Pulse: 100 93 95 95  Resp: 18 15 17 16   Temp: 98.5 F (36.9 C) 98.4 F (36.9 C)  99 F (37.2 C) 98.5 F (36.9 C)  TempSrc: Oral Oral Oral Oral  SpO2: 98% 97% 97% 97%  Weight:      Height:        Physical Exam: General:  alert oriented to time, place, and person.  Appear in no distress, affect appropriate Eyes: PERRLA ENT: Oral Mucosa Clear, moist  Neck: no JVD,  Cardiovascular: S1 and S2 Present, no Murmur,  Respiratory: good respiratory effort, Bilateral Air entry equal and Decreased, no Crackles, no wheezes Abdomen: Bowel Sound present, Soft and no tenderness,  Skin: no rashes Extremities: no Pedal edema, no calf tenderness Neurologic: without any new focal findings Gait not checked due to patient safety concerns  Data Reviewed:  Labs reviewed and analyzed Hb 8.2 stable WBC 13.3 elevated Sodium 132 Creatinine 1.06 improving LFTs improving Iron 4%, folic acid 5.8 low  Family Communication: Discussed with family at bedside  Disposition: Status is: Inpatient Remains inpatient appropriate because: GI bleeding and low hemoglobin Resumed anticoagulation as per GI, monitor CBC Plan for discharge in 1 to 2 days if Hb remains stable  PT OT eval recommended SNF placement   Planned Discharge Destination: [place] is pending   DVT Prophylaxis  .Apixaban (eliquis) tablet 2.5 mg , Place ted hose    Time spent: 40 minutes  Author: Val Riles, MD 05/09/2021 4:20 PM  For on call review [email or web address].

## 2021-05-09 NOTE — Progress Notes (Signed)
°   05/09/21 1600  Clinical Encounter Type  Visited With Patient  Visit Type Follow-up   Chaplain followed up on previous visit when he gave advance directive. Patient still has not worked on her copy and is waiting to consult with her son.

## 2021-05-09 NOTE — Assessment & Plan Note (Signed)
Folic acid level 5.8, started folic acid 1 mg p.o. daily, follow with PCP to repeat folate level after 3 months.

## 2021-05-09 NOTE — Care Management Important Message (Signed)
Important Message  Patient Details  Name: Jody Lowe MRN: [id number] Date of Birth: Nov 16, 1945   Medicare Important Message Given:  Yes     [name] 05/09/2021, 10:55 AM

## 2021-05-09 NOTE — Progress Notes (Signed)
Rapid Response Event Note   Reason for Call : elevated HR   Initial Focused Assessment: RRRN called initially by pt's primary to nurse to clarify if cardizem can be pushed on 2C. On my arrival pt's HR in 160s and pt has no IV access, waiting on IV team. Pt's HR then spiked to 188 and a rapid response was called. Pt is alert and oriented and says she feels some tightness in her chest and is a little short of breath. BP stable at this time.   Interventions: IV team at bedside starting IV. 2L [place] placed on pt for comfort. MD paged. Cardizem push ordered per MD. Cardizem given after IV started. Pt's HR down to 120s. MD at bedside. BP remains stable and pt says chest tightness and shob has improved.   Plan of Care: Pt will remain on 2C for now after clarifying with [facility] that cardizem can be pushed on this unit. Pt's VSS on 2L [place]. Will follow up on patient.    Event Summary:   MD Notified: Dr. Lucianne Muss Call Time: 1745 Arrival Time: 1745 End Time: 1803  Henrene Dodge, RN

## 2021-05-09 NOTE — Progress Notes (Signed)
°   05/09/21 1746 05/09/21 1754  Assess: MEWS Score  Temp  --  98.5 F (36.9 C)  BP  --  137/71  Pulse Rate (!) 214 (!) 119  ECG Heart Rate  --  (!) 119  Resp  --  17  Level of Consciousness  --  Alert  SpO2  --  99 %  O2 Device  --  Nasal Cannula  O2 Flow Rate (L/min)  --  2 L/min  Assess: MEWS Score  MEWS Temp 0 0  MEWS Systolic 0 0  MEWS Pulse 3 2  MEWS RR 0 0  MEWS LOC 0 0  MEWS Score 3 2  MEWS Score Color Yellow Yellow  Assess: if the MEWS score is Yellow or Red  Were vital signs taken at a resting state?  --  Yes  Focused Assessment  --  Change from prior assessment (see assessment flowsheet)  Does the patient meet 2 or more of the SIRS criteria?  --  No  MEWS guidelines implemented *See Row Information*  --  Yes  Treat  MEWS Interventions  --  Administered scheduled meds/treatments;Administered prn meds/treatments  Pain Scale  --  0-10  Pain Score  --  0  Take Vital Signs  Increase Vital Sign Frequency   --  Yellow: Q 2hr X 2 then Q 4hr X 2, if remains yellow, continue Q 4hrs  Escalate  MEWS: Escalate  --  Yellow: discuss with charge nurse/RN and consider discussing with provider and RRT  Assess: SIRS CRITERIA  SIRS Temperature  0 0  SIRS Pulse 1 1  SIRS Respirations  0 0  SIRS WBC 0 0  SIRS Score Sum  1 1

## 2021-05-09 NOTE — Assessment & Plan Note (Signed)
Iron saturation 4%, iron level 14 Started Venofer 200 mg IV during hospital stay, start oral supplement on discharge with vitamin C.  Follow with PCP and repeat iron profile after 3 months.

## 2021-05-10 DIAGNOSIS — I480 Paroxysmal atrial fibrillation: Secondary | ICD-10-CM | POA: Diagnosis not present

## 2021-05-10 LAB — HEPATIC FUNCTION PANEL
ALT: 87 U/L — ABNORMAL HIGH (ref 0–44)
AST: 19 U/L (ref 15–41)
Albumin: 2.5 g/dL — ABNORMAL LOW (ref 3.5–5.0)
Alkaline Phosphatase: 128 U/L — ABNORMAL HIGH (ref 38–126)
Bilirubin, Direct: 0.2 mg/dL (ref 0.0–0.2)
Indirect Bilirubin: 0.5 mg/dL (ref 0.3–0.9)
Total Bilirubin: 0.7 mg/dL (ref 0.3–1.2)
Total Protein: 6.4 g/dL — ABNORMAL LOW (ref 6.5–8.1)

## 2021-05-10 LAB — BASIC METABOLIC PANEL
Anion gap: 5 (ref 5–15)
BUN: 24 mg/dL — ABNORMAL HIGH (ref 8–23)
CO2: 20 mmol/L — ABNORMAL LOW (ref 22–32)
Calcium: 9.2 mg/dL (ref 8.9–10.3)
Chloride: 107 mmol/L (ref 98–111)
Creatinine, Ser: 1.11 mg/dL — ABNORMAL HIGH (ref 0.44–1.00)
GFR, Estimated: 51 mL/min — ABNORMAL LOW (ref >60)
Glucose, Bld: 110 mg/dL — ABNORMAL HIGH (ref 70–99)
Potassium: 4.1 mmol/L (ref 3.5–5.1)
Sodium: 132 mmol/L — ABNORMAL LOW (ref 135–145)

## 2021-05-10 LAB — MAGNESIUM: Magnesium: 1.7 mg/dL (ref 1.7–2.4)

## 2021-05-10 LAB — CBC
HCT: 27.5 % — ABNORMAL LOW (ref 36.0–46.0)
Hemoglobin: 8.6 g/dL — ABNORMAL LOW (ref 12.0–15.0)
MCH: 24.2 pg — ABNORMAL LOW (ref 26.0–34.0)
MCHC: 31.3 g/dL (ref 30.0–36.0)
MCV: 77.5 fL — ABNORMAL LOW (ref 80.0–100.0)
Platelets: 537 10*3/uL — ABNORMAL HIGH (ref 150–400)
RBC: 3.55 MIL/uL — ABNORMAL LOW (ref 3.87–5.11)
RDW: 22.5 % — ABNORMAL HIGH (ref 11.5–15.5)
WBC: 10 10*3/uL (ref 4.0–10.5)
nRBC: 1 % — ABNORMAL HIGH (ref 0.0–0.2)

## 2021-05-10 LAB — PHOSPHORUS: Phosphorus: 3.4 mg/dL (ref 2.5–4.6)

## 2021-05-10 MED ORDER — POLYSACCHARIDE IRON COMPLEX 150 MG PO CAPS: 150.0000 mg | ORAL_CAPSULE | Freq: Every day | ORAL | Status: DC

## 2021-05-10 MED ORDER — ASCORBIC ACID 500 MG PO TABS
500.0000 mg | ORAL_TABLET | Freq: Every day | ORAL | Status: DC
  Administered 2021-05-12: 500 mg via ORAL
  Filled 2021-05-10: qty 1

## 2021-05-10 NOTE — TOC Progression Note (Signed)
Transition of Care [facility]) - Progression Note    Patient Details  Name: Jody Lowe MRN: [id number] Date of Birth: 16-Sep-1945  [facility] of [facility]) CM/SW Contact  Chapman Fitch, RN Phone Number: 05/10/2021, 3:46 PM  Clinical Narrative:      Insurance Berkley Harvey has been obtained to [facility].  Anticipated DC tomorrow. [facility] notified.  Patient will NOT need repeat covid test       Expected Discharge Plan and Services                                                 Social Determinants of Health (SDOH) Interventions    Readmission Risk Interventions Readmission Risk Prevention Plan 01/05/2021  Transportation Screening Complete  PCP or Specialist Appt within 3-5 Days Complete  HRI or Home Care Consult Complete  Social Work Consult for Recovery Care Planning/Counseling Complete  Palliative Care Screening Not Applicable  Medication Review Oceanographer) Complete  Some recent data might be hidden

## 2021-05-10 NOTE — Progress Notes (Signed)
Mobility Specialist - Progress Note   05/10/21 1100  Mobility  Activity Ambulated with assistance to bathroom;Ambulated with assistance in room;Dangled on edge of bed  Level of Assistance Standby assist, set-up cues, supervision of patient - no hands on  Assistive Device Front wheel walker  Distance Ambulated (ft) 12 ft  Activity Response Tolerated well  $Mobility charge 1 Mobility    Pre-mobility: 90 HR, 97% SpO2 During mobility: 111 HR, 93% SpO2 Post-mobility: 98 HR, 93% SpO2   Pt lying in bed upon arrival, utilizing RA. Pt able to exit bed with modI and extra time. Does develop dizziness upon sitting. Extensive time at EOB for pt to prepare for standing. Unable to come into upright position from standard bed height after multiple attempts---successful on 2nd attempt with supervision and bed significantly elevated. Pt ambulated to bathroom, noted light weigh-bearing on on RLE but denies pain. Does voice weakness in BLE. Small BM. Pt independent in seated peri-care---3in1 used to stand from toilet. Max HR 111 bpm, O2 maintained 90s. Pt ambulated back to bedside, left with NT for bathing tasks.   Kathee Delton Mobility Specialist 05/10/21, 12:09 PM

## 2021-05-10 NOTE — Assessment & Plan Note (Addendum)
2/15 patient developed A-fib with RVR, Cardizem 10 mg IV x1 dose given and started on Lopressor.  Patient converted back to normal sinus rhythm Patient is already on DOAC,  2D echo shows decreased LVEF 35 to [id number], grade 2 diastolic dysfunction,  Moderate to severe mitral valve regurgitation.  Prior 2D echocardiogram showed EF 65% and grade 1 diastolic dysfunction.  Due to reduced LV function cardiology has been consulted for further work-up and management.

## 2021-05-10 NOTE — Progress Notes (Signed)
Physical Therapy Treatment Patient Details Name: Jody Lowe MRN: [id number] DOB: September 16, 1945 Today's Date: 05/10/2021   History of Present Illness The patient is a 76 year old female with history of iron deficiency anemia, previously on iron tablets, B12 deficiency anemia, generalized weakness, hyperlipidemia, depressive disorder, hypertension, tobacco use disorder, PAD, obesity, history of TIA, CKD 3A presented with concern for anxiety attack along with melena.    PT Comments    Per TOC, pt has SNF bed but needs PT note for approval.  She is encouraged to participate and is motivated by possibility of transfer to SNF today.  Min a for bed mobility.  Stood from elevated bed height and mod a x 1.  She tries to step but is unable and sits back on bed.  Stated she is too tired to walk but remains sitting EOB for seated AROM.   Recommendations for follow up therapy are one component of a multi-disciplinary discharge planning process, led by the attending physician.  Recommendations may be updated based on patient status, additional functional criteria and insurance authorization.  Follow Up Recommendations  Skilled nursing-short term rehab (<3 hours/day)     Assistance Recommended at Discharge    Patient can return home with the following A lot of help with walking and/or transfers;A lot of help with bathing/dressing/bathroom;Assistance with cooking/housework;Assist for transportation;Help with stairs or ramp for entrance   Equipment Recommendations  Rolling walker (2 wheels)    Recommendations for Other Services       Precautions / Restrictions Precautions Precautions: Fall Restrictions Weight Bearing Restrictions: No     Mobility  Bed Mobility Overal bed mobility: Needs Assistance Bed Mobility: Supine to Sit, Sit to Supine     Supine to sit: Min assist Sit to supine: Min assist   General bed mobility comments: With use of bedrails    Transfers Overall transfer level:  Needs assistance Equipment used: Rolling walker (2 wheels) Transfers: Sit to/from Stand Sit to Stand: Mod assist, From elevated surface                Ambulation/Gait                   Stairs             Wheelchair Mobility    Modified Rankin (Stroke Patients Only)       Balance Overall balance assessment: Needs assistance Sitting-balance support: No upper extremity supported, Feet supported Sitting balance-Leahy Scale: Fair Sitting balance - Comments: Able to reach outside BOS to don/doff socks while seated in recliner however fatigues quickly   Standing balance support: Bilateral upper extremity supported, During functional activity, Reliant on assistive device for balance Standing balance-Leahy Scale: Poor Standing balance comment: Requires MIN A for step pivot transfers                            Cognition Arousal/Alertness: Awake/alert Behavior During Therapy: WFL for tasks assessed/performed Overall Cognitive Status: Within Functional Limits for tasks assessed                                          Exercises      General Comments        Pertinent Vitals/Pain Pain Assessment Pain Assessment: Faces Faces Pain Scale: Hurts even more Pain Location: knees Pain Descriptors / Indicators: Sore Pain Intervention(s): Limited activity  within patient's tolerance, Monitored during session    Home Living                          Prior Function            PT Goals (current goals can now be found in the care plan section) Progress towards PT goals: Progressing toward goals    Frequency    Min 2X/week      PT Plan Current plan remains appropriate    Co-evaluation              AM-PAC PT "6 Clicks" Mobility   Outcome Measure  Help needed turning from your back to your side while in a flat bed without using bedrails?: A Little Help needed moving from lying on your back to sitting on the  side of a flat bed without using bedrails?: A Little Help needed moving to and from a bed to a chair (including a wheelchair)?: A Little Help needed standing up from a chair using your arms (e.g., wheelchair or bedside chair)?: A Little Help needed to walk in hospital room?: A Little Help needed climbing 3-5 steps with a railing? : Total 6 Click Score: 16    End of Session Equipment Utilized During Treatment: Gait belt Activity Tolerance: Patient limited by lethargy Patient left: in bed;with call bell/phone within reach;with bed alarm set Nurse Communication: Mobility status PT Visit Diagnosis: Other abnormalities of gait and mobility (R26.89);Difficulty in walking, not elsewhere classified (R26.2);Muscle weakness (generalized) (M62.81)     Time: 0320-0331 PT Time Calculation (min) (ACUTE ONLY): 11 min  Charges:  $Therapeutic Exercise: 8-22 mins                    Chesley Noon, PTA 05/10/21, 3:35 PM '

## 2021-05-10 NOTE — TOC Progression Note (Signed)
Transition of Care [facility]) - Progression Note    Patient Details  Name: Jody Lowe MRN: [id number] Date of Birth: [birth year]  Transition of Care [facility]) CM/SW Contact  Chapman Fitch, RN Phone Number: 05/10/2021, 2:22 PM  Clinical Narrative:        Bed offer presented to patient.  She accepts offer at [facility] Updated son and daughter in law Auth initiated in [place] hub Requested updated therapy note from therapist     Expected Discharge Plan and Services                                                 Social Determinants of Health (SDOH) Interventions    Readmission Risk Interventions Readmission Risk Prevention Plan 01/05/2021  Transportation Screening Complete  PCP or Specialist Appt within 3-5 Days Complete  HRI or Home Care Consult Complete  Social Work Consult for Recovery Care Planning/Counseling Complete  Palliative Care Screening Not Applicable  Medication Review [employer]) Complete  Some recent data might be hidden

## 2021-05-10 NOTE — Progress Notes (Signed)
Progress Note   Patient: Jody Lowe [id number] DOB: [birth year] DOA: 05/05/2021     5 DOS: the patient was seen and examined on 05/10/2021   Brief hospital course: 76 year old female with history of iron deficiency anemia, previously on iron tablets, B12 deficiency anemia, generalized weakness, hyperlipidemia, depressive disorder, hypertension, tobacco use disorder, PAD, obesity, history of TIA, CKD 3A presented with concern for anxiety attack along with melena for several years.  On presentation, sodium was 129, creatinine of 1.60, bicarb of 13, WBC of 9, hemoglobin of 2.9 with hematocrit of 10.9 and platelets of 638.  High-sensitivity troponin was 475.  She received packed red cell transfusion.  She was started on IV Protonix and GI was consulted.  Assessment and Plan: * Acute anemia- (present on admission) Acute on chronic severe anemia -Hemoglobin 2.9 on admission.  Status post 4 units packed red cell transfusion.  Hemoglobin 8.2 today. Monitor H&H.  Paroxysmal A-fib ([place]) 2/15 patient developed A-fib with RVR, Cardizem 10 mg IV x1 dose given and started on Lopressor.  Patient converted back to normal sinus rhythm Patient is already on DOAC, follow 2D echocardiogram Patient was recommended to follow-up with cardiology as an outpatient for further work-up and management  Folate deficiency Folic acid level 5.8, started folic acid 1 mg p.o. daily, follow with PCP to repeat folate level after 3 months.  Leukocytosis - Possibly reactive.  No signs of infection.  WBC count trended down to normal, blood culture NGTD    Hypokalemia - Improved  Thrombocytosis- (present on admission) - Possibly reactive.  Monitor  Elevated LFTs- (present on admission) - Questionable cause.  Right upper quadrant ultrasound showed liver appearance suspicious for cirrhosis but no masses along with trace ascites.  LFTs pending for today.  Repeat a.m. LFTs.  Follow GI recommendations.  Hepatitis panel  negative. -Statin on hold  Acute metabolic acidosis- (present on admission) - Possibly from AKI and poor oral intake. -Resolved  Hyponatremia- (present on admission) - Possibly from poor oral intake.  Sodium improving to 134 today.  Off IV fluids.  Repeat a.m. labs.  GI bleed- (present on admission) -Patient apparently has had melena for years GI consulted, s/p EGD negative, colonoscopy poor prep, GI recommended to continue DOAC and aspirin, benefit outweighs the risk of bleeding.  GI bleeding could be secondary to prior history of AVM Follow CBC daily Follow GI as an outpatient for repeat colonoscopy S/p IV Protonix, switch to pantoprazole 40 mg p.o. daily   Hyperlipidemia- (present on admission) - Hold statin due to elevated LFTs  Generalized weakness - PT recommends SNF placement.  Social worker consult.  Elevated troponin- (present on admission) - Possibly from demand ischemia from anemia.  Troponin 475 on presentation, did not trend upwards and repeat troponin was 396. -No chest pain.  No further work-up needed.  AKI (acute kidney injury) ([place])- (present on admission) - Possibly prerenal.  Creatinine improving.  Repeat a.m. labs  Depressive disorder- (present on admission) - Continue home regimen.  Outpatient follow-up with PCP or psychiatry  Hypertension, benign- (present on admission) - Blood pressure intermittently on the lower side..  Monitor. 2/15 started Lopressor 25 mg twice daily due to A-fib with RVR, patient converted back to normal sinus rhythm  Iron deficiency anemia due to chronic blood loss- (present on admission) Iron saturation 4%, iron level 14 Started Venofer 200 mg IV during hospital stay, start oral supplement on discharge with vitamin C.  Follow with PCP and repeat iron profile after 3  months.  B12 deficiency-resolved as of 05/09/2021, (present on admission) - Continue supplementation.  Discontinued due to a supplement, B12 level is elevated 1350,  follow with PCP to repeat vitamin B12 level after 6 months      Subjective: No significant overnight events, patient denied any GI bleeding.  Denied any chest pain or palpitation, no nausea vomiting or diarrhea.  Physical Exam: Vitals:   05/10/21 0300 05/10/21 0737 05/10/21 0914 05/10/21 1353  BP: 125/68 132/64 139/84 129/73  Pulse: 80 82 84 76  Resp: 20 18 19 15   Temp: 98.2 F (36.8 C) 98.4 F (36.9 C)  99.6 F (37.6 C)  TempSrc: Oral Oral  Oral  SpO2: 99% 98% 98% 100%  Weight: 95.1 kg     Height:        Physical Exam: General:  alert oriented to time, place, and person.  Appear in no distress, affect appropriate Eyes: PERRLA ENT: Oral Mucosa Clear, moist  Neck: no JVD,  Cardiovascular: S1 and S2 Present, no Murmur,  Respiratory: good respiratory effort, Bilateral Air entry equal and Decreased, no Crackles, no wheezes Abdomen: Bowel Sound present, Soft and no tenderness,  Skin: no rashes Extremities: no Pedal edema, no calf tenderness Neurologic: without any new focal findings Gait not checked due to patient safety concerns  Data Reviewed:  Labs reviewed and analyzed Hb 8.6 stable WBC 10 wnl Sodium 132 Creatinine 1.11 slightly elevated LFTs improving Iron 4%, folic acid 5.8 low  Family Communication: Discussed with family at bedside 2/15,  Nobody at bedside today  Disposition: Status is: Inpatient Remains inpatient appropriate because: GI bleeding and low hemoglobin Resumed anticoagulation as per GI, monitor CBC Plan for discharge in 1 to 2 days if Hb remains stable  PT OT eval recommended SNF placement   Planned Discharge Destination: [place] approved, patient will be discharged tomorrow a.m. to SNF   DVT Prophylaxis  .Apixaban (eliquis) tablet 2.5 mg , Place ted hose    Time spent: 40 minutes  Author: Val Riles, MD 05/10/2021 3:44 PM  For on call review [email or web address].

## 2021-05-10 NOTE — Progress Notes (Signed)
PT Cancellation Note  Patient Details Name: Jody Lowe MRN: [id number] DOB: 1946-01-11   Cancelled Treatment:    Reason Eval/Treat Not Completed: Fatigue/lethargy limiting ability to participate  Pt offered and encouraged to participate in session.  Stated she is fatigued and not able to do so right now.  She asks when she is going to rehab and again encouraged pt to worth with therapy to help speed transition.  She has been up and walked to bathroom with mobility techs today.  Declined further gait or ex at this time stating she would do so tomorrow.    Danielle Dess 05/10/2021, 2:59 PM

## 2021-05-11 ENCOUNTER — Inpatient Hospital Stay (HOSPITAL_COMMUNITY)
Admit: 2021-05-11 | Discharge: 2021-05-11 | Disposition: A | Discharge status: Home / Self Care | Payer: Medicare Other | Attending: Student | Admitting: Student

## 2021-05-11 ENCOUNTER — Encounter: Payer: Self-pay | Admitting: Internal Medicine

## 2021-05-11 DIAGNOSIS — I340 Nonrheumatic mitral (valve) insufficiency: Secondary | ICD-10-CM

## 2021-05-11 DIAGNOSIS — I4892 Unspecified atrial flutter: Secondary | ICD-10-CM

## 2021-05-11 DIAGNOSIS — I429 Cardiomyopathy, unspecified: Secondary | ICD-10-CM

## 2021-05-11 DIAGNOSIS — I4891 Unspecified atrial fibrillation: Secondary | ICD-10-CM

## 2021-05-11 DIAGNOSIS — D509 Iron deficiency anemia, unspecified: Secondary | ICD-10-CM

## 2021-05-11 LAB — BASIC METABOLIC PANEL
Anion gap: 7 (ref 5–15)
BUN: 19 mg/dL (ref 8–23)
CO2: 21 mmol/L — ABNORMAL LOW (ref 22–32)
Calcium: 9.6 mg/dL (ref 8.9–10.3)
Chloride: 106 mmol/L (ref 98–111)
Creatinine, Ser: 0.98 mg/dL (ref 0.44–1.00)
GFR, Estimated: 59 mL/min — ABNORMAL LOW (ref >60)
Glucose, Bld: 111 mg/dL — ABNORMAL HIGH (ref 70–99)
Potassium: 4.1 mmol/L (ref 3.5–5.1)
Sodium: 134 mmol/L — ABNORMAL LOW (ref 135–145)

## 2021-05-11 LAB — CBC
HCT: 29.9 % — ABNORMAL LOW (ref 36.0–46.0)
Hemoglobin: 9.2 g/dL — ABNORMAL LOW (ref 12.0–15.0)
MCH: 23.8 pg — ABNORMAL LOW (ref 26.0–34.0)
MCHC: 30.8 g/dL (ref 30.0–36.0)
MCV: 77.3 fL — ABNORMAL LOW (ref 80.0–100.0)
Platelets: 563 10*3/uL — ABNORMAL HIGH (ref 150–400)
RBC: 3.87 MIL/uL (ref 3.87–5.11)
RDW: 22.7 % — ABNORMAL HIGH (ref 11.5–15.5)
WBC: 11.3 10*3/uL — ABNORMAL HIGH (ref 4.0–10.5)
nRBC: 0.4 % — ABNORMAL HIGH (ref 0.0–0.2)

## 2021-05-11 LAB — ECHOCARDIOGRAM COMPLETE
AR max vel: 1.65 cm2
AV Area VTI: 2.04 cm2
AV Area mean vel: 1.79 cm2
AV Mean grad: 2 mmHg
AV Peak grad: 3.7 mmHg
Ao pk vel: 0.96 m/s
Area-P 1/2: 5.46 cm2
Calc EF: 34.4 %
Height: 66 in
MV VTI: 1.38 cm2
S' Lateral: 4 cm
Single Plane A2C EF: 33.4 %
Single Plane A4C EF: 34.4 %
Weight: 3354.52 oz

## 2021-05-11 LAB — PHOSPHORUS: Phosphorus: 3.7 mg/dL (ref 2.5–4.6)

## 2021-05-11 LAB — HEPATIC FUNCTION PANEL
ALT: 64 U/L — ABNORMAL HIGH (ref 0–44)
AST: 14 U/L — ABNORMAL LOW (ref 15–41)
Albumin: 2.6 g/dL — ABNORMAL LOW (ref 3.5–5.0)
Alkaline Phosphatase: 126 U/L (ref 38–126)
Bilirubin, Direct: 0.2 mg/dL (ref 0.0–0.2)
Indirect Bilirubin: 0.4 mg/dL (ref 0.3–0.9)
Total Bilirubin: 0.6 mg/dL (ref 0.3–1.2)
Total Protein: 6.6 g/dL (ref 6.5–8.1)

## 2021-05-11 LAB — MAGNESIUM: Magnesium: 1.5 mg/dL — ABNORMAL LOW (ref 1.7–2.4)

## 2021-05-11 LAB — TSH: TSH: 1.318 u[IU]/mL (ref 0.350–4.500)

## 2021-05-11 MED ORDER — METOPROLOL SUCCINATE ER 50 MG PO TB24
50.0000 mg | ORAL_TABLET | Freq: Every day | ORAL | Status: DC
  Administered 2021-05-12: 50 mg via ORAL
  Filled 2021-05-11: qty 1

## 2021-05-11 MED ORDER — FUROSEMIDE 10 MG/ML IJ SOLN
40.0000 mg | Freq: Every day | INTRAMUSCULAR | Status: DC
  Administered 2021-05-11: 40 mg via INTRAVENOUS
  Filled 2021-05-11: qty 4

## 2021-05-11 MED ORDER — POLYSACCHARIDE IRON COMPLEX 150 MG PO CAPS: 150.0000 mg | ORAL_CAPSULE | Freq: Every day | ORAL | Status: DC

## 2021-05-11 MED ORDER — MAGNESIUM OXIDE -MG SUPPLEMENT 400 (240 MG) MG PO TABS: 400.0000 mg | ORAL_TABLET | Freq: Two times a day (BID) | ORAL | Status: DC

## 2021-05-11 MED ORDER — MAGNESIUM SULFATE 2 GM/50ML IV SOLN
2.0000 g | Freq: Once | INTRAVENOUS | Status: AC
  Administered 2021-05-11: 2 g via INTRAVENOUS
  Filled 2021-05-11: qty 50

## 2021-05-11 MED ORDER — AMIODARONE HCL 200 MG PO TABS
200.0000 mg | ORAL_TABLET | Freq: Two times a day (BID) | ORAL | Status: DC
  Administered 2021-05-11 – 2021-05-12 (×3): 200 mg via ORAL
  Filled 2021-05-11 (×3): qty 1

## 2021-05-11 MED ORDER — FAMOTIDINE 20 MG PO TABS
20.0000 mg | ORAL_TABLET | Freq: Every day | ORAL | Status: DC
  Administered 2021-05-11 – 2021-05-12 (×2): 20 mg via ORAL
  Filled 2021-05-11 (×2): qty 1

## 2021-05-11 NOTE — Consult Note (Addendum)
Cardiology Consult    Patient ID: Jody Lowe MRN: [id number], DOB/AGE: 78/21/1947   Admit date: 05/05/2021 Date of Consult: 05/11/2021  Primary Physician: Tomasita Morrow, MD Primary Cardiologist: Nelva Bush, MD Requesting Provider: Edd Fabian, MD  Patient Profile    Jody Lowe is a 76 y.o. female with a history of PAD, HTN, HL, obesity, TIA, iron def anemia, CKD III, tobacco abuse, and angina w/ low risk stress test in 11/2020, who is being seen today for the evaluation of paroxysmal atrial flutter w/ RVR as well as demand ischemia, and new cardiomyopathy (EF 35-40%, mod-sev MR) at the request of Dr. Dwyane Dee.  Past Medical History   Past Medical History:  Diagnosis Date   Angina at [facility])    a. 11/2020 MV: Nl EF. No ischemia/infarct. Ao and Cor Ca2+-->low risk.   Atrial flutter, paroxysmal ([place])    a. Dx 04/2021. CHA2DS2VASc = 8.   B12 deficiency    Cardiomyopathy ([place])    a. 09/2020 Echo: EF 60-65%, no rwma, GrI DD; b. 04/2021 Echo: EF 35-40%, GrII DD, nl RV fxn, RVSP 36.52mmHg, mod-sev MR.   CKD (chronic kidney disease), stage III (HCC)    Depression    GI bleeding    a. 04/2021 admit w/ Hgb 2.9/Hct 10.9; b. 04/2021 EGD Nl. Colonoscopy aborted due to poor prep.   History of tobacco abuse    a. 60 yrs - quit Fall 2022.   Hyperlipidemia LDL goal <70    Hypertension    Iron deficiency anemia    Moderate to severe mitral regurgitation    Morbid obesity ([place])    Peripheral artery disease ([place])    a. 09/2020 s/p PTA of L TP trunk and prox Peroneal, L SFA and above knee popliteal, and Viabahn stenting of mid-dis L SFA & prox popliteal; b. 10/2020 Attempted PTA to R SFA; c. 12/2020 R CFA & Profunda endarterectomy w/ patch angioplasty, R Fem-below knee Pop bypass.   TIA (transient ischemic attack)     Past Surgical History:  Procedure Laterality Date   BREAST REDUCTION SURGERY     CARDIAC CATHETERIZATION     CATARACT EXTRACTION W/PHACO Left 11/11/2019   Procedure:  CATARACT EXTRACTION PHACO AND INTRAOCULAR LENS PLACEMENT ([place]) LEFT VISION BLUE;  Surgeon: Marchia Meiers, MD;  Location: [place];  Service: Ophthalmology;  Laterality: Left;  7.94 0:42.4   CATARACT EXTRACTION W/PHACO Right 12/23/2019   Procedure: CATARACT EXTRACTION PHACO AND INTRAOCULAR LENS PLACEMENT ([place]) RIGHT VISION BLUE 5.47  00:31.1;  Surgeon: Marchia Meiers, MD;  Location: [place];  Service: Ophthalmology;  Laterality: Right;   COLONOSCOPY WITH PROPOFOL N/A 05/08/2021   Procedure: COLONOSCOPY WITH PROPOFOL;  Surgeon: Lesly Rubenstein, MD;  Location: [facility] ENDOSCOPY;  Service: Endoscopy;  Laterality: N/A;   ESOPHAGOGASTRODUODENOSCOPY N/A 05/08/2021   Procedure: ESOPHAGOGASTRODUODENOSCOPY (EGD);  Surgeon: Lesly Rubenstein, MD;  [facility] ENDOSCOPY;  Service: Endoscopy;  Laterality: N/A;   FEMORAL-TIBIAL BYPASS GRAFT Right 01/03/2021   Procedure: BYPASS GRAFT FEMORAL-TIBIAL ARTERY;  Surgeon: Algernon Huxley, MD;  Location: [facility] ORS;  Service: Vascular;  Laterality: Right;   LOWER EXTREMITY ANGIOGRAPHY N/A 10/16/2020   Procedure: Lower Extremity Angiography;  Surgeon: Algernon Huxley, MD;  Location: [place] CV LAB;  Service: Cardiovascular;  Laterality: N/A;   LOWER EXTREMITY ANGIOGRAPHY Right 10/23/2020   Procedure: LOWER EXTREMITY ANGIOGRAPHY;  Surgeon: Algernon Huxley, MD;  Location: [place] CV LAB;  Service: Cardiovascular;  Laterality: Right;   VEIN HARVEST  Right 01/03/2021   Procedure: VEIN HARVEST;  Surgeon: Algernon Huxley, MD;  Location: [facility] ORS;  Service: Vascular;  Laterality: Right;     Allergies  Allergies  Allergen Reactions   Gabapentin Other (See Comments)    Nervous, shaky   Plavix [Clopidogrel Bisulfate] Rash    History of Present Illness    76 y/o ? w/ the above PMH including PAD, HTN, HL, obesity, TIA, iron def anemia, CKD III, tobacco abuse, and angina.  In July 2022, she had 2 hospitalizations.  The first was for syncope in the  setting of hyperthermia and was complicated by severe leg pain in the setting of peripheral arterial disease.  She underwent lower extremity angiography and left lower extremity PTA and stenting.  She had mild troponin elevation and echocardiogram performed during hospitalization showed an EF 60 to 65%.  She was rehospitalized about a week later due to generalized weakness and severe bilateral leg pain.  Revascularization of a chronic total occlusion of the right SFA was attempted however, was unsuccessful.  She subsequently followed with vascular surgery in the outpatient setting with a plan for right femoropopliteal bypass.  She was referred to Dr. Saunders Revel in September 2022 for preoperative evaluation, at which time she reported intermittent lightheadedness and dyspnea on exertion.  She denied chest pain at that time.  She underwent outpatient stress testing, which was low risk without evidence of ischemia or infarct.  She was noted to have mild coronary and aortic calcifications.  She subsequently underwent right femoropopliteal bypass in October 2022, and has since been followed in vascular surgery clinic.  In late November 2022, it was noted that patient was not taking prasugrel as prescribed, and in the setting of right lower extremity swelling with concern for possible DVT she was placed on eliquis 2.5mg  BID (ultrasound without evidence of DVT in the right groin to proximal right thigh, ? Hematoma vs Baker's cyst in popliteal fossa, could not r/o DVT in distal thigh to prox calf due to study limitations).  Jody Lowe over the past several weeks, status had progressive dyspnea on exertion.  She also notes that over the past several months, she periodically experiences rest and exertional heaviness across her chest associate with dyspnea, lasting anywhere from a few minutes to up to an hour, and resolving spontaneously.  She denies any history of palpitations.  On February 11, she presented to the emergency  department due to progressive dyspnea, chest heaviness, and orthostatic lightheadedness.  She thought she was having a panic attack.  She was found to be profoundly anemic with a hemoglobin of 2.9 and hematocrit of 10.9.  ECG was without acute ST or T changes.  Troponin was elevated at 475 with subsequent value of 396.  She was admitted and transfused with packed red blood cells.  Eliquis was held.  EGD was performed on February 14, and was normal.  Colonoscopy was attempted but aborted due to poor prep.  Patient has been maintained on famotidine therapy and Eliquis was resumed at 2.5 mg twice daily.  H&H has been stable and was 9.2 and 29.9 this morning.  On the evening of February 15, just after 5 PM, Jody Lowe developed rapid atrial flutter with rates into the 200s.  This was associated with chest heaviness and dyspnea.  This broke after approximately 4 hours and she has since been on metoprolol therapy.  An echocardiogram was performed on February 16 that shows new LV dysfunction with an EF of 35 to  [id number], grade 2 diastolic dysfunction, normal RV function, RVSP of 36.1 mmHg, and moderate to severe mitral regurgitation.  Though her arrhythmia was quiescent throughout the day on February 16, overnight, she did have paroxysms of atrial flutter generally lasting a few minutes.  She denied any palpitations or chest heaviness overnight.  Inpatient Medications     amitriptyline  25 mg Oral QHS   apixaban  2.5 mg Oral BID   [START ON 05/12/2021] vitamin C  500 mg Oral Daily   aspirin EC  81 mg Oral Daily   cholecalciferol  1,000 Units Oral Daily   famotidine  20 mg Oral Daily   feeding supplement  237 mL Oral TID BM   FLUoxetine  80 mg Oral Daily   folic acid  1 mg Oral Daily   [START ON 05/12/2021] iron polysaccharides  150 mg Oral Daily   [START ON 05/12/2021] magnesium oxide  400 mg Oral BID   mouth rinse  15 mL Mouth Rinse BID   metoprolol tartrate  25 mg Oral BID   multivitamin with minerals  1 tablet  Oral Daily   oxybutynin  10 mg Oral Daily    Family History    Family History  Problem Relation Age of Onset   Heart attack Mother    Heart disease Sister    Hypertension Sister    She indicated that her mother is deceased. She indicated that her father is deceased. She indicated that the status of her sister is unknown.   Social History    Social History   Socioeconomic History   Marital status: Single    Spouse name: Not on file   Number of children: Not on file   Years of education: Not on file   Highest education level: Not on file  Occupational History   Not on file  Tobacco Use   Smoking status: Some Days    Packs/day: 0.10    Types: Cigarettes   Smokeless tobacco: Never  Vaping Use   Vaping Use: Former  Substance and Sexual Activity   Alcohol use: No   Drug use: No   Sexual activity: Not on file  Other Topics Concern   Not on file  Social History Narrative   Lives in [place], [place] by herself.  Does not routinely exercise.  Uses walker and/or wheelchair.  Son nearby.   Social Determinants of Health   Financial Resource Strain: Not on file  Food Insecurity: Not on file  Transportation Needs: Not on file  Physical Activity: Not on file  Stress: Not on file  Social Connections: Not on file  Intimate Partner Violence: Not on file     Review of Systems    General:  No chills, fever, night sweats or weight changes.  Cardiovascular:  +++ Intermittent rest and exertional chest heaviness, +++ dyspnea on exertion, no edema, orthopnea, palpitations, paroxysmal nocturnal dyspnea. Dermatological: No rash, lesions/masses Respiratory: No cough, +++ dyspnea Urologic: No hematuria, dysuria Abdominal:   No nausea, vomiting, diarrhea, long history of melena.  No bright red blood per rectum.  No hematemesis Neurologic:  No visual changes, wkns, changes in mental status. All other systems reviewed and are otherwise negative except as noted above.  Physical Exam     Blood pressure 131/69, pulse 67, temperature 98.2 F (36.8 C), temperature source Oral, resp. rate 14, height 5\' 6"  (1.676 m), weight 95.1 kg, SpO2 99 %.  General: Pleasant, NAD Psych: Normal affect. Neuro: Alert and oriented X 3. Moves all  extremities spontaneously. HEENT: Normal  Neck: Supple without bruits or JVD. Lungs:  Resp regular and unlabored, CTA.  Markedly diminished breath sound bilaterally. Heart: RRR no s3, s4, 1/6 systolic murmur at the apex. Abdomen: Obese, soft, non-tender, non-distended, BS + x 4.  Extremities: No clubbing, cyanosis or edema. Radials 2+ and equal bilaterally.  Labs    Cardiac Enzymes Recent Labs  Lab 05/05/21 1634 05/05/21 1800 05/09/21 1816 05/09/21 1942  TROPONINIHS 475* 396* 63* 65*      Lab Results  Component Value Date   WBC 11.3 (H) 05/11/2021   HGB 9.2 (L) 05/11/2021   HCT 29.9 (L) 05/11/2021   MCV 77.3 (L) 05/11/2021   PLT 563 (H) 05/11/2021    Recent Labs  Lab 05/11/21 0506  NA 134*  K 4.1  CL 106  CO2 21*  BUN 19  CREATININE 0.98  CALCIUM 9.6  PROT 6.6  BILITOT 0.6  ALKPHOS 126  ALT 64*  AST 14*  GLUCOSE 111*   Lab Results  Component Value Date   CHOL 89 10/16/2020   HDL 40 (L) 10/16/2020   LDLCALC 41 10/16/2020   TRIG 42 10/16/2020    Radiology Studies    DG Chest 2 View  Result Date: 05/05/2021 CLINICAL DATA:  Shortness of breath.  Anxiety. EXAM: CHEST - 2 VIEW COMPARISON:  02/22/2020 FINDINGS: Stable cardiomediastinal contours. No signs of pleural effusion. No airspace consolidation. Increased reticular interstitial opacities are identified bilaterally. Findings may reflect mild interstitial edema, atypical infection or chronic lung disease. IMPRESSION: Increased reticular interstitial opacities are noted bilaterally with differential as discussed above. Electronically Signed   By: Kerby Moors M.D.   On: 05/05/2021 16:47   DG Chest Port 1 View  Result Date: 05/09/2021 CLINICAL DATA:  Tachycardia EXAM:  PORTABLE CHEST 1 VIEW COMPARISON:  05/08/2021, 02/22/2020 FINDINGS: Cardiomegaly with vascular congestion and diffuse interstitial opacity probably mild edema. More focal airspace disease at left base. Aortic atherosclerosis. IMPRESSION: 1. Cardiomegaly with vascular congestion mild diffuse interstitial opacity most likely edema 2. Increasing airspace disease at left lung base could reflect superimposed pneumonia Electronically Signed   By: Donavan Foil M.D.   On: 05/09/2021 23:06   DG Chest Port 1 View  Result Date: 05/08/2021 CLINICAL DATA:  dyspnea EXAM: PORTABLE CHEST 1 VIEW COMPARISON:  05/05/2021 FINDINGS: Normal cardiac silhouette. Lungs are mildly hyperinflated. There is diffuse interstitial markings which are increased from 1 day prior. No focal consolidation. Chronic elevation LEFT hemidiaphragm with mild atelectasis IMPRESSION: Increased interstitial markings with rapid suggest interstitial edema. Electronically Signed   By: Suzy Bouchard M.D.   On: 05/08/2021 11:39    US Abdomen Limited RUQ (LIVER/GB)  Result Date: 05/06/2021 CLINICAL DATA:  Elevated liver function test. History of a cholecystectomy. EXAM: ULTRASOUND ABDOMEN LIMITED RIGHT UPPER QUADRANT COMPARISON:  None. FINDINGS: Gallbladder: Surgically absent. Common bile duct: Diameter: 6 mm Liver: Mildly coarsened echotexture. Normal overall size. Subtle surface nodularity. No masses. Portal vein is patent on color Doppler imaging with normal direction of blood flow towards the liver. Other: Trace free fluid in the right upper quadrant. IMPRESSION: 1. No acute findings. 2. Liver appearance raises suspicion for cirrhosis.  No liver mass. 3. Trace ascites. Electronically Signed   By: Lajean Manes M.D.   On: 05/06/2021 16:39    ECG & Cardiac Imaging    Atrial flutter, 120, nonspecific ST/T changes - personally reviewed.  Tele  - On February 15, rapid atrial flutter to approximately 210 bpm.  Atrial flutter  persisted times  approximately 4 and half hours though rates did come down into the 130s to 140s - sinus rhythm w/ frequent, relatively brief paroxysms of atrial flutter into the 140s overnight.  Assessment & Plan    1.  Paroxysmal atrial flutter: On the evening of February 15, patient developed rapid atrial flutter with rates into the 200s.  This eventually broke after about 4-1/2 hours.  ECG showed atrial flutter 120 bpm with nonspecific ST and T changes during that time span.  Patient did experience chest heaviness and dyspnea but denied palpitations.  Troponins, which were elevated on admission in the setting of profound anemia, were still mildly elevated with a flat trend of 63  65.  Follow-up echo shows newly depressed EF of 35 to 40% with moderate to severe mitral regurgitation.  Arrhythmia was quiescent throughout the day on February 16 but she has had paroxysmal atrial flutter overnight, which was seemingly asymptomatic, and fairly short-lived (minutes at a time), with rates into the 140s.  With multiple competing morbidities at this time, she is high risk for recurrent arrhythmias and therefore, we will add amiodarone 2 mg twice daily.  Continue beta-blocker.  CHA2DS2-VASc equals 7 and she has been taking Eliquis 2.5 mg twice daily in the outpatient setting the setting of severe peripheral arterial disease.  Though based on her age and weight, she qualifies for 5 mg twice daily, in the setting of recent GI bleeding and anemia, we will not advance her Eliquis dose at this time.  TSH is pending.  Supplement magnesium (1.5).  2.  Cardiomyopathy: Echo in July showed normal LV function however repeat echo on February 16 shows an EF of 35 to 40% with grade 2 diastolic dysfunction, and moderate to severe mitral regurgitation.  RVSP was elevated at 36.1 mmHg.  Her BNP was also elevated.  Body habitus makes exam somewhat challenging, though chest x-rays this admission have suggested interstitial edema.  Renal function has  been stable.  I will order intravenous Lasix today.  She previously had an ischemic evaluation with stress testing in September 2022, which was nonischemic.  At this time, she is a poor candidate for repeat ischemic evaluation due to presentation with acute profound anemia.  Can reconsider ischemic evaluation in the outpatient setting.  Transition metoprolol to toprol xl.  If renal fxn stable after diuresis, will add ARB tomorrow.    3.  Acute on chronic iron deficiency anemia/melena: Patient with a long history of melena.  Admitted with a hemoglobin of 2.9 and hematocrit of 10.9.  Status post transfusions.  H&H have been stable on Eliquis and H2 blocker therapy.  EGD normal.  Colonoscopy aborted secondary to poor prep.  Prior history of AVMs.  Management per medicine and GI.  4.  Essential hypertension: Relatively stable.  Continue beta-blocker therapy.  Follow with diuresis consider ARB in the setting of cardiomyopathy.  5.  Peripheral arterial disease: Status post left-sided intervention in July with right femoropopliteal bypass in October.  On aspirin and low-dose Eliquis.  6.  Hyperlipidemia: On statin in outpatient setting though this has been on hold in the setting of elevated LFTs.  Plan to resume once stable.  7.  Stage III chronic kidney disease: Creatinine has been stable.  8.  Moderate to severe mitral regurgitation: Likely functional in the setting of reduced LV function-?  Tachycardia induced cardiomyopathy.  We will follow in the outpatient setting.  9.  Tobacco abuse: Says she quit last fall.  10.  Hypomagnesemia: Magnesium 1.5.  Supplementation ordered.  Signed, Murray Hodgkins, NP 05/11/2021, 1:18 PM  For questions or updates, please contact   Please consult [email or web address] for contact info under Cardiology/STEMI.

## 2021-05-11 NOTE — Progress Notes (Signed)
*  PRELIMINARY RESULTS* Echocardiogram 2D Echocardiogram has been performed.  Jody Lowe 05/11/2021, 7:53 AM

## 2021-05-11 NOTE — Progress Notes (Signed)
Progress Note   Patient: Jody Lowe [id number] DOB: [birth year] DOA: 05/05/2021     6 DOS: the patient was seen and examined on 05/11/2021   Brief hospital course: 76 year old female with history of iron deficiency anemia, previously on iron tablets, B12 deficiency anemia, generalized weakness, hyperlipidemia, depressive disorder, hypertension, tobacco use disorder, PAD, obesity, history of TIA, CKD 3A presented with concern for anxiety attack along with melena for several years.  On presentation, sodium was 129, creatinine of 1.60, bicarb of 13, WBC of 9, hemoglobin of 2.9 with hematocrit of 10.9 and platelets of 638.  High-sensitivity troponin was 475.  She received packed red cell transfusion.  She was started on IV Protonix and GI was consulted.  Assessment and Plan: * Acute anemia- (present on admission) Acute on chronic severe anemia -Hemoglobin 2.9 on admission.  Status post 4 units packed red cell transfusion.  Hemoglobin 8.2 today. Monitor H&H.  Hypomagnesemia Magnesium repleted, continue to monitor and replete as needed.  Paroxysmal A-fib ([place]) 2/15 patient developed A-fib with RVR, Cardizem 10 mg IV x1 dose given and started on Lopressor.  Patient converted back to normal sinus rhythm Patient is already on DOAC,  2D echo shows decreased LVEF 35 to [id number], grade 2 diastolic dysfunction,  Moderate to severe mitral valve regurgitation.  Prior 2D echocardiogram showed EF 65% and grade 1 diastolic dysfunction.  Due to reduced LV function cardiology has been consulted for further work-up and management.      Folate deficiency Folic acid level 5.8, started folic acid 1 mg p.o. daily, follow with PCP to repeat folate level after 3 months.  Leukocytosis - Possibly reactive.  No signs of infection.  WBC count trended down to normal, blood culture NGTD    Hypokalemia - Improved  Thrombocytosis- (present on admission) - Possibly reactive.  Monitor  Elevated LFTs- (present on  admission) - Questionable cause.  Right upper quadrant ultrasound showed liver appearance suspicious for cirrhosis but no masses along with trace ascites.  LFTs pending for today.  Repeat a.m. LFTs.  Follow GI recommendations.  Hepatitis panel negative. -Statin on hold  Acute metabolic acidosis- (present on admission) - Possibly from AKI and poor oral intake. -Resolved  Hyponatremia- (present on admission) - Possibly from poor oral intake.  Sodium improving to 134 today.  Off IV fluids.  Repeat a.m. labs.  GI bleed- (present on admission) -Patient apparently has had melena for years GI consulted, s/p EGD negative, colonoscopy poor prep, GI recommended to continue DOAC and aspirin, benefit outweighs the risk of bleeding.  GI bleeding could be secondary to prior history of AVM Follow CBC daily Follow GI as an outpatient for repeat colonoscopy S/p IV Protonix, switch to pantoprazole 40 mg p.o. daily   Hyperlipidemia- (present on admission) - Hold statin due to elevated LFTs  Generalized weakness - PT recommends SNF placement.  Social worker consult.  Elevated troponin- (present on admission) - Possibly from demand ischemia from anemia.  Troponin 475 on presentation, did not trend upwards and repeat troponin was 396. -No chest pain.  No further work-up needed. 2/17 cardiology consulted for further recommendation  AKI (acute kidney injury) ([place])- (present on admission) - Possibly prerenal.  Creatinine improving.  Repeat a.m. labs  Depressive disorder- (present on admission) - Continue home regimen.  Outpatient follow-up with PCP or psychiatry  Hypertension, benign- (present on admission) - Blood pressure intermittently on the lower side..  Monitor. 2/15 started Lopressor 25 mg twice daily due to A-fib with RVR, patient  converted back to normal sinus rhythm  Iron deficiency anemia due to chronic blood loss- (present on admission) Iron saturation 4%, iron level 14 Started Venofer  200 mg IV during hospital stay, start oral supplement on discharge with vitamin C.  Follow with PCP and repeat iron profile after 3 months.  B12 deficiency-resolved as of 05/09/2021, (present on admission) - Continue supplementation.  Discontinued due to a supplement, B12 level is elevated 1350, follow with PCP to repeat vitamin B12 level after 6 months      Subjective: No significant overnight events, patient denied any GI bleeding.  Denied any chest pain or palpitation, no nausea/vomiting or diarrhea.  Physical Exam: Vitals:   05/10/21 1714 05/10/21 2037 05/11/21 0400 05/11/21 0811  BP: (!) 145/74 138/77 (!) 147/77 131/69  Pulse: 79 83 83 67  Resp: 17 18 16 14   Temp: 99.2 F (37.3 C) 98.8 F (37.1 C) 98.5 F (36.9 C) 98.2 F (36.8 C)  TempSrc: Oral  Oral Oral  SpO2: 100% 100% 99% 99%  Weight:      Height:        Physical Exam: General:  alert oriented to time, place, and person.  Appear in no distress, affect appropriate Eyes: PERRLA ENT: Oral Mucosa Clear, moist  Neck: no JVD,  Cardiovascular: S1 and S2 Present, no Murmur,  Respiratory: good respiratory effort, Bilateral Air entry equal and Decreased, no Crackles, no wheezes Abdomen: Bowel Sound present, Soft and no tenderness,  Skin: no rashes Extremities: no Pedal edema, no calf tenderness Neurologic: without any new focal findings Gait not checked due to patient safety concerns  Data Reviewed:  Labs reviewed and analyzed Hb 8.6 stable WBC 10 wnl Sodium 132 Creatinine 1.11 slightly elevated LFTs improving Iron 4%, folic acid 5.8 low  Family Communication: Discussed with family at bedside 2/15,  Nobody at bedside today  Disposition: Status is: Inpatient Remains inpatient appropriate because: GI bleeding and low hemoglobin Resumed anticoagulation as per GI, monitor CBC Plan for discharge in 1 to 2 days if Hb remains stable, cardiology has been consulted due to low EF, and then we will plan for  disposition after clearance  PT OT eval recommended SNF placement   Planned Discharge Destination: [facility] approved, patient will be discharged tomorrow a.m. to SNF   DVT Prophylaxis  .Apixaban (eliquis) tablet 2.5 mg , Place ted hose    Time spent: 40 minutes  Author: Val Riles, MD 05/11/2021 2:07 PM  For on call review [email or web address].

## 2021-05-11 NOTE — Progress Notes (Signed)
PT Cancellation Note  Patient Details Name: Jody Lowe MRN: [id number] DOB: 1945/12/01   Cancelled Treatment:     Therapist in to see pt for skilled PT services. Pt refused, stating she didn't feel well, encouraged pt to complete strengthening exercises in bed, pt continued to decline. Pt with possible d/c to SNF today.  Continue per POC next available date/time.    Jannet Askew 05/11/2021, 12:17 PM

## 2021-05-11 NOTE — Assessment & Plan Note (Signed)
Magnesium repleted, continue to monitor and replete as needed.

## 2021-05-11 NOTE — TOC Progression Note (Signed)
Transition of Care [facility]) - Progression Note    Patient Details  Name: Jody Lowe MRN: [id number] Date of Birth: 1945/03/28  Transition of [facility] CM/SW Contact  Chapman Fitch, RN Phone Number: 05/11/2021, 2:21 PM  Clinical Narrative:    Vesta Mixer ID [id number] Valid 2/17-2/21 Per Tammy at [facility] if patient does not discharge today, they can admit over the weekend        Expected Discharge Plan and Services                                                 Social Determinants of Health (SDOH) Interventions    Readmission Risk Interventions Readmission Risk Prevention Plan 01/05/2021  Transportation Screening Complete  PCP or Specialist Appt within 3-5 Days Complete  HRI or Home Care Consult Complete  Social Work Consult for Recovery Care Planning/Counseling Complete  Palliative Care Screening Not Applicable  Medication Review [employer]) Complete  Some recent data might be hidden

## 2021-05-11 NOTE — Care Management Important Message (Signed)
Important Message  Patient Details  Name: Jody Lowe MRN: [id number] Date of Birth: September 23, 1945   Medicare Important Message Given:  Yes     [name] 05/11/2021, 11:49 AM

## 2021-05-12 DIAGNOSIS — M25561 Pain in right knee: Secondary | ICD-10-CM

## 2021-05-12 DIAGNOSIS — R0600 Dyspnea, unspecified: Secondary | ICD-10-CM

## 2021-05-12 DIAGNOSIS — R000 Tachycardia, unspecified: Secondary | ICD-10-CM

## 2021-05-12 DIAGNOSIS — D500 Iron deficiency anemia secondary to blood loss (chronic): Secondary | ICD-10-CM

## 2021-05-12 DIAGNOSIS — F17200 Nicotine dependence, unspecified, uncomplicated: Secondary | ICD-10-CM

## 2021-05-12 DIAGNOSIS — I480 Paroxysmal atrial fibrillation: Secondary | ICD-10-CM

## 2021-05-12 DIAGNOSIS — R079 Chest pain, unspecified: Secondary | ICD-10-CM

## 2021-05-12 DIAGNOSIS — G8929 Other chronic pain: Secondary | ICD-10-CM

## 2021-05-12 DIAGNOSIS — M25562 Pain in left knee: Secondary | ICD-10-CM

## 2021-05-12 DIAGNOSIS — R0689 Other abnormalities of breathing: Secondary | ICD-10-CM

## 2021-05-12 LAB — CBC
HCT: 27 % — ABNORMAL LOW (ref 36.0–46.0)
Hemoglobin: 8.4 g/dL — ABNORMAL LOW (ref 12.0–15.0)
MCH: 23.9 pg — ABNORMAL LOW (ref 26.0–34.0)
MCHC: 31.1 g/dL (ref 30.0–36.0)
MCV: 76.7 fL — ABNORMAL LOW (ref 80.0–100.0)
Platelets: 490 10*3/uL — ABNORMAL HIGH (ref 150–400)
RBC: 3.52 MIL/uL — ABNORMAL LOW (ref 3.87–5.11)
RDW: 22.9 % — ABNORMAL HIGH (ref 11.5–15.5)
WBC: 9.8 10*3/uL (ref 4.0–10.5)
nRBC: 0.4 % — ABNORMAL HIGH (ref 0.0–0.2)

## 2021-05-12 LAB — HEPATIC FUNCTION PANEL
ALT: 44 U/L (ref 0–44)
AST: 13 U/L — ABNORMAL LOW (ref 15–41)
Albumin: 2.3 g/dL — ABNORMAL LOW (ref 3.5–5.0)
Alkaline Phosphatase: 108 U/L (ref 38–126)
Bilirubin, Direct: 0.2 mg/dL (ref 0.0–0.2)
Indirect Bilirubin: 0.3 mg/dL (ref 0.3–0.9)
Total Bilirubin: 0.5 mg/dL (ref 0.3–1.2)
Total Protein: 5.9 g/dL — ABNORMAL LOW (ref 6.5–8.1)

## 2021-05-12 LAB — BASIC METABOLIC PANEL
Anion gap: 5 (ref 5–15)
BUN: 19 mg/dL (ref 8–23)
CO2: 23 mmol/L (ref 22–32)
Calcium: 9.1 mg/dL (ref 8.9–10.3)
Chloride: 106 mmol/L (ref 98–111)
Creatinine, Ser: 1.1 mg/dL — ABNORMAL HIGH (ref 0.44–1.00)
GFR, Estimated: 52 mL/min — ABNORMAL LOW (ref >60)
Glucose, Bld: 94 mg/dL (ref 70–99)
Potassium: 3.7 mmol/L (ref 3.5–5.1)
Sodium: 134 mmol/L — ABNORMAL LOW (ref 135–145)

## 2021-05-12 LAB — MAGNESIUM: Magnesium: 1.6 mg/dL — ABNORMAL LOW (ref 1.7–2.4)

## 2021-05-12 LAB — PHOSPHORUS: Phosphorus: 3.9 mg/dL (ref 2.5–4.6)

## 2021-05-12 MED ORDER — FUROSEMIDE 40 MG PO TABS: 40.0000 mg | ORAL_TABLET | Freq: Every day | ORAL | Status: DC

## 2021-05-12 MED ORDER — MAGNESIUM OXIDE -MG SUPPLEMENT 400 (240 MG) MG PO TABS
400.0000 mg | ORAL_TABLET | Freq: Two times a day (BID) | ORAL | 2 refills | Status: AC
Start: 2021-05-13 — End: 2021-08-11

## 2021-05-12 MED ORDER — POLYSACCHARIDE IRON COMPLEX 150 MG PO CAPS: 150.0000 mg | ORAL_CAPSULE | Freq: Every day | ORAL | Status: AC

## 2021-05-12 MED ORDER — MAGNESIUM SULFATE 4 GM/100ML IV SOLN
4.0000 g | Freq: Once | INTRAVENOUS | Status: AC
  Administered 2021-05-12: 4 g via INTRAVENOUS
  Filled 2021-05-12: qty 100

## 2021-05-12 MED ORDER — ADULT MULTIVITAMIN W/MINERALS CH: 1.0000 | ORAL_TABLET | Freq: Every day | ORAL | 2 refills | Status: AC

## 2021-05-12 MED ORDER — LOSARTAN POTASSIUM 25 MG PO TABS: 12.5000 mg | ORAL_TABLET | Freq: Every day | ORAL | Status: DC

## 2021-05-12 MED ORDER — FOLIC ACID 1 MG PO TABS
1.0000 mg | ORAL_TABLET | Freq: Every day | ORAL | 2 refills | Status: AC
Start: 2021-05-13 — End: 2021-08-11

## 2021-05-12 MED ORDER — ATORVASTATIN CALCIUM 10 MG PO TABS: 10.0000 mg | ORAL_TABLET | Freq: Every day | ORAL | Status: AC

## 2021-05-12 MED ORDER — FUROSEMIDE 20 MG PO TABS
20.0000 mg | ORAL_TABLET | Freq: Once | ORAL | Status: AC
  Administered 2021-05-12: 20 mg via ORAL
  Filled 2021-05-12: qty 1

## 2021-05-12 MED ORDER — METOPROLOL SUCCINATE ER 50 MG PO TB24: 50.0000 mg | ORAL_TABLET | Freq: Every day | ORAL | Status: AC

## 2021-05-12 MED ORDER — ASCORBIC ACID 500 MG PO TABS: 500.0000 mg | ORAL_TABLET | Freq: Every day | ORAL | 2 refills | Status: AC

## 2021-05-12 MED ORDER — AMIODARONE HCL 200 MG PO TABS: ORAL_TABLET | ORAL | 1 refills | Status: AC

## 2021-05-12 MED ORDER — FUROSEMIDE 40 MG PO TABS: 40.0000 mg | ORAL_TABLET | Freq: Every day | ORAL | Status: AC

## 2021-05-12 MED ORDER — FUROSEMIDE 20 MG PO TABS
20.0000 mg | ORAL_TABLET | Freq: Every day | ORAL | Status: DC
  Administered 2021-05-12: 20 mg via ORAL
  Filled 2021-05-12: qty 1

## 2021-05-12 MED ORDER — MAGNESIUM OXIDE -MG SUPPLEMENT 400 (240 MG) MG PO TABS: 400.0000 mg | ORAL_TABLET | Freq: Two times a day (BID) | ORAL | Status: DC

## 2021-05-12 MED ORDER — FUROSEMIDE 20 MG PO TABS: 20.0000 mg | ORAL_TABLET | Freq: Every day | ORAL | Status: DC

## 2021-05-12 NOTE — Discharge Summary (Addendum)
[facility] Discharge Summary   Patient: Jody Lowe  PCP: Tomasita Morrow, MD  Date of admission: 05/05/2021   Date of discharge:  05/12/2021     Discharge Diagnoses:  Principal Problem:   Acute anemia Active Problems:   Tobacco use disorder   PAD (peripheral artery disease) (HCC)   Iron deficiency anemia due to chronic blood loss   Hypertension, benign   Depressive disorder   AKI (acute kidney injury) ([place])   Elevated troponin   Chronic pain of both knees   Generalized weakness   Hyperlipidemia   Obesity   GI bleed   Hyponatremia   Acute metabolic acidosis   Elevated LFTs   Thrombocytosis   Hypokalemia   Leukocytosis   Folate deficiency   Paroxysmal A-fib (HCC)   Hypomagnesemia   Admitted From: Home Disposition:  SNF   Recommendations for Outpatient Follow-up:  Follow with PCP, patient should be seen by an MD in 1 to 2 days, continue monitor BP and titrate medications accordingly.  Repeat CBC and BMP in 1 week to check renal functions and hemoglobin. Follow-up with cardiology in 2 to 3 weeks for further management of paroxysmal A-fib and CHF, patient will need cardiac cath due to CHF and mitral valve regurgitation as an outpatient Follow GI for colonoscopy as an outpatient, return back to ED if any signs of bleeding Patient is on aspirin and low-dose Eliquis, if no bleeding and GI work-up is negative then cardiology planning to increase Eliquis from 2.5 to 5 mg p.o. twice daily full dose. Follow up LABS/TEST: CBC and BMP after 1 week  Diet recommendation: Cardiac diet  Activity: The patient is advised to gradually reintroduce usual activities, as tolerated  Discharge Condition: stable  Code Status: Full code   History of present illness: As per the H and P dictated on admission Hospital Course:  76 year old female with history of iron deficiency anemia, previously on iron tablets, B12 deficiency anemia, generalized weakness,  hyperlipidemia, depressive disorder, hypertension, tobacco use disorder, PAD, obesity, history of TIA, CKD 3A presented with concern for anxiety attack along with melena for several years.  On presentation, sodium was 129, creatinine of 1.60, bicarb of 13, WBC of 9, hemoglobin of 2.9 with hematocrit of 10.9 and platelets of 638.  High-sensitivity troponin was 475.  She received packed red cell transfusion. She was started on IV Protonix and GI was consulted. Assessment and Plan: Acute anemia- (POA), Acute on chronic severe anemia, Hb 2.9 on admission.  Status post 4 units packed red cell transfusion.  Hemoglobin 8.4 today. . GI bleed- (POA), Patient apparently has had melena for years. GI consulted, s/p EGD negative, colonoscopy poor prep, GI recommended to continue DOAC and aspirin, benefit outweighs the risk of bleeding.  GI bleeding could be secondary to prior history of AVM.  H&H remained stable after starting Eliquis 2.5 mg twice daily and aspirin. Follow GI as an outpatient for repeat colonoscopy. S/p IV Protonix, continued omeprazole 40 mg p.o. daily.  Dose.   Hypomagnesemia Magnesium repleted, continue magnesium oxide 400 twice daily to prevent hypomagnesemia.  It might be secondary to long-term use of PPI but patient needs it due to GI bleeding, consider discontinuing PPI and switching to Pepcid after follow-up with GI as an outpatient. Paroxysmal A-fib, new onset during hospital stay. 2/15 patient developed A-fib with RVR, Cardizem 10 mg IV x1 dose given and started on Lopressor.  Patient converted back to normal sinus rhythm. Patient is already on DOAC,  2D echo shows decreased LVEF 35 to [id number], grade 2 diastolic dysfunction,  Moderate to severe mitral valve regurgitation.  Prior 2D echocardiogram showed EF 65% and grade 1 diastolic dysfunction. Due to reduced LV function, paroxysmal A-fib and moderate to severe MR cardiology consulted, recommended to continue Toprol-XL 50 mg p.o. daily, Lasix 40 mg  p.o. daily, and losartan when blood pressure is stable, started amiodarone 200 mg p.o. twice daily for 4 to 6 weeks followed by 200 mg p.o. daily which can be converted as an outpatient after follow-up with cardiology.  Patient will need cardiac cath which will be scheduled as an outpatient.  Folate deficiency Folic acid level 5.8, started folic acid 1 mg p.o. daily, follow with PCP to repeat folate level after 3 months. Leukocytosis - Possibly reactive.  No signs of infection.  WBC count trended down to normal, blood culture NGTD Hypokalemia, Improved Thrombocytosis- (POA), Possibly reactive.  Improving, continue to monitor Elevated LFTs- (POA), Questionable cause.  Right upper quadrant ultrasound showed liver appearance suspicious for cirrhosis but no masses along with trace ascites. Hepatitis panel negative. Statin held during hospital stay, LFTs improved.  Decreased dose of Lipitor from 20 to 10 mg p.o. daily on discharge.  Repeat LFTs after 1 month Acute metabolic acidosis- (POA), Possibly from AKI and poor oral intake. Resolved Hyponatremia- (POA) Possibly from poor oral intake.  Sodium improving to 134 today.  Off IV fluids. Hyperlipidemia- (POA), due to elevated LFTs, statin was held during hospital stay, LFTs improved, resumed Lipitor at lower dose from 10 to 20 mg, check LFTs after 1 month and check lipid profile after 3 months.  Generalized weakness, - PT recommends SNF placement, which was arranged. Elevated troponin- (POA) Possibly from demand ischemia from anemia.  Troponin 475 on presentation, did not trend upwards and repeat troponin was 396. No chest pain.  2D echocardiogram showed low LVEF and moderate to severe MR, cardiology recommended cardiac catheterization/angiogram as an outpatient.  Continued aspirin and statin. AKI (acute kidney injury) ([place])- (POA) Possibly prerenal.  Creatinine improving, repeat BMP after 1 week to check kidney functions, patient has been started on Lasix 40  mg pod as per cardiology. Depressive disorder- (POA) Continue home regimen.  Outpatient follow-up with PCP or psychiatry Iron deficiency anemia due to chronic blood loss- (POA) Iron saturation 4%, iron level 14, s/p Venofer 200 mg IV during hospital stay, started oral supplement on discharge with vitamin C.  Follow with PCP and repeat iron profile after 3 months. History of B12 deficiency-resolved as of 05/09/2021,  Discontinued due to a supplement, B12 level is elevated 1350, follow with PCP to repeat vitamin B12 level after 6 months Body mass index is 32.74 kg/m.  Nutrition Problem: Inadequate oral intake Etiology: acute illness Nutrition Interventions:    Patient was seen by physical therapy, who recommended Therapy, which was arranged. On the day of the discharge the patient's vitals were stable, and no other acute medical condition were reported by patient. the patient was felt safe to be discharge at SNF with Therapy.  Consultants: Cardiology and GI Procedures: EGD and colonoscopy  Discharge Exam: General: Appear in no distress, no Rash; Oral Mucosa Clear, moist. Cardiovascular: S1 and S2 Present, no Murmur, Respiratory: normal respiratory effort, Bilateral Air entry present and no Crackles, no wheezes Abdomen: Bowel Sound present, Soft and no tenderness, no hernia Extremities: no Pedal edema, no calf tenderness Neurology: alert and oriented to time, place, and person affect appropriate.  Filed Weights   05/05/21 1547  05/10/21 0300 05/12/21 0500  Weight: 90.7 kg 95.1 kg 92 kg   Vitals:   05/12/21 0732 05/12/21 0959  BP: (!) 116/50 (!) 101/44  Pulse: 70   Resp: 18   Temp: 98.5 F (36.9 C)   SpO2: 98%     DISCHARGE MEDICATION: Allergies as of 05/12/2021       Reactions   Gabapentin Other (See Comments)   Nervous, shaky   Plavix [clopidogrel Bisulfate] Rash        Medication List     STOP taking these medications    amitriptyline 25 MG tablet Commonly known  as: ELAVIL   cephALEXin 500 MG capsule Commonly known as: KEFLEX   cyanocobalamin 1000 MCG tablet   ferrous sulfate 325 (65 FE) MG tablet   meclizine 25 MG tablet Commonly known as: ANTIVERT   oxyCODONE-acetaminophen 5-325 MG tablet Commonly known as: PERCOCET/ROXICET       TAKE these medications    albuterol 108 (90 Base) MCG/ACT inhaler Commonly known as: VENTOLIN HFA Inhale 1-2 puffs into the lungs every 6 (six) hours as needed for wheezing or shortness of breath.   alendronate 70 MG tablet Commonly known as: FOSAMAX Take 70 mg by mouth once a week.   amiodarone 200 MG tablet Commonly known as: PACERONE Take 1 tablet (200 mg total) by mouth 2 (two) times daily for 42 days, THEN 1 tablet (200 mg total) daily. Start taking on: May 12, 2021   apixaban 2.5 MG Tabs tablet Commonly known as: Eliquis Take 1 tablet (2.5 mg total) by mouth 2 (two) times daily.   ascorbic acid 500 MG tablet Commonly known as: VITAMIN C Take 1 tablet (500 mg total) by mouth daily. Start taking on: May 13, 2021   aspirin EC 81 MG tablet Take 81 mg by mouth daily.   atorvastatin 10 MG tablet Commonly known as: LIPITOR Take 1 tablet (10 mg total) by mouth at bedtime. What changed:  medication strength how much to take   diclofenac Sodium 1 % Gel Commonly known as: VOLTAREN Apply topically 4 (four) times daily as needed.   esomeprazole 40 MG capsule Commonly known as: NEXIUM Take 40 mg by mouth daily.   FLUoxetine 40 MG capsule Commonly known as: PROZAC Take 2 capsules (80 mg total) by mouth daily. Take one capsule (40 mg) by mouth once daily for one month, then increase to two capsules (80 mg) thereafter   fluticasone 50 MCG/ACT nasal spray Commonly known as: FLONASE Place 1 spray into both nostrils daily as needed for allergies or rhinitis.   folic acid 1 MG tablet Commonly known as: FOLVITE Take 1 tablet (1 mg total) by mouth daily. Start taking on: May 13, 2021   furosemide 40 MG tablet Commonly known as: LASIX Take 1 tablet (40 mg total) by mouth daily. Start taking on: May 13, 2021   [facility] Hospital[facility]NP Vitamin D 25 MCG (1000 UT) tablet Generic drug: Cholecalciferol Take 1,000 Units by mouth daily.   ipratropium 0.03 % nasal spray Commonly known as: ATROVENT Place 2 sprays into both nostrils 3 (three) times daily as needed for rhinitis.   iron polysaccharides 150 MG capsule Commonly known as: NIFEREX Take 1 capsule (150 mg total) by mouth daily. Start taking on: May 14, 2021   magnesium oxide 400 (240 Mg) MG tablet Commonly known as: MAG-OX Take 1 tablet (400 mg total) by mouth 2 (two) times daily. Start taking on: May 13, 2021   metoprolol succinate 50 MG 24 hr  tablet Commonly known as: TOPROL-XL Take 1 tablet (50 mg total) by mouth daily. Take with or immediately following a meal. Start taking on: May 13, 2021   multivitamin with minerals Tabs tablet Take 1 tablet by mouth daily. Start taking on: May 13, 2021   nystatin powder Commonly known as: MYCOSTATIN/NYSTOP Apply 1 application topically 2 (two) times daily as needed.   oxybutynin 10 MG 24 hr tablet Commonly known as: DITROPAN-XL Take 1 tablet by mouth daily.   polyethylene glycol 17 g packet Commonly known as: MIRALAX / GLYCOLAX Take 17 g by mouth daily as needed for mild constipation.   Santyl ointment Generic drug: collagenase Apply 1 application topically daily.   senna-docusate 8.6-50 MG tablet Commonly known as: Senokot-S Take 2 tablets by mouth daily as needed for mild constipation.   silver sulfADIAZINE 1 % cream Commonly known as: SILVADENE Apply 1 application topically daily.       Allergies  Allergen Reactions   Gabapentin Other (See Comments)    Nervous, shaky   Plavix [Clopidogrel Bisulfate] Rash   Discharge Instructions     Call MD for:  difficulty breathing, headache or visual disturbances   Complete by: As directed     Call MD for:  extreme fatigue   Complete by: As directed    Call MD for:  persistant dizziness or light-headedness   Complete by: As directed    Call MD for:  persistant nausea and vomiting   Complete by: As directed    Call MD for:  severe uncontrolled pain   Complete by: As directed    Call MD for:  temperature >100.4   Complete by: As directed    Diet - low sodium heart healthy   Complete by: As directed    Discharge instructions   Complete by: As directed    Follow with PCP, patient should be seen by an MD in 1 to 2 days, continue monitor BP and titrate medications accordingly.  Repeat CBC and BMP in 1 week to check renal functions and hemoglobin. Follow-up with cardiology in 2 to 3 weeks for further management of paroxysmal A-fib and CHF, patient will need cardiac cath due to CHF and mitral valve regurgitation as an outpatient Follow GI for colonoscopy as an outpatient, return back to ED if any signs of bleeding Patient is on aspirin and low-dose Eliquis, if no bleeding and GI work-up is negative then cardiology planning to increase Eliquis from 2.5 to 5 mg p.o. twice daily full dose.   Increase activity slowly   Complete by: As directed    No wound care   Complete by: As directed        The results of significant diagnostics from this hospitalization (including imaging, microbiology, ancillary and laboratory) are listed below for reference.    Significant Diagnostic Studies: DG Chest 2 View  Result Date: 05/05/2021 CLINICAL DATA:  Shortness of breath.  Anxiety. EXAM: CHEST - 2 VIEW COMPARISON:  02/22/2020 FINDINGS: Stable cardiomediastinal contours. No signs of pleural effusion. No airspace consolidation. Increased reticular interstitial opacities are identified bilaterally. Findings may reflect mild interstitial edema, atypical infection or chronic lung disease. IMPRESSION: Increased reticular interstitial opacities are noted bilaterally with differential as discussed above.  Electronically Signed   By: Kerby Moors M.D.   On: 05/05/2021 16:47   DG Chest Port 1 View  Result Date: 05/09/2021 CLINICAL DATA:  Tachycardia EXAM: PORTABLE CHEST 1 VIEW COMPARISON:  05/08/2021, 02/22/2020 FINDINGS: Cardiomegaly with vascular congestion and diffuse interstitial  opacity probably mild edema. More focal airspace disease at left base. Aortic atherosclerosis. IMPRESSION: 1. Cardiomegaly with vascular congestion mild diffuse interstitial opacity most likely edema 2. Increasing airspace disease at left lung base could reflect superimposed pneumonia Electronically Signed   By: Donavan Foil M.D.   On: 05/09/2021 23:06   DG Chest Port 1 View  Result Date: 05/08/2021 CLINICAL DATA:  dyspnea EXAM: PORTABLE CHEST 1 VIEW COMPARISON:  05/05/2021 FINDINGS: Normal cardiac silhouette. Lungs are mildly hyperinflated. There is diffuse interstitial markings which are increased from 1 day prior. No focal consolidation. Chronic elevation LEFT hemidiaphragm with mild atelectasis IMPRESSION: Increased interstitial markings with rapid suggest interstitial edema. Electronically Signed   By: Suzy Bouchard M.D.   On: 05/08/2021 11:39   ECHOCARDIOGRAM COMPLETE  Result Date: 05/11/2021    ECHOCARDIOGRAM REPORT   Patient Name:   Jody Lowe Distler Date of Exam: 05/11/2021 Medical Rec #:  [id number]      Height:       66.0 in Accession #:    [id number]     Weight:       209.7 lb Date of Birth:  09-29-1945     BSA:          2.040 m Patient Age:    76 years       BP:           147/77 mmHg Patient Gender: F              HR:           83 bpm. Exam Location:  [facility] Procedure: 2D Echo, Cardiac Doppler and Color Doppler Indications:     Atrial Fibrillation I48.91  History:         Patient has prior history of Echocardiogram examinations, most                  recent 10/15/2020. Risk Factors:Hypertension. PAD, renal                  disorder.  Sonographer:     Sherrie Sport Referring Phys:  [id number] Val Riles Diagnosing Phys:  Ida Rogue MD  Sonographer Comments: Suboptimal parasternal window. IMPRESSIONS  1. Left ventricular ejection fraction, by estimation, is 35 to 40%. The left ventricle has moderately decreased function. The left ventricle demonstrates global hypokinesis. Left ventricular diastolic parameters are consistent with Grade II diastolic dysfunction (pseudonormalization).  2. Right ventricular systolic function is normal (unable to definatively exclude regional wall motion abnormality). The right ventricular size is normal. There is mildly elevated pulmonary artery systolic pressure. The estimated right ventricular systolic pressure is [id number] mmHg.  3. The mitral valve is normal in structure. Moderate to severe mitral valve regurgitation. No evidence of mitral stenosis.  4. The aortic valve is normal in structure. Aortic valve regurgitation is not visualized. No aortic stenosis is present.  5. The inferior vena cava is normal in size with greater than 50% respiratory variability, suggesting right atrial pressure of 3 mmHg.  6. Rhythm is normal sinus FINDINGS  Left Ventricle: Left ventricular ejection fraction, by estimation, is 35 to 40%. The left ventricle has moderately decreased function. The left ventricle demonstrates global hypokinesis. The left ventricular internal cavity size was normal in size. There is no left ventricular hypertrophy. Left ventricular diastolic parameters are consistent with Grade II diastolic dysfunction (pseudonormalization). Right Ventricle: The right ventricular size is normal. No increase in right ventricular wall thickness. Right ventricular systolic function is normal. There is mildly elevated pulmonary  artery systolic pressure. The tricuspid regurgitant velocity is 2.79  m/s, and with an assumed right atrial pressure of 5 mmHg, the estimated right ventricular systolic pressure is 36.1 mmHg. Left Atrium: Left atrial size was normal in size. Right Atrium: Right atrial size was normal in  size. Pericardium: There is no evidence of pericardial effusion. Mitral Valve: The mitral valve is normal in structure. Moderate to severe mitral valve regurgitation. No evidence of mitral valve stenosis. MV peak gradient, 4.2 mmHg. The mean mitral valve gradient is 2.0 mmHg. Tricuspid Valve: The tricuspid valve is normal in structure. Tricuspid valve regurgitation is mild . No evidence of tricuspid stenosis. Aortic Valve: The aortic valve is normal in structure. Aortic valve regurgitation is not visualized. No aortic stenosis is present. Aortic valve mean gradient measures 2.0 mmHg. Aortic valve peak gradient measures 3.7 mmHg. Aortic valve area, by VTI measures 2.04 cm. Pulmonic Valve: The pulmonic valve was normal in structure. Pulmonic valve regurgitation is not visualized. No evidence of pulmonic stenosis. Aorta: The aortic root is normal in size and structure. Venous: The inferior vena cava is normal in size with greater than 50% respiratory variability, suggesting right atrial pressure of 3 mmHg. IAS/Shunts: No atrial level shunt detected by color flow Doppler.  LEFT VENTRICLE PLAX 2D LVIDd:         4.90 cm      Diastology LVIDs:         4.00 cm      LV e' medial:    4.90 cm/s LV PW:         1.50 cm      LV E/e' medial:  23.5 LV IVS:        0.95 cm      LV e' lateral:   7.07 cm/s LVOT diam:     2.00 cm      LV E/e' lateral: 16.3 LV SV:         35 LV SV Index:   17 LVOT Area:     3.14 cm  LV Volumes (MOD) LV vol d, MOD A2C: 119.0 ml LV vol d, MOD A4C: 83.6 ml LV vol s, MOD A2C: 79.2 ml LV vol s, MOD A4C: 54.8 ml LV SV MOD A2C:     39.8 ml LV SV MOD A4C:     83.6 ml LV SV MOD BP:      37.3 ml RIGHT VENTRICLE RV Basal diam:  3.70 cm RV S prime:     10.90 cm/s TAPSE (M-mode): 2.2 cm LEFT ATRIUM             Index        RIGHT ATRIUM           Index LA diam:        4.00 cm 1.96 cm/m   RA Area:     16.70 cm LA Vol (A2C):   59.3 ml 29.06 ml/m  RA Volume:   44.30 ml  21.71 ml/m LA Vol (A4C):   51.7 ml 25.34  ml/m LA Biplane Vol: 58.9 ml 28.87 ml/m  AORTIC VALVE AV Area (Vmax):    1.65 cm AV Area (Vmean):   1.79 cm AV Area (VTI):     2.04 cm AV Vmax:           95.90 cm/s AV Vmean:          66.950 cm/s AV VTI:            0.171 m AV Peak Grad:  3.7 mmHg AV Mean Grad:      2.0 mmHg LVOT Vmax:         50.30 cm/s LVOT Vmean:        38.200 cm/s LVOT VTI:          0.111 m LVOT/AV VTI ratio: 0.65  AORTA Ao Root diam: 3.00 cm MITRAL VALVE                TRICUSPID VALVE MV Area (PHT): 5.46 cm     TR Peak grad:   31.1 mmHg MV Area VTI:   1.38 cm     TR Vmax:        279.00 cm/s MV Peak grad:  4.2 mmHg MV Mean grad:  2.0 mmHg     SHUNTS MV Vmax:       1.02 m/s     Systemic VTI:  0.11 m MV Vmean:      61.5 cm/s    Systemic Diam: 2.00 cm MV Decel Time: 139 msec MV E velocity: 115.00 cm/s MV A velocity: 67.70 cm/s MV E/A ratio:  1.70 Ida Rogue MD Electronically signed by Ida Rogue MD Signature Date/Time: 05/11/2021/8:14:26 AM    Final    US Abdomen Limited RUQ (LIVER/GB)  Result Date: 05/06/2021 CLINICAL DATA:  Elevated liver function test. History of a cholecystectomy. EXAM: ULTRASOUND ABDOMEN LIMITED RIGHT UPPER QUADRANT COMPARISON:  None. FINDINGS: Gallbladder: Surgically absent. Common bile duct: Diameter: 6 mm Liver: Mildly coarsened echotexture. Normal overall size. Subtle surface nodularity. No masses. Portal vein is patent on color Doppler imaging with normal direction of blood flow towards the liver. Other: Trace free fluid in the right upper quadrant. IMPRESSION: 1. No acute findings. 2. Liver appearance raises suspicion for cirrhosis.  No liver mass. 3. Trace ascites. Electronically Signed   By: Lajean Manes M.D.   On: 05/06/2021 16:39    Microbiology: Recent Results (from the past 240 hour(s))  Resp Panel by RT-PCR (Flu A&B, Covid) Nasopharyngeal Swab     Status: None   Collection Time: 05/05/21  5:30 PM   Specimen: Nasopharyngeal Swab; Nasopharyngeal(NP) swabs in vial transport medium  Result  Value Ref Range Status   SARS Coronavirus 2 by RT PCR NEGATIVE NEGATIVE Final    Comment: (NOTE) SARS-CoV-2 target nucleic acids are NOT DETECTED.  The SARS-CoV-2 RNA is generally detectable in upper respiratory specimens during the acute phase of infection. The lowest concentration of SARS-CoV-2 viral copies this assay can detect is 138 copies/mL. A negative result does not preclude SARS-Cov-2 infection and should not be used as the sole basis for treatment or other patient management decisions. A negative result may occur with  improper specimen collection/handling, submission of specimen other than nasopharyngeal swab, presence of viral mutation(s) within the areas targeted by this assay, and inadequate number of viral copies(<138 copies/mL). A negative result must be combined with clinical observations, patient history, and epidemiological information. The expected result is Negative.  Fact Sheet for Patients:  EntrepreneurPulse.com.au  Fact Sheet for Healthcare Providers:  IncredibleEmployment.be  This test is no t yet approved or cleared by the [place] FDA and  has been authorized for detection and/or diagnosis of SARS-CoV-2 by FDA under an Emergency Use Authorization (EUA). This EUA will remain  in effect (meaning this test can be used) for the duration of the COVID-19 declaration under Section 564(b)(1) of the Act, 21 U.S.C.section 360bbb-3(b)(1), unless the authorization is terminated  or revoked sooner.       Influenza A by  PCR NEGATIVE NEGATIVE Final   Influenza B by PCR NEGATIVE NEGATIVE Final    Comment: (NOTE) The Xpert Xpress SARS-CoV-2/FLU/RSV plus assay is intended as an aid in the diagnosis of influenza from Nasopharyngeal swab specimens and should not be used as a sole basis for treatment. Nasal washings and aspirates are unacceptable for Xpert Xpress SARS-CoV-2/FLU/RSV testing.  Fact Sheet for  Patients: EntrepreneurPulse.com.au  Fact Sheet for Healthcare Providers: IncredibleEmployment.be  This test is not yet approved or cleared by the [place] FDA and has been authorized for detection and/or diagnosis of SARS-CoV-2 by FDA under an Emergency Use Authorization (EUA). This EUA will remain in effect (meaning this test can be used) for the duration of the COVID-19 declaration under Section 564(b)(1) of the Act, 21 U.S.C. section 360bbb-3(b)(1), unless the authorization is terminated or revoked.  Performed at [facility], [address] [place], [place] [address]   MRSA Next Gen by PCR, Nasal     Status: None   Collection Time: 05/06/21 12:35 AM   Specimen: Nasal Mucosa; Nasal Swab  Result Value Ref Range Status   MRSA by PCR Next Gen NOT DETECTED NOT DETECTED Final    Comment: (NOTE) The GeneXpert MRSA Assay (FDA approved for NASAL specimens only), is one component of a comprehensive MRSA colonization surveillance program. It is not intended to diagnose MRSA infection nor to guide or monitor treatment for MRSA infections. Test performance is not FDA approved in patients less than 73 years old. Performed at [facility], [address] [place], [place] [address]   CULTURE, BLOOD (ROUTINE X 2) w Reflex to ID Panel     Status: None (Preliminary result)   Collection Time: 05/08/21 12:06 PM   Specimen: BLOOD  Result Value Ref Range Status   Specimen Description BLOOD LEFT HAND  Final   Special Requests   Final    BOTTLES DRAWN AEROBIC AND ANAEROBIC Blood Culture adequate volume   Culture   Final    NO GROWTH 4 DAYS Performed at [facility], [address] [place], [place] [address]    Report Status PENDING  Incomplete  CULTURE, BLOOD (ROUTINE X 2) w Reflex to ID Panel     Status: None (Preliminary result)   Collection Time: 05/08/21 12:06 PM   Specimen: BLOOD  Result Value Ref Range Status    Specimen Description BLOOD RIGHT HAND  Final   Special Requests   Final    BOTTLES DRAWN AEROBIC AND ANAEROBIC Blood Culture adequate volume   Culture   Final    NO GROWTH 4 DAYS Performed at [facility], [address] [place],  [address]    Report Status PENDING  Incomplete     Labs: CBC: Recent Labs  Lab 05/05/21 1634 05/06/21 0418 05/07/21 0318 05/08/21 0505 05/09/21 0420 05/10/21 0431 05/11/21 0506 05/12/21 0330  WBC 9.0   < > 18.3* 19.4* 13.3* 10.0 11.3* 9.8  NEUTROABS 7.4  --  15.5* 16.5* 11.0*  --   --   --   HGB 2.9*   < > 8.0* 8.4* 8.2* 8.6* 9.2* 8.4*  HCT 10.9*   < > 25.1* 26.8* 26.1* 27.5* 29.9* 27.0*  MCV 68.1*   < > 76.5* 77.5* 77.0* 77.5* 77.3* 76.7*  PLT 638*   < > 485* 486* 506* 537* 563* 490*   < > = values in this interval not displayed.   Basic Metabolic Panel: Recent Labs  Lab 05/05/21 1843 05/06/21 0418 05/08/21 0505 05/09/21  [id number] 05/10/21 0431 05/11/21 0506 05/12/21 0330  NA  --    < > 134* 132* 132* 134* 134*  K  --    < > 4.1 3.8 4.1 4.1 3.7  CL  --    < > 107 105 107 106 106  CO2  --    < > 23 20* 20* 21* 23  GLUCOSE  --    < > 99 98 110* 111* 94  BUN  --    < > 24* 23 24* 19 19  CREATININE  --    < > 1.25* 1.06* 1.11* 0.98 1.10*  CALCIUM  --    < > 8.9 8.9 9.2 9.6 9.1  MG 1.9   < > 2.2 1.9 1.7 1.5* 1.6*  PHOS 4.4  --   --   --  3.4 3.7 3.9   < > = values in this interval not displayed.   Liver Function Tests: Recent Labs  Lab 05/07/21 0318 05/09/21 0420 05/10/21 0431 05/11/21 0506 05/12/21 0330  AST 204* 31 19 14* 13*  ALT 252* 125* 87* 64* 44  ALKPHOS 192* 143* 128* 126 108  BILITOT 1.9* 0.8 0.7 0.6 0.5  PROT 6.5 6.4* 6.4* 6.6 5.9*  ALBUMIN 2.9* 2.6* 2.5* 2.6* 2.3*   No results for input(s): LIPASE, AMYLASE in the last 168 hours. No results for input(s): AMMONIA in the last 168 hours. Cardiac Enzymes: No results for input(s): CKTOTAL, CKMB, CKMBINDEX, TROPONINI in the last 168 hours. BNP (last 3  results) Recent Labs    05/09/21 2256  BNP 2,589.6*   CBG: Recent Labs  Lab 05/06/21 0008  GLUCAP 110*    Time spent: 35 minutes  Signed:  Val Riles  [facility]  05/12/2021 2:04 PM

## 2021-05-12 NOTE — Progress Notes (Signed)
Progress Note  Patient Name: Jody Lowe Date of Encounter: 05/12/2021  Primary Cardiologist: Nelva Bush, MD  Subjective   No recurrent chest tightness.  No recurrent aflutter.  Denies dyspnea.  Inpatient Medications    Scheduled Meds:  amiodarone  200 mg Oral BID   amitriptyline  25 mg Oral QHS   apixaban  2.5 mg Oral BID   vitamin C  500 mg Oral Daily   aspirin EC  81 mg Oral Daily   cholecalciferol  1,000 Units Oral Daily   famotidine  20 mg Oral Daily   feeding supplement  237 mL Oral TID BM   FLUoxetine  80 mg Oral Daily   folic acid  1 mg Oral Daily   furosemide  40 mg Intravenous Daily   [START ON 05/14/2021] iron polysaccharides  150 mg Oral Daily   magnesium oxide  400 mg Oral BID   mouth rinse  15 mL Mouth Rinse BID   metoprolol succinate  50 mg Oral Daily   multivitamin with minerals  1 tablet Oral Daily   oxybutynin  10 mg Oral Daily   Continuous Infusions:  iron sucrose 200 mg (05/11/21 1542)   PRN Meds: albuterol, diclofenac Sodium, fluticasone, lip balm, meclizine, ondansetron **OR** ondansetron (ZOFRAN) IV, polyethylene glycol, senna-docusate   Vital Signs    Vitals:   05/11/21 1500 05/11/21 2014 05/12/21 0500 05/12/21 0732  BP:  124/66 127/89 (!) 116/50  Pulse: 74 79 80 70  Resp:  18 20 18   Temp:  97.7 F (36.5 C) 98 F (36.7 C) 98.5 F (36.9 C)  TempSrc:   Oral   SpO2:  99% 99% 98%  Weight:   92 kg   Height:        Intake/Output Summary (Last 24 hours) at 05/12/2021 0809 Last data filed at 05/12/2021 0400 Gross per 24 hour  Intake 360 ml  Output 1400 ml  Net -1040 ml   Filed Weights   05/05/21 1547 05/10/21 0300 05/12/21 0500  Weight: 90.7 kg 95.1 kg 92 kg    Physical Exam   GEN: Well nourished, well developed, in no acute distress.  HEENT: Grossly normal.  Neck: Supple, no JVD, carotid bruits, or masses. Cardiac: RRR, 1/6 syst murmur @ apex, no rubs, or gallops. No clubbing, cyanosis, edema.  Radials 2+.    Respiratory:  Respirations regular and unlabored, diminished breath sounds bilat. GI: Obese, soft, nontender, nondistended, BS + x 4. MS: no deformity or atrophy. Skin: warm and dry, no rash. Neuro:  Strength and sensation are intact. Psych: AAOx3.  Normal affect.  Labs    Chemistry Recent Labs  Lab 05/10/21 0431 05/11/21 0506 05/12/21 0330  NA 132* 134* 134*  K 4.1 4.1 3.7  CL 107 106 106  CO2 20* 21* 23  GLUCOSE 110* 111* 94  BUN 24* 19 19  CREATININE 1.11* 0.98 1.10*  CALCIUM 9.2 9.6 9.1  PROT 6.4* 6.6 5.9*  ALBUMIN 2.5* 2.6* 2.3*  AST 19 14* 13*  ALT 87* 64* 44  ALKPHOS 128* 126 108  BILITOT 0.7 0.6 0.5  GFRNONAA 51* 59* 52*  ANIONGAP 5 7 5      Hematology Recent Labs  Lab 05/10/21 0431 05/11/21 0506 05/12/21 0330  WBC 10.0 11.3* 9.8  RBC 3.55* 3.87 3.52*  HGB 8.6* 9.2* 8.4*  HCT 27.5* 29.9* 27.0*  MCV 77.5* 77.3* 76.7*  MCH 24.2* 23.8* 23.9*  MCHC 31.3 30.8 31.1  RDW 22.5* 22.7* 22.9*  PLT 537* 563* 490*  Cardiac Enzymes  Recent Labs  Lab 05/05/21 1634 05/05/21 1800 05/09/21 1816 05/09/21 1942  TROPONINIHS 475* 396* 63* 65*      BNP Recent Labs  Lab 05/09/21 2256  BNP 2,589.6*     Lipids  Lab Results  Component Value Date   CHOL 89 10/16/2020   HDL 40 (L) 10/16/2020   LDLCALC 41 10/16/2020   TRIG 42 10/16/2020   CHOLHDL 2.2 10/16/2020    HbA1c  Lab Results  Component Value Date   HGBA1C 5.8 (H) 10/14/2020   Lab Results  Component Value Date   TSH 1.318 05/11/2021     Radiology    DG Chest Port 1 View  Result Date: 05/09/2021 CLINICAL DATA:  Tachycardia EXAM: PORTABLE CHEST 1 VIEW COMPARISON:  05/08/2021, 02/22/2020 FINDINGS: Cardiomegaly with vascular congestion and diffuse interstitial opacity probably mild edema. More focal airspace disease at left base. Aortic atherosclerosis. IMPRESSION: 1. Cardiomegaly with vascular congestion mild diffuse interstitial opacity most likely edema 2. Increasing airspace disease at  left lung base could reflect superimposed pneumonia Electronically Signed   By: Donavan Foil M.D.   On: 05/09/2021 23:06   DG Chest Port 1 View  Result Date: 05/08/2021 CLINICAL DATA:  dyspnea EXAM: PORTABLE CHEST 1 VIEW COMPARISON:  05/05/2021 FINDINGS: Normal cardiac silhouette. Lungs are mildly hyperinflated. There is diffuse interstitial markings which are increased from 1 day prior. No focal consolidation. Chronic elevation LEFT hemidiaphragm with mild atelectasis IMPRESSION: Increased interstitial markings with rapid suggest interstitial edema. Electronically Signed   By: Suzy Bouchard M.D.   On: 05/08/2021 11:39   Telemetry    RSR - Personally Reviewed  Cardiac Studies   2D Echocardiogram 02.16.2023  1. Left ventricular ejection fraction, by estimation, is 35 to 40%. The  left ventricle has moderately decreased function. The left ventricle  demonstrates global hypokinesis. Left ventricular diastolic parameters are  consistent with Grade II diastolic  dysfunction (pseudonormalization).   2. Right ventricular systolic function is normal (unable to definatively  exclude regional wall motion abnormality). The right ventricular size is  normal. There is mildly elevated pulmonary artery systolic pressure. The  estimated right ventricular  systolic pressure is 99991111 mmHg.   3. The mitral valve is normal in structure. Moderate to severe mitral  valve regurgitation. No evidence of mitral stenosis.   4. The aortic valve is normal in structure. Aortic valve regurgitation is  not visualized. No aortic stenosis is present.   5. The inferior vena cava is normal in size with greater than 50%  respiratory variability, suggesting right atrial pressure of 3 mmHg.   6. Rhythm is normal sinus  _____________   Patient Profile      76 y.o. female with a history of PAD, HTN, HL, obesity, TIA, iron def anemia, CKD III, tobacco abuse, and angina w/ low risk stress test in 11/2020, who was admitted  2/11 w/ profound anemia (2.9/10.9) and melena, developed paroxysmal Aflutter w/ RVR on 2/15, and has been found to have new LV dysfxn (EF 35-40%) w/ mod-severe MR.  Assessment & Plan    1.  Paroxysmal Atrial Flutter:  Admitted 2/11 w/ profound anemia.  Developed rapid aflutter into the 210 range on evening of 2/15.  Rates improved but remained in flutter x 4.5 hrs before breaking.  She did have chest tightness during episode.  Recurrent PAFlutter during early morning hours of 3/17.  Placed on amio PO on 2/17  No recurrent aflutter overnight.  With comorbidities, high risk for recurrent atrial  arrhythmias.  Cont amio 200mg  bid x 2 wks w/ plan to drop to 200mg  daily after that.  CHA2DS2VASc = 7.  She has been on eliquis 2.5mg  BID in the outpt setting due to PAD.  Though she is under-dosed from the standpoint of thromboembolism prevention in afib, with significant anemia on presentation, we are reluctant to advance eliquis dose at this time and have left her on 2.5mg  BID.  TSH nl.  Supp Mg, which remains low.  Needs outpt sleep study.  2.  Cardiomyopathy:   Echo in July showed normal LV function however repeat echo on February 16 shows an EF of 35 to 40% with grade 2 diastolic dysfunction, and moderate to severe mitral regurgitation.  RVSP was elevated at 36.1 mmHg.  Her BNP was also elevated and cxr showed interstitial edema.  Given lasix 40mg  IV x 1 on 2/17, and was minus 1L.  Still net + for admission. Body habitus makes exam challenging, but does not appear markedly volume overloaded.  Creat up slightly @ 1.10, though overall stable.  Changed to lasix 20mg  po daily.  Cont toprol xl.  Will add low-dose losartan.  Will need R & L heart cath in outpt setting following recovery.  3.  Moderate to severe MR:  Noted on echo in setting of LV dysfxn. As above, plan for outpt f/u and eventual R&L heart cath.  4.  Acute on chronic IDA/melena:  Patient with a long history of melena.  Admitted with a hemoglobin of 2.9  and hematocrit of 10.9.  Status post transfusions.  H&H have been stable on Eliquis and H2 blocker therapy.  EGD normal.  Colonoscopy aborted secondary to poor prep.  Prior history of AVMs.  Management per medicine and GI.  5.  Essential HTN:  Stable overnight.  Adding low-dose ARB in setting of cardiomyopathy.  6.  PAD:  Status post left-sided intervention in July with right femoropopliteal bypass in October.  On aspirin and low-dose Eliquis.  7.  HL:  On statin in outpatient setting though this has been on hold in the setting of elevated LFTs.  Plan to resume once stable.  8.  CKD III: Relatively stable.  9.  Tob Abuse:  Quit Fall 2022.  10.  Hypomagnesemia:  Only sl better this AM @ 1.6.  Supp ordered.  Signed, Murray Hodgkins, NP  05/12/2021, 8:09 AM    For questions or updates, please contact   Please consult [email or web address] for contact info under Cardiology/STEMI.

## 2021-05-12 NOTE — TOC Transition Note (Signed)
Transition of Care [facility]) - CM/SW Discharge Note   Patient Details  Name: Jody Lowe MRN: [id number] Date of Birth: 1945/07/22  [facility] CM/SW Contact:  Bing Quarry, RN Phone Number: 05/12/2021, 1:02 PM   Clinical Narrative:  2/18 Discharging from hospital to [facility]. Discharge summary inboxed, via destination hub, to Tammy/AC at [facility]. EMS forms printed to unit for RN/EMS packet. Patient is Full Code. No special needs for transport. Going to room 805.  Updated Unit RN and transport via [facility] arranged. Tammy/AC aware of pending discharge/transfer to facility today, 05/12/21. Gabriel Cirri RN CM     Final next level of care: Skilled Nursing Facility Barriers to Discharge: Barriers Resolved   Patient Goals and CMS Choice        Discharge Placement                       Discharge Plan and Services                                     Social Determinants of Health (SDOH) Interventions     Readmission Risk Interventions Readmission Risk Prevention Plan 01/05/2021  Transportation Screening Complete  PCP or Specialist Appt within 3-5 Days Complete  HRI or Home Care Consult Complete  Social Work Consult for Recovery Care Planning/Counseling Complete  Palliative Care Screening Not Applicable  Medication Review [employer]) Complete  Some recent data might be hidden

## 2021-05-12 NOTE — Progress Notes (Signed)
Report called to Kathrin Ruddy, LPN @ [facility] no questions at this time. Patient awaiting EMS transport to facility .

## 2021-05-13 LAB — CULTURE, BLOOD (ROUTINE X 2)
Culture: NO GROWTH
Culture: NO GROWTH
Special Requests: ADEQUATE
Special Requests: ADEQUATE

## 2021-05-21 ENCOUNTER — Ambulatory Visit: Payer: Medicare Other | Admitting: Physician Assistant

## 2021-05-22 ENCOUNTER — Encounter (INDEPENDENT_AMBULATORY_CARE_PROVIDER_SITE_OTHER): Payer: Self-pay

## 2021-05-22 ENCOUNTER — Encounter (INDEPENDENT_AMBULATORY_CARE_PROVIDER_SITE_OTHER): Payer: Medicare Other

## 2021-05-22 ENCOUNTER — Ambulatory Visit (INDEPENDENT_AMBULATORY_CARE_PROVIDER_SITE_OTHER): Payer: Medicare Other | Admitting: Vascular Surgery

## 2021-05-23 DEATH — deceased

## 2022-08-21 IMAGING — US US CAROTID DUPLEX BILAT
1 series · 13 of 24 positions shown · non-contrast
Comparison: 05/01/2019

CLINICAL DATA: Syncope

EXAM:
BILATERAL CAROTID DUPLEX ULTRASOUND
TECHNIQUE: Gray scale imaging, color Doppler and duplex ultrasound were
performed of bilateral carotid and vertebral arteries in the neck.

[Series 1: us carotid bilateral · 13 of 61 slices shown]
[im 1/61]
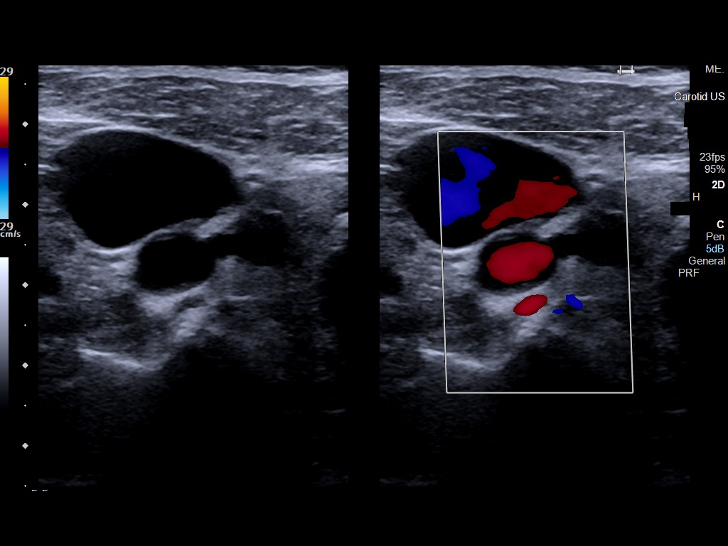
[im 6/61]
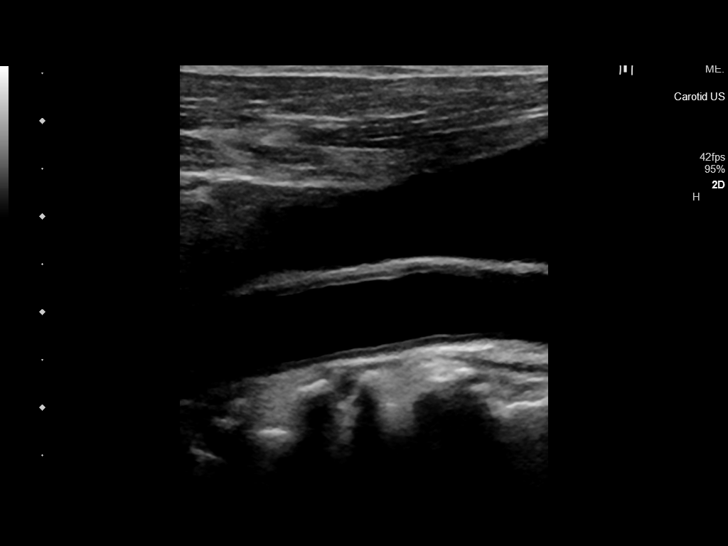
[im 11/61]
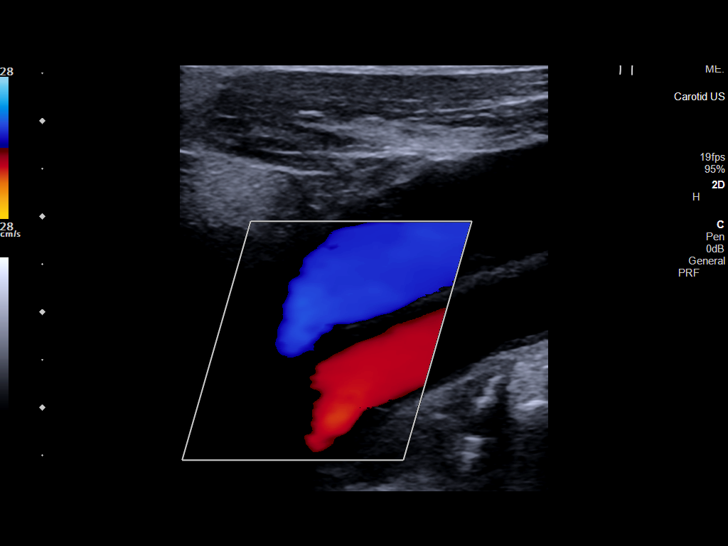
[im 16/61]
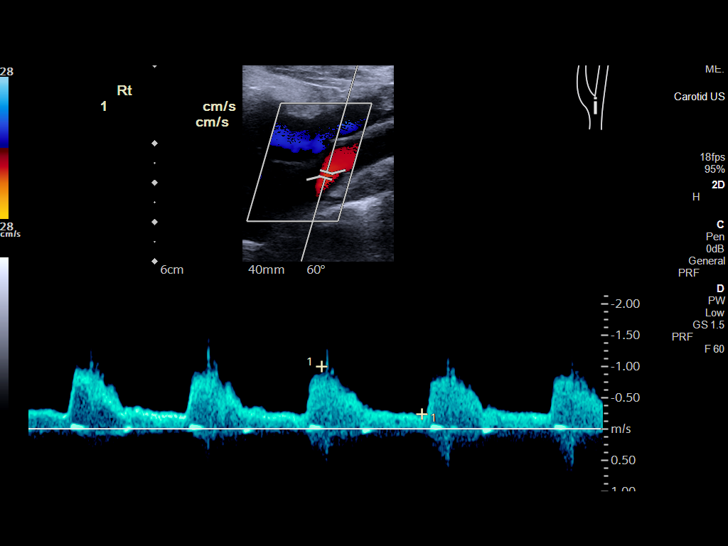
[im 21/61]
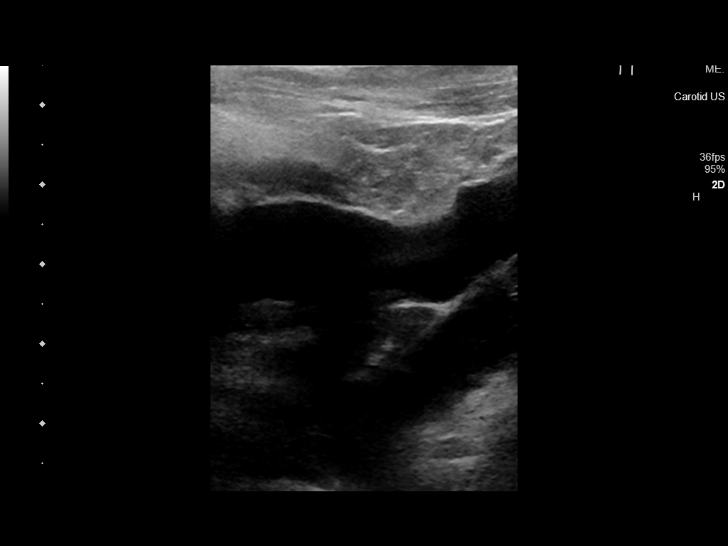
[im 27/61]
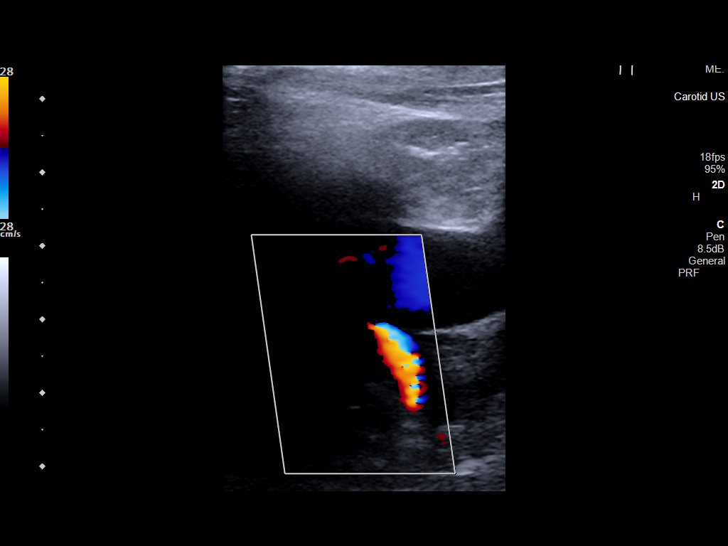
[im 32/61]
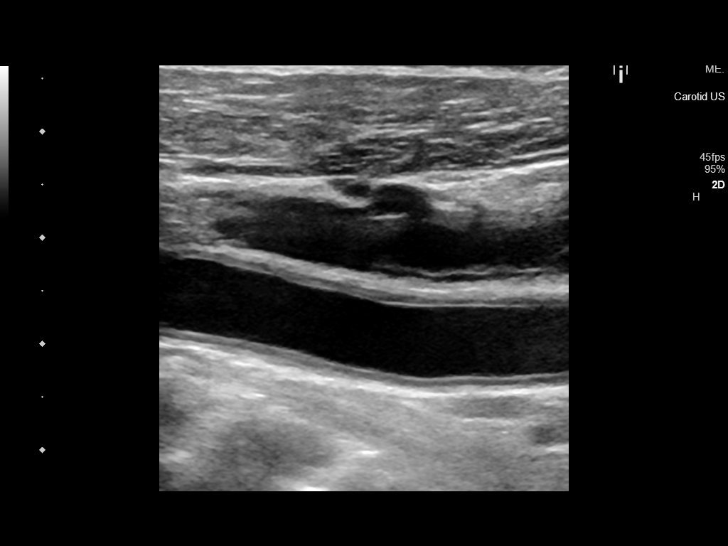
[im 34/61]
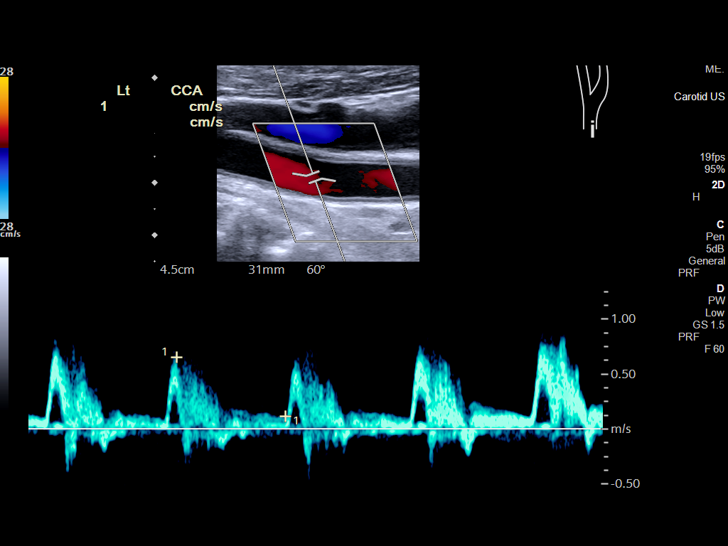
[im 40/61]
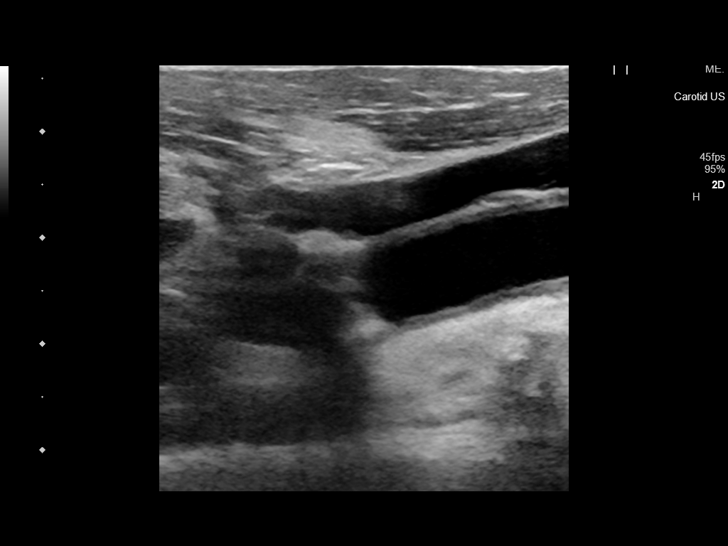
[im 45/61]
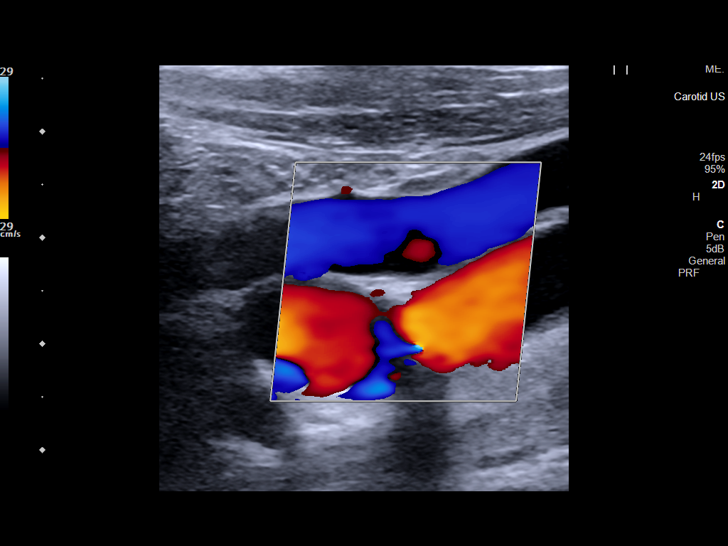
[im 50/61]
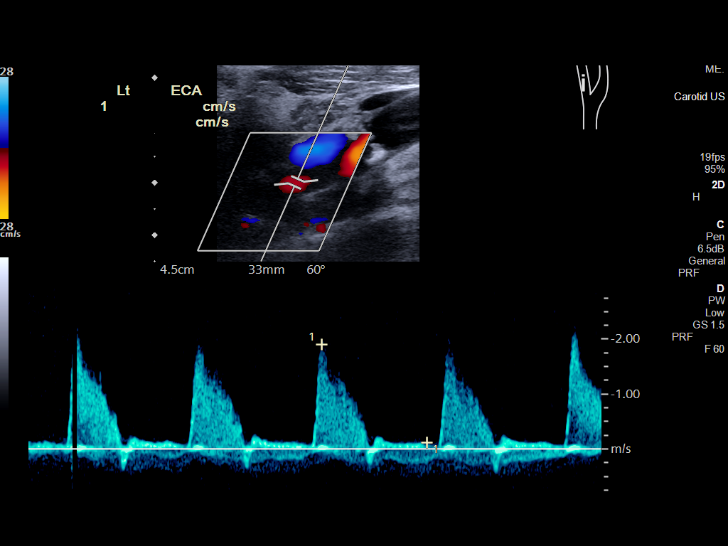
[im 55/61]
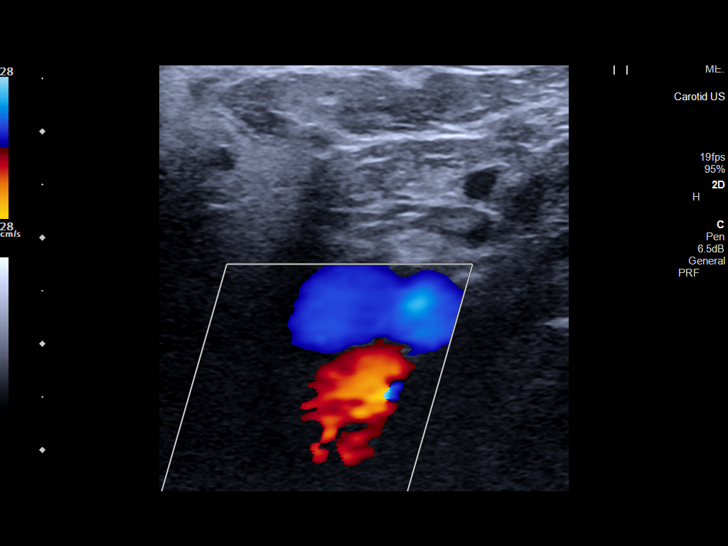
[im 61/61]
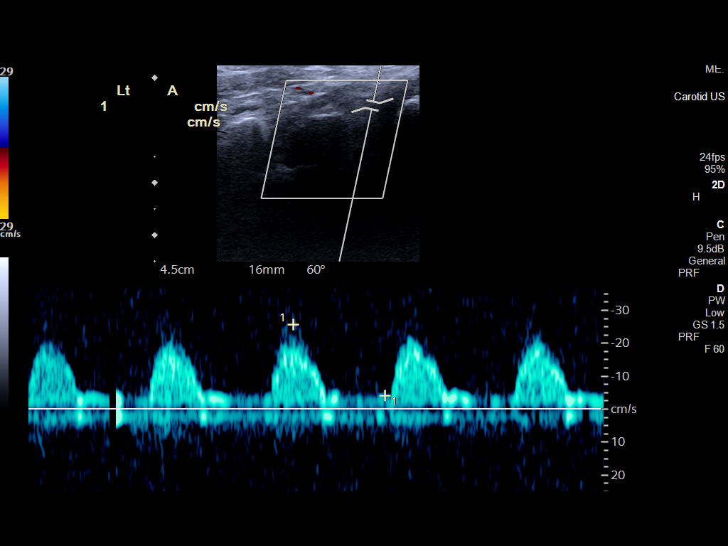

[13 of 24 positions shown; findings below may reference images not displayed]

FINDINGS: Criteria: Quantification of carotid stenosis is based on velocity
parameters that correlate the residual internal carotid diameter
with NASCET-based stenosis levels, using the diameter of the distal
internal carotid lumen as the denominator for stenosis measurement.

The following velocity measurements were obtained:

RIGHT

ICA: 158 cm/sec

CCA: 93 cm/sec

SYSTOLIC ICA/CCA RATIO:

ECA: 52 cm/sec

LEFT

ICA: 195 cm/sec

CCA: 82 cm/sec

SYSTOLIC ICA/CCA RATIO:

ECA: 190 cm/sec

RIGHT CAROTID ARTERY: Mild atherosclerosis at the carotid
bifurcation. Normal waveform.

RIGHT VERTEBRAL ARTERY:  Antegrade flow with normal waveform

LEFT CAROTID ARTERY: Moderate atherosclerosis, predominantly
affecting the ICA origin, but also narrowing the proximal ECA.

LEFT VERTEBRAL ARTERY:  Antegrade flow with normal waveform
IMPRESSION: 1. Estimated 50-69% stenosis of both proximal internal carotid
arteries, progressed since 05/01/2019.
[DATE]. Antegrade flow in the vertebral arteries.

## 2022-08-21 IMAGING — CT CT HEAD W/O CM
3 series · 15 of 47 positions shown, 18 images · non-contrast
Comparison: CT head dated 09/20/2019.

CLINICAL DATA: Fall with head and neck pain.

EXAM:
CT HEAD WITHOUT CONTRAST
CT CERVICAL SPINE WITHOUT CONTRAST
TECHNIQUE: Multidetector CT imaging of the head and cervical spine was
performed following the standard protocol without intravenous
contrast. Multiplanar CT image reconstructions of the cervical spine
were also generated.

[Series 2: head wo · axial · 0.44mm/px · z∈[+302,+432]mm · 9 of 32 slices shown, 12 images]
[im 3/32  brain]
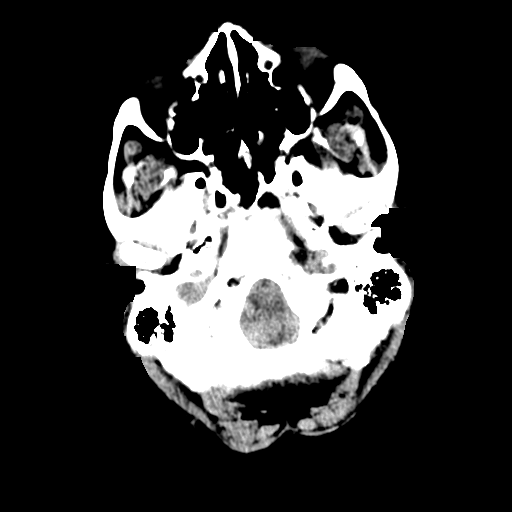
[im 3/32  bone]
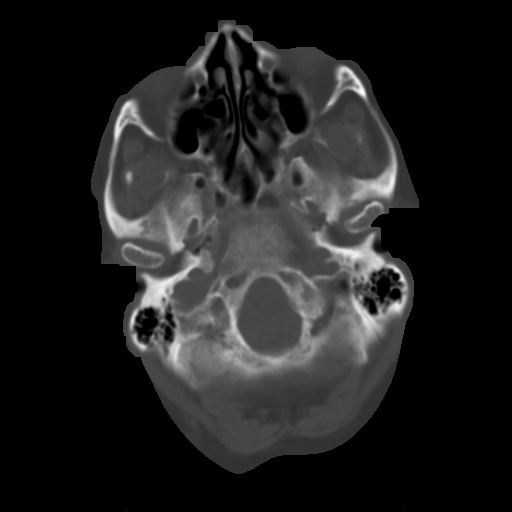
[im 6/32  brain]
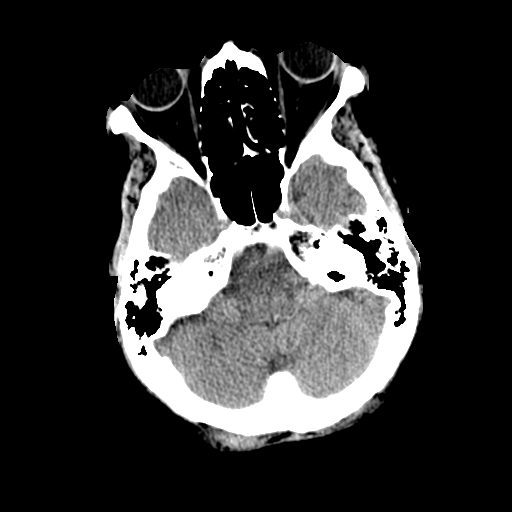
[im 9/32  brain]
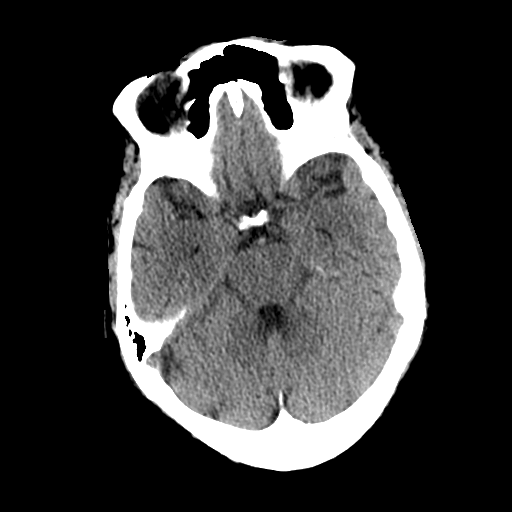
[im 12/32  brain]
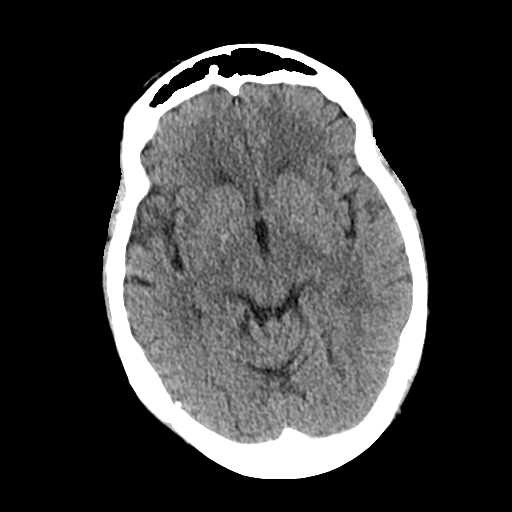
[im 17/32  brain]
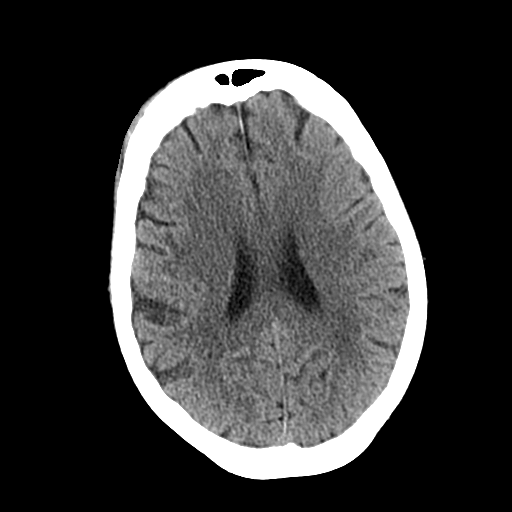
[im 17/32  bone]
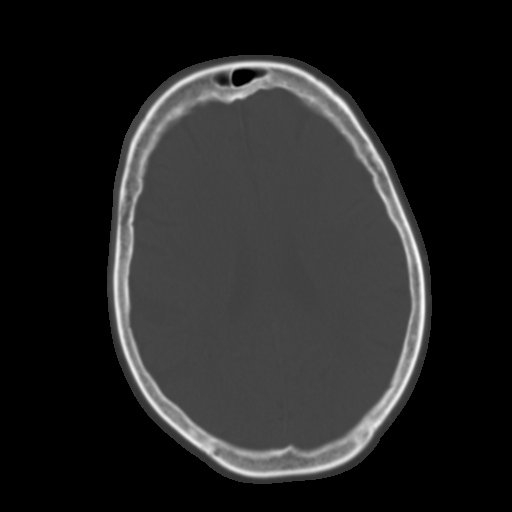
[im 20/32  brain]
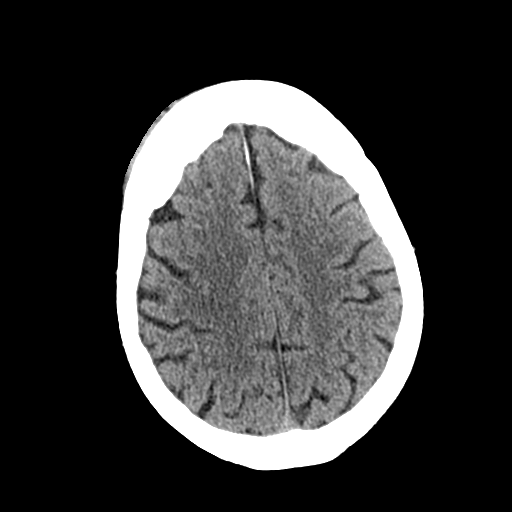
[im 23/32  brain]
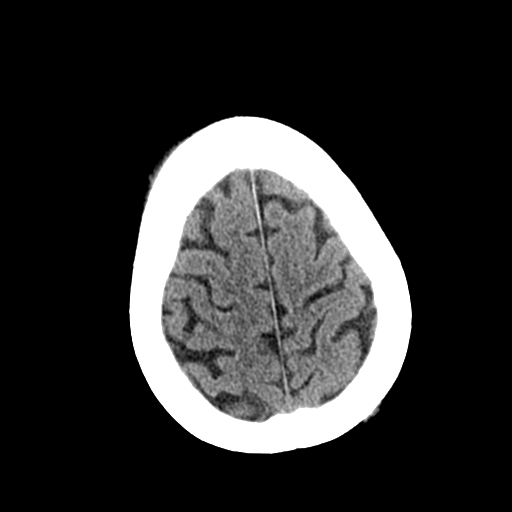
[im 26/32  brain]
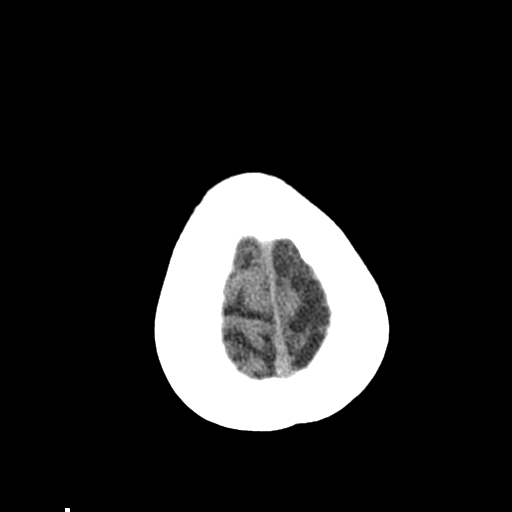
[im 29/32  brain]
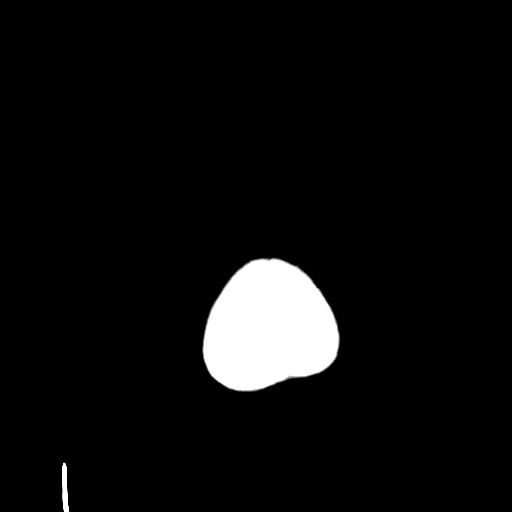
[im 29/32  bone]
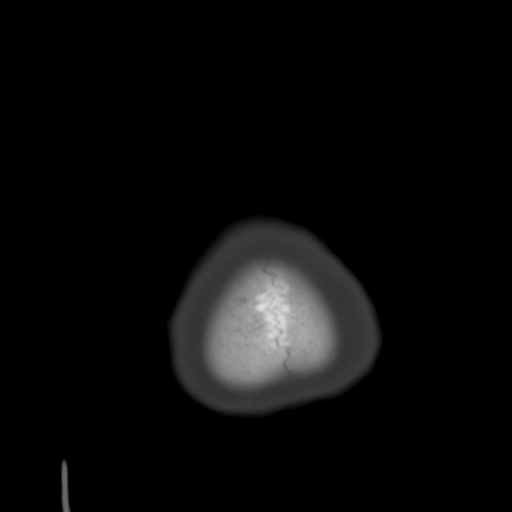

[Series 4: coronal soft tissue · coronal · 0.32mm/px · 3 of 75 slices shown]
[im 25/75  brain]
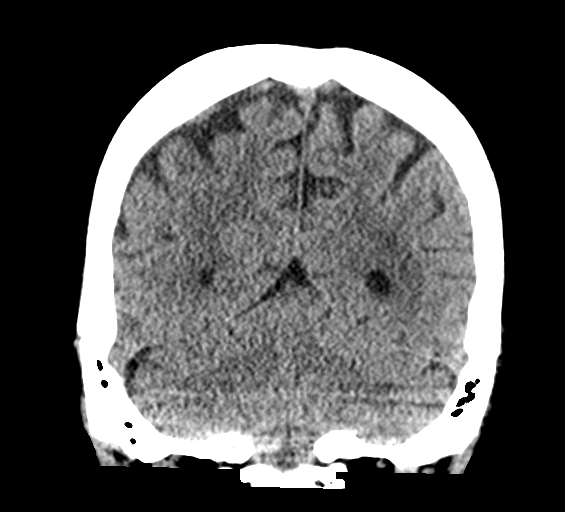
[im 33/75  brain]
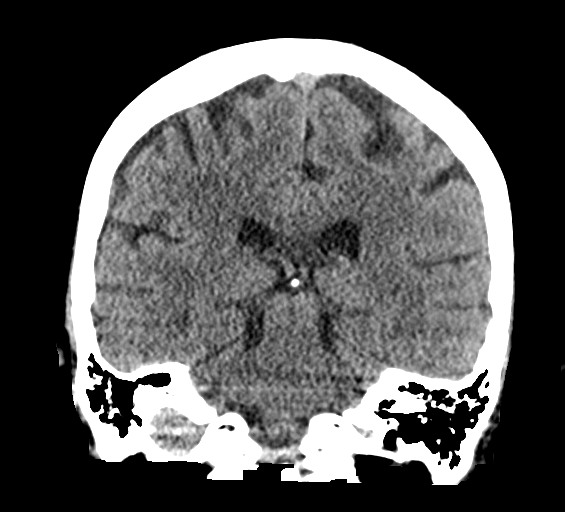
[im 42/75  brain]
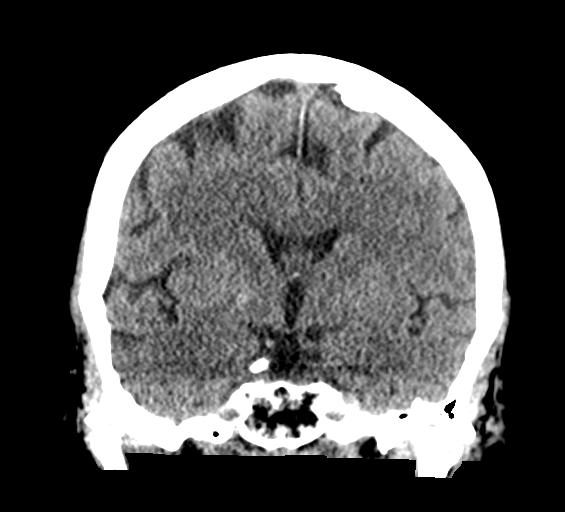

[Series 5: sagittal soft tissue · sagittal · 0.33mm/px · 3 of 61 slices shown]
[im 21/61  brain]
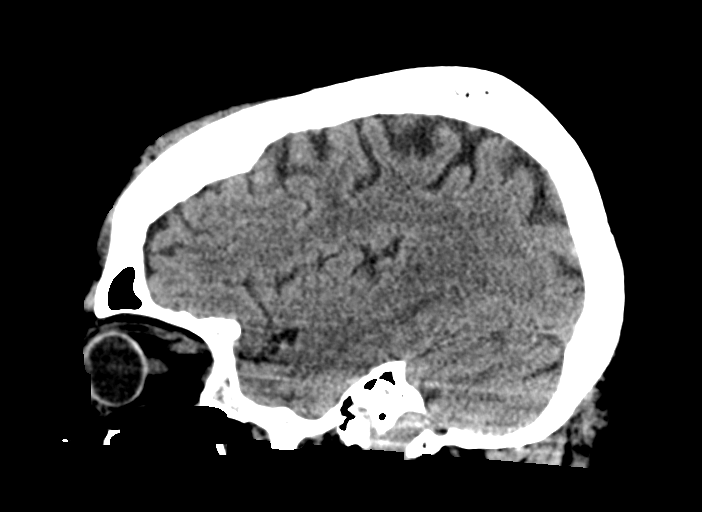
[im 31/61  brain]
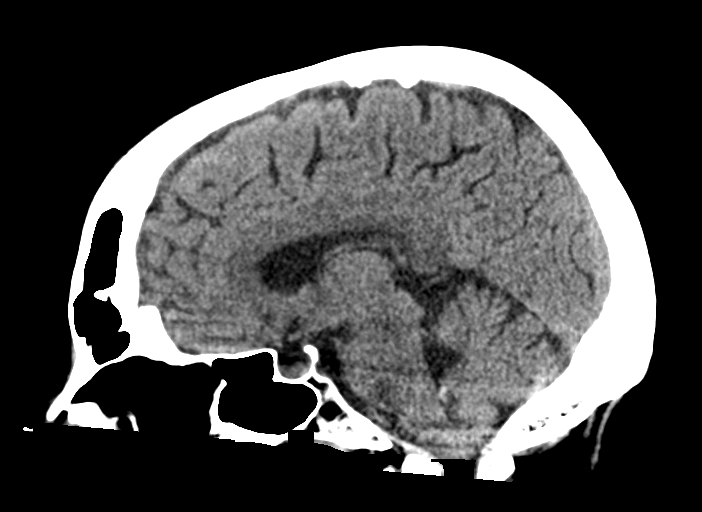
[im 41/61  brain]
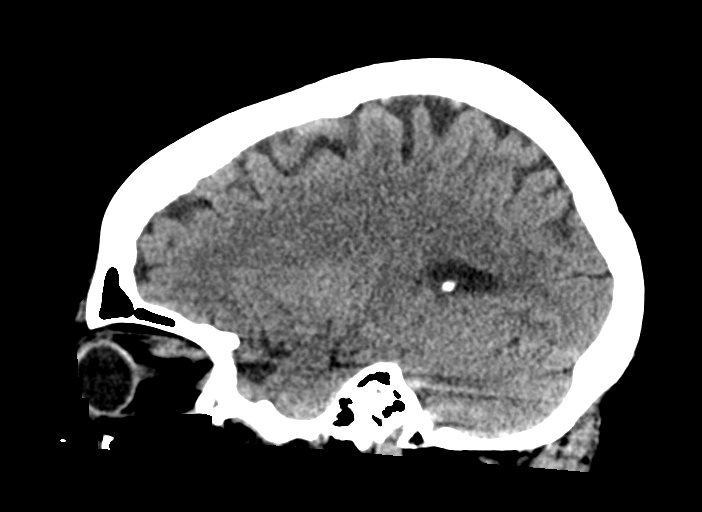

[15 of 47 positions shown; findings below may reference images not displayed]

FINDINGS: CT HEAD FINDINGS

Brain: No evidence of acute infarction, hemorrhage, hydrocephalus,
extra-axial collection or mass lesion/mass effect. Periventricular
white matter hypoattenuation likely represents chronic small vessel
ischemic disease.

Vascular: There are vascular calcifications in the carotid siphons.

Skull: Normal. Negative for fracture or focal lesion.

Sinuses/Orbits: No acute finding.

Other: There is soft tissue swelling overlying the right forehead
and frontal bone.

CT CERVICAL SPINE FINDINGS

Alignment: Normal.

Skull base and vertebrae: No acute fracture. No primary bone lesion
or focal pathologic process.

Soft tissues and spinal canal: No prevertebral fluid or swelling. No
visible canal hematoma.

Disc levels: Up to moderate to severe multilevel degenerative disc
and joint disease.

Upper chest: Negative.

Other: None.
IMPRESSION: 1. No acute intracranial process.
2. No acute osseous injury in the cervical spine.

## 2022-08-22 IMAGING — DX DG KNEE 1-2V*R*
2 series · 2 of 2 positions shown · non-contrast
Comparison: None.

CLINICAL DATA: Chronic knee pain.

EXAM:
RIGHT KNEE - 1-2 VIEW

[knee ap]
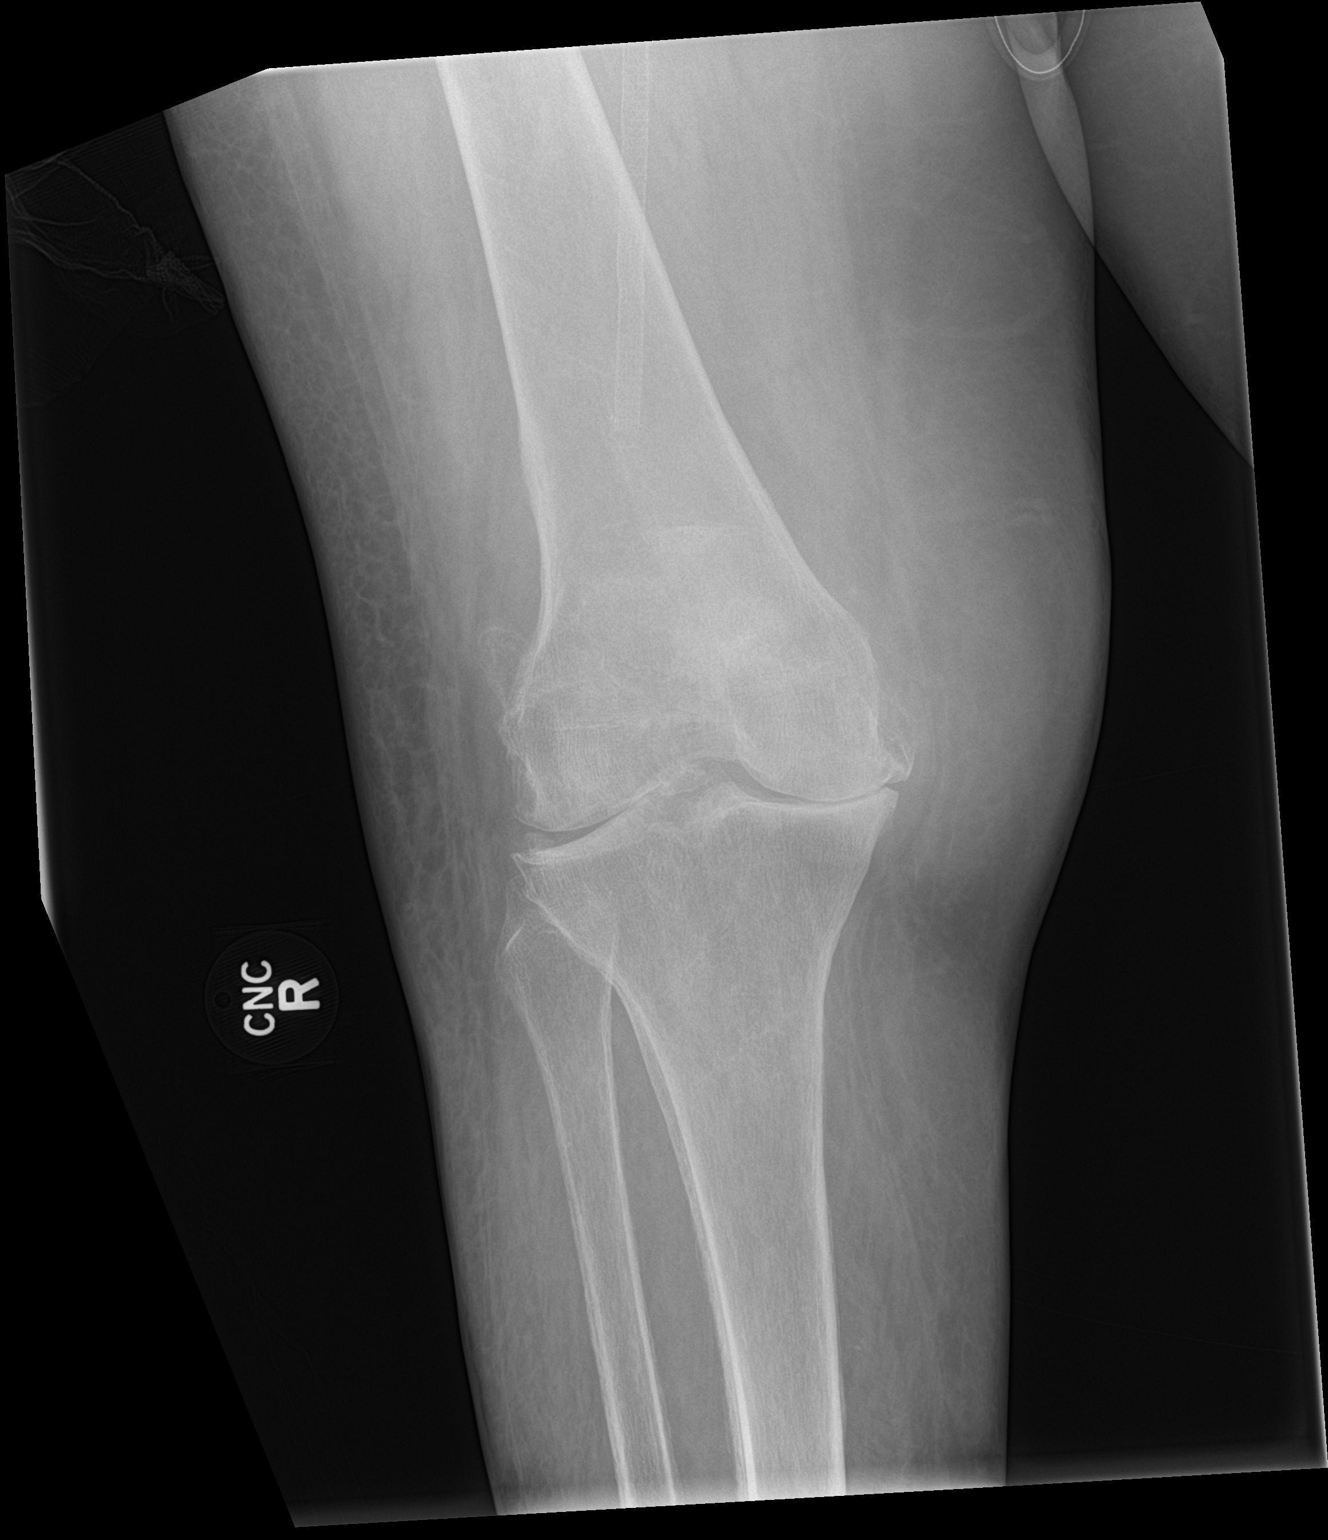

[knee lat]
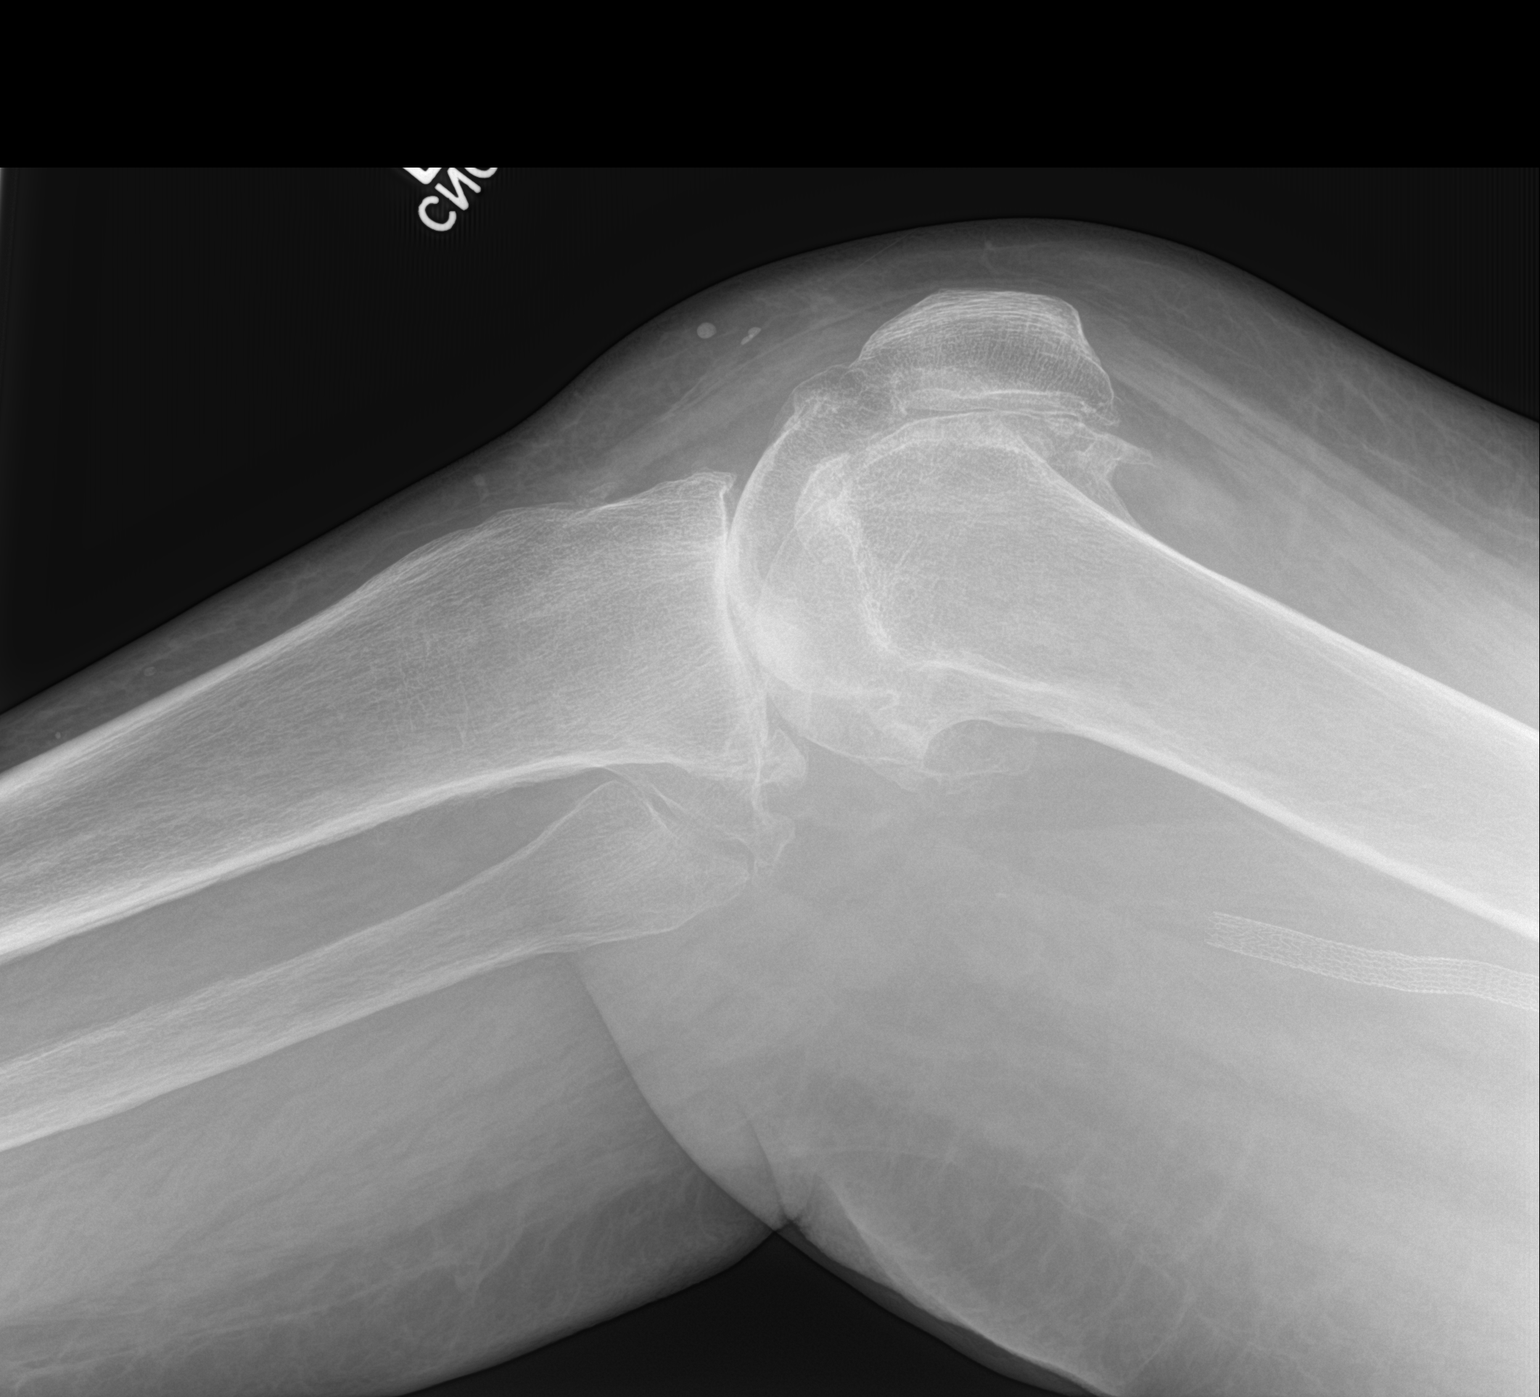

[2 of 2 positions shown; findings below may reference images not displayed]

FINDINGS: Two-view exam shows no evidence for an acute fracture. Loss of joint
space noted in both the medial and lateral compartments, medial
compartment more affected. Hypertrophic spurring in all 3
compartments is moderate. Possible small joint effusion. Vascular
stent device noted lower thigh.
IMPRESSION: 1. Tricompartmental degenerative changes without acute bony
abnormality.
2. Possible small joint effusion.
